# Patient Record
Sex: Female | Born: 1990 | Race: Black or African American | Hispanic: No | Marital: Single | Smoking: Current every day smoker
Health system: Southern US, Community
[De-identification: ages and names within clinical notes are randomized; demographics above are authoritative.]

## PROBLEM LIST (undated history)

## (undated) DIAGNOSIS — F319 Bipolar disorder, unspecified: Secondary | ICD-10-CM

## (undated) DIAGNOSIS — A749 Chlamydial infection, unspecified: Secondary | ICD-10-CM

## (undated) DIAGNOSIS — A549 Gonococcal infection, unspecified: Secondary | ICD-10-CM

## (undated) HISTORY — PX: NO PAST SURGERIES: SHX2092

---

## 2013-10-27 ENCOUNTER — Encounter (HOSPITAL_COMMUNITY): Payer: Self-pay | Admitting: *Deleted

## 2013-10-27 ENCOUNTER — Inpatient Hospital Stay (HOSPITAL_COMMUNITY)
Admission: AD | Admit: 2013-10-27 | Discharge: 2013-10-28 | Disposition: A | Discharge status: Home / Self Care | Payer: Medicaid Other | Source: Ambulatory Visit | Attending: Family Medicine | Admitting: Family Medicine

## 2013-10-27 DIAGNOSIS — O039 Complete or unspecified spontaneous abortion without complication: Secondary | ICD-10-CM | POA: Insufficient documentation

## 2013-10-27 DIAGNOSIS — F17200 Nicotine dependence, unspecified, uncomplicated: Secondary | ICD-10-CM | POA: Insufficient documentation

## 2013-10-27 HISTORY — DX: Gonococcal infection, unspecified: A54.9

## 2013-10-27 HISTORY — DX: Chlamydial infection, unspecified: A74.9

## 2013-10-27 LAB — CBC
HCT: 34.6 % — ABNORMAL LOW (ref 36.0–46.0)
Hemoglobin: 11.7 g/dL — ABNORMAL LOW (ref 12.0–15.0)
MCH: 31.3 pg (ref 26.0–34.0)
MCHC: 33.8 g/dL (ref 30.0–36.0)
MCV: 92.5 fL (ref 78.0–100.0)
PLATELETS: 242 10*3/uL (ref 150–400)
RBC: 3.74 MIL/uL — ABNORMAL LOW (ref 3.87–5.11)
RDW: 13.7 % (ref 11.5–15.5)
WBC: 5.1 10*3/uL (ref 4.0–10.5)

## 2013-10-27 LAB — URINE MICROSCOPIC-ADD ON

## 2013-10-27 LAB — URINALYSIS, ROUTINE W REFLEX MICROSCOPIC
Bilirubin Urine: NEGATIVE
Glucose, UA: NEGATIVE mg/dL
KETONES UR: NEGATIVE mg/dL
LEUKOCYTES UA: NEGATIVE
Nitrite: NEGATIVE
PROTEIN: NEGATIVE mg/dL
Specific Gravity, Urine: >1.03 — ABNORMAL HIGH (ref 1.005–1.030)
UROBILINOGEN UA: 0.2 mg/dL (ref 0.0–1.0)
pH: 6 (ref 5.0–8.0)

## 2013-10-27 LAB — HCG, QUANTITATIVE, PREGNANCY: HCG, BETA CHAIN, QUANT, S: 6 m[IU]/mL — AB (ref <5)

## 2013-10-27 NOTE — MAU Provider Note (Signed)
Chief Complaint: Possible Pregnancy   None    SUBJECTIVE HPI: Lauren Poole is a 23 y.o. G0P0 at Unknown by LMP who presents to maternity admissions reporting positive pregnancy tests at home x3 with last one 2 weeks ago and onset of bleeding 1.5 weeks ago which has now resolved. She reported menstrual-like cramping for the first 2 days of bleeding but denies pain or bleeding today.  She denies vaginal itching/burning, urinary symptoms, h/a, dizziness, n/v, or fever/chills.     Past Medical History  Diagnosis Date  . Chlamydia   . Gonorrhea    Past Surgical History  Procedure Laterality Date  . No past surgeries     History   Social History  . Marital Status: Single    Spouse Name: N/A    Number of Children: N/A  . Years of Education: N/A   Occupational History  . Not on file.   Social History Main Topics  . Smoking status: Light Tobacco Smoker  . Smokeless tobacco: Not on file  . Alcohol Use: Yes     Comment: not since pregnant  . Drug Use: No  . Sexual Activity: Yes    Birth Control/ Protection: None   Other Topics Concern  . Not on file   Social History Narrative  . No narrative on file   No current facility-administered medications on file prior to encounter.   No current outpatient prescriptions on file prior to encounter.   Allergies not on file  ROS: Pertinent items in HPI  OBJECTIVE Blood pressure 119/83, pulse 74, temperature 98.1 F (36.7 C), resp. rate 18, height 5\' 4"  (1.626 m), weight 87.907 kg (193 lb 12.8 oz), last menstrual period 09/06/2013. GENERAL: Well-developed, well-nourished female in no acute distress.  HEENT: Normocephalic HEART: normal rate RESP: normal effort ABDOMEN: Soft, non-tender EXTREMITIES: Nontender, no edema NEURO: Alert and oriented SPECULUM EXAM: declined by pt  LAB RESULTS Results for orders placed during the hospital encounter of 10/27/13 (from the past 24 hour(s))  URINALYSIS, ROUTINE W REFLEX  MICROSCOPIC     Status: Abnormal   Collection Time    10/27/13 10:25 PM      Result Value Ref Range   Color, Urine YELLOW  YELLOW   APPearance CLEAR  CLEAR   Specific Gravity, Urine >1.030 (*) 1.005 - 1.030   pH 6.0  5.0 - 8.0   Glucose, UA NEGATIVE  NEGATIVE mg/dL   Hgb urine dipstick LARGE (*) NEGATIVE   Bilirubin Urine NEGATIVE  NEGATIVE   Ketones, ur NEGATIVE  NEGATIVE mg/dL   Protein, ur NEGATIVE  NEGATIVE mg/dL   Urobilinogen, UA 0.2  0.0 - 1.0 mg/dL   Nitrite NEGATIVE  NEGATIVE   Leukocytes, UA NEGATIVE  NEGATIVE  URINE MICROSCOPIC-ADD ON     Status: Abnormal   Collection Time    10/27/13 10:25 PM      Result Value Ref Range   Squamous Epithelial / LPF FEW (*) RARE   WBC, UA 0-2  <3 WBC/hpf   RBC / HPF 0-2  <3 RBC/hpf   Bacteria, UA RARE  RARE  CBC     Status: Abnormal   Collection Time    10/27/13 10:35 PM      Result Value Ref Range   WBC 5.1  4.0 - 10.5 K/uL   RBC 3.74 (*) 3.87 - 5.11 MIL/uL   Hemoglobin 11.7 (*) 12.0 - 15.0 g/dL   HCT 56.234.6 (*) 13.036.0 - 86.546.0 %   MCV 92.5  78.0 - 100.0  fL   MCH 31.3  26.0 - 34.0 pg   MCHC 33.8  30.0 - 36.0 g/dL   RDW 40.9  81.1 - 91.4 %   Platelets 242  150 - 400 K/uL  ABO/RH     Status: None   Collection Time    10/27/13 10:35 PM      Result Value Ref Range   ABO/RH(D) O POS    HCG, QUANTITATIVE, PREGNANCY     Status: Abnormal   Collection Time    10/27/13 10:35 PM      Result Value Ref Range   hCG, Beta Chain, Quant, S 6 (*) <5 mIU/mL    ASSESSMENT 1. SAB (spontaneous abortion)     PLAN Discharge home F/U scheduled in 1 week in [facility] for repeat quant  hcg Return to MAU as needed    Medication List    Notice   You have not been prescribed any medications.     Follow-up Information   Follow up with [facility] (You have appointment on Friday 11/03/13 at 8:30 am for repeat labs.  Return to MAU as needed.)    Specialty:  Obstetrics and Gynecology   Contact information:   [address] [place] [address] [phone]      Sharen Counter Certified Nurse-Midwife 10/28/2013  12:00 AM

## 2013-10-27 NOTE — Discharge Instructions (Signed)
Miscarriage A miscarriage is the sudden loss of an unborn baby (fetus) before the 20th week of pregnancy. Most miscarriages happen in the first 3 months of pregnancy. Sometimes, it happens before a woman even knows she is pregnant. A miscarriage is also called a "spontaneous miscarriage" or "early pregnancy loss." Having a miscarriage can be an emotional experience. Talk with your caregiver about any questions you may have about miscarrying, the grieving process, and your future pregnancy plans. CAUSES   Problems with the fetal chromosomes that make it impossible for the baby to develop normally. Problems with the baby's genes or chromosomes are most often the result of errors that occur, by chance, as the embryo divides and grows. The problems are not inherited from the parents.  Infection of the cervix or uterus.   Hormone problems.   Problems with the cervix, such as having an incompetent cervix. This is when the tissue in the cervix is not strong enough to hold the pregnancy.   Problems with the uterus, such as an abnormally shaped uterus, uterine fibroids, or congenital abnormalities.   Certain medical conditions.   Smoking, drinking alcohol, or taking illegal drugs.   Trauma.  Often, the cause of a miscarriage is unknown.  SYMPTOMS   Vaginal bleeding or spotting, with or without cramps or pain.  Pain or cramping in the abdomen or lower back.  Passing fluid, tissue, or blood clots from the vagina. DIAGNOSIS  Your caregiver will perform a physical exam. You may also have an ultrasound to confirm the miscarriage. Blood or urine tests may also be ordered. TREATMENT   Sometimes, treatment is not necessary if you naturally pass all the fetal tissue that was in the uterus. If some of the fetus or placenta remains in the body (incomplete miscarriage), tissue left behind may become infected and must be removed. Usually, a dilation and curettage (D and C) procedure is performed.  During a D and C procedure, the cervix is widened (dilated) and any remaining fetal or placental tissue is gently removed from the uterus.  Antibiotic medicines are prescribed if there is an infection. Other medicines may be given to reduce the size of the uterus (contract) if there is a lot of bleeding.  If you have Rh negative blood and your baby was Rh positive, you will need a Rh immunoglobulin shot. This shot will protect any future baby from having Rh blood problems in future pregnancies. HOME CARE INSTRUCTIONS   Your caregiver may order bed rest or may allow you to continue light activity. Resume activity as directed by your caregiver.  Have someone help with home and family responsibilities during this time.   Keep track of the number of sanitary pads you use each day and how soaked (saturated) they are. Write down this information.   Do not use tampons. Do not douche or have sexual intercourse until approved by your caregiver.   Only take over-the-counter or prescription medicines for pain or discomfort as directed by your caregiver.   Do not take aspirin. Aspirin can cause bleeding.   Keep all follow-up appointments with your caregiver.   If you or your partner have problems with grieving, talk to your caregiver or seek counseling to help cope with the pregnancy loss. Allow enough time to grieve before trying to get pregnant again.  SEEK IMMEDIATE MEDICAL CARE IF:   You have severe cramps or pain in your back or abdomen.  You have a fever.  You pass large blood clots (walnut-sized   or larger) ortissue from your vagina. Save any tissue for your caregiver to inspect.   Your bleeding increases.   You have a thick, bad-smelling vaginal discharge.  You become lightheaded, weak, or you faint.   You have chills.  MAKE SURE YOU:  Understand these instructions.  Will watch your condition.  Will get help right away if you are not doing well or get  worse. Document Released: 01/27/2001 Document Revised: 11/28/2012 Document Reviewed: 09/22/2011 Exit[employer] Patient Information 2014 [employer].  

## 2013-10-27 NOTE — MAU Note (Signed)
Found out i was pregnant [redacted] wks ago. Have had bleeding for last wk and half which stopped yesterday. First 2 days of bleeding was painful but no more pain since. Just bleeding which stopped yesterday. So not sure if i'm still pregnant

## 2013-10-28 LAB — ABO/RH: ABO/RH(D): O POS

## 2013-10-28 NOTE — MAU Provider Note (Signed)
Attestation of Attending Supervision of Advanced Practitioner (PA/CNM/NP): Evaluation and management procedures were performed by the Advanced Practitioner under my supervision and collaboration.  I have reviewed the Advanced Practitioner's note and chart, and I agree with the management and plan.  Reva BoresPRATT,Autry Prust S, MD [facility] Attending 10/28/2013 2:16 AM

## 2013-11-03 ENCOUNTER — Telehealth: Payer: Self-pay | Admitting: *Deleted

## 2013-11-03 NOTE — Telephone Encounter (Signed)
Pt missed lab appointment this am, called and left message that she missed appt with lab and we could still get her blood work if she could arrive in the next one hour, if not she could go to MAU for the blood work.  Asked patient to call nurse line and leave a message as to her plan.

## 2013-11-05 ENCOUNTER — Inpatient Hospital Stay (HOSPITAL_COMMUNITY)
Admission: AD | Admit: 2013-11-05 | Discharge: 2013-11-05 | Disposition: A | Discharge status: Home / Self Care | Payer: Medicaid Other | Source: Ambulatory Visit | Attending: Obstetrics and Gynecology | Admitting: Obstetrics and Gynecology

## 2013-11-05 ENCOUNTER — Encounter (HOSPITAL_COMMUNITY): Payer: Self-pay | Admitting: *Deleted

## 2013-11-05 DIAGNOSIS — O039 Complete or unspecified spontaneous abortion without complication: Secondary | ICD-10-CM

## 2013-11-05 DIAGNOSIS — F17200 Nicotine dependence, unspecified, uncomplicated: Secondary | ICD-10-CM | POA: Insufficient documentation

## 2013-11-05 LAB — HCG, QUANTITATIVE, PREGNANCY: hCG, Beta Chain, Quant, S: 1 m[IU]/mL (ref <5)

## 2013-11-05 NOTE — MAU Provider Note (Signed)
Attestation of Attending Supervision of Advanced Practitioner: Evaluation and management procedures were performed by the PA/NP/CNM/OB Fellow under my supervision/collaboration. Chart reviewed and agree with management and plan.  Marian Meneely V 11/05/2013 11:29 PM

## 2013-11-05 NOTE — MAU Note (Signed)
Pt in for follow up BHCG, denies any pain or bleeding.

## 2013-11-05 NOTE — MAU Provider Note (Signed)
  History     CSN: [id number]  Arrival date and time: 11/05/13 2154   None     Chief Complaint  Patient presents with  . Follow-up   HPI  Lauren Poole is a 23 y.o. who is here for FU HCG today. She was seen on 10/27/13, and had HCG of 6. She was given an outpatient lab appointment for 11/03/13 to FU on the HCG. She was unable to make that appointment, and was instructed to return here for FU HCG. She denies any pain at this time, and is not having any further bleeding.   Past Medical History  Diagnosis Date  . Chlamydia   . Gonorrhea     Past Surgical History  Procedure Laterality Date  . No past surgeries      History reviewed. No pertinent family history.  History  Substance Use Topics  . Smoking status: Light Tobacco Smoker  . Smokeless tobacco: Not on file  . Alcohol Use: Yes     Comment: not since pregnant    Allergies: Allergies not on file  No prescriptions prior to admission    ROS Physical Exam   Blood pressure 118/80, pulse 92, temperature 98.2 F (36.8 C), temperature source Oral, resp. rate 16, height 5\' 4"  (1.626 m), weight 87.091 kg (192 lb), last menstrual period 09/06/2013.  Physical Exam  Nursing note and vitals reviewed. Constitutional: She is oriented to person, place, and time. She appears well-developed and well-nourished. No distress.  Cardiovascular: Normal rate.   Respiratory: Effort normal.  GI: Soft. There is no tenderness.  Neurological: She is alert and oriented to person, place, and time.  Skin: Skin is warm and dry.  Psychiatric: She has a normal mood and affect.    MAU Course  Procedures  Results for Lauren Poole, Lauren (MRN [id number]) as of 11/05/2013 22:51  Ref. Range 10/27/2013 22:35 11/05/2013 22:15  hCG, Beta Chain, Quant, S Latest Range: <5 mIU/mL 6 (H) 1    Assessment and Plan   1. SAB (spontaneous abortion)    Return to MAU as needed No further follow-up required at this time    Tawnya CrookHogan, Heather  Donovan 11/05/2013, 10:51 PM

## 2013-11-05 NOTE — Discharge Instructions (Signed)
Miscarriage  A miscarriage is the loss of an unborn baby (fetus) before the 20th week of pregnancy. The cause is often unknown.   HOME CARE  · You may need to stay in bed (bed rest), or you may be able to do light activity. Go about activity as told by your doctor.  · Have help at home.  · Write down how many pads you use each day. Write down how soaked they are.  · Do not use tampons. Do not wash out your vagina (douche) or have sex (intercourse) until your doctor approves.  · Only take medicine as told by your doctor.  · Do not take aspirin.  · Keep all doctor visits as told.  · If you or your partner have problems with grieving, talk to your doctor. You can also try counseling. Give yourself time to grieve before trying to get pregnant again.  GET HELP RIGHT AWAY IF:  · You have bad cramps or pain in your back or belly (abdomen).  · You have a fever.  · You pass large clumps of blood (clots) from your vagina that are walnut-sized or larger. Save the clumps for your doctor to see.  · You pass large amounts of tissue from your vagina. Save the tissue for your doctor to see.  · You have more bleeding.  · You have thick, bad-smelling fluid (discharge) coming from the vagina.  · You get lightheaded, weak, or you pass out (faint).  · You have chills.  MAKE SURE YOU:  · Understand these instructions.  · Will watch your condition.  · Will get help right away if you are not doing well or get worse.  Document Released: 10/26/2011 Document Reviewed: 10/26/2011  Exit[employer]® Patient Information ©2014 [employer].

## 2013-11-05 NOTE — MAU Note (Signed)
Zorita PangH. Hogan CNM in to discuss results with pt.

## 2013-11-10 ENCOUNTER — Other Ambulatory Visit: Payer: Medicaid Other

## 2014-06-18 ENCOUNTER — Encounter (HOSPITAL_COMMUNITY): Payer: Self-pay | Admitting: *Deleted

## 2014-07-07 ENCOUNTER — Encounter (HOSPITAL_COMMUNITY): Payer: Self-pay | Admitting: *Deleted

## 2014-07-07 ENCOUNTER — Inpatient Hospital Stay (HOSPITAL_COMMUNITY)
Admission: AD | Admit: 2014-07-07 | Discharge: 2014-07-07 | Disposition: A | Discharge status: Home / Self Care | Payer: Medicaid Other | Source: Ambulatory Visit | Attending: Obstetrics and Gynecology | Admitting: Obstetrics and Gynecology

## 2014-07-07 DIAGNOSIS — R1010 Upper abdominal pain, unspecified: Secondary | ICD-10-CM

## 2014-07-07 DIAGNOSIS — R1011 Right upper quadrant pain: Secondary | ICD-10-CM | POA: Insufficient documentation

## 2014-07-07 DIAGNOSIS — G8929 Other chronic pain: Secondary | ICD-10-CM

## 2014-07-07 LAB — URINALYSIS, ROUTINE W REFLEX MICROSCOPIC
Bilirubin Urine: NEGATIVE
Glucose, UA: NEGATIVE mg/dL
Ketones, ur: NEGATIVE mg/dL
Leukocytes, UA: NEGATIVE
NITRITE: NEGATIVE
Protein, ur: NEGATIVE mg/dL
SPECIFIC GRAVITY, URINE: 1.025 (ref 1.005–1.030)
Urobilinogen, UA: 0.2 mg/dL (ref 0.0–1.0)
pH: 5.5 (ref 5.0–8.0)

## 2014-07-07 LAB — URINE MICROSCOPIC-ADD ON

## 2014-07-07 LAB — POCT PREGNANCY, URINE: PREG TEST UR: NEGATIVE

## 2014-07-07 NOTE — MAU Note (Signed)
Patient presents with complaint of lower abdominal pain X 11/2 months.

## 2014-07-07 NOTE — Discharge Instructions (Signed)
You can try Tums for your pain.   Avoid high fat foods.  Monitor to see if high fat makes your abdominal pain worse. Make an appointment with a family practice doctor for further evaluation of your pain.   Use condoms with every intercourse for pregnancy protection.

## 2014-07-07 NOTE — MAU Provider Note (Signed)
  History     CSN: [id number]  Arrival date and time: 07/07/14 1755   First Provider Initiated Contact with Patient 07/07/14 1814      Chief Complaint  Patient presents with  . Abdominal Pain   HPI  Lauren Poole 23 y.o. Comes to MAU with right upper quadrant pain for 1.5 months.  Today she had chinese food which included some fried foods.  Noticed after eating that the right upper quadrant pain was worse.  Uses condoms sometimes.  Does not want "birth control" as she does not like the way it makes her feel.  [facility] a [facility].  Denies any nausea or vomiting.  Has not used any medication for her pain.  Denies having any problems with vaginal discharge.  No vaginal bleeding.  No dysuria.  OB History    Gravida Para Term Preterm AB TAB SAB Ectopic Multiple Living   1    1  1          Past Medical History  Diagnosis Date  . Chlamydia   . Gonorrhea     Past Surgical History  Procedure Laterality Date  . No past surgeries      History reviewed. No pertinent family history.  History  Substance Use Topics  . Smoking status: Light Tobacco Smoker  . Smokeless tobacco: Never Used  . Alcohol Use: Yes     Comment: not since pregnant    Allergies: Allergies not on file  No prescriptions prior to admission    Review of Systems  Constitutional: Negative for fever.  Gastrointestinal: Positive for abdominal pain. Negative for nausea, vomiting, diarrhea and constipation.  Genitourinary:       No vaginal discharge. No vaginal bleeding. No dysuria.   Physical Exam   Blood pressure 124/77, pulse 68, temperature 98 F (36.7 C), temperature source Oral, resp. rate 16, height 5' 4.5" (1.638 m), weight 209 lb (94.802 kg), last menstrual period 06/17/2014.  Physical Exam  Nursing note and vitals reviewed. Constitutional: She is oriented to person, place, and time. She appears well-developed and well-nourished. No distress.  HENT:  Head: Normocephalic.   Eyes: EOM are normal.  Neck: Neck supple.  GI: Soft. There is tenderness. There is no rebound and no guarding.  Tender in right upper quadrant  Musculoskeletal: Normal range of motion.  Neurological: She is alert and oriented to person, place, and time.  Skin: Skin is warm and dry.  Psychiatric: She has a normal mood and affect.    MAU Course  Procedures  MDM Explained that RUQ pain which has been occuring for 1.5 months may need more evaluation that we can give in this setting.  Advised she will need a primary care doctor to help her evaluate this pain.  Client was relieved to know that her pregnancy test is negative tonight.  Explained that it is unlikely that a GYN problem is causing this pain.  Advised she may have gallbladder issues with pain that worsens after eating fried foods.  Since she is having no nausea and vomiting, this may not be severe at this time.  Client declines to have blood drawn at this time.  Wants to go home now.  Is in no distress at this time.  Assessment and Plan  Chronic right upper quadrant pain  Plan Make an appointment with primary care or family practice for further evaluation. Avoid fried foods. Use condoms always for pregnancy prevention.  BURLESON,TERRI 07/07/2014, 6:20 PM

## 2014-08-06 ENCOUNTER — Telehealth (HOSPITAL_COMMUNITY): Payer: Self-pay | Admitting: *Deleted

## 2014-08-06 NOTE — Telephone Encounter (Signed)
Telephoned patient at home # and left message to return call to [facility] 

## 2014-08-21 ENCOUNTER — Other Ambulatory Visit (HOSPITAL_COMMUNITY): Payer: Self-pay | Admitting: *Deleted

## 2014-08-21 DIAGNOSIS — N6320 Unspecified lump in the left breast, unspecified quadrant: Secondary | ICD-10-CM

## 2014-09-03 ENCOUNTER — Encounter (HOSPITAL_COMMUNITY): Payer: Self-pay | Admitting: *Deleted

## 2014-09-05 ENCOUNTER — Other Ambulatory Visit: Payer: Medicaid Other

## 2014-09-05 ENCOUNTER — Ambulatory Visit (HOSPITAL_COMMUNITY): Payer: Medicaid Other | Attending: Obstetrics and Gynecology

## 2014-09-10 ENCOUNTER — Telehealth (HOSPITAL_COMMUNITY): Payer: Self-pay | Admitting: *Deleted

## 2014-09-10 NOTE — Telephone Encounter (Signed)
Telephoned patient at home # and left message to return call to [facility] 

## 2016-05-19 ENCOUNTER — Inpatient Hospital Stay (HOSPITAL_COMMUNITY)
Admission: AD | Admit: 2016-05-19 | Discharge: 2016-05-19 | Disposition: A | Discharge status: Home / Self Care | Payer: Self-pay | Source: Ambulatory Visit | Attending: Obstetrics and Gynecology | Admitting: Obstetrics and Gynecology

## 2016-05-19 ENCOUNTER — Inpatient Hospital Stay (HOSPITAL_COMMUNITY): Payer: Self-pay

## 2016-05-19 ENCOUNTER — Encounter (HOSPITAL_COMMUNITY): Payer: Self-pay | Admitting: *Deleted

## 2016-05-19 ENCOUNTER — Encounter: Payer: Self-pay | Admitting: Obstetrics and Gynecology

## 2016-05-19 DIAGNOSIS — F17200 Nicotine dependence, unspecified, uncomplicated: Secondary | ICD-10-CM | POA: Insufficient documentation

## 2016-05-19 DIAGNOSIS — N939 Abnormal uterine and vaginal bleeding, unspecified: Secondary | ICD-10-CM

## 2016-05-19 LAB — WET PREP, GENITAL
Clue Cells Wet Prep HPF POC: NONE SEEN
SPERM: NONE SEEN
Trich, Wet Prep: NONE SEEN
WBC, Wet Prep HPF POC: NONE SEEN
Yeast Wet Prep HPF POC: NONE SEEN

## 2016-05-19 LAB — POCT PREGNANCY, URINE: Preg Test, Ur: NEGATIVE

## 2016-05-19 LAB — CBC
HCT: 26.4 % — ABNORMAL LOW (ref 36.0–46.0)
Hemoglobin: 9.1 g/dL — ABNORMAL LOW (ref 12.0–15.0)
MCH: 30.7 pg (ref 26.0–34.0)
MCHC: 34.5 g/dL (ref 30.0–36.0)
MCV: 89.2 fL (ref 78.0–100.0)
PLATELETS: 237 10*3/uL (ref 150–400)
RBC: 2.96 MIL/uL — ABNORMAL LOW (ref 3.87–5.11)
RDW: 13.6 % (ref 11.5–15.5)
WBC: 5.1 10*3/uL (ref 4.0–10.5)

## 2016-05-19 LAB — RPR: RPR Ser Ql: NONREACTIVE

## 2016-05-19 LAB — GC/CHLAMYDIA PROBE AMP (~~LOC~~) NOT AT ARMC
Chlamydia: NEGATIVE
Neisseria Gonorrhea: NEGATIVE

## 2016-05-19 LAB — URINALYSIS, ROUTINE W REFLEX MICROSCOPIC
GLUCOSE, UA: NEGATIVE mg/dL
Ketones, ur: 15 mg/dL — AB
Leukocytes, UA: NEGATIVE
Nitrite: NEGATIVE
PH: 6 (ref 5.0–8.0)
Protein, ur: 30 mg/dL — AB
Specific Gravity, Urine: 1.025 (ref 1.005–1.030)

## 2016-05-19 LAB — URINE MICROSCOPIC-ADD ON

## 2016-05-19 LAB — HIV ANTIBODY (ROUTINE TESTING W REFLEX): HIV Screen 4th Generation wRfx: NONREACTIVE

## 2016-05-19 MED ORDER — NORGESTIMATE-ETH ESTRADIOL 0.25-35 MG-MCG PO TABS: 1.0000 | ORAL_TABLET | Freq: Every day | ORAL | 11 refills | Status: DC

## 2016-05-19 NOTE — MAU Note (Signed)
Pt reports she has been bleeding for 6-8 weeks, started as spotting for the first month but the last 4 weeks it has been very heavy

## 2016-05-19 NOTE — MAU Provider Note (Signed)
History     CSN: [id number]  Arrival date and time: 05/19/16 0025   First Provider Initiated Contact with Patient 05/19/16 0138      Chief Complaint  Patient presents with  . Vaginal Bleeding   Lauren Poole is a 25 y.o. G1P0010 who presents today with vaginal bleeding. She states that she did not have a period in June or July, and then in August she started bleeding. She has been bleeding every day since. She states that it started out as spotting, but has gotten heavier.    Vaginal Bleeding  The patient's primary symptoms include vaginal bleeding. This is a new problem. Episode onset: The beginning of August.  The problem occurs constantly. The problem has been gradually worsening. The patient is experiencing no pain. She is not pregnant. Associated symptoms include abdominal pain. Pertinent negatives include no chills, constipation, diarrhea, dysuria, fever, frequency, nausea, urgency or vomiting. The vaginal bleeding is heavier than menses. She has been passing clots (about the size of a quarter. ). She has not been passing tissue. Nothing aggravates the symptoms. She has tried nothing for the symptoms. She is not sexually active. She uses nothing for contraception.    Past Medical History:  Diagnosis Date  . Chlamydia   . Gonorrhea     Past Surgical History:  Procedure Laterality Date  . NO PAST SURGERIES      History reviewed. No pertinent family history.  Social History  Substance Use Topics  . Smoking status: Light Tobacco Smoker  . Smokeless tobacco: Never Used  . Alcohol use Yes     Comment: not since pregnant    Allergies: No Known Allergies  No prescriptions prior to admission.    Review of Systems  Constitutional: Negative for chills and fever.  Gastrointestinal: Positive for abdominal pain. Negative for constipation, diarrhea, nausea and vomiting.  Genitourinary: Positive for vaginal bleeding. Negative for dysuria, frequency and urgency.    Physical Exam   Blood pressure 119/83, pulse 101, temperature 98.2 F (36.8 C), temperature source Oral, resp. rate 16, height 5\' 4"  (1.626 m), weight 223 lb (101.2 kg), last menstrual period 03/17/2016, SpO2 98 %.  Physical Exam  Nursing note and vitals reviewed. Constitutional: She is oriented to person, place, and time. She appears well-developed and well-nourished. No distress.  HENT:  Head: Normocephalic.  Cardiovascular: Normal rate.   Respiratory: Effort normal.  GI: Soft. There is no tenderness. There is no rebound.  Genitourinary:  Genitourinary Comments:  External: no lesion Vagina: small amount of blood seen  Cervix: pink, smooth, no CMT Uterus: NSSC Adnexa: NT   Neurological: She is alert and oriented to person, place, and time.  Skin: Skin is warm and dry.  Psychiatric: She has a normal mood and affect.     Results for orders placed or performed during the hospital encounter of 05/19/16 (from the past 24 hour(s))  Urinalysis, Routine w reflex microscopic (not at [facility])     Status: Abnormal   Collection Time: 05/19/16 12:40 AM  Result Value Ref Range   Color, Urine YELLOW YELLOW   APPearance CLEAR CLEAR   Specific Gravity, Urine 1.025 1.005 - 1.030   pH 6.0 5.0 - 8.0   Glucose, UA NEGATIVE NEGATIVE mg/dL   Hgb urine dipstick LARGE (A) NEGATIVE   Bilirubin Urine SMALL (A) NEGATIVE   Ketones, ur 15 (A) NEGATIVE mg/dL   Protein, ur 30 (A) NEGATIVE mg/dL   Nitrite NEGATIVE NEGATIVE   Leukocytes, UA NEGATIVE NEGATIVE  Urine  microscopic-add on     Status: Abnormal   Collection Time: 05/19/16 12:40 AM  Result Value Ref Range   Squamous Epithelial / LPF 0-5 (A) NONE SEEN   WBC, UA 0-5 0 - 5 WBC/hpf   RBC / HPF 6-30 0 - 5 RBC/hpf   Bacteria, UA RARE (A) NONE SEEN   Urine-Other MUCOUS PRESENT   Pregnancy, urine POC     Status: None   Collection Time: 05/19/16  1:00 AM  Result Value Ref Range   Preg Test, Ur NEGATIVE NEGATIVE  CBC     Status: Abnormal    Collection Time: 05/19/16  1:15 AM  Result Value Ref Range   WBC 5.1 4.0 - 10.5 K/uL   RBC 2.96 (L) 3.87 - 5.11 MIL/uL   Hemoglobin 9.1 (L) 12.0 - 15.0 g/dL   HCT 16.1 (L) 09.6 - 04.5 %   MCV 89.2 78.0 - 100.0 fL   MCH 30.7 26.0 - 34.0 pg   MCHC 34.5 30.0 - 36.0 g/dL   RDW 40.9 81.1 - 91.4 %   Platelets 237 150 - 400 K/uL  Wet prep, genital     Status: None   Collection Time: 05/19/16  1:56 AM  Result Value Ref Range   Yeast Wet Prep HPF POC NONE SEEN NONE SEEN   Trich, Wet Prep NONE SEEN NONE SEEN   Clue Cells Wet Prep HPF POC NONE SEEN NONE SEEN   WBC, Wet Prep HPF POC NONE SEEN NONE SEEN   Sperm NONE SEEN    US Transvaginal Non-ob  Result Date: 05/19/2016 CLINICAL DATA:  Abnormal uterine bleeding for 8 weeks.  Anemia. EXAM: TRANSABDOMINAL AND TRANSVAGINAL ULTRASOUND OF PELVIS TECHNIQUE: Both transabdominal and transvaginal ultrasound examinations of the pelvis were performed. Transabdominal technique was performed for global imaging of the pelvis including uterus, ovaries, adnexal regions, and pelvic cul-de-sac. It was necessary to proceed with endovaginal exam following the transabdominal exam to visualize the endometrium and ovaries. COMPARISON:  None FINDINGS: Uterus Measurements: 6.3 x 4.4 x 4.8 cm. Uterus appears retroverted. No fibroids or other mass visualized. Endometrium Thickness: 18 mm.  No focal abnormality visualized. Right ovary Measurements: 3.6 x 2.4 x 2.7 cm. Normal appearance/no adnexal mass. Left ovary Measurements: 4 x 2.1 x 3.1 cm. Normal appearance/no adnexal mass. Other findings No abnormal free fluid. IMPRESSION: Endometrium is mildly thickened at 18 mm. No focal lesions identified. If bleeding remains unresponsive to hormonal or medical therapy, focal lesion work-up with sonohysterogram should be considered. Endometrial biopsy should also be considered in pre-menopausal patients at high risk for endometrial carcinoma. (Ref: Radiological Reasoning: Algorithmic Workup  of Abnormal Vaginal Bleeding with Endovaginal Sonography and Sonohysterography. AJR 2008; 782:N56-21) Electronically Signed   By: Burman Nieves M.D.   On: 05/19/2016 02:43   US Pelvis Complete  Result Date: 05/19/2016 CLINICAL DATA:  Abnormal uterine bleeding for 8 weeks.  Anemia. EXAM: TRANSABDOMINAL AND TRANSVAGINAL ULTRASOUND OF PELVIS TECHNIQUE: Both transabdominal and transvaginal ultrasound examinations of the pelvis were performed. Transabdominal technique was performed for global imaging of the pelvis including uterus, ovaries, adnexal regions, and pelvic cul-de-sac. It was necessary to proceed with endovaginal exam following the transabdominal exam to visualize the endometrium and ovaries. COMPARISON:  None FINDINGS: Uterus Measurements: 6.3 x 4.4 x 4.8 cm. Uterus appears retroverted. No fibroids or other mass visualized. Endometrium Thickness: 18 mm.  No focal abnormality visualized. Right ovary Measurements: 3.6 x 2.4 x 2.7 cm. Normal appearance/no adnexal mass. Left ovary Measurements: 4 x 2.1 x  3.1 cm. Normal appearance/no adnexal mass. Other findings No abnormal free fluid. IMPRESSION: Endometrium is mildly thickened at 18 mm. No focal lesions identified. If bleeding remains unresponsive to hormonal or medical therapy, focal lesion work-up with sonohysterogram should be considered. Endometrial biopsy should also be considered in pre-menopausal patients at high risk for endometrial carcinoma. (Ref: Radiological Reasoning: Algorithmic Workup of Abnormal Vaginal Bleeding with Endovaginal Sonography and Sonohysterography. AJR 2008; 161:W96-04; 191:S68-73) Electronically Signed   By: Burman NievesWilliam  Stevens M.D.   On: 05/19/2016 02:43   MAU Course  Procedures  MDM   Assessment and Plan   1. Abnormal uterine bleeding (AUB)    DC home Comfort measures reviewed  Bleeding precautions RX: sprintec 28 # 1 with 11 refills  Return to MAU as needed FU with OB as planned  Follow-up Information    [facility]   Specialty:  Obstetrics and Gynecology Why:  they will call you with an appointment to be seen in about 3 months  Contact information: [address] [place] [id number] [phone]           Tawnya CrookHogan, Heather Donovan 05/19/2016, 1:44 AM

## 2016-05-19 NOTE — Discharge Instructions (Signed)

## 2016-05-20 LAB — URINE CULTURE

## 2016-07-08 ENCOUNTER — Encounter: Payer: Medicaid Other | Admitting: Obstetrics & Gynecology

## 2016-10-03 ENCOUNTER — Encounter (HOSPITAL_COMMUNITY): Payer: Self-pay

## 2016-10-03 ENCOUNTER — Inpatient Hospital Stay (HOSPITAL_COMMUNITY)
Admission: AD | Admit: 2016-10-03 | Discharge: 2016-10-03 | Disposition: A | Discharge status: Home / Self Care | Payer: Medicaid Other | Source: Ambulatory Visit | Attending: Obstetrics and Gynecology | Admitting: Obstetrics and Gynecology

## 2016-10-03 DIAGNOSIS — B373 Candidiasis of vulva and vagina: Secondary | ICD-10-CM | POA: Insufficient documentation

## 2016-10-03 DIAGNOSIS — B372 Candidiasis of skin and nail: Secondary | ICD-10-CM

## 2016-10-03 DIAGNOSIS — Z3202 Encounter for pregnancy test, result negative: Secondary | ICD-10-CM | POA: Insufficient documentation

## 2016-10-03 LAB — URINALYSIS, ROUTINE W REFLEX MICROSCOPIC
Bilirubin Urine: NEGATIVE
Glucose, UA: NEGATIVE mg/dL
Hgb urine dipstick: NEGATIVE
KETONES UR: NEGATIVE mg/dL
LEUKOCYTES UA: NEGATIVE
NITRITE: NEGATIVE
PROTEIN: NEGATIVE mg/dL
Specific Gravity, Urine: 1.024 (ref 1.005–1.030)
pH: 5 (ref 5.0–8.0)

## 2016-10-03 LAB — WET PREP, GENITAL
CLUE CELLS WET PREP: NONE SEEN
Sperm: NONE SEEN
Trich, Wet Prep: NONE SEEN
Yeast Wet Prep HPF POC: NONE SEEN

## 2016-10-03 LAB — POCT PREGNANCY, URINE: PREG TEST UR: NEGATIVE

## 2016-10-03 MED ORDER — TRIAMCINOLONE ACETONIDE 0.1 % EX CREA: 1.0000 "application " | TOPICAL_CREAM | Freq: Two times a day (BID) | CUTANEOUS | 0 refills | Status: DC

## 2016-10-03 MED ORDER — NYSTATIN 100000 UNIT/GM EX CREA: TOPICAL_CREAM | CUTANEOUS | 0 refills | Status: DC

## 2016-10-03 NOTE — MAU Note (Addendum)
After sexual  Intercourse, her vag becomes super inflamed.  Started the last day of her cycle. First time was 3 wks ago.  Makes sex really uncomfortable.  Is burning.  Doesn't last all day, only sex is causing the discomfort.  No new partner, did switch soap recently

## 2016-10-03 NOTE — MAU Provider Note (Signed)
History     CSN: [id number]  Arrival date and time: 10/03/16 1542   First Provider Initiated Contact with Patient 10/03/16 1713      No chief complaint on file.  HPI  Ms. Lauren Poole is a 26 y.o. G1P0010 at Unknown who presents to MAU today with complaint of vaginal pain x 2-3 weeks. She states pain is worse after intercourse. She denies vaginal bleeding, UTI symptoms, abdominal pain, fever today. She has had some increased in white discharge noted. She is sexually active with 1 partner x 3 months.    OB History    Gravida Para Term Preterm AB Living   1       1     SAB TAB Ectopic Multiple Live Births   1              Past Medical History:  Diagnosis Date  . Chlamydia   . Gonorrhea     Past Surgical History:  Procedure Laterality Date  . NO PAST SURGERIES      No family history on file.  Social History  Substance Use Topics  . Smoking status: Light Tobacco Smoker  . Smokeless tobacco: Never Used  . Alcohol use Yes     Comment: not since pregnant    Allergies: No Known Allergies  No prescriptions prior to admission.    Review of Systems  Constitutional: Negative for fever.  Gastrointestinal: Negative for abdominal pain, constipation, diarrhea, nausea and vomiting.  Genitourinary: Positive for vaginal discharge. Negative for dysuria, frequency, urgency and vaginal bleeding.   Physical Exam   Blood pressure 128/84, pulse 87, temperature 99 F (37.2 C), temperature source Oral, resp. rate 18, weight 224 lb 12.8 oz (102 kg), last menstrual period 09/05/2016, SpO2 100 %.  Physical Exam  Nursing note and vitals reviewed. Constitutional: She is oriented to person, place, and time. She appears well-developed and well-nourished. No distress.  HENT:  Head: Normocephalic and atraumatic.  Cardiovascular: Normal rate.   Respiratory: Effort normal.  GI: Soft. She exhibits no distension and no mass. There is no tenderness. There is no rebound and no  guarding.  Genitourinary: There is rash (mild erythema) on the right labia. There is rash (mild erythema) on the left labia. Uterus is not enlarged and not tender. Cervix exhibits no motion tenderness, no discharge and no friability. Right adnexum displays no mass and no tenderness. Left adnexum displays no mass and no tenderness. No bleeding in the vagina. Vaginal discharge (scant mucusy discharge) found.  Neurological: She is alert and oriented to person, place, and time.  Skin: Skin is warm and dry. No erythema.  Psychiatric: She has a normal mood and affect.    Results for orders placed or performed during the hospital encounter of 10/03/16 (from the past 24 hour(s))  Urinalysis, Routine w reflex microscopic     Status: None   Collection Time: 10/03/16  4:11 PM  Result Value Ref Range   Color, Urine YELLOW YELLOW   APPearance CLEAR CLEAR   Specific Gravity, Urine 1.024 1.005 - 1.030   pH 5.0 5.0 - 8.0   Glucose, UA NEGATIVE NEGATIVE mg/dL   Hgb urine dipstick NEGATIVE NEGATIVE   Bilirubin Urine NEGATIVE NEGATIVE   Ketones, ur NEGATIVE NEGATIVE mg/dL   Protein, ur NEGATIVE NEGATIVE mg/dL   Nitrite NEGATIVE NEGATIVE   Leukocytes, UA NEGATIVE NEGATIVE  Pregnancy, urine POC     Status: None   Collection Time: 10/03/16  4:27 PM  Result Value Ref Range  Preg Test, Ur NEGATIVE NEGATIVE  Wet prep, genital     Status: Abnormal   Collection Time: 10/03/16  5:21 PM  Result Value Ref Range   Yeast Wet Prep HPF POC NONE SEEN NONE SEEN   Trich, Wet Prep NONE SEEN NONE SEEN   Clue Cells Wet Prep HPF POC NONE SEEN NONE SEEN   WBC, Wet Prep HPF POC FEW (A) NONE SEEN   Sperm NONE SEEN     MAU Course  Procedures None  MDM UPT - negative UA, wet prep, GC/Chlmydia, HIV and RPR today   Assessment and Plan  A: Cutaneous candidiasis, bilateral labia  P: Discharge home Rx for Triamcinolone and Nystatin given to patient  Warning signs for worsening condition discussed Patient advised  to follow-up with [facility] as needed for routine care Patient may return to MAU as needed or if her condition were to change or worsen  Marny LowensteinJulie N Wenzel, PA-C  10/03/2016, 6:36 PM

## 2016-10-03 NOTE — Discharge Instructions (Signed)
Skin Yeast Infection Skin yeast infection is a condition in which there is an overgrowth of yeast (candida) that normally lives on the skin. This condition usually occurs in areas of the skin that are constantly warm and moist, such as the armpits or the groin. What are the causes? This condition is caused by a change in the normal balance of the yeast and bacteria that live on the skin. What increases the risk? This condition is more likely to develop in:  People who are obese.  Pregnant women.  Women who take birth control pills.  People who have diabetes.  People who take antibiotic medicines.  People who take steroid medicines.  People who are malnourished.  People who have a weak defense (immune) system.  People who are 65 years of age or older. What are the signs or symptoms? Symptoms of this condition include:  A red, swollen area of the skin.  Bumps on the skin.  Itchiness. How is this diagnosed? This condition is diagnosed with a medical history and physical exam. Your health care provider may check for yeast by taking light scrapings of the skin to be viewed under a microscope. How is this treated? This condition is treated with medicine. Medicines may be prescribed or be available over-the-counter. The medicines may be:  Taken by mouth (orally).  Applied as a cream. Follow these instructions at home:  Take or apply over-the-counter and prescription medicines only as told by your health care provider.  Eat more yogurt. This may help to keep your yeast infection from returning.  Maintain a healthy weight. If you need help losing weight, talk with your health care provider.  Keep your skin clean and dry.  If you have diabetes, keep your blood sugar under control. Contact a health care provider if:  Your symptoms go away and then return.  Your symptoms do not get better with treatment.  Your symptoms get worse.  Your rash spreads.  You have a fever  or chills.  You have new symptoms.  You have new warmth or redness of your skin. This information is not intended to replace advice given to you by your health care provider. Make sure you discuss any questions you have with your health care provider. Document Released: 04/21/2011 Document Revised: 03/29/2016 Document Reviewed: 02/04/2015 Elsevier Interactive Patient Education  2017 [employer]  

## 2016-10-04 LAB — RPR: RPR Ser Ql: NONREACTIVE

## 2016-10-04 LAB — HIV ANTIBODY (ROUTINE TESTING W REFLEX): HIV Screen 4th Generation wRfx: NONREACTIVE

## 2016-10-05 LAB — GC/CHLAMYDIA PROBE AMP (~~LOC~~) NOT AT ARMC
Chlamydia: NEGATIVE
Neisseria Gonorrhea: NEGATIVE

## 2017-07-17 ENCOUNTER — Other Ambulatory Visit: Payer: Self-pay | Admitting: Advanced Practice Midwife

## 2017-08-24 IMAGING — US US PELVIS COMPLETE
1 series · 15 of 25 positions shown · non-contrast
Comparison: None

CLINICAL DATA: Abnormal uterine bleeding for 8 weeks.  Anemia.

EXAM:
TRANSABDOMINAL AND TRANSVAGINAL ULTRASOUND OF PELVIS
TECHNIQUE: Both transabdominal and transvaginal ultrasound examinations of the
pelvis were performed. Transabdominal technique was performed for
global imaging of the pelvis including uterus, ovaries, adnexal
regions, and pelvic cul-de-sac. It was necessary to proceed with
endovaginal exam following the transabdominal exam to visualize the
endometrium and ovaries.

[Series 1: us pelvis complete · 15 of 62 slices shown]
[im 1/62]
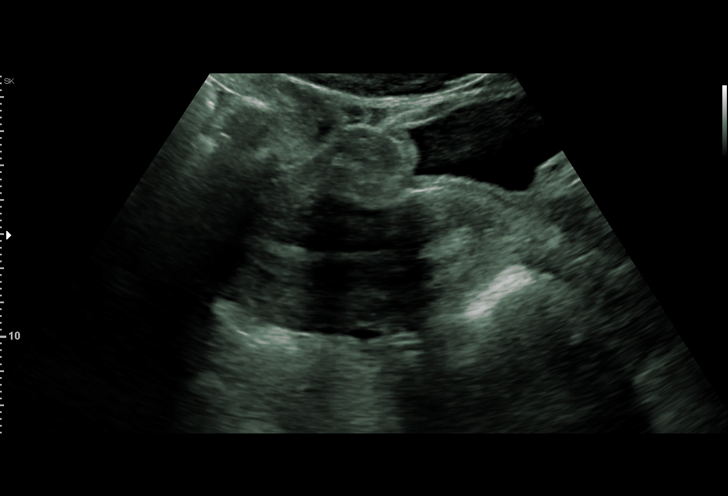
[im 6/62]
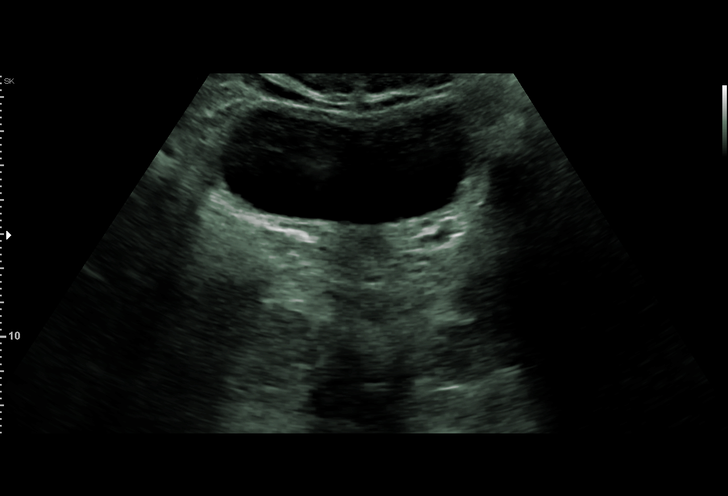
[im 11/62]
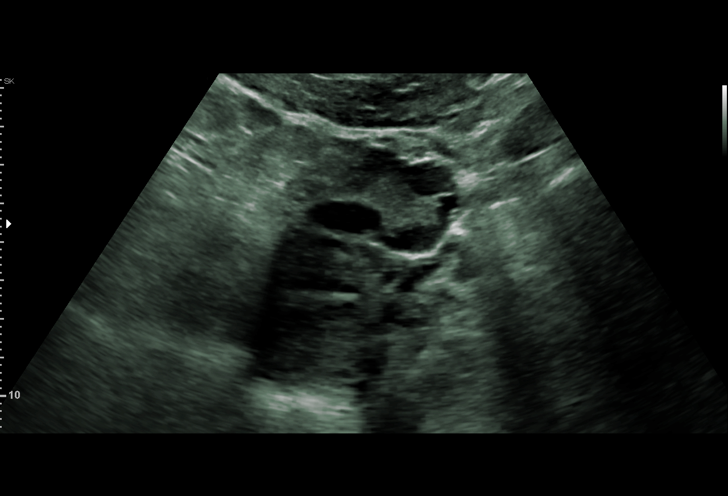
[im 13/62]
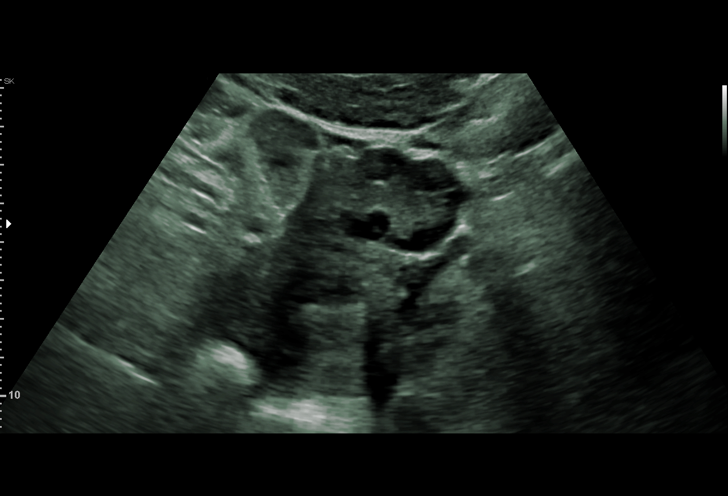
[im 18/62]
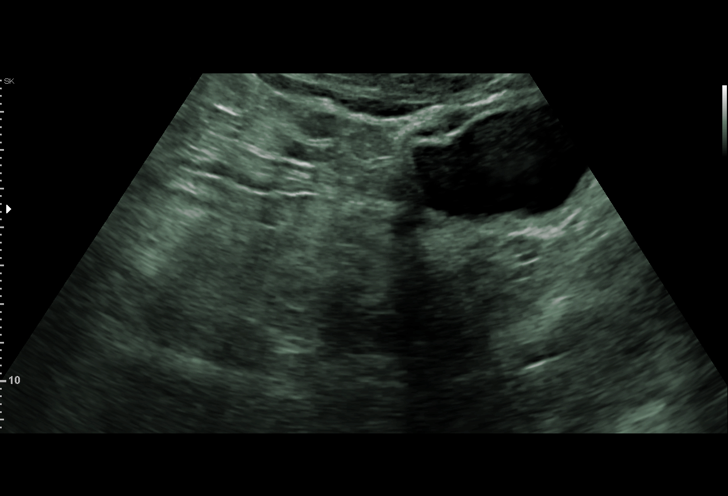
[im 23/62]
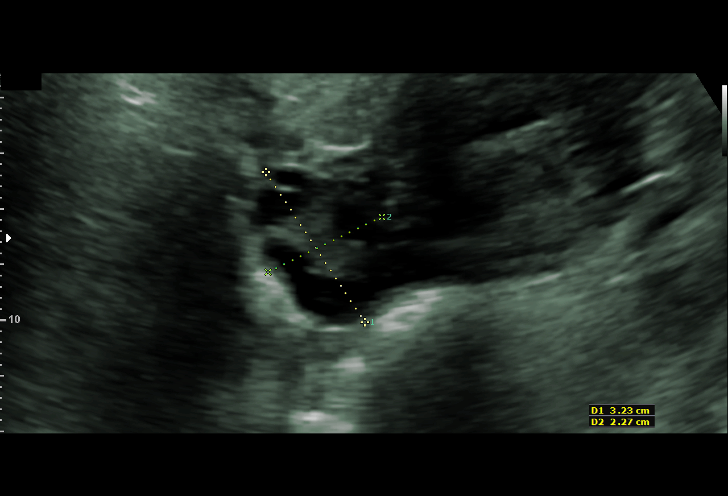
[im 26/62]
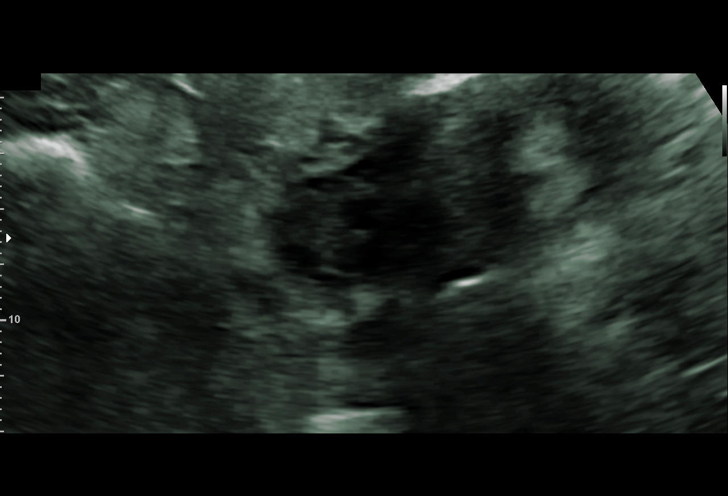
[im 31/62]
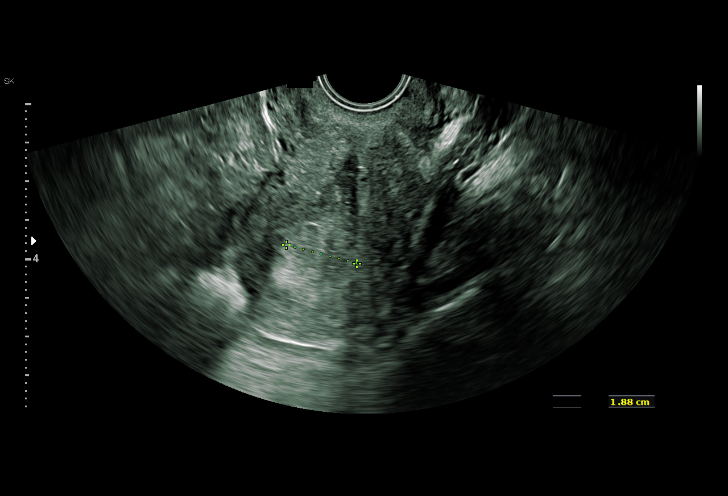
[im 36/62]
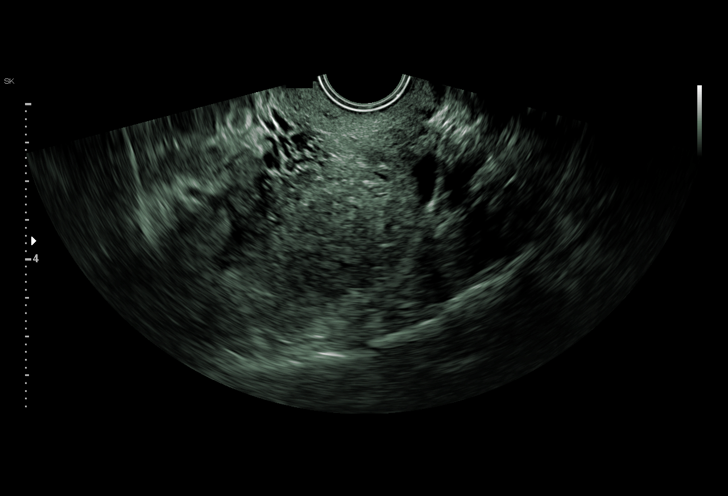
[im 39/62]
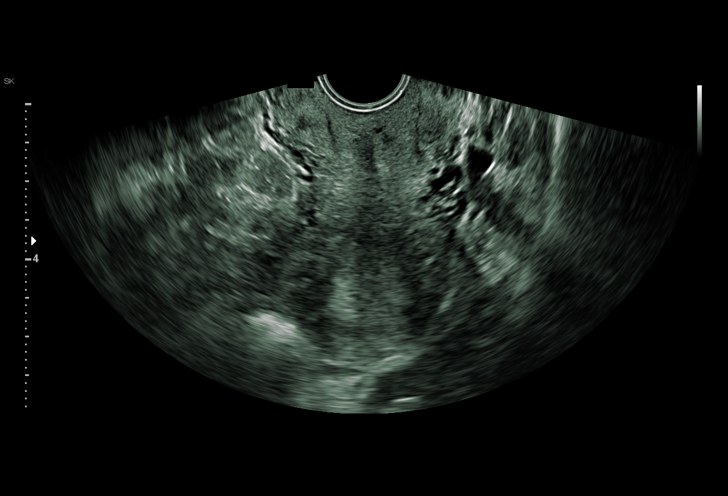
[im 44/62]
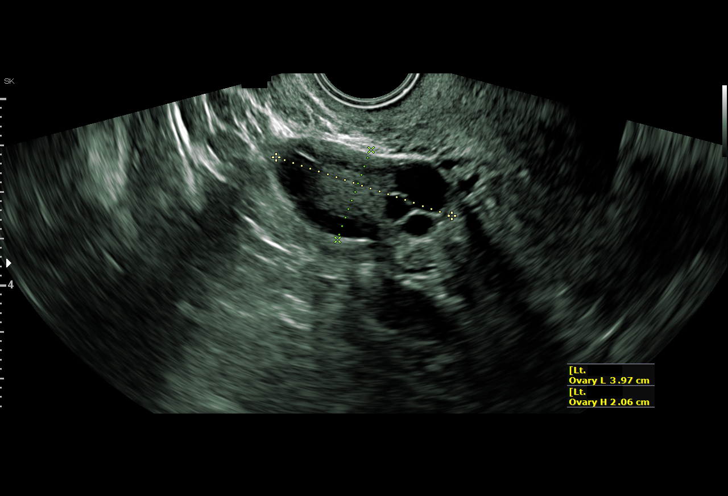
[im 49/62]
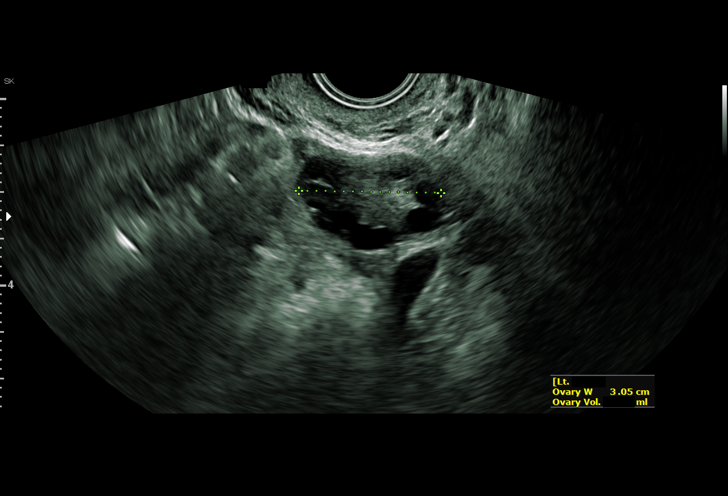
[im 51/62]
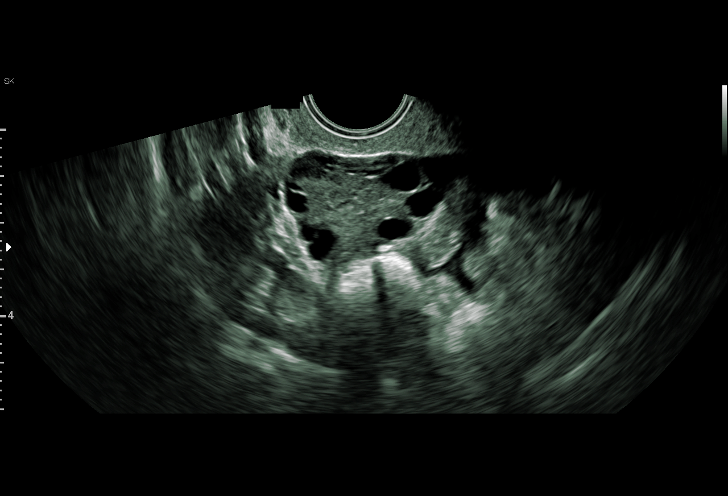
[im 56/62]
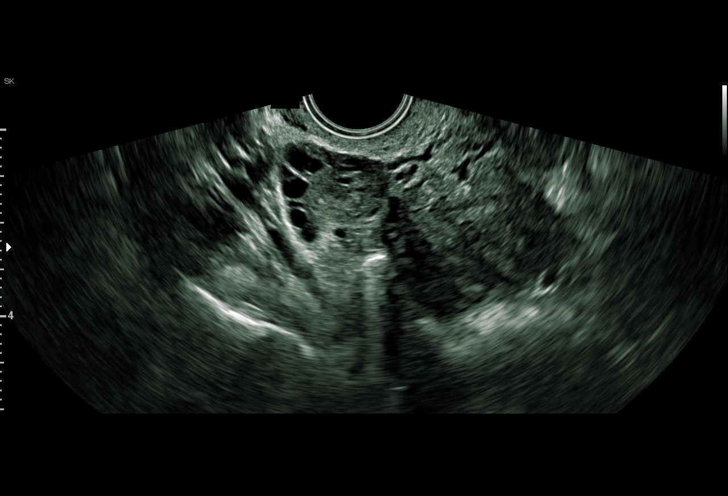
[im 62/62]
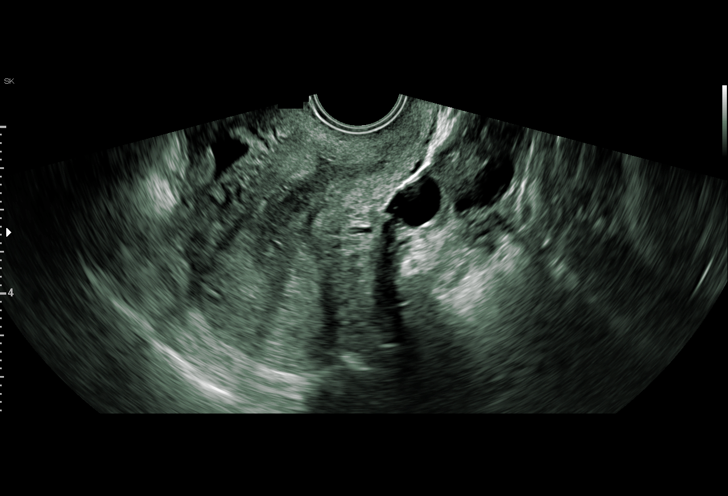

[15 of 25 positions shown; findings below may reference images not displayed]

FINDINGS: Uterus

Measurements: 6.3 x 4.4 x 4.8 cm. Uterus appears retroverted. No
fibroids or other mass visualized.

Endometrium

Thickness: 18 mm.  No focal abnormality visualized.

Right ovary

Measurements: 3.6 x 2.4 x 2.7 cm. Normal appearance/no adnexal mass.

Left ovary

Measurements: 4 x 2.1 x 3.1 cm. Normal appearance/no adnexal mass.

Other findings

No abnormal free fluid.
IMPRESSION: Endometrium is mildly thickened at 18 mm. No focal lesions
identified. If bleeding remains unresponsive to hormonal or medical
therapy, focal lesion work-up with sonohysterogram should be
considered. Endometrial biopsy should also be considered in
pre-menopausal patients at high risk for endometrial carcinoma.
(Ref: Radiological Reasoning: Algorithmic Workup of Abnormal Vaginal
Bleeding with Endovaginal Sonography and Sonohysterography. AJR
3114; 191:S68-73)

## 2018-08-02 ENCOUNTER — Ambulatory Visit (HOSPITAL_COMMUNITY)
Admission: RE | Admit: 2018-08-02 | Discharge: 2018-08-02 | Disposition: A | Discharge status: Home / Self Care | Payer: 59 | Attending: Psychiatry | Admitting: Psychiatry

## 2018-08-02 DIAGNOSIS — F322 Major depressive disorder, single episode, severe without psychotic features: Secondary | ICD-10-CM | POA: Diagnosis not present

## 2018-08-02 NOTE — H&P (Signed)
Behavioral Health Medical Screening Exam  Webb LawsRashena Poole is an 27 y.o. female patient [facility] with complaints of depression related to boyfriend and work; feeling stressed and overwhelmed looking for outpatient psychiatric services.  Patient denies suicidal/self-harm/homicidal ideation, psychosis, and paranoia.  Total Time spent with patient: 30 minutes  Psychiatric Specialty Exam: Physical Exam  Constitutional: She is oriented to person, place, and time. She appears well-developed and well-nourished. No distress.  Neck: Normal range of motion.  Respiratory: Effort normal.  Musculoskeletal: Normal range of motion.  Neurological: She is alert and oriented to person, place, and time.  Skin: Skin is warm and dry.  Psychiatric: Her speech is normal and behavior is normal. Judgment and thought content normal. Cognition and memory are normal. She exhibits a depressed mood (Stable).    Review of Systems  Psychiatric/Behavioral: Positive for depression (Stable). Hallucinations: Denies. Memory loss: Debnies. Substance abuse: Denies. Suicidal ideas: Denies. Nervous/anxious: Stable. Insomnia: Denies.   All other systems reviewed and are negative.   Blood pressure 119/67, pulse (!) 54, temperature 98.5 F (36.9 C), resp. rate 16, SpO2 100 %.There is no height or weight on file to calculate BMI.  General Appearance: Casual  Eye Contact:  Good  Speech:  Clear and Coherent and Normal Rate  Volume:  Normal  Mood:  Depressed  Affect:  Appropriate and Congruent  Thought Process:  Coherent and Goal Directed  Orientation:  Full (Time, Place, and Person)  Thought Content:  WDL and Logical  Suicidal Thoughts:  No  Homicidal Thoughts:  No  Memory:  Immediate;   Good Recent;   Good Remote;   Good  Judgement:  Intact  Insight:  Present  Psychomotor Activity:  Normal  Concentration: Concentration: Good and Attention Span: Good  Recall:  Good  Fund of Knowledge:Good   Language: Good  Akathisia:  No  Handed:  Right  AIMS (if indicated):     Assets:  Communication Skills Desire for Improvement Housing Physical Health Social Support  Sleep:       Musculoskeletal: Strength & Muscle Tone: within normal limits Gait & Station: normal Patient leans: N/A  Blood pressure 119/67, pulse (!) 54, temperature 98.5 F (36.9 C), resp. rate 16, SpO2 100 %.  Recommendations:  IOP.  Discussed speaking with her job related to IOP program Disposition: No evidence of imminent risk to self or others at present.   Recommend psychiatric Inpatient admission when medically cleared. Supportive therapy provided about ongoing stressors. Refer to IOP. Discussed crisis plan, support from social network, calling 911, coming to the Emergency Department, and calling Suicide Hotline.  Based on my evaluation the patient does not appear to have an emergency medical condition.  Lauren Negrette, NP 08/02/2018, 9:43 AM
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
## 2018-08-07 ENCOUNTER — Emergency Department (HOSPITAL_COMMUNITY)
Admission: EM | Admit: 2018-08-07 | Discharge: 2018-08-07 | Disposition: A | Discharge status: Home / Self Care | Payer: 59 | Attending: Emergency Medicine | Admitting: Emergency Medicine

## 2018-08-07 ENCOUNTER — Encounter (HOSPITAL_COMMUNITY): Payer: Self-pay | Admitting: Emergency Medicine

## 2018-08-07 DIAGNOSIS — R42 Dizziness and giddiness: Secondary | ICD-10-CM | POA: Insufficient documentation

## 2018-08-07 DIAGNOSIS — R82998 Other abnormal findings in urine: Secondary | ICD-10-CM | POA: Insufficient documentation

## 2018-08-07 DIAGNOSIS — R1110 Vomiting, unspecified: Secondary | ICD-10-CM | POA: Diagnosis present

## 2018-08-07 DIAGNOSIS — R197 Diarrhea, unspecified: Secondary | ICD-10-CM | POA: Insufficient documentation

## 2018-08-07 DIAGNOSIS — F17200 Nicotine dependence, unspecified, uncomplicated: Secondary | ICD-10-CM | POA: Insufficient documentation

## 2018-08-07 DIAGNOSIS — R112 Nausea with vomiting, unspecified: Secondary | ICD-10-CM

## 2018-08-07 LAB — COMPREHENSIVE METABOLIC PANEL
ALBUMIN: 3.9 g/dL (ref 3.5–5.0)
ALT: 32 U/L (ref 0–44)
AST: 38 U/L (ref 15–41)
Alkaline Phosphatase: 78 U/L (ref 38–126)
Anion gap: 11 (ref 5–15)
BUN: 9 mg/dL (ref 6–20)
CHLORIDE: 109 mmol/L (ref 98–111)
CO2: 23 mmol/L (ref 22–32)
CREATININE: 0.75 mg/dL (ref 0.44–1.00)
Calcium: 8.6 mg/dL — ABNORMAL LOW (ref 8.9–10.3)
GFR calc Af Amer: >60 mL/min (ref >60)
GFR calc non Af Amer: >60 mL/min (ref >60)
Glucose, Bld: 95 mg/dL (ref 70–99)
Potassium: 4 mmol/L (ref 3.5–5.1)
SODIUM: 143 mmol/L (ref 135–145)
Total Bilirubin: 0.7 mg/dL (ref 0.3–1.2)
Total Protein: 6.8 g/dL (ref 6.5–8.1)

## 2018-08-07 LAB — I-STAT TROPONIN, ED: TROPONIN I, POC: 0.01 ng/mL (ref 0.00–0.08)

## 2018-08-07 LAB — URINALYSIS, ROUTINE W REFLEX MICROSCOPIC
BILIRUBIN URINE: NEGATIVE
Glucose, UA: NEGATIVE mg/dL
KETONES UR: 5 mg/dL — AB
LEUKOCYTES UA: NEGATIVE
Nitrite: POSITIVE — AB
PROTEIN: 30 mg/dL — AB
Specific Gravity, Urine: 1.027 (ref 1.005–1.030)
pH: 5 (ref 5.0–8.0)

## 2018-08-07 LAB — CBC
HEMATOCRIT: 38.8 % (ref 36.0–46.0)
Hemoglobin: 12.7 g/dL (ref 12.0–15.0)
MCH: 32.1 pg (ref 26.0–34.0)
MCHC: 32.7 g/dL (ref 30.0–36.0)
MCV: 98 fL (ref 80.0–100.0)
NRBC: 0 % (ref 0.0–0.2)
Platelets: 219 10*3/uL (ref 150–400)
RBC: 3.96 MIL/uL (ref 3.87–5.11)
RDW: 12.9 % (ref 11.5–15.5)
WBC: 5.2 10*3/uL (ref 4.0–10.5)

## 2018-08-07 LAB — I-STAT CHEM 8, ED
BUN: 7 mg/dL (ref 6–20)
CALCIUM ION: 1.18 mmol/L (ref 1.15–1.40)
Chloride: 105 mmol/L (ref 98–111)
Creatinine, Ser: 0.6 mg/dL (ref 0.44–1.00)
GLUCOSE: 93 mg/dL (ref 70–99)
HCT: 39 % (ref 36.0–46.0)
HEMOGLOBIN: 13.3 g/dL (ref 12.0–15.0)
Potassium: 3.9 mmol/L (ref 3.5–5.1)
Sodium: 141 mmol/L (ref 135–145)
TCO2: 23 mmol/L (ref 22–32)

## 2018-08-07 LAB — LIPASE, BLOOD: Lipase: 31 U/L (ref 11–51)

## 2018-08-07 LAB — I-STAT BETA HCG BLOOD, ED (MC, WL, AP ONLY)

## 2018-08-07 MED ORDER — ONDANSETRON 4 MG PO TBDP: 4.0000 mg | ORAL_TABLET | Freq: Three times a day (TID) | ORAL | 0 refills | Status: DC | PRN

## 2018-08-07 NOTE — ED Triage Notes (Signed)
Per [facility] pt from home for n/v/d since last night. Patient went to party yesterday and too much to drink. Pt reports dizziness when stands up. Pt was given Zofran 4mg  and NS 800ml via 18g in left AC in route.  122/81, 78HR, 97% on room air CBG 107.

## 2018-08-07 NOTE — ED Provider Notes (Signed)
[facility]-EMERGENCY DEPT Provider Note   CSN: [id number] Arrival date & time: 08/07/18  1308     History   Chief Complaint Chief Complaint  Patient presents with  . Emesis  . Diarrhea    HPI Lauren LawsRashena Poole is a 27 y.o. otherwise healthy female, who presents to the ED with complaints of nausea, vomiting, and lightheadedness with standing that started last night while she was at a holiday party eating in excess and drinking alcohol, but has fully resolved after 4 mg of Zofran and 800 mL of IV fluids were given in route.  She states that currently she feels much better and is ready to go home.  She reports too numerous to count episodes of nonbloody nonbilious emesis overnight that has completely resolved with the Zofran.  She also reports 4 episodes of nonbloody diarrhea, but that also seems to have resolved.  She states that the lightheadedness was only with standing, and improved entirely once she received IV fluids.  She is not sure whether she ate something that was bad at the holiday party last night.  She denies syncope, ongoing lightheadedness, fevers, chills, CP, SOB, abd pain, constipation, obstipation, melena, hematochezia, hematemesis, hematuria, dysuria, vaginal bleeding/discharge, myalgias, arthralgias, numbness, tingling, focal weakness, or any other complaints at this time. Denies recent travel, sick contacts, or NSAID use.   The history is provided by the patient and medical records. No language interpreter was used.  Emesis   Associated symptoms include diarrhea. Pertinent negatives include no abdominal pain, no arthralgias, no chills, no fever and no myalgias.  Diarrhea   Associated symptoms include vomiting. Pertinent negatives include no abdominal pain, no chills, no arthralgias and no myalgias.    Past Medical History:  Diagnosis Date  . Chlamydia   . Gonorrhea     There are no active problems to display for this patient.   Past  Surgical History:  Procedure Laterality Date  . NO PAST SURGERIES       OB History    Gravida  1   Para      Term      Preterm      AB  1   Living        SAB  1   TAB      Ectopic      Multiple      Live Births               Home Medications    Prior to Admission medications   Medication Sig Start Date End Date Taking? Authorizing Provider  SPRINTEC 28 0.25-35 MG-MCG tablet TAKE ONE TABLET BY MOUTH ONCE DAILY Patient not taking: Reported on 08/07/2018 07/19/17   Armando ReichertHogan, Heather D, CNM    Family History No family history on file.  Social History Social History   Tobacco Use  . Smoking status: Light Tobacco Smoker  . Smokeless tobacco: Never Used  Substance Use Topics  . Alcohol use: Yes  . Drug use: No     Allergies   Patient has no known allergies.   Review of Systems Review of Systems  Constitutional: Negative for chills and fever.  Respiratory: Negative for shortness of breath.   Cardiovascular: Negative for chest pain.  Gastrointestinal: Positive for diarrhea, nausea and vomiting. Negative for abdominal pain and constipation.  Genitourinary: Negative for dysuria, hematuria, vaginal bleeding and vaginal discharge.  Musculoskeletal: Negative for arthralgias and myalgias.  Skin: Negative for color change.  Allergic/Immunologic: Negative for immunocompromised state.  Neurological: Positive for light-headedness (with standing). Negative for weakness and numbness.  Psychiatric/Behavioral: Negative for confusion.   All other systems reviewed and are negative for acute change except as noted in the HPI.    Physical Exam Updated Vital Signs BP 116/78 (BP Location: Right Arm)   Pulse 62   Temp 98.2 F (36.8 C) (Oral)   Resp 16   LMP 07/31/2018   SpO2 97%   Physical Exam Vitals signs and nursing note reviewed.  Constitutional:      General: She is not in acute distress.    Appearance: Normal appearance. She is well-developed. She is not  toxic-appearing.     Comments: Afebrile, nontoxic, NAD  HENT:     Head: Normocephalic and atraumatic.  Eyes:     General:        Right eye: No discharge.        Left eye: No discharge.     Conjunctiva/sclera: Conjunctivae normal.  Neck:     Musculoskeletal: Normal range of motion and neck supple.  Cardiovascular:     Rate and Rhythm: Normal rate and regular rhythm.     Heart sounds: Normal heart sounds, S1 normal and S2 normal. No murmur. No friction rub. No gallop.   Pulmonary:     Effort: Pulmonary effort is normal. No respiratory distress.     Breath sounds: Normal breath sounds. No decreased breath sounds, wheezing, rhonchi or rales.  Abdominal:     General: Bowel sounds are normal. There is no distension.     Palpations: Abdomen is soft. Abdomen is not rigid.     Tenderness: There is no abdominal tenderness. There is no right CVA tenderness, left CVA tenderness, guarding or rebound. Negative signs include Murphy's sign and McBurney's sign.     Comments: Soft, NTND, +BS throughout, no Lauren/g/Lauren, neg murphy's, neg mcburney's, no CVA TTP   Musculoskeletal: Normal range of motion.  Skin:    General: Skin is warm and dry.     Findings: No rash.  Neurological:     Mental Status: She is alert and oriented to person, place, and time.     Sensory: Sensation is intact. No sensory deficit.     Motor: Motor function is intact.  Psychiatric:        Mood and Affect: Mood and affect normal.        Behavior: Behavior normal.      ED Treatments / Results  Labs (all labs ordered are listed, but only abnormal results are displayed) Labs Reviewed  COMPREHENSIVE METABOLIC PANEL - Abnormal; Notable for the following components:      Result Value   Calcium 8.6 (*)    All other components within normal limits  URINALYSIS, ROUTINE W REFLEX MICROSCOPIC - Abnormal; Notable for the following components:   APPearance HAZY (*)    Hgb urine dipstick MODERATE (*)    Ketones, ur 5 (*)    Protein, ur  30 (*)    Nitrite POSITIVE (*)    Bacteria, UA RARE (*)    All other components within normal limits  URINE CULTURE  LIPASE, BLOOD  CBC  I-STAT BETA HCG BLOOD, ED ([facility], [facility], [facility] ONLY)  I-STAT CHEM 8, ED  I-STAT TROPONIN, ED    EKG None  Radiology No results found.  Procedures Procedures (including critical care time)  Medications Ordered in ED Medications - No data to display   Initial Impression / Assessment and Plan / ED Course  I have reviewed the triage  vital signs and the nursing notes.  Pertinent labs & imaging results that were available during my care of the patient were reviewed by me and considered in my medical decision making (see chart for details).     27 y.o. female here with n/v/d and lightheadedness that completely resolved after she received of IVFs and 4mg  of zofran en route. She feels completely better at this time and is tolerating PO well during evaluation. On exam, no abdominal tenderness, pt well appearing, in NAD, moist mucous membranes, with stable vital signs. Work up here shows: trop neg, chem 8 WNL, betaHCG neg, lipase WNL, CMP WNL, CBC WNL, and U/A with 6-10 squamous and rare bacteria, no leuks, 0-5 WBC, not consistent with UTI although +nitrites (could be from pigment skewing this test result), will send for UCx but doubt need for empiric tx given lack of symptoms and lack of convincing U/A findings. These symptoms started after going to a holiday party and drinking EtOH and eating in excess, I suspect this is the source of her symptoms. She likely got relatively dehydrated from the EtOH consumption. It's possible that a viral gastroenteritis is also to blame. She is well appearing in NAD, and tolerating PO well. I feel she is safe for d/c with zofran, advised BRAT diet, adequate hydration and rest, and f/up with PCP in 1wk. Doubt need for further labs/imaging/work up at this time. I explained the diagnosis and have given explicit precautions to  return to the ER including for any other new or worsening symptoms. The patient understands and accepts the medical plan as it's been dictated and I have answered their questions. Discharge instructions concerning home care and prescriptions have been given. The patient is STABLE and is discharged to home in good condition.    Final Clinical Impressions(s) / ED Diagnoses   Final diagnoses:  Nausea vomiting and diarrhea  Orthostatic lightheadedness    ED Discharge Orders         Ordered    ondansetron (ZOFRAN ODT) 4 MG disintegrating tablet  Every 8 hours PRN     08/07/18 [address] [place], [place] 08/07/18 1509    Shaune Pollack, MD 08/08/18 [phone]

## 2018-08-07 NOTE — Discharge Instructions (Signed)
Avoid alcohol, try to take it easy for the next few days. Use zofran as prescribed, as needed for nausea. Alternate between tylenol and motrin as needed for pain. May consider using over the counter tums, maalox, pepto bismol, or other over the counter remedies to help with symptoms. Stay well hydrated with small sips of fluids throughout the day. Follow a BRAT (banana-rice-applesauce-toast) diet as described below for the next 24-48 hours. The 'BRAT' diet is suggested, then progress to diet as tolerated as symptoms abate. Call your regular doctor if bloody stools, persistent diarrhea, vomiting, fever or abdominal pain. Follow up with your regular doctor in 1 week for recheck of symptoms. Return to ER for changing or worsening of symptoms.

## 2018-08-10 LAB — URINE CULTURE: Culture: >=100000 — AB

## 2018-09-28 ENCOUNTER — Encounter (HOSPITAL_COMMUNITY): Payer: Self-pay | Admitting: Psychiatry

## 2018-09-28 ENCOUNTER — Ambulatory Visit (INDEPENDENT_AMBULATORY_CARE_PROVIDER_SITE_OTHER): Payer: 59 | Admitting: Psychiatry

## 2018-09-28 VITALS — BP 126/70 | HR 72 | Ht 64.0 in | Wt 257.0 lb

## 2018-09-28 DIAGNOSIS — F1010 Alcohol abuse, uncomplicated: Secondary | ICD-10-CM

## 2018-09-28 DIAGNOSIS — F331 Major depressive disorder, recurrent, moderate: Secondary | ICD-10-CM

## 2018-09-28 DIAGNOSIS — F39 Unspecified mood [affective] disorder: Secondary | ICD-10-CM | POA: Diagnosis not present
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
## 2019-07-02 ENCOUNTER — Other Ambulatory Visit: Payer: Self-pay

## 2019-07-02 ENCOUNTER — Encounter (HOSPITAL_COMMUNITY): Payer: Self-pay

## 2019-07-02 ENCOUNTER — Emergency Department (HOSPITAL_COMMUNITY)
Admission: EM | Admit: 2019-07-02 | Discharge: 2019-07-02 | Disposition: A | Discharge status: Home / Self Care | Payer: 59 | Attending: Emergency Medicine | Admitting: Emergency Medicine

## 2019-07-02 DIAGNOSIS — Y999 Unspecified external cause status: Secondary | ICD-10-CM | POA: Diagnosis not present

## 2019-07-02 DIAGNOSIS — Y929 Unspecified place or not applicable: Secondary | ICD-10-CM | POA: Insufficient documentation

## 2019-07-02 DIAGNOSIS — S61309A Unspecified open wound of unspecified finger with damage to nail, initial encounter: Secondary | ICD-10-CM

## 2019-07-02 DIAGNOSIS — X58XXXA Exposure to other specified factors, initial encounter: Secondary | ICD-10-CM | POA: Diagnosis not present

## 2019-07-02 DIAGNOSIS — F17200 Nicotine dependence, unspecified, uncomplicated: Secondary | ICD-10-CM | POA: Insufficient documentation

## 2019-07-02 DIAGNOSIS — S61304A Unspecified open wound of right ring finger with damage to nail, initial encounter: Secondary | ICD-10-CM | POA: Diagnosis not present

## 2019-07-02 DIAGNOSIS — S6991XA Unspecified injury of right wrist, hand and finger(s), initial encounter: Secondary | ICD-10-CM | POA: Diagnosis present

## 2019-07-02 DIAGNOSIS — Y9389 Activity, other specified: Secondary | ICD-10-CM | POA: Diagnosis not present

## 2019-07-02 MED ORDER — ACETAMINOPHEN 500 MG PO TABS
1000.0000 mg | ORAL_TABLET | Freq: Once | ORAL | Status: AC
  Administered 2019-07-02: 1000 mg via ORAL
  Filled 2019-07-02: qty 2

## 2019-07-02 NOTE — ED Notes (Signed)
An After Visit Summary was printed and given to the patient. Discharge instructions given and no further questions at this time. Pt leaving with finger splint.

## 2019-07-02 NOTE — Discharge Instructions (Addendum)
Please remove acrylic nail at home and use finger splint as directed to keep finger still. Wound care as discussed.

## 2019-07-02 NOTE — ED Provider Notes (Addendum)
[facility]-EMERGENCY DEPT Provider Note   CSN: [id number] Arrival date & time: 07/02/19  1047     History   Chief Complaint Chief Complaint  Patient presents with  . Finger Injury    HPI Jerlean Peralta is a 28 y.o. female.     The history is provided by the patient. No language interpreter was used.    Patient presents today for right ring finger nail pain and partial avulsion.  Patient states she sat on her right hand today in a way that caused the acrylic nail and natural nail to lift up partially.  Patient states that it is nonpainful unless she touches the area.  Denies any bleeding, purulent discharge, endorses some scant clear discharge from underneath the nail area.  Patient denies any trauma to her digit and states that she has good sensation and can move her finger but that even lightly touching the acrylic nail extension causes pain.   Past Medical History:  Diagnosis Date  . Chlamydia   . Gonorrhea     There are no active problems to display for this patient.   Past Surgical History:  Procedure Laterality Date  . NO PAST SURGERIES       OB History    Gravida  1   Para      Term      Preterm      AB  1   Living        SAB  1   TAB      Ectopic      Multiple      Live Births               Home Medications    Prior to Admission medications   Not on File    Family History Family History  Problem Relation Age of Onset  . Schizophrenia Sister     Social History Social History   Tobacco Use  . Smoking status: Light Tobacco Smoker  . Smokeless tobacco: Never Used  Substance Use Topics  . Alcohol use: Yes  . Drug use: No     Allergies   Patient has no known allergies.   Review of Systems Review of Systems  Constitutional: Negative for fatigue and fever.  HENT: Negative for congestion.   Respiratory: Negative for chest tightness.   Cardiovascular: Negative for chest pain.   Gastrointestinal: Negative for abdominal pain.  Skin: Negative for rash.       Partial nail avulsion     Physical Exam Updated Vital Signs BP 119/86   Pulse 85   Temp 98.9 F (37.2 C) (Oral)   Resp 18   SpO2 99%   Physical Exam Vitals signs and nursing note reviewed.  Constitutional:      General: She is not in acute distress.    Appearance: Normal appearance. She is not ill-appearing.  HENT:     Head: Normocephalic and atraumatic.  Eyes:     General: No scleral icterus.       Right eye: No discharge.        Left eye: No discharge.     Conjunctiva/sclera: Conjunctivae normal.  Pulmonary:     Effort: Pulmonary effort is normal.     Breath sounds: No stridor.  Musculoskeletal:     Comments: Able to flex and extend all fingers against resistance.  Skin:    Capillary Refill: Capillary refill takes less than 2 seconds.     Comments: Right ring finger nail  partially avulsed.  Still attached to the nail plate.  No bleeding, scant serosanguineous fluid present under the nail, no purulent discharge.  Neurological:     Mental Status: She is alert and oriented to person, place, and time. Mental status is at baseline.     Comments: Sensation intact all fingertips      ED Treatments / Results  Labs (all labs ordered are listed, but only abnormal results are displayed) Labs Reviewed - No data to display  EKG None  Radiology No results found.  Procedures Procedures (including critical care time)  Medications Ordered in ED Medications  acetaminophen (TYLENOL) tablet 1,000 mg (1,000 mg Oral Given 07/02/19 1147)     Initial Impression / Assessment and Plan / ED Course  I have reviewed the triage vital signs and the nursing notes.  Pertinent labs & imaging results that were available during my care of the patient were reviewed by me and considered in my medical decision making (see chart for details).        Patient is 28 year old female with no history of  diabetes, PAD, immunosuppressed state presenting after partial nail avulsion that occurred earlier today with minimal trauma.  That is still intact.  However patient has a large, acrylic nails extending out past her natural nail plate.  Patient states that the only way to remove the sacroiliac nails is with pain to the nail and work with acetone.  In the ED today he only has acetone wipes which patient states would not work.  Discussed with patient options to remove entire nail which is a significant risk of that nail will not regrow the same way.  Discussed that likely best treatment option would be to discharge patient with finger splint and have patient remove her acrylic nail at home and wear the finger splint with bandage to keep nail plate closely adhered to the nailbed.  Discussed cosmesis and healing outcome.  Discussed return precautions.  Patient understanding of plan this time has normal vitals no concern for any other emergent pathology evaluation today.  Final Clinical Impressions(s) / ED Diagnoses   Final diagnoses:  Avulsion of fingernail, initial encounter    ED Discharge Orders    None       Tedd Sias, [place] 07/02/19 1816    Tedd Sias, [place] 07/02/19 1831    Daleen Bo, MD 07/03/19 1043

## 2019-07-02 NOTE — ED Notes (Signed)
ED Provider at bedside. 

## 2019-07-02 NOTE — ED Triage Notes (Addendum)
Pt states her sat down on her right hand, ring finger and the acrylic nail and natural nail underneath almost came off. Pt states its "barely hanging on". Pain 0/10 not touching finger, 10/10 when it is touched.

## 2020-01-22 ENCOUNTER — Other Ambulatory Visit: Payer: Self-pay | Admitting: Family Medicine

## 2020-01-22 DIAGNOSIS — N6320 Unspecified lump in the left breast, unspecified quadrant: Secondary | ICD-10-CM

## 2020-01-24 ENCOUNTER — Other Ambulatory Visit: Payer: 59

## 2022-09-19 ENCOUNTER — Other Ambulatory Visit: Payer: Self-pay

## 2022-09-19 ENCOUNTER — Encounter (HOSPITAL_COMMUNITY): Payer: Self-pay | Admitting: Emergency Medicine

## 2022-09-19 ENCOUNTER — Emergency Department (HOSPITAL_COMMUNITY): Payer: Managed Care, Other (non HMO)

## 2022-09-19 ENCOUNTER — Emergency Department (HOSPITAL_COMMUNITY)
Admission: EM | Admit: 2022-09-19 | Discharge: 2022-09-20 | Disposition: A | Discharge status: Home / Self Care | Payer: Managed Care, Other (non HMO) | Attending: Emergency Medicine | Admitting: Emergency Medicine

## 2022-09-19 DIAGNOSIS — W108XXA Fall (on) (from) other stairs and steps, initial encounter: Secondary | ICD-10-CM | POA: Diagnosis not present

## 2022-09-19 DIAGNOSIS — S4991XA Unspecified injury of right shoulder and upper arm, initial encounter: Secondary | ICD-10-CM | POA: Diagnosis present

## 2022-09-19 DIAGNOSIS — R451 Restlessness and agitation: Secondary | ICD-10-CM | POA: Diagnosis not present

## 2022-09-19 DIAGNOSIS — S43004A Unspecified dislocation of right shoulder joint, initial encounter: Secondary | ICD-10-CM | POA: Insufficient documentation

## 2022-09-19 MED ORDER — ETOMIDATE 2 MG/ML IV SOLN
10.0000 mg | Freq: Once | INTRAVENOUS | Status: AC
  Administered 2022-09-19: 10 mg via INTRAVENOUS
  Filled 2022-09-19: qty 10

## 2022-09-19 MED ORDER — MIDAZOLAM HCL 2 MG/2ML IJ SOLN
1.0000 mg | Freq: Once | INTRAMUSCULAR | Status: AC
  Administered 2022-09-19: 1 mg via INTRAVENOUS
  Filled 2022-09-19: qty 2

## 2022-09-19 MED ORDER — ONDANSETRON HCL 4 MG/2ML IJ SOLN
4.0000 mg | Freq: Once | INTRAMUSCULAR | Status: AC
  Administered 2022-09-19: 4 mg via INTRAVENOUS
  Filled 2022-09-19: qty 2

## 2022-09-19 MED ORDER — MORPHINE SULFATE (PF) 4 MG/ML IV SOLN
4.0000 mg | Freq: Once | INTRAVENOUS | Status: DC
  Filled 2022-09-19: qty 1

## 2022-09-19 MED ORDER — MORPHINE SULFATE (PF) 4 MG/ML IV SOLN
4.0000 mg | Freq: Once | INTRAVENOUS | Status: AC
  Administered 2022-09-19: 4 mg via INTRAMUSCULAR

## 2022-09-19 MED ORDER — IBUPROFEN 600 MG PO TABS: 600.0000 mg | ORAL_TABLET | Freq: Four times a day (QID) | ORAL | 0 refills | Status: DC | PRN

## 2022-09-19 MED ORDER — HYDROCODONE-ACETAMINOPHEN 5-325 MG PO TABS: 1.0000 | ORAL_TABLET | ORAL | 0 refills | Status: DC | PRN

## 2022-09-19 NOTE — ED Triage Notes (Addendum)
Slipped coming down the stars and braced her R arm to catch herself. No open wounds noted. Sensation intact, 2+ radial pulse, good color and cap refill. Pain in R shoulder and R forearm

## 2022-09-19 NOTE — ED Provider Notes (Signed)
[facility] EMERGENCY DEPARTMENT AT [facility] Provider Note   CSN: [id number] Arrival date & time: 09/19/22  2130     History {Add pertinent medical, surgical, social history, OB history to HPI:1} Chief Complaint  Patient presents with   Arm Injury    Lauren Poole is a 32 y.o. female.  Pt is a 32 yo female with pmhx significant for depression and anxiety.  Pt fell down the stairs and landed on her right shoulder.  She feels like her bone is not attached.  She denies hitting her head.  She had no loc.  She is a poor historian.       Home Medications Prior to Admission medications   Medication Sig Start Date End Date Taking? Authorizing Provider  HYDROcodone-acetaminophen (NORCO/VICODIN) 5-325 MG tablet Take 1 tablet by mouth every 4 (four) hours as needed. 09/19/22  Yes Isla Pence, MD  ibuprofen (ADVIL) 600 MG tablet Take 1 tablet (600 mg total) by mouth every 6 (six) hours as needed. 09/19/22  Yes Isla Pence, MD      Allergies    Patient has no known allergies.    Review of Systems   Review of Systems  Musculoskeletal:        Right upper arm pain  All other systems reviewed and are negative.   Physical Exam Updated Vital Signs BP (!) 156/129 (BP Location: Right Arm)   Pulse (!) 117   Temp 98 F (36.7 C) (Oral)   Resp 18   Ht 5\' 4"  (1.626 m)   Wt 113.4 kg   LMP 09/18/2022 (Approximate)   SpO2 100%   BMI 42.91 kg/m  Physical Exam Vitals and nursing note reviewed.  Constitutional:      General: She is in acute distress.     Appearance: Normal appearance. She is obese.  HENT:     Head: Normocephalic and atraumatic.     Right Ear: External ear normal.     Left Ear: External ear normal.     Nose: Nose normal.     Mouth/Throat:     Mouth: Mucous membranes are moist.     Pharynx: Oropharynx is clear.  Eyes:     Extraocular Movements: Extraocular movements intact.     Pupils: Pupils are equal, round, and reactive to light.   Cardiovascular:     Rate and Rhythm: Normal rate and regular rhythm.     Pulses: Normal pulses.     Heart sounds: Normal heart sounds.  Pulmonary:     Effort: Pulmonary effort is normal.     Breath sounds: Normal breath sounds.  Abdominal:     General: Abdomen is flat. Bowel sounds are normal.     Palpations: Abdomen is soft.  Musculoskeletal:       Arms:     Cervical back: Normal range of motion and neck supple.  Skin:    General: Skin is warm.     Capillary Refill: Capillary refill takes less than 2 seconds.  Neurological:     General: No focal deficit present.     Mental Status: She is alert and oriented to person, place, and time.  Psychiatric:        Mood and Affect: Mood normal.        Behavior: Behavior is agitated.     ED Results / Procedures / Treatments   Labs (all labs ordered are listed, but only abnormal results are displayed) Labs Reviewed - No data to display  EKG None  Radiology  DG Shoulder Right  Result Date: 09/19/2022 CLINICAL DATA:  Slipped and fell, right shoulder pain EXAM: RIGHT SHOULDER - 2+ VIEW COMPARISON:  None Available. FINDINGS: Frontal and transscapular views of the right shoulder demonstrate anterior glenohumeral dislocation. No evidence of fracture. Soft tissues are unremarkable. The right chest is clear. IMPRESSION: 1. Anterior glenohumeral dislocation of the right shoulder. No evidence of fracture. Electronically Signed   By: Randa Ngo M.D.   On: 09/19/2022 22:35    Procedures .Sedation  Date/Time: 09/19/2022 11:26 PM  Performed by: Isla Pence, MD Authorized by: Isla Pence, MD   Consent:    Consent obtained:  Verbal   Consent given by:  Patient Universal protocol:    Immediately prior to procedure, a time out was called: yes     Patient identity confirmed:  Verbally with patient Indications:    Procedure performed:  Dislocation reduction   Procedure necessitating sedation performed by:  Physician performing  sedation Pre-sedation assessment:    ASA classification: class 2 - patient with mild systemic disease     Mallampati score:  III - soft palate, base of uvula visible   Pre-sedation assessments completed and reviewed: airway patency, cardiovascular function, hydration status, mental status, nausea/vomiting and pain level   Immediate pre-procedure details:    Reassessment: Patient reassessed immediately prior to procedure   Procedure details (see MAR for exact dosages):    Preoxygenation:  Room air   Sedation:  Etomidate and midazolam   Intended level of sedation: deep   Analgesia:  Morphine   Intra-procedure monitoring:  Blood pressure monitoring, cardiac monitor, continuous capnometry and continuous pulse oximetry   Intra-procedure events: none     Total Provider sedation time (minutes):  30 Post-procedure details:    Attendance: Constant attendance by certified staff until patient recovered     Recovery: Patient returned to pre-procedure baseline     Procedure completion:  Tolerated well, no immediate complications Reduction of dislocation  Date/Time: 09/19/2022 11:27 PM  Performed by: Isla Pence, MD Authorized by: Isla Pence, MD  Consent: Verbal consent obtained. Risks and benefits: risks, benefits and alternatives were discussed Consent given by: patient Required items: required blood products, implants, devices, and special equipment available Patient identity confirmed: verbally with patient Time out: Immediately prior to procedure a "time out" was called to verify the correct patient, procedure, equipment, support staff and site/side marked as required. Local anesthesia used: no  Anesthesia: Local anesthesia used: no  Sedation: Patient sedated: yes Sedatives: etomidate and midazolam Analgesia: morphine  Patient tolerance: patient tolerated the procedure well with no immediate complications     {Document cardiac monitor, telemetry assessment procedure when  appropriate:1}  Medications Ordered in ED Medications  ondansetron (ZOFRAN) injection 4 mg (4 mg Intravenous Given 09/19/22 2205)  morphine (PF) 4 MG/ML injection 4 mg (4 mg Intramuscular Given 09/19/22 2204)  etomidate (AMIDATE) injection 10 mg (10 mg Intravenous Given 09/19/22 2320)  midazolam (VERSED) injection 1 mg (1 mg Intravenous Given 09/19/22 2320)    ED Course/ Medical Decision Making/ A&P   {   Click here for ABCD2, HEART and other calculatorsREFRESH Note before signing :1}                          Medical Decision Making Amount and/or Complexity of Data Reviewed Radiology: ordered.  Risk Prescription drug management.   This patient presents to the ED for concern of arm pain, this involves an extensive number of treatment options,  and is a complaint that carries with it a high risk of complications and morbidity.  The differential diagnosis includes multiple trauma   Co morbidities that complicate the patient evaluation  Anxiety and depression   Additional history obtained:  Additional history obtained from epic chart review External records from outside source obtained and reviewed including boyfriend  Imaging Studies ordered:  I ordered imaging studies including r shoulder   I independently visualized and interpreted imaging which showed  Anterior glenohumeral dislocation of the right shoulder. No  evidence of fracture.   I agree with the radiologist interpretation   Cardiac Monitoring:  The patient was maintained on a cardiac monitor.  I personally viewed and interpreted the cardiac monitored which showed an underlying rhythm of: nsr   Medicines ordered and prescription drug management:  I ordered medication including morphine/zofran  for pain and nausea  Reevaluation of the patient after these medicines showed that the patient improved I have reviewed the patients home medicines and have made adjustments as needed   Test Considered:  xr   Critical  Interventions:  reduction   Problem List / ED Course:  Right shoulder d/l:  shoulder reduced.  Pt put in a sling.  Pt to f/u with ortho.   Reevaluation:  After the interventions noted above, I reevaluated the patient and found that they have :improved   Social Determinants of Health:  Lives at home   Dispostion:  After consideration of the diagnostic results and the patients response to treatment, I feel that the patent would benefit from discharge with outpatient f/u.    {Document critical care time when appropriate:1} {Document review of labs and clinical decision tools ie heart score, Chads2Vasc2 etc:1}  {Document your independent review of radiology images, and any outside records:1} {Document your discussion with family members, caretakers, and with consultants:1} {Document social determinants of health affecting pt's care:1} {Document your decision making why or why not admission, treatments were needed:1} Final Clinical Impression(s) / ED Diagnoses Final diagnoses:  Dislocation of right shoulder joint, initial encounter    Rx / DC Orders ED Discharge Orders          Ordered    ibuprofen (ADVIL) 600 MG tablet  Every 6 hours PRN        09/19/22 2325    HYDROcodone-acetaminophen (NORCO/VICODIN) 5-325 MG tablet  Every 4 hours PRN        09/19/22 2325

## 2022-09-20 NOTE — ED Provider Notes (Signed)
Care assumed from Dr. Gilford Raid, patient with right shoulder dislocation status post procedural sedation with reduction pending postreduction x-ray.  Postreduction x-ray shows adequate reduction without fracture.  I have independently viewed the images, and agree with the radiologist's interpretation.  Patient is ready for discharge.   DG Shoulder Right Portable  Result Date: 09/19/2022 CLINICAL DATA:  Postreduction EXAM: RIGHT SHOULDER - 1 VIEW COMPARISON:  None Available. FINDINGS: Improved alignment of the right glenohumeral joint. The humeral head appears located within the glenoid fossa, allowing for positioning. No visible fracture. IMPRESSION: Improved alignment of the right glenohumeral joint status post reduction. Electronically Signed   By: Ulyses Jarred M.D.   On: 47/84/1282 08:13     Delora Fuel, MD 88/71/95 0002

## 2022-10-08 ENCOUNTER — Other Ambulatory Visit: Payer: Self-pay

## 2022-10-08 ENCOUNTER — Encounter (HOSPITAL_COMMUNITY): Payer: Self-pay

## 2022-10-08 ENCOUNTER — Emergency Department (HOSPITAL_COMMUNITY): Payer: Managed Care, Other (non HMO)

## 2022-10-08 ENCOUNTER — Emergency Department (HOSPITAL_COMMUNITY)
Admission: EM | Admit: 2022-10-08 | Discharge: 2022-10-08 | Disposition: A | Discharge status: Home / Self Care | Payer: Managed Care, Other (non HMO) | Attending: Emergency Medicine | Admitting: Emergency Medicine

## 2022-10-08 DIAGNOSIS — S43014A Anterior dislocation of right humerus, initial encounter: Secondary | ICD-10-CM

## 2022-10-08 DIAGNOSIS — Y9343 Activity, gymnastics: Secondary | ICD-10-CM | POA: Insufficient documentation

## 2022-10-08 DIAGNOSIS — S4991XA Unspecified injury of right shoulder and upper arm, initial encounter: Secondary | ICD-10-CM | POA: Diagnosis present

## 2022-10-08 DIAGNOSIS — X501XXA Overexertion from prolonged static or awkward postures, initial encounter: Secondary | ICD-10-CM | POA: Diagnosis not present

## 2022-10-08 MED ORDER — PROPOFOL 10 MG/ML IV BOLUS
0.5000 mg/kg | Freq: Once | INTRAVENOUS | Status: DC
  Filled 2022-10-08: qty 20

## 2022-10-08 MED ORDER — KETAMINE HCL 50 MG/5ML IJ SOSY
0.5000 mg/kg | PREFILLED_SYRINGE | Freq: Once | INTRAMUSCULAR | Status: AC
  Administered 2022-10-08: 57 mg via INTRAVENOUS
  Filled 2022-10-08: qty 10

## 2022-10-08 MED ORDER — ONDANSETRON HCL 4 MG/2ML IJ SOLN
4.0000 mg | Freq: Once | INTRAMUSCULAR | Status: AC
  Administered 2022-10-08: 4 mg via INTRAVENOUS
  Filled 2022-10-08: qty 2

## 2022-10-08 NOTE — ED Triage Notes (Signed)
[facility]. Felt like she dislocated her right shoulder while working out. Can wiggle her fingers on the right side, cap refill less than 2secs, no obvious signs of injury.  20G in left hand. 55m morphine.   BP 154/84 HR 96 RR 20 97% RA

## 2022-10-08 NOTE — ED Notes (Signed)
Pt is A&Ox4. Pt is now talking on cellphone, resting comfortably.

## 2022-10-08 NOTE — ED Provider Notes (Signed)
[place] EMERGENCY DEPARTMENT AT [facility] Provider Note   CSN: [id number] Arrival date & time: 10/08/22  1304     History  Chief Complaint  Patient presents with   Right Shoulder Dislocation    Lauren Poole is a 32 y.o. female with no significant PMH presents with shoulder pain.   Patient presents with concern for right shoulder dislocation after she was at the gym doing LAT pull downs and felt the shoulder pop out of place.  This is a second time this is occurred to her.  Occurred by chart review on 09/19/2022 and was reduced in the ED with etomidate and midazolam.  Patient denies any numbness tingling the extremity just extreme pain of the right shoulder.  She did not fall on the ground or lose consciousness.  She has no allergies to medications. States she is not pregnant. Received 79m morphine by EMS.   EMS vitals:  BP 154/84 HR 96 RR 20 97% RA  HPI     Home Medications Prior to Admission medications   Medication Sig Start Date End Date Taking? Authorizing Provider  HYDROcodone-acetaminophen (NORCO/VICODIN) 5-325 MG tablet Take 1 tablet by mouth every 4 (four) hours as needed. 09/19/22   HIsla Pence MD  ibuprofen (ADVIL) 600 MG tablet Take 1 tablet (600 mg total) by mouth every 6 (six) hours as needed. 09/19/22   HIsla Pence MD      Allergies    Patient has no known allergies.    Review of Systems   Review of Systems Review of systems Negative for fall, lOC, head trauma.  A 10 point review of systems was performed and is negative unless otherwise reported in HPI.  Physical Exam Updated Vital Signs BP (!) 158/121   Pulse 94   Temp 98 F (36.7 C) (Oral)   Resp 10   Ht 5' 4"$  (1.626 m)   Wt 113.4 kg   LMP 09/18/2022 (Approximate)   SpO2 97%   BMI 42.91 kg/m  Physical Exam General: Uncomfortable appearing female, lying in bed.  HEENT: PERRLA, Sclera anicteric, MMM, trachea midline.  Cardiology: RRR, no murmurs/rubs/gallops. BL  radial and DP pulses equal bilaterally.  Resp: Normal respiratory rate and effort. CTAB, no wheezes, rhonchi, crackles.  Abd: Soft, non-tender, non-distended. No rebound tenderness or guarding.  GU: Deferred. MSK: Significant diffuse TTP to R shoulder with visible stepoff. Humeral head palpable in axilla. NVI RUE with R shoulder ROM limited d/t pain. No deformity or TTP to R clavicle. No cyanosis or clubbing. Skin: warm, dry.  Neuro: A&Ox4, CNs II-XII grossly intact. MAEs. Sensation grossly intact.  Psych: Normal mood and affect.   ED Results / Procedures / Treatments   Labs (all labs ordered are listed, but only abnormal results are displayed) Labs Reviewed - No data to display  EKG None  Radiology DG Shoulder Right Portable  Result Date: 10/08/2022 CLINICAL DATA:  Right shoulder injury while working out. EXAM: RIGHT SHOULDER - 1 VIEW COMPARISON:  Right shoulder x-rays dated September 19, 2022. FINDINGS: Recurrent anterior dislocation of the humeral head with respect to the glenoid. No obvious fracture. Soft tissues are unremarkable. IMPRESSION: 1. Recurrent anterior shoulder dislocation. Electronically Signed   By: WTitus DubinM.D.   On: 10/08/2022 14:15    Procedures .Sedation  Date/Time: 10/08/2022 1:49 PM  Performed by: NAudley Hose MD Authorized by: NAudley Hose MD   Consent:    Consent obtained:  Verbal   Consent given by:  Patient  Risks discussed:  Allergic reaction, dysrhythmia, inadequate sedation, nausea, vomiting, respiratory compromise necessitating ventilatory assistance and intubation, prolonged sedation necessitating reversal and prolonged hypoxia resulting in organ damage   Alternatives discussed:  Analgesia without sedation Universal protocol:    Immediately prior to procedure, a time out was called: yes     Patient identity confirmed:  Arm band Indications:    Procedure performed:  Dislocation reduction   Procedure necessitating sedation performed  by:  Physician performing sedation Pre-sedation assessment:    Time since last food or drink:  6.5 hours   ASA classification: class 1 - normal, healthy patient     Mouth opening:  2 finger widths   Thyromental distance:  2 finger widths   Mallampati score:  III - soft palate, base of uvula visible   Neck mobility: normal     Pre-sedation assessments completed and reviewed: pre-procedure airway patency not reviewed, pre-procedure cardiovascular function not reviewed, pre-procedure hydration status not reviewed, pre-procedure mental status not reviewed, pre-procedure nausea and vomiting status not reviewed, pre-procedure pain level not reviewed, pre-procedure respiratory function not reviewed and pre-procedure temperature not reviewed     Pre-sedation assessment completed:  10/08/2022 2:00 PM Immediate pre-procedure details:    Reassessment: Patient reassessed immediately prior to procedure     Reviewed: vital signs and NPO status     Verified: bag valve mask available, emergency equipment available, intubation equipment available, IV patency confirmed, oxygen available, reversal medications available and suction available   Procedure details (see MAR for exact dosages):    Preoxygenation:  Room air   Sedation:  Ketamine   Intended level of sedation: moderate (conscious sedation)   Analgesia:  Morphine   Intra-procedure monitoring:  Cardiac monitor, continuous pulse oximetry, frequent vital sign checks, frequent LOC assessments, continuous capnometry and blood pressure monitoring   Intra-procedure events: none     Total Provider sedation time (minutes):  12 Post-procedure details:    Attendance: Constant attendance by certified staff until patient recovered     Recovery: Patient returned to pre-procedure baseline     Patient is stable for discharge or admission: yes     Procedure completion:  Tolerated well, no immediate complications Reduction of dislocation  Date/Time: 10/08/2022 1:50  PM  Performed by: Audley Hose, MD Authorized by: Audley Hose, MD  Consent: Verbal consent obtained. Risks and benefits: risks, benefits and alternatives were discussed Consent given by: patient Patient understanding: patient states understanding of the procedure being performed Patient consent: the patient's understanding of the procedure matches consent given Procedure consent: procedure consent matches procedure scheduled Site marked: the operative site was marked Imaging studies: imaging studies available Required items: required blood products, implants, devices, and special equipment available Patient identity confirmed: verbally with patient Time out: Immediately prior to procedure a "time out" was called to verify the correct patient, procedure, equipment, support staff and site/side marked as required. Local anesthesia used: no  Anesthesia: Local anesthesia used: no  Sedation: Patient sedated: yes Sedation type: moderate (conscious) sedation Sedatives: morphine Analgesia: ketamine Sedation start date/time: 10/08/2022 2:25 PM Sedation end date/time: 10/08/2022 2:36 PM Vitals: Vital signs were monitored during sedation.  Patient tolerance: patient tolerated the procedure well with no immediate complications       Medications Ordered in ED Medications  propofol (DIPRIVAN) 10 mg/mL bolus/IV push 56.7 mg (56.7 mg Intravenous Not Given 10/08/22 1440)  ondansetron (ZOFRAN) injection 4 mg (4 mg Intravenous Given 10/08/22 1358)  ketamine 50 mg in normal saline 5 mL (  10 mg/mL) syringe (57 mg Intravenous Given 10/08/22 1426)    ED Course/ Medical Decision Making/ A&P                          Medical Decision Making Amount and/or Complexity of Data Reviewed Radiology: ordered.  Risk Prescription drug management.    This patient presents to the ED for concern of R shoulder dislocation, this involves an extensive number of treatment options, and is a complaint that  carries with it a high risk of complications and morbidity.   MDM:    Patient with c/f R shoulder dislocation while doing lat pulldowns in the gym - overhead arm workouts w/ prior R shoulder dislocation high risk for a recurrent dislocation. Patient states she did not know what not to do. She did not f/u with orthopedic surgery.  Patient has no specific tenderness over the acromioclavicular joint with no deformity or clavicular tenderness, low concern for [facility] joint separation or clavicle fracture.  She had no fall or tenderness to the humerus specifically, low concern for humeral fracture.  X-ray demonstrates anterior dislocation with no fracture.  Patient Had plan for sedation with Ketofol however after 0.5 mg/kg of ketamine was pushed patient was sufficiently sedated. Performed shoulder reduction w/ traction counter traction with the assistance of Jon the ortho tech. Patient placed in splint immediately and XR shows good reduction.  Clinical Course as of 10/08/22 1511  Thu Oct 08, 2022  1511 Patient reevaluated post reduction with good alignment. NVI post-procedure and patient taking PO, stating she feels well.  Discussed with patient to utilize a sling at all times until she follows up with orthopedic surgery which she had not done yet.  She is given information to call the clinic and reports understanding.  I relayed to the patient did not do any over this shoulder exercises with her arms and that she can go to the gym as long as she keeps her shoulder in the sling.  Patient is discharged with discharge instructions and return precautions, all questions answered to patient satisfaction. [HN]    Clinical Course User Index [HN] Audley Hose, MD    Imaging Studies ordered: I ordered imaging studies including Shoulder XR I independently visualized and interpreted imaging. I agree with the radiologist interpretation  Additional history obtained from chart review.    Cardiac Monitoring: The  patient was maintained on a cardiac monitor.  I personally viewed and interpreted the cardiac monitored which showed an underlying rhythm of: NSR  Reevaluation: After the interventions noted above, I reevaluated the patient and found that they have :improved  Social Determinants of Health: Patient lives independently   Disposition:  DC  Co morbidities that complicate the patient evaluation  Past Medical History:  Diagnosis Date   Chlamydia    Gonorrhea      Medicines Meds ordered this encounter  Medications   ondansetron (ZOFRAN) injection 4 mg   propofol (DIPRIVAN) 10 mg/mL bolus/IV push 56.7 mg   ketamine 50 mg in normal saline 5 mL (10 mg/mL) syringe    I have reviewed the patients home medicines and have made adjustments as needed  Problem List / ED Course: Problem List Items Addressed This Visit   None Visit Diagnoses     Anterior dislocation of right shoulder, initial encounter    -  Primary                   This note  was created using dictation software, which may contain spelling or grammatical errors.    Audley Hose, MD 10/08/22 [phone]

## 2022-10-08 NOTE — Discharge Instructions (Addendum)
Thank you for coming to [facility] Emergency Department. You were seen for recurrent anterior shoulder dislocation. It was reduced with sedation.  Please wear the sling at all times until you are evaluated by orthopedic surgery. You can call [phone]  to schedule an appointment. You can alternate taking Tylenol and ibuprofen as needed for pain. You can take 658m tylenol (acetaminophen) every 4-6 hours, and 600 mg ibuprofen 3 times a day.  Do not hesitate to return to the ED or call 911 if you experience: -Worsening symptoms -numbness/tingling in the arm -Lightheadedness, passing out -Fevers/chills -Anything else that concerns you

## 2023-10-28 ENCOUNTER — Emergency Department (HOSPITAL_COMMUNITY)
Admission: EM | Admit: 2023-10-28 | Discharge: 2023-10-28 | Disposition: A | Discharge status: Other Acute Inpatient Hospital | Payer: Self-pay | Attending: Student | Admitting: Student

## 2023-10-28 ENCOUNTER — Encounter (HOSPITAL_COMMUNITY): Payer: Self-pay

## 2023-10-28 ENCOUNTER — Other Ambulatory Visit: Payer: Self-pay

## 2023-10-28 ENCOUNTER — Encounter (HOSPITAL_COMMUNITY): Payer: Self-pay | Admitting: Psychiatry

## 2023-10-28 ENCOUNTER — Inpatient Hospital Stay (HOSPITAL_COMMUNITY)
Admission: AD | Admit: 2023-10-28 | Discharge: 2023-11-04 | DRG: 885 | Disposition: A | Discharge status: Home / Self Care | Source: Intra-hospital | Attending: Psychiatry | Admitting: Psychiatry

## 2023-10-28 ENCOUNTER — Emergency Department (HOSPITAL_COMMUNITY): Payer: Self-pay

## 2023-10-28 DIAGNOSIS — F17210 Nicotine dependence, cigarettes, uncomplicated: Secondary | ICD-10-CM | POA: Diagnosis present

## 2023-10-28 DIAGNOSIS — F329 Major depressive disorder, single episode, unspecified: Secondary | ICD-10-CM | POA: Insufficient documentation

## 2023-10-28 DIAGNOSIS — F3163 Bipolar disorder, current episode mixed, severe, without psychotic features: Principal | ICD-10-CM | POA: Diagnosis present

## 2023-10-28 DIAGNOSIS — F603 Borderline personality disorder: Secondary | ICD-10-CM | POA: Diagnosis present

## 2023-10-28 DIAGNOSIS — F17290 Nicotine dependence, other tobacco product, uncomplicated: Secondary | ICD-10-CM | POA: Diagnosis present

## 2023-10-28 DIAGNOSIS — S61512A Laceration without foreign body of left wrist, initial encounter: Secondary | ICD-10-CM | POA: Insufficient documentation

## 2023-10-28 DIAGNOSIS — R45851 Suicidal ideations: Secondary | ICD-10-CM | POA: Diagnosis present

## 2023-10-28 DIAGNOSIS — X838XXD Intentional self-harm by other specified means, subsequent encounter: Secondary | ICD-10-CM | POA: Diagnosis not present

## 2023-10-28 DIAGNOSIS — F17200 Nicotine dependence, unspecified, uncomplicated: Secondary | ICD-10-CM | POA: Insufficient documentation

## 2023-10-28 DIAGNOSIS — F22 Delusional disorders: Secondary | ICD-10-CM | POA: Diagnosis present

## 2023-10-28 DIAGNOSIS — F419 Anxiety disorder, unspecified: Secondary | ICD-10-CM | POA: Insufficient documentation

## 2023-10-28 DIAGNOSIS — S61512D Laceration without foreign body of left wrist, subsequent encounter: Secondary | ICD-10-CM

## 2023-10-28 DIAGNOSIS — Z818 Family history of other mental and behavioral disorders: Secondary | ICD-10-CM

## 2023-10-28 DIAGNOSIS — Z560 Unemployment, unspecified: Secondary | ICD-10-CM

## 2023-10-28 DIAGNOSIS — F29 Unspecified psychosis not due to a substance or known physiological condition: Secondary | ICD-10-CM | POA: Insufficient documentation

## 2023-10-28 DIAGNOSIS — R8290 Unspecified abnormal findings in urine: Secondary | ICD-10-CM | POA: Insufficient documentation

## 2023-10-28 DIAGNOSIS — F333 Major depressive disorder, recurrent, severe with psychotic symptoms: Secondary | ICD-10-CM

## 2023-10-28 DIAGNOSIS — W260XXA Contact with knife, initial encounter: Secondary | ICD-10-CM | POA: Insufficient documentation

## 2023-10-28 LAB — COMPREHENSIVE METABOLIC PANEL
ALT: 24 U/L (ref 0–44)
AST: 22 U/L (ref 15–41)
Albumin: 4.2 g/dL (ref 3.5–5.0)
Alkaline Phosphatase: 72 U/L (ref 38–126)
Anion gap: 9 (ref 5–15)
BUN: 9 mg/dL (ref 6–20)
CO2: 21 mmol/L — ABNORMAL LOW (ref 22–32)
Calcium: 9.1 mg/dL (ref 8.9–10.3)
Chloride: 108 mmol/L (ref 98–111)
Creatinine, Ser: 0.75 mg/dL (ref 0.44–1.00)
GFR, Estimated: >60 mL/min (ref >60)
Glucose, Bld: 111 mg/dL — ABNORMAL HIGH (ref 70–99)
Potassium: 3.7 mmol/L (ref 3.5–5.1)
Sodium: 138 mmol/L (ref 135–145)
Total Bilirubin: 1 mg/dL (ref 0.0–1.2)
Total Protein: 7.5 g/dL (ref 6.5–8.1)

## 2023-10-28 LAB — CBC WITH DIFFERENTIAL/PLATELET
Abs Immature Granulocytes: 0.03 10*3/uL (ref 0.00–0.07)
Basophils Absolute: 0 10*3/uL (ref 0.0–0.1)
Basophils Relative: 1 %
Eosinophils Absolute: 0.1 10*3/uL (ref 0.0–0.5)
Eosinophils Relative: 1 %
HCT: 40.5 % (ref 36.0–46.0)
Hemoglobin: 12.9 g/dL (ref 12.0–15.0)
Immature Granulocytes: 1 %
Lymphocytes Relative: 13 %
Lymphs Abs: 0.9 10*3/uL (ref 0.7–4.0)
MCH: 31.9 pg (ref 26.0–34.0)
MCHC: 31.9 g/dL (ref 30.0–36.0)
MCV: 100 fL (ref 80.0–100.0)
Monocytes Absolute: 0.5 10*3/uL (ref 0.1–1.0)
Monocytes Relative: 7 %
Neutro Abs: 5.2 10*3/uL (ref 1.7–7.7)
Neutrophils Relative %: 77 %
Platelets: 208 10*3/uL (ref 150–400)
RBC: 4.05 MIL/uL (ref 3.87–5.11)
RDW: 13.6 % (ref 11.5–15.5)
WBC: 6.6 10*3/uL (ref 4.0–10.5)
nRBC: 0 % (ref 0.0–0.2)

## 2023-10-28 LAB — URINALYSIS, ROUTINE W REFLEX MICROSCOPIC
Bilirubin Urine: NEGATIVE
Glucose, UA: NEGATIVE mg/dL
Ketones, ur: 80 mg/dL — AB
Leukocytes,Ua: NEGATIVE
Nitrite: POSITIVE — AB
Protein, ur: 100 mg/dL — AB
Specific Gravity, Urine: 1.033 — ABNORMAL HIGH (ref 1.005–1.030)
pH: 5 (ref 5.0–8.0)

## 2023-10-28 LAB — RAPID URINE DRUG SCREEN, HOSP PERFORMED
Amphetamines: NOT DETECTED
Barbiturates: NOT DETECTED
Benzodiazepines: NOT DETECTED
Cocaine: NOT DETECTED
Opiates: NOT DETECTED
Tetrahydrocannabinol: POSITIVE — AB

## 2023-10-28 LAB — ACETAMINOPHEN LEVEL: Acetaminophen (Tylenol), Serum: <10 ug/mL — ABNORMAL LOW (ref 10–30)

## 2023-10-28 LAB — SALICYLATE LEVEL: Salicylate Lvl: <7 mg/dL — ABNORMAL LOW (ref 7.0–30.0)

## 2023-10-28 MED ORDER — LORAZEPAM 2 MG/ML IJ SOLN: 2.0000 mg | Freq: Three times a day (TID) | INTRAMUSCULAR | Status: DC | PRN

## 2023-10-28 MED ORDER — HALOPERIDOL LACTATE 5 MG/ML IJ SOLN: 10.0000 mg | Freq: Three times a day (TID) | INTRAMUSCULAR | Status: DC | PRN

## 2023-10-28 MED ORDER — MAGNESIUM HYDROXIDE 400 MG/5ML PO SUSP: 30.0000 mL | Freq: Every day | ORAL | Status: DC | PRN

## 2023-10-28 MED ORDER — HALOPERIDOL 5 MG PO TABS
5.0000 mg | ORAL_TABLET | Freq: Three times a day (TID) | ORAL | Status: DC | PRN
  Administered 2023-10-29 (×2): 5 mg via ORAL
  Filled 2023-10-28 (×3): qty 1

## 2023-10-28 MED ORDER — ACETAMINOPHEN 325 MG PO TABS
650.0000 mg | ORAL_TABLET | Freq: Four times a day (QID) | ORAL | Status: DC | PRN
  Administered 2023-10-30 – 2023-11-02 (×3): 650 mg via ORAL
  Filled 2023-10-28 (×3): qty 2

## 2023-10-28 MED ORDER — HALOPERIDOL LACTATE 5 MG/ML IJ SOLN: 5.0000 mg | Freq: Three times a day (TID) | INTRAMUSCULAR | Status: DC | PRN

## 2023-10-28 MED ORDER — OLANZAPINE 5 MG PO TBDP
5.0000 mg | ORAL_TABLET | Freq: Two times a day (BID) | ORAL | Status: DC
Start: 2023-10-28 — End: 2023-10-29
  Administered 2023-10-28 – 2023-10-29 (×2): 5 mg via ORAL
  Filled 2023-10-28 (×9): qty 1

## 2023-10-28 MED ORDER — HYDROXYZINE HCL 25 MG PO TABS
25.0000 mg | ORAL_TABLET | Freq: Three times a day (TID) | ORAL | Status: DC | PRN
  Administered 2023-10-29: 25 mg via ORAL
  Filled 2023-10-28 (×2): qty 1
  Filled 2023-10-28: qty 10
  Filled 2023-10-28: qty 1
  Filled 2023-10-28: qty 10

## 2023-10-28 MED ORDER — OLANZAPINE 5 MG PO TBDP
5.0000 mg | ORAL_TABLET | Freq: Two times a day (BID) | ORAL | Status: DC
  Administered 2023-10-28: 5 mg via ORAL
  Filled 2023-10-28: qty 1

## 2023-10-28 MED ORDER — DIPHENHYDRAMINE HCL 25 MG PO CAPS
50.0000 mg | ORAL_CAPSULE | Freq: Three times a day (TID) | ORAL | Status: DC | PRN
  Administered 2023-10-29 (×2): 50 mg via ORAL
  Filled 2023-10-28 (×3): qty 2

## 2023-10-28 MED ORDER — TETANUS-DIPHTH-ACELL PERTUSSIS 5-2.5-18.5 LF-MCG/0.5 IM SUSY
0.5000 mL | PREFILLED_SYRINGE | Freq: Once | INTRAMUSCULAR | Status: DC
  Filled 2023-10-28: qty 0.5

## 2023-10-28 MED ORDER — ALUM & MAG HYDROXIDE-SIMETH 200-200-20 MG/5ML PO SUSP: 30.0000 mL | ORAL | Status: DC | PRN

## 2023-10-28 MED ORDER — HYDROXYZINE HCL 25 MG PO TABS
25.0000 mg | ORAL_TABLET | Freq: Three times a day (TID) | ORAL | Status: DC | PRN
  Administered 2023-10-28: 25 mg via ORAL
  Filled 2023-10-28: qty 1

## 2023-10-28 MED ORDER — LIDOCAINE-EPINEPHRINE (PF) 2 %-1:200000 IJ SOLN
10.0000 mL | Freq: Once | INTRAMUSCULAR | Status: DC
  Filled 2023-10-28: qty 20

## 2023-10-28 MED ORDER — DIPHENHYDRAMINE HCL 50 MG/ML IJ SOLN: 50.0000 mg | Freq: Three times a day (TID) | INTRAMUSCULAR | Status: DC | PRN

## 2023-10-28 MED ORDER — FOSFOMYCIN TROMETHAMINE 3 G PO PACK
3.0000 g | PACK | Freq: Once | ORAL | Status: AC
  Administered 2023-10-28: 3 g via ORAL
  Filled 2023-10-28: qty 3

## 2023-10-28 MED ORDER — TRAZODONE HCL 50 MG PO TABS
50.0000 mg | ORAL_TABLET | Freq: Every evening | ORAL | Status: DC | PRN
  Administered 2023-10-28 – 2023-10-29 (×2): 50 mg via ORAL
  Filled 2023-10-28 (×2): qty 1

## 2023-10-28 MED ORDER — NICOTINE 14 MG/24HR TD PT24
14.0000 mg | MEDICATED_PATCH | Freq: Every day | TRANSDERMAL | Status: DC
  Filled 2023-10-28 (×6): qty 1
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
## 2023-10-28 NOTE — ED Notes (Signed)
 Pt just got up randomly screaming "no,no,no" seems in distress crying loudly, [employer] on standby

## 2023-10-28 NOTE — [facility] Group Notes (Signed)
 [facility] Group Notes:  (Nursing/MHT/Case Management/Adjunct)  Date:  10/28/2023  Time:  10:21 PM  Type of Therapy:  Psychoeducational Skills  Participation Level:  Did Not Attend  Participation Quality:  Resistant  Affect:  Resistant  Cognitive:  Lacking  Insight:  None  Engagement in Group:  None  Modes of Intervention:  Education  Summary of Progress/Problems: The patient did not attend group this evening.   Hazle Coca S 10/28/2023, 10:21 PM

## 2023-10-28 NOTE — ED Notes (Signed)
 [facility] called pts mother for collateral information. [facility] left a HIPAA complaint message to return the call.   Jacquelynn Cree, [facility]  10/28/23

## 2023-10-28 NOTE — ED Notes (Signed)
 Patient has been sitting in her room on her bed coloring ever since she took her bandages off.

## 2023-10-28 NOTE — Progress Notes (Signed)
 [facility] LCSW Progress Note   10/28/2023    7:45 PM  Kareena Clark-Jackson   [phone]   Type of Contact and Topic:  Psychiatric Bed Placement   Pt accepted to [facility] 402-2   Patient meets inpatient criteria per Alona Bene, [facility]  The attending provider will be Dr. Sherron Flemings   Call report to [phone]    Vivi Ferns, RN @ [facility] ED notified.     Pt scheduled  to arrive at [facility] TODAY AFTER 2000.    Damita Dunnings, MSW, LCSW-A  7:46 PM 10/28/2023

## 2023-10-28 NOTE — Progress Notes (Signed)
 The patient has been coming out of her bedroom on a regular basis and shuts the door after looking out into the hallway. Most recently, she came out into the hallway and just leaned up against the wall. She stated that she wanted to have her vital signs checked and that their was something going on with her brain. She did not go into further detail. When the nurse approached her to talk , she returned to her room and shut the door and also refused all medication.

## 2023-10-28 NOTE — ED Notes (Signed)
 Patient entered bathroom and when she came out she had removed the dressings and  coban from around her arm and I asked her why she removed them and she said because she was going to be rewrapping them.I told her she should not have taken her dressing off because she is going to start bleeding again. I told the nurse and she told her she would re wrap them.

## 2023-10-28 NOTE — Progress Notes (Signed)
 this patient needs to go to sappu...Lauren KitchenMarland Kitchenthe tech watching the monitor can keep a close eye on them for their stitches   Im not taking her with a wound unless her sitter comes with her, not with the telesitter.   10/28/2023  Advised charge RN that patient was not a good candidate foe SAPPU with new wound even though it had been stitched because she may open the wound and tele monitor is not the best to view patient while watching four other patients. Per Consulting civil engineer the house [facility] told her to move patient to SAPPU. Patient was not in SAPPU 30 minutes and her wrap on her arm was already off. Charge and [facility] notified.

## 2023-10-28 NOTE — ED Provider Notes (Signed)
 [place] EMERGENCY DEPARTMENT AT [facility] Provider Note  CSN: [id number] Arrival date & time: 10/28/23 1018  Chief Complaint(s) Suicidal  HPI Lauren Poole is a 33 y.o. female who presents emergency room for evaluation of an active suicide attempt.  Patient took a kitchen knife today and cut her left wrist.  Bleeding controlled on arrival but wound is gaping with obvious tendon involvement.  She voices to this provider that she is "going to die today" and is still actively suicidal.  Speech mildly pressured but able to answer most questions appropriately.  Denies coingestion.  Denies homicidal ideation or auditory/visual hallucinations.   Past Medical History Past Medical History:  Diagnosis Date   Chlamydia    Gonorrhea    There are no active problems to display for this patient.  Home Medication(s) Prior to Admission medications   Medication Sig Start Date End Date Taking? Authorizing Provider  HYDROcodone-acetaminophen (NORCO/VICODIN) 5-325 MG tablet Take 1 tablet by mouth every 4 (four) hours as needed. 09/19/22   Jacalyn Lefevre, MD  ibuprofen (ADVIL) 600 MG tablet Take 1 tablet (600 mg total) by mouth every 6 (six) hours as needed. 09/19/22   Jacalyn Lefevre, MD                                                                                                                                    Past Surgical History Past Surgical History:  Procedure Laterality Date   NO PAST SURGERIES     Family History Family History  Problem Relation Age of Onset   Schizophrenia Sister     Social History Social History   Tobacco Use   Smoking status: Light Smoker   Smokeless tobacco: Never  Vaping Use   Vaping status: Never Used  Substance Use Topics   Alcohol use: Yes   Drug use: No   Allergies Patient has no known allergies.  Review of Systems Review of Systems  Skin:  Positive for wound.  Psychiatric/Behavioral:  Positive for suicidal ideas.      Physical Exam Vital Signs  I have reviewed the triage vital signs BP 124/85 (BP Location: Right Arm)   Pulse 78   Temp 98.9 F (37.2 C) (Oral)   Resp 18   Ht 5\' 4"  (1.626 m)   Wt 113.4 kg   SpO2 99%   BMI 42.91 kg/m   Physical Exam Vitals and nursing note reviewed.  Constitutional:      General: She is not in acute distress.    Appearance: She is well-developed.  HENT:     Head: Normocephalic and atraumatic.  Eyes:     Conjunctiva/sclera: Conjunctivae normal.  Cardiovascular:     Rate and Rhythm: Normal rate and regular rhythm.     Heart sounds: No murmur heard. Pulmonary:     Effort: Pulmonary effort is normal. No respiratory distress.     Breath sounds: Normal breath sounds.  Abdominal:  Palpations: Abdomen is soft.     Tenderness: There is no abdominal tenderness.  Musculoskeletal:        General: No swelling.     Cervical back: Neck supple.  Skin:    General: Skin is warm and dry.     Capillary Refill: Capillary refill takes less than 2 seconds.     Findings: Lesion present.  Neurological:     Mental Status: She is alert.  Psychiatric:        Mood and Affect: Mood normal.      ED Results and Treatments Labs (all labs ordered are listed, but only abnormal results are displayed) Labs Reviewed  COMPREHENSIVE METABOLIC PANEL - Abnormal; Notable for the following components:      Result Value   CO2 21 (*)    Glucose, Bld 111 (*)    All other components within normal limits  URINALYSIS, ROUTINE W REFLEX MICROSCOPIC - Abnormal; Notable for the following components:   Color, Urine AMBER (*)    APPearance CLOUDY (*)    Specific Gravity, Urine 1.033 (*)    Hgb urine dipstick LARGE (*)    Ketones, ur 80 (*)    Protein, ur 100 (*)    Nitrite POSITIVE (*)    Bacteria, UA MANY (*)    All other components within normal limits  RAPID URINE DRUG SCREEN, HOSP PERFORMED - Abnormal; Notable for the following components:   Tetrahydrocannabinol POSITIVE (*)     All other components within normal limits  SALICYLATE LEVEL - Abnormal; Notable for the following components:   Salicylate Lvl <7.0 (*)    All other components within normal limits  ACETAMINOPHEN LEVEL - Abnormal; Notable for the following components:   Acetaminophen (Tylenol), Serum <10 (*)    All other components within normal limits  CBC WITH DIFFERENTIAL/PLATELET                                                                                                                          Radiology No results found.  Pertinent labs & imaging results that were available during my care of the patient were reviewed by me and considered in my medical decision making (see MDM for details).  Medications Ordered in ED Medications  lidocaine-EPINEPHrine (XYLOCAINE W/EPI) 2 %-1:200000 (PF) injection 10 mL (has no administration in time range)  Tdap (BOOSTRIX) injection 0.5 mL (has no administration in time range)  fosfomycin (MONUROL) packet 3 g (has no administration in time range)  Procedures .Laceration Repair  Date/Time: 10/28/2023 1:01 PM  Performed by: Glendora Score, MD Authorized by: Glendora Score, MD   Anesthesia:    Anesthesia method:  Local infiltration   Local anesthetic:  Lidocaine 2% WITH epi Laceration details:    Location:  Hand   Hand location:  L wrist   Length (cm):  5 Treatment:    Amount of cleaning:  Standard   Irrigation solution:  Sterile saline   Irrigation volume:  1000   Irrigation method:  Pressure wash Skin repair:    Repair method:  Sutures   Suture size:  3-0   Suture material:  Prolene   Suture technique:  Simple interrupted and horizontal mattress   Number of sutures:  4 Approximation:    Approximation:  Close Repair type:    Repair type:  Simple Post-procedure details:    Dressing:  Non-adherent dressing    Procedure completion:  Tolerated well, no immediate complications   (including critical care time)  Medical Decision Making / ED Course   This patient presents to the ED for concern of suicidal ideation, wrist laceration, this involves an extensive number of treatment options, and is a complaint that carries with it a high risk of complications and morbidity.  The differential diagnosis includes laceration, tendon injury, suicidal ideation, psychosis, metabolic encephalopathy, substance use  MDM: Patient seen emergency room for evaluation of suicidal ideation and a self-inflicted wrist laceration.  Physical exam with a 5 cm deep laceration to the left wrist but neurologic exam is unremarkable and patient has full range of motion of the hand.  Laboratory evaluation largely unremarkable outside of a urinalysis with evidence of possible UTI which was treated with fosfomycin and UDS positive for THC.  X-ray imaging without fracture.  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 Kerry Fort for reevaluation, wound check and suture removal.  Laceration repaired with 3-0 Prolene.  Tdap updated.  At this time patient is medically clear for psychiatric evaluation.  Disposition pending TTS evaluation.  IVC placed for active suicide attempt.   Additional history obtained:  -External records from outside source obtained and reviewed including: Chart review including previous notes, labs, imaging, consultation notes   Lab Tests: -I ordered, reviewed, and interpreted labs.   The pertinent results include:   Labs Reviewed  COMPREHENSIVE METABOLIC PANEL - Abnormal; Notable for the following components:      Result Value   CO2 21 (*)    Glucose, Bld 111 (*)    All other components within  normal limits  URINALYSIS, ROUTINE W REFLEX MICROSCOPIC - Abnormal; Notable for the following components:   Color, Urine AMBER (*)    APPearance CLOUDY (*)    Specific Gravity, Urine 1.033 (*)    Hgb urine dipstick LARGE (*)    Ketones, ur 80 (*)    Protein, ur 100 (*)    Nitrite POSITIVE (*)    Bacteria, UA MANY (*)    All other components within normal limits  RAPID URINE DRUG SCREEN, HOSP PERFORMED - Abnormal; Notable for the following components:   Tetrahydrocannabinol POSITIVE (*)    All other components within normal limits  SALICYLATE LEVEL - Abnormal; Notable for the following components:   Salicylate Lvl <7.0 (*)    All other components  within normal limits  ACETAMINOPHEN LEVEL - Abnormal; Notable for the following components:   Acetaminophen (Tylenol), Serum <10 (*)    All other components within normal limits  CBC WITH DIFFERENTIAL/PLATELET      Imaging Studies ordered: I ordered imaging studies including wrist x-ray I independently visualized and interpreted imaging. I agree with the radiologist interpretation   Medicines ordered and prescription drug management: Meds ordered this encounter  Medications   lidocaine-EPINEPHrine (XYLOCAINE W/EPI) 2 %-1:200000 (PF) injection 10 mL   Tdap (BOOSTRIX) injection 0.5 mL   fosfomycin (MONUROL) packet 3 g    -I have reviewed the patients home medicines and have made adjustments as needed  Critical interventions none  Consultations Obtained: I requested consultation with the TTS providers,  and discussed lab and imaging findings as well as pertinent plan - they recommend: Recommendations currently pending, anticipate admission  Social Determinants of Health:  Factors impacting patients care include: none   Reevaluation: After the interventions noted above, I reevaluated the patient and found that they have :improved  Co morbidities that complicate the patient evaluation  Past Medical History:  Diagnosis Date    Chlamydia    Gonorrhea       Dispostion: I considered admission for this patient, and disposition pending TTS evaluation.  Anticipate admission.     Final Clinical Impression(s) / ED Diagnoses Final diagnoses:  None     @PCDICTATION @    Glendora Score, MD 10/28/23 1306
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
## 2023-10-28 NOTE — Progress Notes (Signed)
 10/28/2023  1906  Spoke with patient and asked if she was willing to go to [facility]. Patient states she will go to [facility]. Patient is aware she will have a roommate.

## 2023-10-29 ENCOUNTER — Encounter (HOSPITAL_COMMUNITY): Payer: Self-pay

## 2023-10-29 DIAGNOSIS — F3163 Bipolar disorder, current episode mixed, severe, without psychotic features: Principal | ICD-10-CM

## 2023-10-29 MED ORDER — OLANZAPINE 5 MG PO TBDP
5.0000 mg | ORAL_TABLET | Freq: Every day | ORAL | Status: DC
  Administered 2023-10-30: 5 mg via ORAL
  Filled 2023-10-29 (×3): qty 1

## 2023-10-29 MED ORDER — SULFAMETHOXAZOLE-TRIMETHOPRIM 800-160 MG PO TABS
1.0000 | ORAL_TABLET | Freq: Two times a day (BID) | ORAL | Status: AC
  Administered 2023-10-29 – 2023-11-03 (×10): 1 via ORAL
  Filled 2023-10-29 (×10): qty 1

## 2023-10-29 MED ORDER — OLANZAPINE 10 MG PO TBDP
10.0000 mg | ORAL_TABLET | Freq: Every day | ORAL | Status: DC
  Administered 2023-10-29 – 2023-10-30 (×2): 10 mg via ORAL
  Filled 2023-10-29 (×4): qty 1
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
## 2023-10-29 NOTE — Plan of Care (Signed)
  Problem: Education: Goal: Knowledge of [facility] Education information/materials will improve Outcome: Not Progressing Goal: Emotional status will improve Outcome: Not Progressing Goal: Mental status will improve Outcome: Not Progressing   

## 2023-10-29 NOTE — Group Note (Signed)
 Recreation Therapy Group Note   Group Topic:Team Building  Group Date: 10/29/2023 Start Time: 0930 End Time: 1000 Facilitators: Alonda Weaber-McCall, LRT,CTRS Location: [address] Dayroom   Group Topic: Communication, Team Building, Problem Solving  Goal Area(s) Addresses:  Patient will effectively work with peer towards shared goal.  Patient will identify skills used to make activity successful.  Patient will identify how skills used during activity can be applied to reach post d/c goals.   Intervention: STEM Activity- Glass blower/designer  Activity: Tallest [employer]. In teams of 5-6, patients were given 11 craft pipe cleaners. Using the materials provided, patients were instructed to compete again the opposing team(s) to build the tallest free-standing structure from floor level. The activity was timed; difficulty increased by Clinical research associate as Production designer, theatre/television/film continued.  Systematically resources were removed with additional directions for example, placing one arm behind their back, working in silence, and shape stipulations. LRT facilitated post-activity discussion reviewing team processes and necessary communication skills involved in completion. Patients were encouraged to reflect how the skills utilized, or not utilized, in this activity can be incorporated to positively impact support systems post discharge.  Education: Pharmacist, community, Scientist, physiological, Discharge Planning   Education Outcome: Acknowledges education/In group clarification offered/Needs additional education.   Affect/Mood: N/A   Participation Level: Did not attend    Clinical Observations/Individualized Feedback:      Plan: Continue to engage patient in RT group sessions 2-3x/week.   Ronnita Paz-McCall, LRT,CTRS 10/29/2023 1:10 PM

## 2023-10-29 NOTE — Progress Notes (Signed)
 Medications   Patient endorses anxiety refused Hydroxizine, was trying to spit Olanzapine required redirections. Will require mouth checks with medications. Compliant with Trazodone. " I need this one I have not been sleeping well for some days" Patient continues to pace after taking medications required lots of redirection. Q 15 minutes safety checks ongoing.

## 2023-10-29 NOTE — Group Note (Unsigned)
 Date:  10/29/2023 Time:  9:43 PM  Group Topic/Focus:  AA     Participation Level:  {[facility] PARTICIPATION WUJWJ:19147}  Participation Quality:  {[facility] PARTICIPATION QUALITY:22265}  Affect:  {[facility] AFFECT:22266}  Cognitive:  {[facility] COGNITIVE:22267}  Insight: {BHH Insight2:20797}  Engagement in Group:  {B[facility] ENGAGEMENT IN WGNFA:21308}  Modes of Intervention:  {BHH MODES OF INTERVENTION:22269}  Additional Comments:  ***  Scot Dock 10/29/2023, 9:43 PM

## 2023-10-29 NOTE — [facility] Group Notes (Signed)
 [facility] Group Notes:  (Nursing/MHT/Case Management/Adjunct)  Date:  10/29/2023  Time:  2000  Type of Therapy:   wrap up group  Participation Level:  Active  Participation Quality:  Appropriate and Attentive  Affect:  Appropriate  Cognitive:  Alert and Appropriate  Insight:  Appropriate and Good  Engagement in Group:  Engaged  Modes of Intervention:  Discussion  Summary of Progress/Problems:  Fay Records 10/29/2023, 9:12 PM

## 2023-10-29 NOTE — Progress Notes (Signed)
 The pt approached writer at the nurse's station and asked for a pen and paper. Writer told the pt that I could not give them a pen but I could give them a pencil and paper. Pt attempted to come behind the nurse's station and stated that they would use a marker that was behind the nurse's station. Writer verbally redirected patient not to come behind the nurse's station. Pt expressed that they wanted to write their dying wishes to their mother, so they could not use a pencil for that. Pt then asked writer if they would like to write this letter for them. Writer declined to write pt's dying wishes and asked pt if they have gotten any sleep tonight, in which pt stated that they did. Writer reminded pt of the time and encouraged them to get some more rest. Pt returned to their room.

## 2023-10-29 NOTE — Tx Team (Signed)
 Initial Treatment Plan 10/29/2023 1:02 AM Webb Laws [id number]    PATIENT STRESSORS: Medication change or noncompliance   Substance abuse     PATIENT STRENGTHS: Capable of independent living  General fund of knowledge    PATIENT IDENTIFIED PROBLEMS: "I smoke marijuana everyday"  "I crushed my car"  "I cut my wrist"                 DISCHARGE CRITERIA:  Improved stabilization in mood, thinking, and/or behavior Medical problems require only outpatient monitoring Motivation to continue treatment in a less acute level of care Need for constant or close observation no longer present Reduction of life-threatening or endangering symptoms to within safe limits Verbal commitment to aftercare and medication compliance  PRELIMINARY DISCHARGE PLAN: Outpatient therapy Return to previous living arrangement Return to previous work or school arrangements  PATIENT/FAMILY INVOLVEMENT: This treatment plan has been presented to and reviewed with the patient, Lauren Poole, and/or family member.  The patient and family have been given the opportunity to ask questions and make suggestions.  Margarita Rana, RN 10/29/2023, 1:02 AM

## 2023-10-29 NOTE — Progress Notes (Signed)
 Pt approached nursing station paranoid, mild agitation - confused, irritable, easily annoyed or angered, intolerant "by the energy; its the energy, I can hear my mom voice, do you hear the screaming".   10/29/23 2239  [place] Violence Checklist (Document every shift between 4 & 7)  Confusion 1  Irritable 1  Boisterous 0  Physical threats 0  Verbal threats 0  Attacking objects 0  Total Score 2  Violence Risk Category Moderate Risk  Violence Prevention Guidelines *See Row Information* Moderate Violence Risk interventions implemented  Violence Risk Additional Interventions Diversional activity;Decreased stimulation;Supportive listening;Medication  BHAI Shift Assessment (Document every shift between 4 & 7)  What level of intervention did this patient need during the current shift? Level 2 - Moderate Interventions  In the next shift do you anticipate this patient will need: Level 2 - Moderate Interventions

## 2023-10-29 NOTE — Progress Notes (Signed)
 The patient came out of her bedroom covered up with her bed sheet and bedspread and told this Thereasa Parkin that she wanted "it turned off". She verbalized that she did not want to be "gassed". Patient was verbally redirected to go back to sleep.

## 2023-10-29 NOTE — [facility] Suicide Risk Assessment (Signed)
 Suicide Risk Assessment  Admission Assessment    [facility] Admission Suicide Risk Assessment   Nursing information obtained from:  Patient Demographic factors:  Living alone, Adolescent or young adult Current Mental Status:  Suicidal ideation indicated by others Loss Factors:  NA Historical Factors:  Impulsivity Risk Reduction Factors:  Employed  Total Time spent with patient: 30 minutes Principal Problem: Bipolar 1 disorder, mixed, severe (HCC) Diagnosis:  Principal Problem:   Bipolar 1 disorder, mixed, severe (HCC)  Subjective Data: Lauren Poole reports experiencing significant stress related to her mental health. She describes auditory hallucinations, ongoing relationship difficulties, and worsening depressive symptoms.  Lauren Poole states these challenges recently led to a suicide attempt, during which she inflicted a laceration on her left wrist.   Continued Clinical Symptoms:  Alcohol Use Disorder Identification Test Final Score (AUDIT): 2 The "Alcohol Use Disorders Identification Test", Guidelines for Use in Primary Care, Second Edition.  World Science writer [facility]). Score between 0-7:  no or low risk or alcohol related problems. Score between 8-15:  moderate risk of alcohol related problems. Score between 16-19:  high risk of alcohol related problems. Score 20 or above:  warrants further diagnostic evaluation for alcohol dependence and treatment.   CLINICAL FACTORS:   Depression:   Delusional Hopelessness Impulsivity Insomnia Severe Previous Psychiatric Diagnoses and Treatments   Musculoskeletal: Strength & Muscle Tone: within normal limits Gait & Station: normal Patient leans: N/A  Psychiatric Specialty Exam:  Presentation  General Appearance: Disheveled  Eye Contact:Fleeting  Speech:Blocked; Normal Rate  Speech Volume:Normal  Handedness:-- (Did not assess)   Mood and Affect  Mood:Dysphoric  Affect:Depressed; Flat   Thought Process  Thought  Processes:Coherent  Descriptions of Associations:Intact  Orientation:Full (Time, Place and Person)  Thought Content:Paranoid Ideation  History of Schizophrenia/Schizoaffective disorder:No  Duration of Psychotic Symptoms:N/A  Hallucinations:Hallucinations: Auditory Description of Auditory Hallucinations: Reports hearing voices and tiny musical sounds.  Ideas of Reference:Paranoia; Percusatory  Suicidal Thoughts:Suicidal Thoughts: Yes, Passive SI Passive Intent and/or Plan: Without Intent; Without Plan (Patient is able to contract for safety within the hospital.)  Homicidal Thoughts:Homicidal Thoughts: No   Sensorium  Memory:Immediate Fair  Judgment:Poor  Insight:Poor   Executive Functions  Concentration:Fair  Attention Span:Fair  Recall:Fair  Fund of Knowledge:Fair  Language:Fair   Psychomotor Activity  Psychomotor Activity:Psychomotor Activity: Restlessness   Assets  Assets:Communication Skills; Housing; Physical Health; Resilience; Social Support   Sleep  Sleep:Sleep: Poor    Physical Exam: Physical Exam Vitals and nursing note reviewed.  Constitutional:      General: She is not in acute distress.    Appearance: She is obese. She is not ill-appearing.  Cardiovascular:     Rate and Rhythm: Normal rate.     Pulses: Normal pulses.  Pulmonary:     Effort: No respiratory distress.  Neurological:     Mental Status: She is alert and oriented to person, place, and time.    See H&P Review of Systems  Constitutional: Negative.   Respiratory:  Negative for shortness of breath.   Cardiovascular:  Negative for chest pain and palpitations.  Gastrointestinal:  Negative for constipation, diarrhea, nausea and vomiting.  Neurological:  Negative for dizziness, tremors and headaches.  Psychiatric/Behavioral:  Positive for depression, hallucinations, substance abuse and suicidal ideas. The patient is nervous/anxious and has insomnia.    See H&P Blood  pressure 130/83, pulse 84, temperature 97.6 F (36.4 C), temperature source Oral, resp. rate 16, height 5\' 4"  (1.626 m), weight 113 kg, SpO2 99%. Body mass index is 42.76  kg/m.   COGNITIVE FEATURES THAT CONTRIBUTE TO RISK:  Closed-mindedness    SUICIDE RISK:   Severe:  Frequent, intense, and enduring suicidal ideation, specific plan, no subjective intent, but some objective markers of intent (i.e., choice of lethal method), the method is accessible, some limited preparatory behavior, evidence of impaired self-control, severe dysphoria/symptomatology, multiple risk factors present, and few if any protective factors, particularly a lack of social support.  PLAN OF CARE: See H&P   I certify that inpatient services furnished can reasonably be expected to improve the patient's condition.   Norma Fredrickson, NP 10/29/2023, 5:07 PM

## 2023-10-29 NOTE — Progress Notes (Signed)
 The patient came out of her bedroom a few minutes and began to look around. She asked this Thereasa Parkin if she could use his Ipad or use the telephone. The patient was reminded that it was late at night and she returned to her bedroom.

## 2023-10-29 NOTE — BH IP Treatment Plan (Signed)
 Interdisciplinary Treatment and Diagnostic Plan Update  10/29/2023 Time of Session: 10:45 AM Lauren Poole MRN: [id number]  Principal Diagnosis: Bipolar 1 disorder, mixed, severe (HCC)  Secondary Diagnoses: Principal Problem:   Bipolar 1 disorder, mixed, severe (HCC)   Current Medications:  Current Facility-Administered Medications  Medication Dose Route Frequency Provider Last Rate Last Admin   acetaminophen (TYLENOL) tablet 650 mg  650 mg Oral Q6H PRN Motley-Mangrum, Jadeka A, PMHNP       alum & mag hydroxide-simeth (MAALOX/MYLANTA) 200-200-20 MG/5ML suspension 30 mL  30 mL Oral Q4H PRN Motley-Mangrum, Jadeka A, PMHNP       haloperidol (HALDOL) tablet 5 mg  5 mg Oral TID PRN Motley-Mangrum, Jadeka A, PMHNP   5 mg at 10/29/23 0914   And   diphenhydrAMINE (BENADRYL) capsule 50 mg  50 mg Oral TID PRN Motley-Mangrum, Jadeka A, PMHNP   50 mg at 10/29/23 0914   haloperidol lactate (HALDOL) injection 5 mg  5 mg Intramuscular TID PRN Motley-Mangrum, Jadeka A, PMHNP       And   diphenhydrAMINE (BENADRYL) injection 50 mg  50 mg Intramuscular TID PRN Motley-Mangrum, Jadeka A, PMHNP       And   LORazepam (ATIVAN) injection 2 mg  2 mg Intramuscular TID PRN Motley-Mangrum, Jadeka A, PMHNP       haloperidol lactate (HALDOL) injection 10 mg  10 mg Intramuscular TID PRN Motley-Mangrum, Jadeka A, PMHNP       And   diphenhydrAMINE (BENADRYL) injection 50 mg  50 mg Intramuscular TID PRN Motley-Mangrum, Jadeka A, PMHNP       And   LORazepam (ATIVAN) injection 2 mg  2 mg Intramuscular TID PRN Motley-Mangrum, Geralynn Ochs A, PMHNP       hydrOXYzine (ATARAX) tablet 25 mg  25 mg Oral TID PRN Motley-Mangrum, Jadeka A, PMHNP   25 mg at 10/29/23 0653   magnesium hydroxide (MILK OF MAGNESIA) suspension 30 mL  30 mL Oral Daily PRN Motley-Mangrum, Jadeka A, PMHNP       nicotine (NICODERM CQ - dosed in mg/24 hours) patch 14 mg  14 mg Transdermal Daily Sindy Guadeloupe, NP       OLANZapine zydis (ZYPREXA)  disintegrating tablet 10 mg  10 mg Oral QHS Bennett, Christal H, NP       [START ON 10/30/2023] OLANZapine zydis (ZYPREXA) disintegrating tablet 5 mg  5 mg Oral Daily Bennett, Christal H, NP       sulfamethoxazole-trimethoprim (BACTRIM DS) 800-160 MG per tablet 1 tablet  1 tablet Oral Q12H Bennett, Christal H, NP       Tdap (BOOSTRIX) injection 0.5 mL  0.5 mL Intramuscular Once Motley-Mangrum, Jadeka A, PMHNP       traZODone (DESYREL) tablet 50 mg  50 mg Oral QHS PRN Motley-Mangrum, Jadeka A, PMHNP   50 mg at 10/28/23 2230   PTA Medications: No medications prior to admission.    Patient Stressors: Medication change or noncompliance   Substance abuse    Patient Strengths: Capable of independent living  General fund of knowledge   Treatment Modalities: Medication Management, Group therapy, Case management,  1 to 1 session with clinician, Psychoeducation, Recreational therapy.   Physician Treatment Plan for Primary Diagnosis: Bipolar 1 disorder, mixed, severe (HCC) Long Term Goal(s): Improvement in symptoms so as ready for discharge   Short Term Goals: Ability to identify changes in lifestyle to reduce recurrence of condition will improve Ability to verbalize feelings will improve Ability to disclose and discuss suicidal ideas Ability to demonstrate self-control  will improve Ability to identify and develop effective coping behaviors will improve Ability to maintain clinical measurements within normal limits will improve Ability to identify triggers associated with substance abuse/mental health issues will improve  Medication Management: Evaluate patient's response, side effects, and tolerance of medication regimen.  Therapeutic Interventions: 1 to 1 sessions, Unit Group sessions and Medication administration.  Evaluation of Outcomes: Not Progressing  Physician Treatment Plan for Secondary Diagnosis: Principal Problem:   Bipolar 1 disorder, mixed, severe (HCC)  Long Term Goal(s):  Improvement in symptoms so as ready for discharge   Short Term Goals: Ability to identify changes in lifestyle to reduce recurrence of condition will improve Ability to verbalize feelings will improve Ability to disclose and discuss suicidal ideas Ability to demonstrate self-control will improve Ability to identify and develop effective coping behaviors will improve Ability to maintain clinical measurements within normal limits will improve Ability to identify triggers associated with substance abuse/mental health issues will improve     Medication Management: Evaluate patient's response, side effects, and tolerance of medication regimen.  Therapeutic Interventions: 1 to 1 sessions, Unit Group sessions and Medication administration.  Evaluation of Outcomes: Not Progressing   RN Treatment Plan for Primary Diagnosis: Bipolar 1 disorder, mixed, severe (HCC) Long Term Goal(s): Knowledge of disease and therapeutic regimen to maintain health will improve  Short Term Goals: Ability to remain free from injury will improve, Ability to verbalize frustration and anger appropriately will improve, Ability to demonstrate self-control, Ability to participate in decision making will improve, Ability to verbalize feelings will improve, Ability to disclose and discuss suicidal ideas, Ability to identify and develop effective coping behaviors will improve, and Compliance with prescribed medications will improve  Medication Management: RN will administer medications as ordered by provider, will assess and evaluate patient's response and provide education to patient for prescribed medication. RN will report any adverse and/or side effects to prescribing provider.  Therapeutic Interventions: 1 on 1 counseling sessions, Psychoeducation, Medication administration, Evaluate responses to treatment, Monitor vital signs and CBGs as ordered, Perform/monitor CIWA, COWS, AIMS and Fall Risk screenings as ordered, Perform  wound care treatments as ordered.  Evaluation of Outcomes: Not Progressing   LCSW Treatment Plan for Primary Diagnosis: Bipolar 1 disorder, mixed, severe (HCC) Long Term Goal(s): Safe transition to appropriate next level of care at discharge, Engage patient in therapeutic group addressing interpersonal concerns.  Short Term Goals: Engage patient in aftercare planning with referrals and resources, Increase social support, Increase ability to appropriately verbalize feelings, Increase emotional regulation, Facilitate acceptance of mental health diagnosis and concerns, Facilitate patient progression through stages of change regarding substance use diagnoses and concerns, Identify triggers associated with mental health/substance abuse issues, and Increase skills for wellness and recovery  Therapeutic Interventions: Assess for all discharge needs, 1 to 1 time with Social worker, Explore available resources and support systems, Assess for adequacy in community support network, Educate family and significant other(s) on suicide prevention, Complete Psychosocial Assessment, Interpersonal group therapy.  Evaluation of Outcomes: Not Progressing   Progress in Treatment: Attending groups: No. Participating in groups: No. Taking medication as prescribed: Yes Toleration medication: Yes. Family/Significant other contact made: consents are pending Patient understands diagnosis: No. Discussing patient identified problems/goals with staff: No Medical problems stabilized or resolved: Yes Denies suicidal/homicidal ideation: Yes Issues/concerns per patient self-inventory: No.  New problem(s) identified: No  New Short Term/Long Term Goal(s):     medication stabilization, elimination of SI thoughts, development of comprehensive mental wellness plan.   Patient Goals:  "  I don't want any goals."  Patient declined participation in the treatment team.   Discharge Plan or Barriers:  Patient recently admitted.  CSW will continue to follow and assess for appropriate referrals and possible discharge planning.   Reason for Continuation of Hospitalization: Depression Medication stabilization Suicidal ideation  Estimated Length of Stay:  5 - 7 days  Last 3 [place] Suicide Severity Risk Score: Flowsheet Row [facility] (Current[facility] Most recent reading at 10/28/2023  9:00 PM ED from 10/28/2023 in [facility] Department at [facility] Most recent reading at 10/28/2023 10:31 AM ED from 10/08/2022 in [facility] at [facility] Most recent reading at 10/08/2022  2:41 PM  C-SSRS RISK CATEGORY High Risk High Risk No Risk       Last PHQ 2/9 Scores:     No data to display          Scribe for Treatment Team: Nyra Jabs 10/29/2023 3:43 PM

## 2023-10-29 NOTE — Progress Notes (Signed)
 PT keeps walking up to staff asking "What was the question?" She thinks somebody asked her a question about her sins. Thinks everyone on the unit is laughing at her. She then went to her room, packed up all her belongings, and asked "where is the exit because I'm tired of yall experimenting on me".

## 2023-10-29 NOTE — Progress Notes (Signed)
 Patient noted to be agitated, accusing staff of lying to her. Pt noted to be wearing her roommate's clothes, pt difficult to redirect. Haldol 5 mg po, Benadryl 50 mg po and scheduled Zyprexa administered. Pt observed to be calmer at this time . Pt moved to 500 hall for continuation of care. Safety maintained.  10/29/23 0910  Psych Admission Type (Psych Patients Only)  Admission Status Involuntary  Psychosocial Assessment  Patient Complaints Anxiety;Confusion  Eye Contact Brief  Facial Expression Anxious;Worried;Wide-eyed;Angry  Affect Anxious;Irritable;Preoccupied;Threatening  Speech Pressured;Tangential;Argumentative  Interaction Forwards little;Guarded;Demanding  Surveyor, quantity;Restless  Appearance/Hygiene Body odor  Behavior Characteristics Anxious;Impulsive;Intrusive;Irritable  Mood Anxious;Angry;Preoccupied;Threatening;Irritable  Thought Process  Coherency Disorganized;Flight of ideas;Tangential  Content Blaming others;Paranoia  Delusions Paranoid;Persecutory  Perception Hallucinations (Staff observed pt responding to internal stimuli)  Hallucination Auditory  Judgment Poor  Confusion Moderate  Danger to Self  Current suicidal ideation? Denies  Agreement Not to Harm Self Yes  Description of Agreement Verbal  Danger to Others  Danger to Others None reported or observed

## 2023-10-29 NOTE — Progress Notes (Signed)
   10/29/23 1515  Spiritual Encounters  Type of Visit Initial  Care provided to: Patient  Referral source Patient request  Reason for visit Routine spiritual support   Per patient request, I provided a New Testament and let her know of chaplain availability for listening, prayer, or other needs.  Ms. Laramee was resting and did not wish to engage at this time but seemed to appreciate receiving the text.  Sula Fetterly L. Sophronia Simas, M.Div [phone]
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
## 2023-10-29 NOTE — Group Note (Signed)
 Date:  10/29/2023 Time:  10:22 AM  Group Topic/Focus:  Goals Group:   The focus of this group is to help patients establish daily goals to achieve during treatment and discuss how the patient can incorporate goal setting into their daily lives to aide in recovery. Orientation:   The focus of this group is to educate the patient on the purpose and policies of crisis stabilization and provide a format to answer questions about their admission.  The group details unit policies and expectations of patients while admitted.    Participation Level:  Did Not Attend  Participation Quality:   n/a  Affect:   n/a  Cognitive:   n/a  Insight: None  Engagement in Group:   n/a  Modes of Intervention:   n/a  Additional Comments:   Did not attend  Stark Bray 10/29/2023, 10:22 AM

## 2023-10-29 NOTE — Progress Notes (Signed)
 Nurse went to talk with Patient in her room Patient stated she was about to sleep stated "you came to help me write my wishes right" When Patient asked if she was feeling safe  or having any Suicidal thought she denied and contracted for safety. Patient statement keeps changing. Her thought process is disorganized. "I wanted to take a shower I have not showed in days oh no please you can leave I am sleeping" Within minutes Patient was out of her room pacing. Patient refused any prn medications.   Support and encouragement ongoing. Q 15 minutes safety checks provided. Will continue to monitor.

## 2023-10-29 NOTE — Plan of Care (Signed)
   Problem: Safety: Goal: Periods of time without injury will increase Outcome: Progressing

## 2023-10-29 NOTE — Plan of Care (Signed)
 Pt denies SI/HI/AVH and pain, but this nurse observed pt responding to external stimuli. Pt is paranoid, education provided, encouragement needed to comply with med administration. Pt was offered support and encouragement. Pt was encourage to attend groups. Q 15 minute checks performed/ongoing. Pt attended group and interacts interacted well with peers and staff. Pt has no complaints at this time. Pt resistant to treatment, but with redirection and reorientation compliant and safety maintained on unit.  Problem: Education: Goal: Knowledge of [facility] Education information/materials will improve 10/29/2023 2319 by Cira Servant, RN Outcome: Not Progressing 10/29/2023 2319 by Cira Servant, RN Outcome: Progressing Goal: Emotional status will improve 10/29/2023 2319 by Cira Servant, RN Outcome: Not Progressing 10/29/2023 2319 by Cira Servant, RN Outcome: Progressing Goal: Mental status will improve 10/29/2023 2319 by Cira Servant, RN Outcome: Not Progressing 10/29/2023 2319 by Cira Servant, RN Outcome: Progressing Goal: Verbalization of understanding the information provided will improve 10/29/2023 2319 by Cira Servant, RN Outcome: Not Progressing 10/29/2023 2319 by Cira Servant, RN Outcome: Progressing   Problem: Activity: Goal: Interest or engagement in activities will improve 10/29/2023 2319 by Cira Servant, RN Outcome: Not Progressing 10/29/2023 2319 by Cira Servant, RN Outcome: Progressing Goal: Sleeping patterns will improve 10/29/2023 2319 by Cira Servant, RN Outcome: Not Progressing 10/29/2023 2319 by Cira Servant, RN Outcome: Progressing   Problem: Health Behavior/Discharge Planning: Goal: Identification of resources available to assist in meeting health care needs will improve 10/29/2023 2319 by Cira Servant, RN Outcome: Not  Progressing 10/29/2023 2319 by Gwynneth Albright T, RN Outcome: Progressing Goal: Compliance with treatment plan for underlying cause of condition will improve 10/29/2023 2319 by Cira Servant, RN Outcome: Not Progressing 10/29/2023 2319 by Gwynneth Albright T, RN Outcome: Progressing   Problem: Safety: Goal: Periods of time without injury will increase 10/29/2023 2319 by Cira Servant, RN Outcome: Not Progressing 10/29/2023 2319 by Gwynneth Albright T, RN Outcome: Progressing   Problem: Safety: Goal: Periods of time without injury will increase 10/29/2023 2319 by Cira Servant, RN Outcome: Not Progressing 10/29/2023 2319 by Cira Servant, RN Outcome: Progressing

## 2023-10-30 DIAGNOSIS — F3163 Bipolar disorder, current episode mixed, severe, without psychotic features: Secondary | ICD-10-CM | POA: Diagnosis not present

## 2023-10-30 DIAGNOSIS — R8290 Unspecified abnormal findings in urine: Secondary | ICD-10-CM | POA: Insufficient documentation

## 2023-10-30 MED ORDER — OLANZAPINE 10 MG PO TBDP
10.0000 mg | ORAL_TABLET | Freq: Every day | ORAL | Status: DC
  Administered 2023-10-31: 10 mg via ORAL
  Filled 2023-10-30 (×3): qty 1

## 2023-10-30 MED ORDER — TRAZODONE HCL 50 MG PO TABS
50.0000 mg | ORAL_TABLET | Freq: Every day | ORAL | Status: DC
  Administered 2023-10-30 – 2023-11-03 (×5): 50 mg via ORAL
  Filled 2023-10-30 (×9): qty 1

## 2023-10-30 NOTE — Plan of Care (Signed)
  Problem: Activity: Goal: Interest or engagement in activities will improve Outcome: Progressing   Problem: Physical Regulation: Goal: Ability to maintain clinical measurements within normal limits will improve Outcome: Progressing   Problem: Safety: Goal: Periods of time without injury will increase Outcome: Progressing

## 2023-10-30 NOTE — Progress Notes (Signed)
   10/30/23 2045  Psych Admission Type (Psych Patients Only)  Admission Status Involuntary  Psychosocial Assessment  Patient Complaints Anxiety  Eye Contact Fair  Facial Expression Anxious  Affect Anxious;Appropriate to circumstance  Speech Logical/coherent  Interaction Cautious;Guarded  Motor Activity Restless  Appearance/Hygiene Unremarkable  Behavior Characteristics Cooperative  Mood Anxious;Pleasant  Aggressive Behavior  Effect Self-harm (laceration to lower left arm)  Thought Process  Coherency Circumstantial  Content Preoccupation  Delusions Paranoid  Perception Hallucinations  Hallucination Auditory  Judgment Poor  Confusion Mild  Danger to Self  Current suicidal ideation? Denies

## 2023-10-30 NOTE — Progress Notes (Signed)
   10/30/23 0600  15 Minute Checks  Location Bedroom  Visual Appearance Calm  Behavior Composed  Sleep (Behavioral Health Patients Only)  Calculate sleep? (Click Yes once per 24 hr at 0600 safety check) Yes  Documented sleep last 24 hours 7

## 2023-10-30 NOTE — Progress Notes (Signed)
 Required agitation protocol by mouth d/t bizarre behaviors, AH, and agitated d/t "the energy, I feel like everyone is after me, and I'm not safe."  10/29/23 2100  Psych Admission Type (Psych Patients Only)  Admission Status Involuntary  Psychosocial Assessment  Patient Complaints Anxiety;Confusion  Eye Contact Darting  Facial Expression Animated;Anxious;Worried  Affect Anxious;Fearful;Inconsistent with thought content;Preoccupied  Speech Incoherent;Pressured;Soft;Slow;Slurred;Tangential  Interaction Guarded;Minimal;Sarcastic  Motor Activity Fidgety;Rigidity  Appearance/Hygiene Unremarkable  Behavior Characteristics Anxious;Fidgety;Guarded  Mood Anxious;Preoccupied  Aggressive Behavior  Effect Self-harm (Prior to admission)  Thought Process  Coherency Circumstantial;Disorganized;Loose associations  Content Blaming others;Preoccupation;Paranoia  Delusions Paranoid;Persecutory  Perception Hallucinations  Hallucination Auditory  Judgment Poor  Confusion Moderate  Danger to Self  Current suicidal ideation? Denies  Agreement Not to Harm Self Yes  Description of Agreement Verbal  Danger to Others  Danger to Others None reported or observed

## 2023-10-30 NOTE — BH Assessment (Signed)
 Pt refused lab work, "All night you all tried to harvest my organs, and now you want my blood; I am not doing anything until the sun comes up!"   This nurse redirected the pt to the room and provided education on the need for lab work for doctors to review this am.

## 2023-10-30 NOTE — Group Note (Signed)
 Date:  10/30/2023 Time:  8:40 PM  Group Topic/Focus:  Wrap-Up Group:   The focus of this group is to help patients review their daily goal of treatment and discuss progress on daily workbooks.    Participation Level:  Did Not Attend   Scot Dock 10/30/2023, 8:40 PM

## 2023-10-30 NOTE — Progress Notes (Addendum)
 [facility] MD Progress Note  10/30/2023 3:07 PM Lauren Poole  MRN:  [id number]  Principal Problem: Bipolar 1 disorder, mixed, severe (HCC) Diagnosis: Principal Problem:   Bipolar 1 disorder, mixed, severe (HCC) Active Problems:   Abnormal urinalysis   Reason for Admission:  Lauren Poole, a 33 year-old African American who initially presented to the [facility] ED on 10/28/2023 following an active suicide attempt. She reportedly used a kitchen knife to cut her left wrist, resulting in a gaping wound with tendon involvement, though bleeding was controlled upon arrival. At the time, she expressed a desire to die and was actively suicidal, with mildly pressured speech noted. She has a prior psychiatric history significant of MDD, mood disorder and alcohol abuse. She was admitted to [facility] on 10/28/2023 for further stabilization and treatment. (admitted on 10/28/2023, total  LOS: 2 days )  Chart Review from last 24 hours:  The patient's chart was reviewed and nursing notes were reviewed. The patient's case was discussed in multidisciplinary team meeting.   - Overnight events to report per chart review / staff report:  moderate agitation requiring PRN - Patient received all scheduled medications - Patient received the following PRN medications: 9 AM Agitation protocol and 10 PM agitation protocol  Information Obtained Today During Patient Interview: The patient was seen and evaluated in her room. She had put a towel to cover the gap underneath the door and was talking to herself while near the window. On assessment today the patient was irritable and brief in responses. She multiple times stated "it doesn't matter" to my inquiries. She asked "are we done" multiple times. She asked for me to "hurry up with whatever needs to happen". After explaining the reason I was there, she still was curt with answers. I discussed that I would come back tomorrow to see if she would like  to talk more. She denies SI/HI/AVH but has been seen on multiple instances responding to internal stimuli. She had refused blood draw as she expresed "you all tried to harvest my organs and now you want my blood".   She has attended some groups.  Patient endorses fair sleep; endorses fair appetite.  Patient does not endorse any side-effects they attribute to medications.  Past Psychiatric History: Per chart review patient was seen by Dr. Lolly Mustache on 09/28/2018 for depression and mood swings.    Past Medical History:  Past Medical History:  Diagnosis Date   Chlamydia    Gonorrhea     Family Psychiatric History: sister schizophrenia   Current Medications: Current Facility-Administered Medications  Medication Dose Route Frequency Provider Last Rate Last Admin   acetaminophen (TYLENOL) tablet 650 mg  650 mg Oral Q6H PRN Motley-Mangrum, Jadeka A, PMHNP       alum & mag hydroxide-simeth (MAALOX/MYLANTA) 200-200-20 MG/5ML suspension 30 mL  30 mL Oral Q4H PRN Motley-Mangrum, Jadeka A, PMHNP       haloperidol (HALDOL) tablet 5 mg  5 mg Oral TID PRN Motley-Mangrum, Jadeka A, PMHNP   5 mg at 10/29/23 2239   And   diphenhydrAMINE (BENADRYL) capsule 50 mg  50 mg Oral TID PRN Motley-Mangrum, Jadeka A, PMHNP   50 mg at 10/29/23 2239   haloperidol lactate (HALDOL) injection 5 mg  5 mg Intramuscular TID PRN Motley-Mangrum, Jadeka A, PMHNP       And   diphenhydrAMINE (BENADRYL) injection 50 mg  50 mg Intramuscular TID PRN Motley-Mangrum, Geralynn Ochs A, PMHNP       And   LORazepam (  ATIVAN) injection 2 mg  2 mg Intramuscular TID PRN Motley-Mangrum, Jadeka A, PMHNP       haloperidol lactate (HALDOL) injection 10 mg  10 mg Intramuscular TID PRN Motley-Mangrum, Jadeka A, PMHNP       And   diphenhydrAMINE (BENADRYL) injection 50 mg  50 mg Intramuscular TID PRN Motley-Mangrum, Jadeka A, PMHNP       And   LORazepam (ATIVAN) injection 2 mg  2 mg Intramuscular TID PRN Motley-Mangrum, Geralynn Ochs A, PMHNP        hydrOXYzine (ATARAX) tablet 25 mg  25 mg Oral TID PRN Motley-Mangrum, Jadeka A, PMHNP   25 mg at 10/29/23 0653   magnesium hydroxide (MILK OF MAGNESIA) suspension 30 mL  30 mL Oral Daily PRN Motley-Mangrum, Jadeka A, PMHNP       nicotine (NICODERM CQ - dosed in mg/24 hours) patch 14 mg  14 mg Transdermal Daily Sindy Guadeloupe, NP       OLANZapine zydis (ZYPREXA) disintegrating tablet 10 mg  10 mg Oral QHS Bennett, Christal H, NP   10 mg at 10/29/23 2100   OLANZapine zydis (ZYPREXA) disintegrating tablet 5 mg  5 mg Oral Daily Bennett, Christal H, NP   5 mg at 10/30/23 0842   sulfamethoxazole-trimethoprim (BACTRIM DS) 800-160 MG per tablet 1 tablet  1 tablet Oral Q12H Bennett, Christal H, NP   1 tablet at 10/30/23 0842   Tdap (BOOSTRIX) injection 0.5 mL  0.5 mL Intramuscular Once Motley-Mangrum, Jadeka A, PMHNP       traZODone (DESYREL) tablet 50 mg  50 mg Oral QHS PRN Motley-Mangrum, Jadeka A, PMHNP   50 mg at 10/29/23 2100    Lab Results: No results found for this or any previous visit (from the past 48 hours).  Blood Alcohol level:  No results found for: "ETH"  Metabolic Labs: No results found for: "HGBA1C", "MPG" No results found for: "PROLACTIN" No results found for: "CHOL", "TRIG", "HDL", "CHOLHDL", "VLDL", "LDLCALC"  Physical Findings: AIMS: No  CIWA:    COWS:     Psychiatric Specialty Exam: General Appearance: Disheveled   Eye Contact: Fleeting   Speech: Blocked; Normal Rate   Volume: Normal   Mood: Dysphoric   Affect: Depressed; Flat   Thought Content: Paranoid Ideation   Suicidal Thoughts: Suicidal Thoughts: Yes, Passive SI Passive Intent and/or Plan: Without Intent; Without Plan (Patient is able to contract for safety within the hospital.)   Homicidal Thoughts: Homicidal Thoughts: No   Thought Process: Coherent   Orientation: Full (Time, Place and Person)     Memory: Immediate Fair   Judgment: Poor   Insight: Poor   Concentration: Fair   Recall:  [employer] of Knowledge: Fair   Language: Fair   Psychomotor Activity: Psychomotor Activity: Restlessness   Assets: Manufacturing systems engineer; Housing; Physical Health; Resilience; Social Support   Sleep: Sleep: Poor    Review of Systems ROS  Vital Signs: Blood pressure 110/88, pulse 74, temperature 98.1 F (36.7 C), temperature source Oral, resp. rate 18, height 5\' 4"  (1.626 m), weight 113 kg, SpO2 100%. Body mass index is 42.76 kg/m. Physical Exam  Assets  Assets: Manufacturing systems engineer; Housing; Physical Health; Resilience; Social Support   Treatment Plan Summary: Daily contact with patient to assess and evaluate symptoms and progress in treatment and Medication management  Diagnoses / Active Problems: Bipolar 1 disorder, mixed, severe (HCC) Principal Problem:   Bipolar 1 disorder, mixed, severe (HCC) Active Problems:   Abnormal urinalysis   ASSESSMENT: Patient remains  very paranoid and irritable.  She has required 2 agitation protocol yesterday. We will continue to monitor.  Increased olanzapine to 10 mg twice daily.  PLAN: Safety and Monitoring:  -- Involuntary admission to inpatient psychiatric unit for safety, stabilization and treatment  -- Daily contact with patient to assess and evaluate symptoms and progress in treatment  -- Patient's case to be discussed in multi-disciplinary team meeting  -- Observation Level : q15 minute checks  -- Vital signs:  q12 hours  -- Precautions: suicide, elopement, and assault  2. Interventions (medications, psychoeducation, etc):  Bipolar 1 disorder, mixed, severe  --Increase olanzapine to 10 mg twice daily --Switch trazodone to nightly to aid sleep  Nicotine Dependence Cannabis Use -discontinued NRT as she has refused  Self Inflicted Laceration -consult to wound care for management of laceration -Bactrim ever 12 hours  PRN medications for symptomatic management:              -- continue acetaminophen 650 mg every 6  hours as needed for mild to moderate pain, fever, and headaches              -- continue hydroxyzine 25 mg three times a day as needed for anxiety              -- continue bismuth subsalicylate 524 mg oral chewable tablet every 3 hours as needed for indigestion              -- continue senna 8.6 mg oral at bedtime as needed and polyethylene glycol 17 g oral daily as needed for mild to moderate constipation              -- continue ondansetron 8 mg every 8 hours as needed for nausea or vomiting              -- continue aluminum-magnesium hydroxide + simethicone 30 mL every 4 hours as needed for heartburn  -- As needed agitation protocol in-place  The risks/benefits/side-effects/alternatives to the above medication were discussed in detail with the patient and time was given for questions. The patient consents to medication trial. FDA black box warnings, if present, were discussed.  The patient is agreeable with the medication plan, as above. We will monitor the patient's response to pharmacologic treatment, and adjust medications as necessary.  3. Routine and other pertinent labs:             -- Metabolic profile:  BMI: Body mass index is 42.76 kg/m.  Prolactin: No results found for: "PROLACTIN"  Lipid Panel: No results found for: "CHOL", "TRIG", "HDL", "CHOLHDL", "VLDL", "LDLCALC"  HbgA1c: No results found for: "HGBA1C"  TSH: No results found for: "TSH"  EKG monitoring: QTc: 385  4. Group Therapy:  -- Encouraged patient to participate in unit milieu and in scheduled group therapies   -- Short Term Goals: Ability to identify changes in lifestyle to reduce recurrence of condition, verbalize feelings, identify and develop effective coping behaviors, maintain clinical measurements within normal limits, and identify triggers associated with substance abuse/mental health issues will improve. Improvement in ability to demonstrate self-control and comply with prescribed medications.  --  Long Term Goals: Improvement in symptoms so as ready for discharge -- Patient is encouraged to participate in group therapy while admitted to the psychiatric unit. -- We will address other chronic and acute stressors, which contributed to the patient's Bipolar 1 disorder, mixed, severe (HCC) in order to reduce the risk of self-harm at discharge.  5. Discharge Planning:   --  Social work and case management to assist with discharge planning and identification of hospital follow-up needs prior to discharge  -- Estimated LOS: 7-14 days  -- Discharge Concerns: Need to establish a safety plan; Medication compliance and effectiveness  -- Discharge Goals: Return home with outpatient referrals for mental health follow-up including medication management/psychotherapy  Total Time Spent in Direct Patient Care:  I personally spent 35 minutes on the unit in direct patient care. The direct patient care time included face-to-face time with the patient, reviewing the patient's chart, communicating with other professionals, and coordinating care. Greater than 50% of this time was spent in counseling or coordinating care with the patient regarding goals of hospitalization, psycho-education, and discharge planning needs.  Signed: Park Pope, MD 10/30/2023, 3:07 PM

## 2023-10-31 DIAGNOSIS — F3163 Bipolar disorder, current episode mixed, severe, without psychotic features: Secondary | ICD-10-CM | POA: Diagnosis not present

## 2023-10-31 LAB — VITAMIN D 25 HYDROXY (VIT D DEFICIENCY, FRACTURES): Vit D, 25-Hydroxy: 17.1 ng/mL — ABNORMAL LOW (ref 30–100)

## 2023-10-31 LAB — LIPID PANEL
Cholesterol: 136 mg/dL (ref 0–200)
HDL: 37 mg/dL — ABNORMAL LOW (ref >40)
LDL Cholesterol: 84 mg/dL (ref 0–99)
Total CHOL/HDL Ratio: 3.7 ratio
Triglycerides: 75 mg/dL (ref <150)
VLDL: 15 mg/dL (ref 0–40)

## 2023-10-31 LAB — HEMOGLOBIN A1C
Hgb A1c MFr Bld: 5.1 % (ref 4.8–5.6)
Mean Plasma Glucose: 99.67 mg/dL

## 2023-10-31 MED ORDER — OLANZAPINE 10 MG PO TBDP
10.0000 mg | ORAL_TABLET | Freq: Every day | ORAL | Status: AC
  Administered 2023-10-31: 10 mg via ORAL
  Filled 2023-10-31: qty 1

## 2023-10-31 MED ORDER — OLANZAPINE 15 MG PO TBDP
15.0000 mg | ORAL_TABLET | Freq: Every day | ORAL | Status: DC
  Filled 2023-10-31 (×3): qty 1

## 2023-10-31 MED ORDER — SODIUM CHLORIDE 0.9 % IN NEBU
INHALATION_SOLUTION | RESPIRATORY_TRACT | Status: AC
  Filled 2023-10-31: qty 3

## 2023-10-31 MED ORDER — CHOLECALCIFEROL 10 MCG (400 UNIT) PO TABS
400.0000 [IU] | ORAL_TABLET | Freq: Every day | ORAL | Status: DC
  Administered 2023-11-01: 400 [IU] via ORAL
  Filled 2023-10-31 (×4): qty 1

## 2023-10-31 MED ORDER — OLANZAPINE 5 MG PO TBDP
5.0000 mg | ORAL_TABLET | Freq: Every day | ORAL | Status: DC
  Administered 2023-11-01: 5 mg via ORAL
  Filled 2023-10-31 (×3): qty 1

## 2023-10-31 MED ORDER — NICOTINE POLACRILEX 2 MG MT GUM
2.0000 mg | CHEWING_GUM | OROMUCOSAL | Status: DC | PRN
  Administered 2023-10-31: 2 mg via ORAL
  Filled 2023-10-31: qty 1

## 2023-10-31 NOTE — Progress Notes (Signed)
   10/31/23 2020  Psych Admission Type (Psych Patients Only)  Admission Status Involuntary  Psychosocial Assessment  Patient Complaints None  Eye Contact Fair  Facial Expression Anxious  Affect Anxious;Appropriate to circumstance  Speech Logical/coherent  Interaction Cautious;Guarded  Motor Activity Restless  Appearance/Hygiene Unremarkable  Behavior Characteristics Cooperative;Appropriate to situation;Calm  Mood Pleasant  Aggressive Behavior  Effect Self-harm (laceration to lower left arm)  Thought Process  Coherency Circumstantial  Content Preoccupation  Delusions Paranoid  Perception Hallucinations  Hallucination Auditory  Judgment Poor  Confusion Mild  Danger to Self  Current suicidal ideation? Denies

## 2023-10-31 NOTE — Consult Note (Signed)
 WOC Nurse Consult Note: Reason for Consult: self inflicted laceration Repaired in the ED, 4 sutures Wound type: trauma Pressure Injury POA: NA Measurement: see ED notes Wound [id number] with sutures Drainage (amount, consistency, odor) see nursing notes Periwound: intact  Dressing procedure/placement/frequency: Cover with non adherent dressing or dry dressing, [place] #s provided for 3x3 and 6x6 foam dressing.   Change every 3 days.   Re consult if needed, will not follow at this time. Thanks  Dick Hark [employer], RN,CWOCN, CNS, CWON-AP [phone])

## 2023-10-31 NOTE — Group Note (Signed)
 Date:  10/31/2023 Time:  8:18 PM  Group Topic/Focus:  Wrap-Up Group:   The focus of this group is to help patients review their daily goal of treatment and discuss progress on daily workbooks.    Participation Level:  Active  Participation Quality:  Appropriate  Affect:  Appropriate  Cognitive:  Appropriate  Insight: Appropriate  Engagement in Group:  Developing/Improving  Modes of Intervention:  Discussion  Additional Comments:  Pt stated her goal for today was to focus on her treatment plan and talked with her social worker . Pt stated she accomplished her goals today. Pt stated she talked with and with her social worker about her care today. Pt stated her major concern deals with payment for treatment. Pt stated she just recently lost her employment. Writer informed her to talk with her Child psychotherapist.  Pt stated her mother dropping off some clothes today improved her over all day. Pt rated her overall day a 8 out of 10. Pt stated she was able to contact her mother and a family friend today which improved her overall day. Pt stated she felt better about herself tonight. Pt stated she was able to attend all meals today. Pt stated she took all medications provided today. Pt stated her appetite was pretty good today. Pt rated her sleep last night was good. Pt stated the goal tonight was to get some rest. Pt stated she had no physical pain tonight. Pt deny visual hallucinations and auditory issues tonight. Pt denies thoughts of harming herself or others. Pt stated she would alert staff if anything changed.  Felipa Furnace 10/31/2023, 8:18 PM

## 2023-10-31 NOTE — Plan of Care (Signed)
   Problem: Education: Goal: Emotional status will improve Outcome: Progressing Goal: Mental status will improve Outcome: Progressing Goal: Verbalization of understanding the information provided will improve Outcome: Progressing

## 2023-10-31 NOTE — [facility] Group Notes (Signed)
 [facility] Group Notes:  (Nursing/MHT/Case Management/Adjunct)  Date:  10/31/2023  Time:  10:30  Type of Therapy:  Nurse Education  Participation Level:  Minimal  Participation Quality:  Inattentive  Affect:  Anxious  Cognitive:  Alert  Insight:  Lacking  Engagement in Group:  Lacking and Poor  Modes of Intervention:  Discussion, Orientation, and Support  Summary of Progress/Problems: Pt presents preoccupied, fidgety, restless unable to sustain focus during groups with frequent shifting in chair.   Sherryl Manges 10/31/2023, 10:30

## 2023-10-31 NOTE — Progress Notes (Signed)
 [facility] MD Progress Note  10/31/2023 1:16 PM Lauren Poole  MRN:  [id number]  Principal Problem: Bipolar 1 disorder, mixed, severe (HCC) Diagnosis: Principal Problem:   Bipolar 1 disorder, mixed, severe (HCC) Active Problems:   Abnormal urinalysis   Reason for Admission:  Lauren Poole, a 33 year-old African American who initially presented to the [facility] ED on 10/28/2023 following an active suicide attempt. She reportedly used a kitchen knife to cut her left wrist, resulting in a gaping wound with tendon involvement, though bleeding was controlled upon arrival. At the time, she expressed a desire to die and was actively suicidal, with mildly pressured speech noted. She has a prior psychiatric history significant of MDD, mood disorder and alcohol abuse. She was admitted to [facility] on 10/28/2023 for further stabilization and treatment. (admitted on 10/28/2023, total  LOS: 3 days )  Chart Review from last 24 hours:  The patient's chart was reviewed and nursing notes were reviewed. The patient's case was discussed in multidisciplinary team meeting.   - Overnight events to report per chart review / staff report: no notable overnight events to report - Patient received all scheduled medications - Patient did not receive any PRN medications  Information Obtained Today During Patient Interview: The patient was seen and evaluated in her room.  She appeared less paranoid and was sleeping in bed.  She was initially more conversive but became more irritable as conversation continued.  She denies SI/HI/AVH.  She remains vague in regards to her paranoia. She denies acute somatic complaints from medications besides daytime sedation. She was reluctantly amenable to changing olanzapine to 5 mg in AM and 15 mg in PM but really wanted to go down on her medications. She did allow for blood draw which I praised her for doing. Reports appetite is appropriate.  She has attended some  groups.   Past Psychiatric History: Per chart review patient was seen by Dr. Lolly Mustache on 09/28/2018 for depression and mood swings.    Past Medical History:  Past Medical History:  Diagnosis Date   Chlamydia    Gonorrhea     Family Psychiatric History: sister schizophrenia   Current Medications: Current Facility-Administered Medications  Medication Dose Route Frequency Provider Last Rate Last Admin   acetaminophen (TYLENOL) tablet 650 mg  650 mg Oral Q6H PRN Motley-Mangrum, Jadeka A, PMHNP   650 mg at 10/30/23 2006   alum & mag hydroxide-simeth (MAALOX/MYLANTA) 200-200-20 MG/5ML suspension 30 mL  30 mL Oral Q4H PRN Motley-Mangrum, Jadeka A, PMHNP       haloperidol (HALDOL) tablet 5 mg  5 mg Oral TID PRN Motley-Mangrum, Jadeka A, PMHNP   5 mg at 10/29/23 2239   And   diphenhydrAMINE (BENADRYL) capsule 50 mg  50 mg Oral TID PRN Motley-Mangrum, Jadeka A, PMHNP   50 mg at 10/29/23 2239   haloperidol lactate (HALDOL) injection 5 mg  5 mg Intramuscular TID PRN Motley-Mangrum, Jadeka A, PMHNP       And   diphenhydrAMINE (BENADRYL) injection 50 mg  50 mg Intramuscular TID PRN Motley-Mangrum, Jadeka A, PMHNP       And   LORazepam (ATIVAN) injection 2 mg  2 mg Intramuscular TID PRN Motley-Mangrum, Jadeka A, PMHNP       haloperidol lactate (HALDOL) injection 10 mg  10 mg Intramuscular TID PRN Motley-Mangrum, Jadeka A, PMHNP       And   diphenhydrAMINE (BENADRYL) injection 50 mg  50 mg Intramuscular TID PRN Motley-Mangrum, Ezra Sites, PMHNP  And   LORazepam (ATIVAN) injection 2 mg  2 mg Intramuscular TID PRN Motley-Mangrum, Geralynn Ochs A, PMHNP       hydrOXYzine (ATARAX) tablet 25 mg  25 mg Oral TID PRN Motley-Mangrum, Jadeka A, PMHNP   25 mg at 10/29/23 0653   magnesium hydroxide (MILK OF MAGNESIA) suspension 30 mL  30 mL Oral Daily PRN Motley-Mangrum, Jadeka A, PMHNP       nicotine polacrilex (NICORETTE) gum 2 mg  2 mg Oral PRN Massengill, Harrold Donath, MD       OLANZapine zydis (ZYPREXA)  disintegrating tablet 10 mg  10 mg Oral QHS Bennett, Christal H, NP   10 mg at 10/30/23 2006   OLANZapine zydis (ZYPREXA) disintegrating tablet 10 mg  10 mg Oral Daily Park Pope, MD   10 mg at 10/31/23 0816   sulfamethoxazole-trimethoprim (BACTRIM DS) 800-160 MG per tablet 1 tablet  1 tablet Oral Q12H Bennett, Christal H, NP   1 tablet at 10/31/23 0816   Tdap (BOOSTRIX) injection 0.5 mL  0.5 mL Intramuscular Once Motley-Mangrum, Jadeka A, PMHNP       traZODone (DESYREL) tablet 50 mg  50 mg Oral QHS Park Pope, MD   50 mg at 10/30/23 2038    Lab Results:  Results for orders placed or performed during the hospital encounter of 10/28/23 (from the past 48 hours)  VITAMIN D 25 Hydroxy (Vit-D Deficiency, Fractures)     Status: Abnormal   Collection Time: 10/31/23  6:26 AM  Result Value Ref Range   Vit D, 25-Hydroxy 17.10 (L) 30 - 100 ng/mL    Comment: (NOTE) Vitamin D deficiency has been defined by the Institute of Medicine  and an Endocrine Society practice guideline as a level of serum 25-OH  vitamin D less than 20 ng/mL (1,2). The Endocrine Society went on to  further define vitamin D insufficiency as a level between 21 and 29  ng/mL (2).  1. IOM (Institute of Medicine). 2010. Dietary reference intakes for  calcium and D. [place]: [employer]. 2. Holick MF, [place], Bischoff-Ferrari HA, et al. Evaluation,  treatment, and prevention of vitamin D deficiency: an Endocrine  Society clinical practice guideline, JCEM. 2011 Jul; 96(7): 1911-30.  Performed at [facility], [address], [place], [place] [address]   Hemoglobin A1c     Status: None   Collection Time: 10/31/23  6:26 AM  Result Value Ref Range   Hgb A1c MFr Bld 5.1 4.8 - 5.6 %    Comment: (NOTE) Pre diabetes:          5.7%-6.4%  Diabetes:              >6.4%  Glycemic control for   <7.0% adults with diabetes    Mean Plasma Glucose 99.67 mg/dL    Comment: Performed at [facility], [address], [place], [place] [address]  Lipid panel     Status: Abnormal   Collection Time: 10/31/23  6:26 AM  Result Value Ref Range   Cholesterol 136 0 - 200 mg/dL   Triglycerides 75 <010 mg/dL   HDL 37 (L) >27 mg/dL   Total CHOL/HDL Ratio 3.7 RATIO   VLDL 15 0 - 40 mg/dL   LDL Cholesterol 84 0 - 99 mg/dL    Comment:        Total Cholesterol/HDL:CHD Risk Coronary Heart Disease Risk Table  Men   Women  1/2 Average Risk   3.4   3.3  Average Risk       5.0   4.4  2 X Average Risk   9.6   7.1  3 X Average Risk  23.4   11.0        Use the calculated Patient Ratio above and the CHD Risk Table to determine the patient's CHD Risk.        ATP III CLASSIFICATION (LDL):  <100     mg/dL   Optimal  440-102  mg/dL   Near or Above                    Optimal  130-159  mg/dL   Borderline  725-366  mg/dL   High  >440     mg/dL   Very High Performed at [facility], [address], [place], [place] [address]     Blood Alcohol level:  No results found for: "[facility]"  Metabolic Labs: Lab Results  Component Value Date   HGBA1C 5.1 10/31/2023   MPG 99.67 10/31/2023   No results found for: "PROLACTIN" Lab Results  Component Value Date   CHOL 136 10/31/2023   TRIG 75 10/31/2023   HDL 37 (L) 10/31/2023   CHOLHDL 3.7 10/31/2023   VLDL 15 10/31/2023   LDLCALC 84 10/31/2023    Physical Findings: AIMS: No  CIWA:    COWS:     Psychiatric Specialty Exam: General Appearance: Disheveled   Eye Contact: Minimal   Speech: Garbled   Volume: Decreased   Mood: Dysphoric   Affect: Flat   Thought Content: Paranoid Ideation   Suicidal Thoughts: Suicidal Thoughts: No    Homicidal Thoughts: Homicidal Thoughts: No    Thought Process: Coherent   Orientation: Full (Time, Place and Person)     Memory: Immediate Fair   Judgment: Impaired   Insight: Poor   Concentration: Fair   Recall: [employer] of Knowledge: Fair   Language:  Fair   Psychomotor Activity: Psychomotor Activity: Restlessness    Assets: Manufacturing systems engineer; Housing; Resilience; Social Support   Sleep: Sleep: Fair     Review of Systems ROS  Vital Signs: Blood pressure 112/80, pulse 75, temperature 98.1 F (36.7 C), temperature source Oral, resp. rate 18, height 5\' 4"  (1.626 m), weight 113 kg, SpO2 98%. Body mass index is 42.76 kg/m. Physical Exam  Assets  Assets: Manufacturing systems engineer; Housing; Resilience; Social Support   Treatment Plan Summary: Daily contact with patient to assess and evaluate symptoms and progress in treatment and Medication management  Diagnoses / Active Problems: Bipolar 1 disorder, mixed, severe (HCC) Principal Problem:   Bipolar 1 disorder, mixed, severe (HCC) Active Problems:   Abnormal urinalysis   ASSESSMENT: Patient remains paranoid and internally preoccupied but somewhat better than before.  Due to daytime sedation, we will decrease daytime olanzapine and increase nighttime olanzapine to maintain daily dose of 20 mg but hopefully reduce daytime sedation.  Will continue to monitor for progress. PLAN: Safety and Monitoring:  -- Involuntary admission to inpatient psychiatric unit for safety, stabilization and treatment  -- Daily contact with patient to assess and evaluate symptoms and progress in treatment  -- Patient's case to be discussed in multi-disciplinary team meeting  -- Observation Level : q15 minute checks  -- Vital signs:  q12 hours  -- Precautions: suicide, elopement, and assault  2. Interventions (medications, psychoeducation, etc):  Bipolar 1 disorder, mixed, severe  --  Change olanzapine to 5 mg in the morning and 50 mg at night -- Continue trazodone 50 mg nightly for insomnia  Nicotine Dependence Cannabis Use -discontinued NRT as she has refused  Self Inflicted Laceration -consult to wound care for management of laceration -Bactrim ever 12 hours  Vitamin D Deficiency -vitamin  d supplementation 400 units daily  PRN medications for symptomatic management:              -- continue acetaminophen 650 mg every 6 hours as needed for mild to moderate pain, fever, and headaches              -- continue hydroxyzine 25 mg three times a day as needed for anxiety              -- continue bismuth subsalicylate 524 mg oral chewable tablet every 3 hours as needed for indigestion              -- continue senna 8.6 mg oral at bedtime as needed and polyethylene glycol 17 g oral daily as needed for mild to moderate constipation              -- continue ondansetron 8 mg every 8 hours as needed for nausea or vomiting              -- continue aluminum-magnesium hydroxide + simethicone 30 mL every 4 hours as needed for heartburn  -- As needed agitation protocol in-place  The risks/benefits/side-effects/alternatives to the above medication were discussed in detail with the patient and time was given for questions. The patient consents to medication trial. FDA black box warnings, if present, were discussed.  The patient is agreeable with the medication plan, as above. We will monitor the patient's response to pharmacologic treatment, and adjust medications as necessary.  3. Routine and other pertinent labs: BMI: Body mass index is 42.76 kg/m. Lipid Panel: Lab Results  Component Value Date   CHOL 136 10/31/2023   TRIG 75 10/31/2023   HDL 37 (L) 10/31/2023   CHOLHDL 3.7 10/31/2023   VLDL 15 10/31/2023   LDLCALC 84 10/31/2023    HbgA1c: Hgb A1c MFr Bld (%)  Date Value  10/31/2023 5.1    TSH: No results found for: "TSH"  EKG monitoring: QTc: 385  4. Group Therapy:  -- Encouraged patient to participate in unit milieu and in scheduled group therapies   -- Short Term Goals: Ability to identify changes in lifestyle to reduce recurrence of condition, verbalize feelings, identify and develop effective coping behaviors, maintain clinical measurements within normal limits, and  identify triggers associated with substance abuse/mental health issues will improve. Improvement in ability to demonstrate self-control and comply with prescribed medications.  -- Long Term Goals: Improvement in symptoms so as ready for discharge -- Patient is encouraged to participate in group therapy while admitted to the psychiatric unit. -- We will address other chronic and acute stressors, which contributed to the patient's Bipolar 1 disorder, mixed, severe (HCC) in order to reduce the risk of self-harm at discharge.  5. Discharge Planning:   -- Social work and case management to assist with discharge planning and identification of hospital follow-up needs prior to discharge  -- Estimated LOS: 7-14 days  -- Discharge Concerns: Need to establish a safety plan; Medication compliance and effectiveness  -- Discharge Goals: Return home with outpatient referrals for mental health follow-up including medication management/psychotherapy  Total Time Spent in Direct Patient Care:  I personally spent 35 minutes on the unit  in direct patient care. The direct patient care time included face-to-face time with the patient, reviewing the patient's chart, communicating with other professionals, and coordinating care. Greater than 50% of this time was spent in counseling or coordinating care with the patient regarding goals of hospitalization, psycho-education, and discharge planning needs.  Signed: Park Pope, MD 10/31/2023, 1:16 PM

## 2023-10-31 NOTE — Group Note (Signed)
 Date:  10/31/2023 Time:  6:42 PM  Group Topic/Focus:  Early Warning Signs:   The focus of this group is to help patients identify signs or symptoms they exhibit before slipping into an unhealthy state or crisis. Healthy Communication:   The focus of this group is to discuss communication, barriers to communication, as well as healthy ways to communicate with others. Managing Feelings:   The focus of this group is to identify what feelings patients have difficulty handling and develop a plan to handle them in a healthier way upon discharge. Primary and Secondary Emotions:   The focus of this group is to discuss the difference between primary and secondary emotions.    Participation Level:  Active  Participation Quality:  Attentive, Sharing, and Supportive  Affect:  Appropriate  Cognitive:  Alert, Appropriate, and Oriented  Insight: Improving  Engagement in Group:  Engaged, Improving, and Supportive  Modes of Intervention:  Discussion, Education, and Support  Additional Comments:  Pt engaged, participated well in discussion and was appropriate.   Sherryl Manges 10/31/2023, 6:42 PM

## 2023-10-31 NOTE — [facility] Group Notes (Signed)
 [facility] Group Notes:  (Nursing) Support Date:  10/31/2023  Time:  5:09 PM  Type of Therapy:  Nurse Education  Participation Level:  Minimal  Participation Quality:  Attentive, Sharing, and Supportive  Affect:  Anxious  Cognitive:  Alert and Oriented  Insight:  Improving  Engagement in Group:  Engaged and Supportive  Modes of Intervention:  Discussion, Education, and Support  Summary of Progress/Problems:  Pt was verbally engaged, contributed to discussion minimally but appropriate.    Sherryl Manges 10/31/2023, 5:09 PM

## 2023-10-31 NOTE — Plan of Care (Signed)
   Problem: Education: Goal: Emotional status will improve Outcome: Progressing Goal: Mental status will improve Outcome: Progressing   Problem: Activity: Goal: Interest or engagement in activities will improve Outcome: Progressing Goal: Sleeping patterns will improve Outcome: Progressing

## 2023-10-31 NOTE — Progress Notes (Signed)
   10/31/23 0914  Psych Admission Type (Psych Patients Only)  Admission Status Involuntary  Psychosocial Assessment  Patient Complaints Anxiety  Eye Contact Fair  Facial Expression Flat  Affect Anxious  Speech Logical/coherent  Interaction Assertive  Motor Activity Restless  Appearance/Hygiene Unremarkable  Behavior Characteristics Cooperative;Anxious  Mood Anxious  Thought Process  Coherency Circumstantial  Content Preoccupation  Delusions Paranoid  Perception Hallucinations  Hallucination Auditory  Judgment Poor  Confusion Mild  Danger to Self  Current suicidal ideation? Denies  Agreement Not to Harm Self Yes  Description of Agreement Verbal  Danger to Others  Danger to Others None reported or observed

## 2023-11-01 DIAGNOSIS — F3163 Bipolar disorder, current episode mixed, severe, without psychotic features: Secondary | ICD-10-CM | POA: Diagnosis not present

## 2023-11-01 MED ORDER — VITAMIN D (ERGOCALCIFEROL) 1.25 MG (50000 UNIT) PO CAPS
50000.0000 [IU] | ORAL_CAPSULE | ORAL | Status: DC
  Administered 2023-11-01: 50000 [IU] via ORAL
  Filled 2023-11-01 (×2): qty 1

## 2023-11-01 MED ORDER — WHITE PETROLATUM EX OINT
TOPICAL_OINTMENT | CUTANEOUS | Status: AC
  Filled 2023-11-01: qty 5

## 2023-11-01 MED ORDER — OLANZAPINE 10 MG PO TBDP
20.0000 mg | ORAL_TABLET | Freq: Every day | ORAL | Status: DC
Start: 2023-11-02 — End: 2023-11-02
  Administered 2023-11-02: 20 mg via ORAL
  Filled 2023-11-01 (×3): qty 2

## 2023-11-01 MED ORDER — OLANZAPINE 15 MG PO TBDP
15.0000 mg | ORAL_TABLET | Freq: Every day | ORAL | Status: AC
  Administered 2023-11-01: 15 mg via ORAL
  Filled 2023-11-01: qty 1

## 2023-11-01 NOTE — Progress Notes (Signed)
   11/01/23 0811  Psych Admission Type (Psych Patients Only)  Admission Status Involuntary  Psychosocial Assessment  Patient Complaints None  Eye Contact Fair  Facial Expression Anxious  Affect Anxious  Speech Logical/coherent  Interaction Assertive  Motor Activity Restless  Appearance/Hygiene Unremarkable  Behavior Characteristics Cooperative  Mood Anxious  Thought Process  Coherency Circumstantial  Content Preoccupation  Delusions None reported or observed  Perception WDL  Hallucination None reported or observed  Judgment Poor  Confusion None  Danger to Self  Current suicidal ideation? Denies  Agreement Not to Harm Self Yes  Description of Agreement verbal  Danger to Others  Danger to Others None reported or observed
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
## 2023-11-01 NOTE — Plan of Care (Signed)
   Problem: Education: Goal: Emotional status will improve Outcome: Progressing Goal: Mental status will improve Outcome: Progressing   Problem: Activity: Goal: Interest or engagement in activities will improve Outcome: Progressing Goal: Sleeping patterns will improve Outcome: Progressing

## 2023-11-01 NOTE — Progress Notes (Signed)
 Collateral contact - [name] (mom) [phone]   3:45 PM - CSW called but couldn't leave a voicemail because the voicemail box was full.   6:22 PM - CSW called again, but couldn't leave a voicemail because the voicemail box was full.    Sameeha Rockefeller, LCSWA 11/01/2023

## 2023-11-01 NOTE — Group Note (Unsigned)
 Date:  11/01/2023 Time:  9:17 PM  Group Topic/Focus:  Wrap-Up Group:   The focus of this group is to help patients review their daily goal of treatment and discuss progress on daily workbooks.     Participation Level:  {[facility] PARTICIPATION VHQIO:96295}  Participation Quality:  {[facility] PARTICIPATION QUALITY:22265}  Affect:  {[facility] AFFECT:22266}  Cognitive:  {[facility] COGNITIVE:22267}  Insight: {BHH Insight2:20797}  Engagement in Group:  {[facility] ENGAGEMENT IN MWUXL:24401}  Modes of Intervention:  {[facility] MODES OF INTERVENTION:22269}  Additional Comments:  ***  Scot Dock 11/01/2023, 9:17 PM
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
## 2023-11-01 NOTE — Group Note (Signed)
 LCSW Group Therapy Note   Group Date: 11/01/2023 Start Time: 0100 End Time: 0200   Participation:  patient was present and actively participated in the conversation.  Type of Therapy:  Group Therapy  Topic:  Money Matters: Creating Stability, Confidence and Peace of Mind  Objective:  To help participants understand the impact of financial stability on well-being through the lens of Maslow's Hierarchy of Needs and develop practical strategies for budgeting, saving, and debt repayment.  Goals: Increase awareness of spending habits and financial priorities, recognizing how money supports basic needs, security, and relationships. Develop simple budgeting and saving strategies to enhance stability and peace of mind.  Reduce financial stress by creating a realistic debt repayment plan, supporting long-term confidence and well-being.  Summary:  Participants explored how financial stability connects to basic needs, relationships, and self-esteem using Maslow's Hierarchy. They discussed budgeting, saving, and debt repayment strategies, identifying small, manageable changes. Through interactive discussion and self-reflection, they gained insight into their financial habits and created personal action steps for improvement.  Therapeutic Modalities Used: Elements of Cognitive Behavioral Therapy (CBT) - Addressing financial stress and thought patterns. Psychoeducation - Engineer, agricultural. Elements of Motivational Interviewing (MI) - Encouraging realistic, achievable changes. Group Support - Reducing shame and stress through shared experiences.   Alla Feeling, LCSWA 11/01/2023  6:02 PM

## 2023-11-01 NOTE — Group Note (Signed)
 Recreation Therapy Group Note   Group Topic:Coping Skills  Group Date: 11/01/2023 Start Time: 1030 End Time: 1050 Facilitators: Lauria Depoy-McCall, LRT,CTRS Location: [address] Dayroom   Group Topic: Coping Skills   Goal Area(s) Addresses: Patient will define what a coping skill is. Patient will to create a list of healthy coping skills beginning with each letter of the alphabet. Patient will successfully identify positive coping skills they can use post d/c.  Patient will acknowledge benefit(s) of using learned coping skills post d/c.  Intervention: Group work   Activity: Coping A to Z. Patient asked to identify what a coping skill is and when they use them. Patients with Clinical research associate discussed healthy versus unhealthy coping skills. Next patients were given a blank worksheet titled "Coping Skills A-Z". Partners were instructed to come up with at least one positive coping skill per letter of the alphabet. Patients were given 15 minutes to brainstorm before ideas were presented to the large group. Patients and LRT debriefed on the importance of coping skill selection based on situation and back-up plans when a skill tried is not effective. At the end of group, patients were given an handout of alphabetized strategies to keep for future reference.   Education: Pharmacologist, Scientist, physiological, Discharge Planning.    Education Outcome: Acknowledges education/Verbalizes understanding/In group clarification offered/Additional education needed   Affect/Mood: N/A   Participation Level: Did not attend    Clinical Observations/Individualized Feedback:      Plan: Continue to engage patient in RT group sessions 2-3x/week.   Reesa Gotschall-McCall, LRT,CTRS 11/01/2023 1:30 PM

## 2023-11-02 DIAGNOSIS — F3163 Bipolar disorder, current episode mixed, severe, without psychotic features: Secondary | ICD-10-CM | POA: Diagnosis not present

## 2023-11-02 MED ORDER — OLANZAPINE 10 MG PO TBDP
10.0000 mg | ORAL_TABLET | Freq: Two times a day (BID) | ORAL | Status: DC
  Administered 2023-11-02 – 2023-11-04 (×4): 10 mg via ORAL
  Filled 2023-11-02 (×8): qty 1

## 2023-11-02 NOTE — Progress Notes (Signed)
   11/02/23 1930  Psych Admission Type (Psych Patients Only)  Admission Status Involuntary  Psychosocial Assessment  Patient Complaints None  Eye Contact Fair  Facial Expression Anxious  Affect Anxious  Speech Logical/coherent  Interaction Assertive  Motor Activity Restless  Appearance/Hygiene Unremarkable  Behavior Characteristics Cooperative;Anxious  Mood Pleasant  Thought Process  Coherency Circumstantial  Content WDL  Delusions None reported or observed  Perception WDL  Hallucination None reported or observed  Judgment Impaired  Confusion None  Danger to Self  Current suicidal ideation? Denies  Agreement Not to Harm Self Yes  Description of Agreement verbal  Danger to Others  Danger to Others None reported or observed   Progress note   D: Pt seen in the hallway. Pt denies SI, HI, AVH. Pt rates pain  3/10 in her left wrist where she has sutures from self-inflicted injury. Pt rates anxiety  0/10 and depression  0/10. Pt appears anxious but more social and able to carry a conversation. No other concerns noted at this time.  A: Pt provided support and encouragement. Pt given scheduled medication as prescribed. PRNs as appropriate. Q15 min checks for safety.   R: Pt safe on the unit. Will continue to monitor.

## 2023-11-02 NOTE — Progress Notes (Cosign Needed Addendum)
 [facility] MD Progress Note  11/02/2023 2:47 PM Lauren Poole  MRN:  [id number]  Principal Problem: Bipolar 1 disorder, mixed, severe (HCC) Diagnosis: Principal Problem:   Bipolar 1 disorder, mixed, severe (HCC) Active Problems:   Abnormal urinalysis   Reason for Admission:  Lauren Poole, a 33 year-old African American who initially presented to the [facility] ED on 10/28/2023 following an active suicide attempt. She reportedly used a kitchen knife to cut her left wrist, resulting in a gaping wound with tendon involvement, though bleeding was controlled upon arrival. At the time, she expressed a desire to die and was actively suicidal, with mildly pressured speech noted. She has a prior psychiatric history significant of MDD, mood disorder and alcohol abuse. She was admitted to [facility] on 10/28/2023 for further stabilization and treatment. (admitted on 10/28/2023, total  LOS: 5 days )  Chart Review from last 24 hours:  Chart reviewed, case discussed with treatment team.  Vital signs within normal limits.  Patient is medication compliant.  No PRNs administered over the past 24 hours.  Patient slept for 10 hours as per nursing reports and documentation.  No behavioral concerns noted in the past 24 hours.  Information Obtained Today During Patient Interview: On assessment today, the pt reports that their mood is euthymic, improved since admission, and stable. Denies feeling down, depressed, or sad.  Reports that anxiety symptoms are at manageable level.  Sleep is stable. Appetite is stable.  Concentration is without complaint.  Energy level is adequate. Denies having any suicidal thoughts. Denies having any suicidal intent and plan.  Denies having any HI.  Denies having psychotic symptoms.   Denies having side effects to current psychiatric medications.  Discussed discharge planning for tomorrow, 03/18, to which patient reports that she is ready, states that the  Zyprexa has been very helpful for her, reports that "it helps me mellow out." She reports that she resides in an apartment by herself, provided consent for writer to call her mother to ascertain if she is back to her baseline of functioning. Writer attempted to call mother three times at number listed on chart: Lauren Poole (Mother) [phone] [facility]). There was a message stating "your call has been forwarded to an automated voicemail system. The person you are trying to reach is not available, at the tone, please record your message." Message not left. We will try to reach mother again tomorrow, and will ask CSW to make attempts again at reaching mother. We will attempt to reach mother prior to discharge.  Past Psychiatric History: Per chart review patient was seen by Dr. Lolly Mustache on 09/28/2018 for depression and mood swings.    Past Medical History:  Past Medical History:  Diagnosis Date   Chlamydia    Gonorrhea    Family Psychiatric History: sister- schizophrenia  Current Medications: Current Facility-Administered Medications  Medication Dose Route Frequency Provider Last Rate Last Admin   acetaminophen (TYLENOL) tablet 650 mg  650 mg Oral Q6H PRN Motley-Mangrum, Jadeka A, PMHNP   650 mg at 11/02/23 0817   alum & mag hydroxide-simeth (MAALOX/MYLANTA) 200-200-20 MG/5ML suspension 30 mL  30 mL Oral Q4H PRN Motley-Mangrum, Jadeka A, PMHNP       haloperidol (HALDOL) tablet 5 mg  5 mg Oral TID PRN Motley-Mangrum, Jadeka A, PMHNP   5 mg at 10/29/23 2239   And   diphenhydrAMINE (BENADRYL) capsule 50 mg  50 mg Oral TID PRN Motley-Mangrum, Jadeka A, PMHNP   50 mg at 10/29/23 2239  haloperidol lactate (HALDOL) injection 5 mg  5 mg Intramuscular TID PRN Motley-Mangrum, Jadeka A, PMHNP       And   diphenhydrAMINE (BENADRYL) injection 50 mg  50 mg Intramuscular TID PRN Motley-Mangrum, Jadeka A, PMHNP       And   LORazepam (ATIVAN) injection 2 mg  2 mg Intramuscular TID PRN Motley-Mangrum, Jadeka A,  PMHNP       haloperidol lactate (HALDOL) injection 10 mg  10 mg Intramuscular TID PRN Motley-Mangrum, Jadeka A, PMHNP       And   diphenhydrAMINE (BENADRYL) injection 50 mg  50 mg Intramuscular TID PRN Motley-Mangrum, Jadeka A, PMHNP       And   LORazepam (ATIVAN) injection 2 mg  2 mg Intramuscular TID PRN Motley-Mangrum, Geralynn Ochs A, PMHNP       hydrOXYzine (ATARAX) tablet 25 mg  25 mg Oral TID PRN Motley-Mangrum, Jadeka A, PMHNP   25 mg at 10/29/23 0653   magnesium hydroxide (MILK OF MAGNESIA) suspension 30 mL  30 mL Oral Daily PRN Motley-Mangrum, Jadeka A, PMHNP       nicotine polacrilex (NICORETTE) gum 2 mg  2 mg Oral PRN Massengill, Harrold Donath, MD   2 mg at 10/31/23 1455   OLANZapine zydis (ZYPREXA) disintegrating tablet 10 mg  10 mg Oral BID Starleen Blue, NP       sulfamethoxazole-trimethoprim (BACTRIM DS) 800-160 MG per tablet 1 tablet  1 tablet Oral Q12H Bennett, Christal H, NP   1 tablet at 11/02/23 0818   Tdap (BOOSTRIX) injection 0.5 mL  0.5 mL Intramuscular Once Motley-Mangrum, Jadeka A, PMHNP       traZODone (DESYREL) tablet 50 mg  50 mg Oral QHS Park Pope, MD   50 mg at 11/01/23 2101   Vitamin D (Ergocalciferol) (DRISDOL) 1.25 MG (50000 UNIT) capsule 50,000 Units  50,000 Units Oral Q7 days Starleen Blue, NP   50,000 Units at 11/01/23 1627    Lab Results:  No results found for this or any previous visit (from the past 48 hours).   Blood Alcohol level:  No results found for: "ETH"  Metabolic Labs: Lab Results  Component Value Date   HGBA1C 5.1 10/31/2023   MPG 99.67 10/31/2023   No results found for: "PROLACTIN" Lab Results  Component Value Date   CHOL 136 10/31/2023   TRIG 75 10/31/2023   HDL 37 (L) 10/31/2023   CHOLHDL 3.7 10/31/2023   VLDL 15 10/31/2023   LDLCALC 84 10/31/2023    Physical Findings: AIMS: No  CIWA:    COWS:     Psychiatric Specialty Exam: General Appearance: Appropriate for Environment   Eye Contact: Fair   Speech: Clear and Coherent    Volume: Normal   Mood: Depressed   Affect: Congruent   Thought Content: Logical   Suicidal Thoughts: Suicidal Thoughts: No    Homicidal Thoughts: Homicidal Thoughts: No    Thought Process: Coherent   Orientation: Full (Time, Place and Person)     Memory: Immediate Fair   Judgment: Fair   Insight: Fair   Concentration: Fair   Recall: Fair   Fund of Knowledge: Fair   Language: Fair   Psychomotor Activity: Psychomotor Activity: Normal    Assets: Resilience   Sleep: Sleep: Good     Review of Systems Review of Systems  Psychiatric/Behavioral:  Positive for depression and substance abuse. Negative for hallucinations, memory loss and suicidal ideas. The patient is nervous/anxious and has insomnia.   All other systems reviewed and are negative.  Vital Signs: Blood pressure (!) 127/97, pulse 62, temperature 98.3 F (36.8 C), temperature source Oral, resp. rate 20, height 5\' 4"  (1.626 m), weight 113 kg, SpO2 100%. Body mass index is 42.76 kg/m. Physical Exam Constitutional:      Appearance: Normal appearance.  Eyes:     Pupils: Pupils are equal, round, and reactive to light.  Musculoskeletal:     Cervical back: Normal range of motion.  Neurological:     General: No focal deficit present.     Mental Status: She is alert and oriented to person, place, and time.     Assets  Assets: Resilience   Treatment Plan Summary: Daily contact with patient to assess and evaluate symptoms and progress in treatment and Medication management  Diagnoses / Active Problems: Bipolar 1 disorder, mixed, severe (HCC) Principal Problem:   Bipolar 1 disorder, mixed, severe (HCC) Active Problems:   Abnormal urinalysis  PLAN: Safety and Monitoring:  -- Involuntary admission to inpatient psychiatric unit for safety, stabilization and treatment  -- Daily contact with patient to assess and evaluate symptoms and progress in treatment  -- Patient's case to be discussed in  multi-disciplinary team meeting  -- Observation Level : q15 minute checks  -- Vital signs:  q12 hours  -- Precautions: suicide, elopement, and assault  2. Interventions (medications, psychoeducation, etc):  Bipolar 1 disorder, mixed, severe  -- Change olanzapine to 10 mg BID per pt's request starting today -- Continue trazodone 50 mg nightly for insomnia  Nicotine Dependence Cannabis Use -Previously discontinued NRT as she has refused  Self Inflicted Laceration -consult to wound care for management of laceration -Bactrim ever 12 hours  Vitamin D Deficiency -Continue Vitamin D 50.000 units weekly for low D levels.  PRN medications for symptomatic management:              -- continue acetaminophen 650 mg every 6 hours as needed for mild to moderate pain, fever, and headaches              -- continue hydroxyzine 25 mg three times a day as needed for anxiety              -- continue bismuth subsalicylate 524 mg oral chewable tablet every 3 hours as needed for indigestion              -- continue senna 8.6 mg oral at bedtime as needed and polyethylene glycol 17 g oral daily as needed for mild to moderate constipation              -- continue ondansetron 8 mg every 8 hours as needed for nausea or vomiting              -- continue aluminum-magnesium hydroxide + simethicone 30 mL every 4 hours as needed for heartburn  -- As needed agitation protocol in-place  The risks/benefits/side-effects/alternatives to the above medication were discussed in detail with the patient and time was given for questions. The patient consents to medication trial. FDA black box warnings, if present, were discussed.  The patient is agreeable with the medication plan, as above. We will monitor the patient's response to pharmacologic treatment, and adjust medications as necessary.  3. Routine and other pertinent labs: BMI: Body mass index is 42.76 kg/m. Lipid Panel: Lab Results  Component Value Date   CHOL  136 10/31/2023   TRIG 75 10/31/2023   HDL 37 (L) 10/31/2023   CHOLHDL 3.7 10/31/2023   VLDL 15 10/31/2023   LDLCALC  84 10/31/2023    HbgA1c: Hgb A1c MFr Bld (%)  Date Value  10/31/2023 5.1   TSH: No results found for: "TSH"  EKG monitoring: QTc: 385  4. Group Therapy:  -- Encouraged patient to participate in unit milieu and in scheduled group therapies   -- Short Term Goals: Ability to identify changes in lifestyle to reduce recurrence of condition, verbalize feelings, identify and develop effective coping behaviors, maintain clinical measurements within normal limits, and identify triggers associated with substance abuse/mental health issues will improve. Improvement in ability to demonstrate self-control and comply with prescribed medications.  -- Long Term Goals: Improvement in symptoms so as ready for discharge -- Patient is encouraged to participate in group therapy while admitted to the psychiatric unit. -- We will address other chronic and acute stressors, which contributed to the patient's Bipolar 1 disorder, mixed, severe (HCC) in order to reduce the risk of self-harm at discharge.  5. Discharge Planning:   -- Social work and case management to assist with discharge planning and identification of hospital follow-up needs prior to discharge  -- Estimated LOS: 7-14 days  -- Discharge Concerns: Need to establish a safety plan; Medication compliance and effectiveness  -- Discharge Goals: Return home with outpatient referrals for mental health follow-up including medication management/psychotherapy  Total Time Spent in Direct Patient Care:  I personally spent 45 minutes on the unit in direct patient care. The direct patient care time included face-to-face time with the patient, reviewing the patient's chart, communicating with other professionals, and coordinating care. Greater than 50% of this time was spent in counseling or coordinating care with the patient regarding goals of  hospitalization, psycho-education, and discharge planning needs.  Signed: Starleen Blue, NP 11/02/2023, 2:47 PMPatient ID: Lauren Poole, female   DOB: [birth year], 33 y.o.   MRN: [id number] Patient ID: Lauren Poole, female   DOB: [birth year], 33 y.o.   MRN: [id number]

## 2023-11-02 NOTE — [facility] Group Notes (Signed)
 Adult Psychoeducational Group Note  Date:  11/02/2023 Time:  8:53 PM  Group Topic/Focus:  Wrap-Up Group:   The focus of this group is to help patients review their daily goal of treatment and discuss progress on daily workbooks.  Participation Level:  Active  Participation Quality:  Appropriate  Affect:  Appropriate  Cognitive:  Appropriate  Insight: Appropriate  Engagement in Group:  Engaged  Modes of Intervention:  Discussion  Additional Comments:   Pt states that she had a good visit today and has been socializing more today. Pt spoke with her doctor and social worker and feels like they are assisting her well. Pt denied everything  Lauren Poole 11/02/2023, 8:53 PM

## 2023-11-02 NOTE — Progress Notes (Signed)
   11/02/23 0000  Psych Admission Type (Psych Patients Only)  Admission Status Involuntary  Psychosocial Assessment  Patient Complaints None  Eye Contact Fair  Facial Expression Anxious  Affect Appropriate to circumstance  Speech Logical/coherent  Interaction Assertive  Motor Activity Restless  Appearance/Hygiene Unremarkable  Behavior Characteristics Cooperative;Appropriate to situation  Mood Pleasant  Thought Process  Coherency Circumstantial  Content WDL  Delusions None reported or observed  Perception WDL  Hallucination None reported or observed  Judgment Poor  Confusion None  Danger to Self  Current suicidal ideation? Denies  Agreement Not to Harm Self Yes  Description of Agreement verbal  Danger to Others  Danger to Others None reported or observed

## 2023-11-02 NOTE — Group Note (Signed)
 Recreation Therapy Group Note   Group Topic:Animal Assisted Therapy   Group Date: 11/02/2023 Start Time: 0945 End Time: 1030 Facilitators: Marcheta Horsey-McCall, LRT,CTRS Location: [address] Hall Dayroom   Animal-Assisted Activity (AAA) Program Checklist/Progress Notes Patient Eligibility Criteria Checklist & Daily Group note for Rec Tx Intervention  AAA/T Program Assumption of Risk Form signed by Patient/ or Parent Legal Guardian Yes  Patient is free of allergies or severe asthma Yes  Patient reports no fear of animals Yes  Patient reports no history of cruelty to animals Yes  Patient understands his/her participation is voluntary Yes  Patient washes hands before animal contact Yes  Patient washes hands after animal contact Yes  Education: Hand Washing, Appropriate Animal Interaction   Education Outcome: Acknowledges education.    Affect/Mood: Appropriate   Participation Level: Engaged   Participation Quality: Independent   Behavior: Appropriate   Speech/Thought Process: Focused   Insight: Good   Judgement: Good   Modes of Intervention: Teaching laboratory technician   Patient Response to Interventions:  Engaged   Education Outcome:  In group clarification offered    Clinical Observations/Individualized Feedback: Patient attended session and interacted appropriately with therapy dog and peers. Patient asked appropriate questions about therapy dog and his training. Patient shared stories about their pets at home with group.      Plan: Continue to engage patient in RT group sessions 2-3x/week.   Lauren Poole, LRT,CTRS 11/02/2023 2:18 PM

## 2023-11-02 NOTE — Progress Notes (Signed)
   11/02/23 1100  Psych Admission Type (Psych Patients Only)  Admission Status Involuntary  Psychosocial Assessment  Patient Complaints None  Eye Contact Fair  Facial Expression Anxious  Affect Appropriate to circumstance  Speech Logical/coherent  Interaction Assertive  Motor Activity Restless  Appearance/Hygiene Unremarkable  Behavior Characteristics Cooperative  Mood Pleasant  Thought Process  Coherency Circumstantial  Content WDL  Delusions None reported or observed  Perception WDL  Hallucination None reported or observed  Judgment Poor  Confusion None  Danger to Self  Current suicidal ideation? Denies  Danger to Others  Danger to Others None reported or observed   Dar Note: Patient presents with a calm affect and depressed mood.  Denies suicidal thoughts, auditory and visual hallucinations.  Medications given as prescribed.  Routine safety checks maintained.  Patient attended and participated.  Requested and received Tylenol 650 mg for complain of headache with good effect.  Patient is safe on and off the unit.

## 2023-11-02 NOTE — Plan of Care (Signed)
   Problem: Education: Goal: Emotional status will improve Outcome: Progressing Goal: Mental status will improve Outcome: Progressing

## 2023-11-02 NOTE — [facility] Suicide Risk Assessment (Addendum)
 [facility] INPATIENT:  Family/Significant Other Suicide Prevention Education  Suicide Prevention Education:  Education Completed;  [name] (mom) [phone],  (name of family member/significant other) has been identified by the patient as the family member/significant other with whom the patient will be residing, and identified as the person(s) who will aid the patient in the event of a mental health crisis (suicidal ideations/suicide attempt).  With written consent from the patient, the family member/significant other has been provided the following suicide prevention education, prior to the and/or following the discharge of the patient.  Mom said that patient doesn't have any guns.  She said that the property manager informed her she can't enter patient's apartment without patient's presence (patient told CSW that she thought her mom was on the list).  Mom stated that she is available to pick up patient at discharge, and accompany her to the apartment to secure medications and sharp objects (such as knives, scissors, etc.) after they leave the hospital.  When asked if she has any concerns about patient returning home, she said that she wants to be included in discussions regarding medications and their administration schedule.  Mom also said that her job will allow her to take short-term disability. She offered that she could stay with patient in her apartment for a couple of days, or patient could stay with her or her boyfriend, depending on what patient prefers.  Mom emphasized that she wants to support patient.  When asked when she last spoke with  patient, mom said they had spoken on Saturday or Sunday, and patient "didn't sound like herself."  Yesterday, she spoke briefly with patient (for less than a minute).  Patient was quiet, sounded tired, asked who it was, and hung up when mom identified herself.  "She didn't answer when I tried to call her today."  Mom said that patient's boyfriend will visit her  today.  Mom said that patient lives in [place], boyfriend also lives in [place] (they don't live together), and patient's mom lives in [place].  The suicide prevention education provided includes the following: Suicide risk factors Suicide prevention and interventions National Suicide Hotline telephone number [facility] assessment telephone number [facility] Emergency Assistance 911 [facility] and/or Residential Mobile Crisis Unit telephone number  Request made of family/significant other to: Remove weapons (e.g., guns, rifles, knives), all items previously/currently identified as safety concern.   Remove drugs/medications (over-the-counter, prescriptions, illicit drugs), all items previously/currently identified as a safety concern.  The family member/significant other verbalizes understanding of the suicide prevention education information provided.  The family member/significant other agrees to remove the items of safety concern listed above.  Lauren Poole O Lauren Poole, LCSWA 11/02/2023, 7:04 PM

## 2023-11-02 NOTE — [facility] Group Notes (Signed)
 Adult Psychoeducational Group Note  Date:  11/02/2023 Time:  4:49 PM  Group Topic/Focus:  Goals Group:   The focus of this group is to help patients establish daily goals to achieve during treatment and discuss how the patient can incorporate goal setting into their daily lives to aide in recovery. Orientation:   The focus of this group is to educate the patient on the purpose and policies of crisis stabilization and provide a format to answer questions about their admission.  The group details unit policies and expectations of patients while admitted.  Participation Level:  Did Not Attend  Participation Quality:    Affect:    Cognitive:    Insight:   Engagement in Group:    Modes of Intervention:    Additional Comments:    Sheran Lawless 11/02/2023, 4:49 PM

## 2023-11-02 NOTE — Plan of Care (Signed)

## 2023-11-03 ENCOUNTER — Encounter (HOSPITAL_COMMUNITY): Payer: Self-pay

## 2023-11-03 DIAGNOSIS — F3163 Bipolar disorder, current episode mixed, severe, without psychotic features: Secondary | ICD-10-CM | POA: Diagnosis not present

## 2023-11-03 NOTE — Plan of Care (Signed)
  Problem: Education: Goal: Emotional status will improve Outcome: Progressing Goal: Mental status will improve Outcome: Progressing Goal: Verbalization of understanding the information provided will improve Outcome: Progressing   Problem: Activity: Goal: Interest or engagement in activities will improve Outcome: Progressing Goal: Sleeping patterns will improve Outcome: Progressing   Problem: Coping: Goal: Ability to demonstrate self-control will improve Outcome: Progressing   Problem: Health Behavior/Discharge Planning: Goal: Compliance with treatment plan for underlying cause of condition will improve Outcome: Progressing

## 2023-11-03 NOTE — Group Note (Signed)
 Recreation Therapy Group Note   Group Topic:Stress Management  Group Date: 11/03/2023 Start Time: 1009 End Time: 1029 Facilitators: Charlton Boule-McCall, LRT,CTRS Location: [address] Dayroom   Group Topic: Stress Management  Goal Area(s) Addresses:  Patient will identify positive stress management techniques. Patient will identify benefits of using stress management post d/c.  Intervention: Calm App  Activity:  Meditation. LRT and patients went through a series of stretches to loosen and relax the muscles. LRT then played a meditation that focused on being resilient in the face of adversity. Patients were encouraged to sit back, relax and focus on their breathing to give full attention to the meditation as it played.   Education:  Stress Management, Discharge Planning.   Education Outcome: Acknowledges Education   Affect/Mood: Flat   Participation Level: Minimal   Participation Quality: Independent   Behavior: On-looking   Speech/Thought Process: Unfocused   Insight: None   Judgement: None   Modes of Intervention: App   Patient Response to Interventions:  Disengaged   Education Outcome:  In group clarification offered    Clinical Observations/Individualized Feedback: Pt came into group halfway through the meditation. Pt was disengaged and unable to focus partially due to the countless distractions during group.     Plan: Continue to engage patient in RT group sessions 2-3x/week.   Shelda Truby-McCall, LRT,CTRS 11/03/2023 12:15 PM

## 2023-11-03 NOTE — Progress Notes (Signed)
 [facility] MD Progress Note  11/03/2023 11:25 AM Lauren Poole  MRN:  [id number]  Principal Problem: Bipolar 1 disorder, mixed, severe (HCC) Diagnosis: Principal Problem:   Bipolar 1 disorder, mixed, severe (HCC) Active Problems:   Abnormal urinalysis   Reason for Admission:  Lauren Poole, a 33 year-old African American who initially presented to the [facility] ED on 10/28/2023 following an active suicide attempt. She reportedly used a kitchen knife to cut her left wrist, resulting in a gaping wound with tendon involvement, though bleeding was controlled upon arrival. At the time, she expressed a desire to die and was actively suicidal, with mildly pressured speech noted. She has a prior psychiatric history significant of MDD, mood disorder and alcohol abuse. She was admitted to [facility] on 10/28/2023 for further stabilization and treatment. (admitted on 10/28/2023, total  LOS: 6 days )  Chart Review from last 24 hours:  Chart reviewed, case discussed with treatment team.  Vital signs within normal limits.  Patient is medication compliant.  Last as needed medication for agitation was given on 3/14.  Information Obtained Today During Patient Interview: On assessment today, patient presents linear and organized, and pleasant reporting better sleep since admission denying any passive or active SI intention or plan, she denies AVH and reports auditory hallucinations prior to admission probably related to "I had a psychotic break I was not sleeping" she denies side effect to current medication regimen and agrees to comply after discharge.  She is attending groups.  She reports her friend visited yesterday.  She does report extended family history of mental illness including schizophrenia in maternal uncle and sister as well as her mother had multiple admissions to psychiatric hospitals but she is unsure of her diagnosis.  She denies side effect to medications and does not  appear sleepy, does not display any sign consistent with EPS or TD.  Discussed the plan to discharge tomorrow if continues to improve, mother in agreement with the plan for discharge and aftercare plan.  Past Psychiatric History: Per chart review patient was seen by Dr. Lolly Mustache on 09/28/2018 for depression and mood swings.    Past Medical History:  Past Medical History:  Diagnosis Date   Chlamydia    Gonorrhea    Family Psychiatric History: sister- schizophrenia  Current Medications: Current Facility-Administered Medications  Medication Dose Route Frequency Provider Last Rate Last Admin   acetaminophen (TYLENOL) tablet 650 mg  650 mg Oral Q6H PRN Motley-Mangrum, Jadeka A, PMHNP   650 mg at 11/02/23 2036   alum & mag hydroxide-simeth (MAALOX/MYLANTA) 200-200-20 MG/5ML suspension 30 mL  30 mL Oral Q4H PRN Motley-Mangrum, Jadeka A, PMHNP       haloperidol (HALDOL) tablet 5 mg  5 mg Oral TID PRN Motley-Mangrum, Jadeka A, PMHNP   5 mg at 10/29/23 2239   And   diphenhydrAMINE (BENADRYL) capsule 50 mg  50 mg Oral TID PRN Motley-Mangrum, Jadeka A, PMHNP   50 mg at 10/29/23 2239   haloperidol lactate (HALDOL) injection 5 mg  5 mg Intramuscular TID PRN Motley-Mangrum, Jadeka A, PMHNP       And   diphenhydrAMINE (BENADRYL) injection 50 mg  50 mg Intramuscular TID PRN Motley-Mangrum, Jadeka A, PMHNP       And   LORazepam (ATIVAN) injection 2 mg  2 mg Intramuscular TID PRN Motley-Mangrum, Jadeka A, PMHNP       haloperidol lactate (HALDOL) injection 10 mg  10 mg Intramuscular TID PRN Motley-Mangrum, Ezra Sites, PMHNP  And   diphenhydrAMINE (BENADRYL) injection 50 mg  50 mg Intramuscular TID PRN Motley-Mangrum, Jadeka A, PMHNP       And   LORazepam (ATIVAN) injection 2 mg  2 mg Intramuscular TID PRN Motley-Mangrum, Geralynn Ochs A, PMHNP       hydrOXYzine (ATARAX) tablet 25 mg  25 mg Oral TID PRN Motley-Mangrum, Jadeka A, PMHNP   25 mg at 10/29/23 0653   magnesium hydroxide (MILK OF MAGNESIA) suspension  30 mL  30 mL Oral Daily PRN Motley-Mangrum, Jadeka A, PMHNP       nicotine polacrilex (NICORETTE) gum 2 mg  2 mg Oral PRN Massengill, Harrold Donath, MD   2 mg at 10/31/23 1455   OLANZapine zydis (ZYPREXA) disintegrating tablet 10 mg  10 mg Oral BID Starleen Blue, NP   10 mg at 11/03/23 0826   Tdap (BOOSTRIX) injection 0.5 mL  0.5 mL Intramuscular Once Motley-Mangrum, Jadeka A, PMHNP       traZODone (DESYREL) tablet 50 mg  50 mg Oral QHS Park Pope, MD   50 mg at 11/02/23 2037   Vitamin D (Ergocalciferol) (DRISDOL) 1.25 MG (50000 UNIT) capsule 50,000 Units  50,000 Units Oral Q7 days Starleen Blue, NP   50,000 Units at 11/01/23 1627    Lab Results:  No results found for this or any previous visit (from the past 48 hours).   Blood Alcohol level:  No results found for: "ETH"  Metabolic Labs: Lab Results  Component Value Date   HGBA1C 5.1 10/31/2023   MPG 99.67 10/31/2023   No results found for: "PROLACTIN" Lab Results  Component Value Date   CHOL 136 10/31/2023   TRIG 75 10/31/2023   HDL 37 (L) 10/31/2023   CHOLHDL 3.7 10/31/2023   VLDL 15 10/31/2023   LDLCALC 84 10/31/2023    Physical Findings: AIMS: No  CIWA:    COWS:     Psychiatric Specialty Exam: General Appearance: Appropriate for Environment   Eye Contact: Fair   Speech: Clear and Coherent   Volume: Normal   Mood: Depressed   Affect: Congruent   Thought Content: Logical   Suicidal Thoughts: Suicidal Thoughts: No    Homicidal Thoughts: Homicidal Thoughts: No    Thought Process: Coherent   Orientation: Full (Time, Place and Person)     Memory: Immediate Fair   Judgment: Fair   Insight: Fair   Concentration: Fair   Recall: Fair   Fund of Knowledge: Fair   Language: Fair   Psychomotor Activity: Psychomotor Activity: Normal    Assets: Resilience   Sleep: Sleep: Good     Review of Systems Review of Systems  Psychiatric/Behavioral:  Negative for depression, hallucinations, memory loss,  substance abuse and suicidal ideas. The patient is not nervous/anxious and does not have insomnia.   All other systems reviewed and are negative.   Vital Signs: Blood pressure 126/82, pulse 64, temperature 98.4 F (36.9 C), temperature source Oral, resp. rate 20, height 5\' 4"  (1.626 m), weight 113 kg, SpO2 98%. Body mass index is 42.76 kg/m. Physical Exam Vitals and nursing note reviewed.  Constitutional:      Appearance: Normal appearance.  Eyes:     Pupils: Pupils are equal, round, and reactive to light.  Musculoskeletal:     Cervical back: Normal range of motion.  Neurological:     General: No focal deficit present.     Mental Status: She is alert and oriented to person, place, and time.     Assets  Assets: Resilience  Treatment Plan Summary: Daily contact with patient to assess and evaluate symptoms and progress in treatment and Medication management  Diagnoses / Active Problems: Bipolar 1 disorder, mixed, severe (HCC) Principal Problem:   Bipolar 1 disorder, mixed, severe (HCC) Active Problems:   Abnormal urinalysis  PLAN: Safety and Monitoring:  -- Involuntary admission to inpatient psychiatric unit for safety, stabilization and treatment  -- Daily contact with patient to assess and evaluate symptoms and progress in treatment  -- Patient's case to be discussed in multi-disciplinary team meeting  -- Observation Level : q15 minute checks  -- Vital signs:  q12 hours  -- Precautions: suicide, elopement, and assault  2. Interventions (medications, psychoeducation, etc):  Bipolar 1 disorder, mixed, severe  -- Change olanzapine to 10 mg BID per pt's request starting today -- Continue trazodone 50 mg nightly for insomnia  Nicotine Dependence Cannabis Use -Previously discontinued NRT as she has refused  Self Inflicted Laceration -consult to wound care for management of laceration -Bactrim ever 12 hours  Vitamin D Deficiency -Continue Vitamin D 50.000 units  weekly for low D levels.  PRN medications for symptomatic management:              -- continue acetaminophen 650 mg every 6 hours as needed for mild to moderate pain, fever, and headaches              -- continue hydroxyzine 25 mg three times a day as needed for anxiety              -- continue bismuth subsalicylate 524 mg oral chewable tablet every 3 hours as needed for indigestion              -- continue senna 8.6 mg oral at bedtime as needed and polyethylene glycol 17 g oral daily as needed for mild to moderate constipation              -- continue ondansetron 8 mg every 8 hours as needed for nausea or vomiting              -- continue aluminum-magnesium hydroxide + simethicone 30 mL every 4 hours as needed for heartburn  -- As needed agitation protocol in-place  The risks/benefits/side-effects/alternatives to the above medication were discussed in detail with the patient and time was given for questions. The patient consents to medication trial. FDA black box warnings, if present, were discussed.  The patient is agreeable with the medication plan, as above. We will monitor the patient's response to pharmacologic treatment, and adjust medications as necessary.  3. Routine and other pertinent labs: BMI: Body mass index is 42.76 kg/m. Lipid Panel: Lab Results  Component Value Date   CHOL 136 10/31/2023   TRIG 75 10/31/2023   HDL 37 (L) 10/31/2023   CHOLHDL 3.7 10/31/2023   VLDL 15 10/31/2023   LDLCALC 84 10/31/2023    HbgA1c: Hgb A1c MFr Bld (%)  Date Value  10/31/2023 5.1   TSH: No results found for: "TSH"  EKG monitoring: QTc: 385  4. Group Therapy:  -- Encouraged patient to participate in unit milieu and in scheduled group therapies   -- Short Term Goals: Ability to identify changes in lifestyle to reduce recurrence of condition, verbalize feelings, identify and develop effective coping behaviors, maintain clinical measurements within normal limits, and identify triggers  associated with substance abuse/mental health issues will improve. Improvement in ability to demonstrate self-control and comply with prescribed medications.  -- Long Term Goals: Improvement in symptoms so  as ready for discharge -- Patient is encouraged to participate in group therapy while admitted to the psychiatric unit. -- We will address other chronic and acute stressors, which contributed to the patient's Bipolar 1 disorder, mixed, severe (HCC) in order to reduce the risk of self-harm at discharge.  5. Discharge Planning:   -- Social work and case management to assist with discharge planning and identification of hospital follow-up needs prior to discharge  -- Estimated LOS: 7-14 days  -- Discharge Concerns: Need to establish a safety plan; Medication compliance and effectiveness  -- Discharge Goals: Return home with outpatient referrals for mental health follow-up including medication management/psychotherapy  Total Time Spent in Direct Patient Care:  I personally spent 35 minutes on the unit in direct patient care. The direct patient care time included face-to-face time with the patient, reviewing the patient's chart, communicating with other professionals, and coordinating care. Greater than 50% of this time was spent in counseling or coordinating care with the patient regarding goals of hospitalization, psycho-education, and discharge planning needs.  SignedSarita Bottom, MD 11/03/2023, 11:25 AMPatient ID: Webb Laws, female   DOB: 06-Dec-1990, 33 y.o.   MRN: [id number] Patient ID: Breckin Zafar, female   DOB: 1991-02-04, 33 y.o.   MRN: [id number]

## 2023-11-03 NOTE — Progress Notes (Signed)
   11/03/23 1000  Psych Admission Type (Psych Patients Only)  Admission Status Involuntary  Psychosocial Assessment  Patient Complaints None  Eye Contact Fair  Facial Expression Other (Comment)  Affect Appropriate to circumstance  Speech Logical/coherent  Interaction Assertive  Motor Activity Slow  Appearance/Hygiene Unremarkable  Behavior Characteristics Appropriate to situation  Mood Pleasant  Thought Process  Coherency Circumstantial  Content WDL  Delusions None reported or observed  Perception WDL  Hallucination None reported or observed  Judgment Poor  Confusion None  Danger to Self  Current suicidal ideation? Denies  Danger to Others  Danger to Others None reported or observed

## 2023-11-03 NOTE — B[facility] Group Notes (Signed)
 Adult Psychoeducational Group Note  Date:  11/03/2023 Time:  6:47 PM  Group Topic/Focus:  Goals Group:   The focus of this group is to help patients establish daily goals to achieve during treatment and discuss how the patient can incorporate goal setting into their daily lives to aide in recovery. Orientation:   The focus of this group is to educate the patient on the purpose and policies of crisis stabilization and provide a format to answer questions about their admission.  The group details unit policies and expectations of patients while admitted.  Participation Level:  Active  Participation Quality:  Appropriate  Affect:  Appropriate  Cognitive:  Appropriate  Insight: Appropriate  Engagement in Group:  Engaged  Modes of Intervention:  Discussion  Additional Comments:  Pt attended the goals group and remained appropriate and engaged throughout the duration of the group.   Sheran Lawless 11/03/2023, 6:47 PM

## 2023-11-03 NOTE — Plan of Care (Signed)
   Problem: Education: Goal: Emotional status will improve Outcome: Progressing Goal: Mental status will improve Outcome: Progressing

## 2023-11-03 NOTE — Progress Notes (Signed)
   11/03/23 0600  15 Minute Checks  Location Bedroom  Visual Appearance Calm  Behavior Sleeping  Sleep (Behavioral Health Patients Only)  Calculate sleep? (Click Yes once per 24 hr at 0600 safety check) Yes  Documented sleep last 24 hours 8

## 2023-11-03 NOTE — Progress Notes (Signed)
   11/03/23 1955  Psych Admission Type (Psych Patients Only)  Admission Status Involuntary  Psychosocial Assessment  Patient Complaints None  Eye Contact Fair  Facial Expression Other (Comment) (appropriate)  Affect Appropriate to circumstance  Speech Logical/coherent  Interaction Assertive  Motor Activity Slow  Appearance/Hygiene Unremarkable  Behavior Characteristics Cooperative;Appropriate to situation  Mood Pleasant  Thought Process  Coherency WDL  Content WDL  Delusions None reported or observed  Perception WDL  Hallucination None reported or observed  Judgment Limited  Confusion None  Danger to Self  Current suicidal ideation? Denies  Agreement Not to Harm Self Yes  Description of Agreement verbal  Danger to Others  Danger to Others None reported or observed   Progress note   D: Pt seen in her room. Pt denies SI, HI, AVH. Pt rates pain  0/10. Pt rates anxiety  0/10 and depression  0/10. Pt very pleasant and said she is ready to be discharged. No other concerns noted at this time.  A: Pt provided support and encouragement. Pt given scheduled medication as prescribed. PRNs as appropriate. Q15 min checks for safety.   R: Pt safe on the unit. Will continue to monitor.

## 2023-11-03 NOTE — BH IP Treatment Plan (Signed)
 Interdisciplinary Treatment and Diagnostic Plan Update  11/03/2023 Time of Session: 1220 - update Lauren Poole MRN: [id number]  Principal Diagnosis: Bipolar 1 disorder, mixed, severe (HCC)  Secondary Diagnoses: Principal Problem:   Bipolar 1 disorder, mixed, severe (HCC) Active Problems:   Abnormal urinalysis   Current Medications:  Current Facility-Administered Medications  Medication Dose Route Frequency Provider Last Rate Last Admin   acetaminophen (TYLENOL) tablet 650 mg  650 mg Oral Q6H PRN Motley-Mangrum, Jadeka A, PMHNP   650 mg at 11/02/23 2036   alum & mag hydroxide-simeth (MAALOX/MYLANTA) 200-200-20 MG/5ML suspension 30 mL  30 mL Oral Q4H PRN Motley-Mangrum, Jadeka A, PMHNP       haloperidol (HALDOL) tablet 5 mg  5 mg Oral TID PRN Motley-Mangrum, Jadeka A, PMHNP   5 mg at 10/29/23 2239   And   diphenhydrAMINE (BENADRYL) capsule 50 mg  50 mg Oral TID PRN Motley-Mangrum, Jadeka A, PMHNP   50 mg at 10/29/23 2239   haloperidol lactate (HALDOL) injection 5 mg  5 mg Intramuscular TID PRN Motley-Mangrum, Jadeka A, PMHNP       And   diphenhydrAMINE (BENADRYL) injection 50 mg  50 mg Intramuscular TID PRN Motley-Mangrum, Jadeka A, PMHNP       And   LORazepam (ATIVAN) injection 2 mg  2 mg Intramuscular TID PRN Motley-Mangrum, Jadeka A, PMHNP       haloperidol lactate (HALDOL) injection 10 mg  10 mg Intramuscular TID PRN Motley-Mangrum, Jadeka A, PMHNP       And   diphenhydrAMINE (BENADRYL) injection 50 mg  50 mg Intramuscular TID PRN Motley-Mangrum, Jadeka A, PMHNP       And   LORazepam (ATIVAN) injection 2 mg  2 mg Intramuscular TID PRN Motley-Mangrum, Jadeka A, PMHNP       hydrOXYzine (ATARAX) tablet 25 mg  25 mg Oral TID PRN Motley-Mangrum, Jadeka A, PMHNP   25 mg at 10/29/23 0653   magnesium hydroxide (MILK OF MAGNESIA) suspension 30 mL  30 mL Oral Daily PRN Motley-Mangrum, Jadeka A, PMHNP       nicotine polacrilex (NICORETTE) gum 2 mg  2 mg Oral PRN Massengill, Harrold Donath,  MD   2 mg at 10/31/23 1455   OLANZapine zydis (ZYPREXA) disintegrating tablet 10 mg  10 mg Oral BID Starleen Blue, NP   10 mg at 11/03/23 0826   Tdap (BOOSTRIX) injection 0.5 mL  0.5 mL Intramuscular Once Motley-Mangrum, Jadeka A, PMHNP       traZODone (DESYREL) tablet 50 mg  50 mg Oral QHS Park Pope, MD   50 mg at 11/02/23 2037   Vitamin D (Ergocalciferol) (DRISDOL) 1.25 MG (50000 UNIT) capsule 50,000 Units  50,000 Units Oral Q7 days Starleen Blue, NP   50,000 Units at 11/01/23 1627   PTA Medications: No medications prior to admission.    Patient Stressors: Medication change or noncompliance   Substance abuse    Patient Strengths: Capable of independent living  General fund of knowledge   Treatment Modalities: Medication Management, Group therapy, Case management,  1 to 1 session with clinician, Psychoeducation, Recreational therapy.   Physician Treatment Plan for Primary Diagnosis: Bipolar 1 disorder, mixed, severe (HCC) Long Term Goal(s): Improvement in symptoms so as ready for discharge   Short Term Goals: Ability to identify changes in lifestyle to reduce recurrence of condition will improve Ability to verbalize feelings will improve Ability to disclose and discuss suicidal ideas Ability to demonstrate self-control will improve Ability to identify and develop effective coping behaviors will improve  Ability to maintain clinical measurements within normal limits will improve Ability to identify triggers associated with substance abuse/mental health issues will improve  Medication Management: Evaluate patient's response, side effects, and tolerance of medication regimen.  Therapeutic Interventions: 1 to 1 sessions, Unit Group sessions and Medication administration.  Evaluation of Outcomes: Progressing  Physician Treatment Plan for Secondary Diagnosis: Principal Problem:   Bipolar 1 disorder, mixed, severe (HCC) Active Problems:   Abnormal urinalysis  Long Term Goal(s):  Improvement in symptoms so as ready for discharge   Short Term Goals: Ability to identify changes in lifestyle to reduce recurrence of condition will improve Ability to verbalize feelings will improve Ability to disclose and discuss suicidal ideas Ability to demonstrate self-control will improve Ability to identify and develop effective coping behaviors will improve Ability to maintain clinical measurements within normal limits will improve Ability to identify triggers associated with substance abuse/mental health issues will improve     Medication Management: Evaluate patient's response, side effects, and tolerance of medication regimen.  Therapeutic Interventions: 1 to 1 sessions, Unit Group sessions and Medication administration.  Evaluation of Outcomes: Progressing   RN Treatment Plan for Primary Diagnosis: Bipolar 1 disorder, mixed, severe (HCC) Long Term Goal(s): Knowledge of disease and therapeutic regimen to maintain health will improve  Short Term Goals: Ability to remain free from injury will improve, Ability to demonstrate self-control, Ability to verbalize feelings will improve, Ability to disclose and discuss suicidal ideas, Ability to identify and develop effective coping behaviors will improve, and Compliance with prescribed medications will improve  Medication Management: RN will administer medications as ordered by provider, will assess and evaluate patient's response and provide education to patient for prescribed medication. RN will report any adverse and/or side effects to prescribing provider.  Therapeutic Interventions: 1 on 1 counseling sessions, Psychoeducation, Medication administration, Evaluate responses to treatment, Monitor vital signs and CBGs as ordered, Perform/monitor CIWA, COWS, AIMS and Fall Risk screenings as ordered, Perform wound care treatments as ordered.  Evaluation of Outcomes: Progressing   LCSW Treatment Plan for Primary Diagnosis: Bipolar 1  disorder, mixed, severe (HCC) Long Term Goal(s): Safe transition to appropriate next level of care at discharge, Engage patient in therapeutic group addressing interpersonal concerns.  Short Term Goals: Engage patient in aftercare planning with referrals and resources, Increase ability to appropriately verbalize feelings, Facilitate acceptance of mental health diagnosis and concerns, Facilitate patient progression through stages of change regarding substance use diagnoses and concerns, and Identify triggers associated with mental health/substance abuse issues  Therapeutic Interventions: Assess for all discharge needs, 1 to 1 time with Social worker, Explore available resources and support systems, Assess for adequacy in community support network, Educate family and significant other(s) on suicide prevention, Complete Psychosocial Assessment, Interpersonal group therapy.  Evaluation of Outcomes: Progressing   Progress in Treatment: Attending groups: Some Participating in groups: Some. Taking medication as prescribed: Yes Toleration medication: Yes. Family/Significant other contact made: Yes, [name] (mom) [phone] Patient understands diagnosis: No. Discussing patient identified problems/goals with staff: No Medical problems stabilized or resolved: Yes Denies suicidal/homicidal ideation: Yes Issues/concerns per patient self-inventory: No.   New problem(s) identified: No   New Short Term/Long Term Goal(s):      medication stabilization, elimination of SI thoughts, development of comprehensive mental wellness plan.    Patient Goals:  "I don't want any goals."  Patient declined participation in the treatment team.    Discharge Plan or Barriers:  Patient recently admitted. CSW will continue to follow and assess for  appropriate referrals and possible discharge planning.    Reason for Continuation of Hospitalization: Depression Medication stabilization Suicidal ideation    Estimated Length of Stay:  1-2 days  Last 3 [place] Suicide Severity Risk Score: Flowsheet Row [facility] (Current[facility] Most recent reading at 10/28/2023  9:00 PM ED from 10/28/2023 in [facility] Emergency Department at [facility] Most recent reading at 10/28/2023 10:31 AM ED from 10/08/2022 in [facility] Emergency Department at [facility] Most recent reading at 10/08/2022  2:41 PM  C-SSRS RISK CATEGORY High Risk High Risk No Risk       Last PHQ 2/9 Scores:     No data to display          Scribe for Treatment Team: Kathi Der, LCSWA 11/03/2023 3:30 PM

## 2023-11-04 DIAGNOSIS — F3163 Bipolar disorder, current episode mixed, severe, without psychotic features: Secondary | ICD-10-CM | POA: Diagnosis not present

## 2023-11-04 MED ORDER — OLANZAPINE 10 MG PO TBDP: 10.0000 mg | ORAL_TABLET | Freq: Two times a day (BID) | ORAL | 0 refills | Status: DC

## 2023-11-04 MED ORDER — NICOTINE POLACRILEX 2 MG MT GUM: 2.0000 mg | CHEWING_GUM | OROMUCOSAL | Status: DC | PRN

## 2023-11-04 MED ORDER — HYDROXYZINE HCL 25 MG PO TABS: 25.0000 mg | ORAL_TABLET | Freq: Three times a day (TID) | ORAL | 0 refills | Status: DC | PRN

## 2023-11-04 MED ORDER — TRAZODONE HCL 50 MG PO TABS: 50.0000 mg | ORAL_TABLET | Freq: Every day | ORAL | 0 refills | Status: DC

## 2023-11-04 MED ORDER — SODIUM CHLORIDE 0.9 % IN NEBU
INHALATION_SOLUTION | RESPIRATORY_TRACT | Status: AC
Start: 2023-11-04 — End: 2023-11-04
  Administered 2023-11-04: 1 mL
  Filled 2023-11-04: qty 3

## 2023-11-04 MED ORDER — VITAMIN D (ERGOCALCIFEROL) 1.25 MG (50000 UNIT) PO CAPS: 50000.0000 [IU] | ORAL_CAPSULE | ORAL | 0 refills | Status: DC

## 2023-11-04 NOTE — [facility] Suicide Risk Assessment (Signed)
 Suicide Risk Assessment  Discharge Assessment    [facility] Discharge Suicide Risk Assessment   Principal Problem: Bipolar 1 disorder, mixed, severe (HCC)  Discharge Diagnoses: Principal Problem:   Bipolar 1 disorder, mixed, severe (HCC) Active Problems:   Abnormal urinalysis  Total Time spent with patient:  Greater than 30 minutes   Musculoskeletal: Strength & Muscle Tone: within normal limits Gait & Station: normal Patient leans: N/A  Psychiatric Specialty Exam  Presentation  General Appearance:  Casual; Appropriate for Environment; Fairly Groomed  Eye Contact: Good  Speech: Clear and Coherent; Normal Rate  Speech Volume: Normal  Handedness: Right   Mood and Affect  Mood: Euthymic  Duration of Depression Symptoms: No data recorded Affect: Appropriate; Congruent   Thought Process  Thought Processes: Coherent; Goal Directed; Linear  Minutes.  Descriptions of Associations:Intact  Orientation:Full (Time, Place and Person)  Thought Content:Logical  History of Schizophrenia/Schizoaffective disorder:No  Duration of Psychotic Symptoms:N/A  Hallucinations:Hallucinations: None Description of Auditory Hallucinations: NA  Ideas of Reference:None  Suicidal Thoughts:Suicidal Thoughts: No  Homicidal Thoughts:Homicidal Thoughts: No   Sensorium  Memory: Immediate Good; Recent Good; Remote Good  Judgment: Fair  Insight: Fair  Art therapist  Concentration: Good  Attention Span: Good  Recall: Good  Fund of Knowledge: Fair  Language: Good  Psychomotor Activity  Psychomotor Activity:Psychomotor Activity: Normal  Assets  Assets: Communication Skills; Desire for Improvement; Housing; Resilience; Social Support  Sleep  Sleep:Sleep: Good Number of Hours of Sleep: 8  Physical Exam: See H&P.  Blood pressure 115/79, pulse 72, temperature 98 F (36.7 C), temperature source Oral, resp. rate 20, height 5\' 4"  (1.626 m), weight 113 kg,  SpO2 99%. Body mass index is 42.76 kg/m.  Mental Status Per Nursing Assessment::   On Admission:  Suicidal ideation indicated by others  Demographic Factors:  Adolescent or young adult and Low socioeconomic status  Loss Factors: NA  Historical Factors: Impulsivity  Risk Reduction Factors:   Sense of responsibility to family, Living with another person, especially a relative, Positive social support, Positive therapeutic relationship, and Positive coping skills or problem solving skills  Continued Clinical Symptoms:  Bipolar Disorder:   Mixed State Alcohol/Substance Abuse/Dependencies More than one psychiatric diagnosis  Cognitive Features That Contribute To Risk:  Polarized thinking    Suicide Risk:  Minimal: No identifiable suicidal ideation.  Patients presenting with no risk factors but with morbid ruminations; may be classified as minimal risk based on the severity of the depressive symptoms   Follow-up Information     [facility]. Go on 11/09/2023.   Specialty: Behavioral Health Why: Please go to this provider on 11/09/2023 at 7 AM for medication management services.  If the time presented is not feasible, you can walk-in Monday through Friday, arrive by 7:00 AM. Contact information: [address] [place] [address] [phone]        [facility]. Go on 11/10/2023.   Specialty: Professional Counselor Why: Please go to this provider on 11/05/2023 at 9 AM for therapy services.  If the time presented is not feasible, you can walk-in Monday through Friday, from 9 AM to 1 PM. Contact information: [address] [place] [address] [phone]                Plan Of Care/Follow-up recommendations:  See the discharge recommendations above.  Armandina Stammer, NP, pmhnp, fnp-bc. 11/04/2023, 10:05 AM

## 2023-11-04 NOTE — Progress Notes (Signed)
   11/04/23 0604  15 Minute Checks  Location Hallway  Visual Appearance Calm  Behavior Sleeping  Sleep (Behavioral Health Patients Only)  Calculate sleep? (Click Yes once per 24 hr at 0600 safety check) Yes  Documented sleep last 24 hours 9.5

## 2023-11-04 NOTE — Progress Notes (Addendum)
  [facility] Adult Case Management Discharge Plan :  Will you be returning to the same living situation after discharge:  Yes,  patient will return to her apartment. At discharge, do you have transportation home?: Yes,  patient's mother will pick her up at 2 PM.   Do you have the ability to pay for your medications: No, patient doesn't have insurance, and will get free samples.  Release of information consent forms completed and in the chart;  Patient's signature needed at discharge.  Patient to Follow up at:  Follow-up Information     [facility]. Go on 11/09/2023.   Specialty: Behavioral Health Why: Please go to this provider on 11/09/2023 at 7 AM for medication management services.  If the time presented is not feasible, you can walk-in Monday through Friday, arrive by 7:00 AM. Contact information: [address] [place] [address] [phone]        [facility] Go on 11/10/2023.   Specialty: Professional Counselor Why: Please go to this provider on 11/05/2023 at 9 AM for therapy services.  If the time presented is not feasible, you can walk-in Monday through Friday, from 9 AM to 1 PM. Contact information: [address] [place] [address] [phone]                 Next level of care provider has access to [facility] Link:no  Safety Planning and Suicide Prevention discussed: Yes,  [name] (mom) [phone].   Today mom confirmed that she will pick her up, they will go to patient's apartment and will secure medications and sharp objects (such as knives, scissors).  Has patient been referred to the Quitline?: Patient refused referral for treatment  Patient has been referred for addiction treatment: Patient refused referral for treatment.  Patient said that she used marijuana 2 weeks ago, and will stop using on her own.  She said that she doesn't need therapist's help to stop using.   Froilan Mclean O  Tamikia Chowning, LCSWA 11/04/2023, 9:46 AM

## 2023-11-04 NOTE — Group Note (Signed)
 Recreation Therapy Group Note   Group Topic:Leisure Education  Group Date: 11/04/2023 Start Time: 1010 End Time: 1040 Facilitators: Ahtziri Jeffries-McCall, LRT,CTRS Location: [address] Dayroom   Group Topic: Leisure Education  Goal Area(s) Addresses:  Patient will identify positive leisure activities for use post discharge. Patient will identify at least one positive benefit of participation in leisure activities.  Patient will work effectively work with peer by sharing ideas and contributing to Social worker.  Intervention: Team Building, Group Presentation   Activity: Patients were placed in a circle and given a beach ball. Patients were to hit the ball to each like playing volleyball. The ball was to stay moving at all times. The ball could roll or bounce on the floor as long as it didn't stop moving. In the process, LRT timed the patients to see how long they could keep the ball in motion. If at any point the ball stopped, the timer would start over.  Education:  Leisure Scientist, physiological, Special educational needs teacher, Teamwork, Discharge Planning  Education Outcome: Acknowledges education/In group clarification offered/Needs additional education.    Affect/Mood: Appropriate   Participation Level: Engaged   Participation Quality: Independent   Behavior: Appropriate   Speech/Thought Process: Focused   Insight: Good   Judgement: Good   Modes of Intervention: Cooperative Play   Patient Response to Interventions:  Engaged   Education Outcome:  In group clarification offered    Clinical Observations/Individualized Feedback: Pt was bright and engaged during group. Pt identified leisure as things you do in your free time. Pt stated she likes to make things, be with her dogs, do her make-up, etc. Pt was social with peers. Pt was appropriate and encouraging to peers. Pt asked about volunteering at some point after she discharges. Pt expressed wanting to volunteer because "sometimes people need  guidance". Pt stated she was here because she "crashed out". Pt went on to say she didn't know about her diagnosis or the medication. Pt admitted to feeling better since being started on the medication. Pt would encourage peers to stick to good habits and watch out for the negative things that can surface in life. Pt was bright throughout group session.    Plan: Continue to engage patient in RT group sessions 2-3x/week.   Eagan Shifflett-McCall, LRT,CTRS 11/04/2023 11:29 AM

## 2023-11-04 NOTE — [facility] Suicide Risk Assessment (Addendum)
 [facility] INPATIENT:  Family/Significant Other Suicide Prevention Education  Suicide Prevention Education:  Education Completed; Lauren Poole (boyfriend) [phone],  (name of family member/significant other) has been identified by the patient as the family member/significant other with whom the patient will be residing, and identified as the person(s) who will aid the patient in the event of a mental health crisis (suicidal ideations/suicide attempt).  With written consent from the patient, the family member/significant other has been provided the following suicide prevention education, prior to the and/or following the discharge of the patient.  Boyfriend said that patient doesn't have any guns.  He said that if he picks her up from the hospital, he will go with her to her apartment to secure medications and sharp objects (such as knives and scissors).  When asked if he had any concerns with patient returning home, he said that patient is doing well.  He said that he thought she would be in the hospital longer, but he added that he is not a doctor and trusts the doctor's opinion.  Boyfriend said that he visited the patient yesterday. "She sounded okay; she seemed like her normal self." He added, "I don't know if she grasped the severity of that day.  She was joking about the situation."   Boyfriend explained that patient was admitted to the hospital because she was paranoid, thinking that people were following her and trying to hurt her, though she couldn't identify who they were.  She couldn't drive because she thought people were trying to run her off the road.  Boyfriend believed it was caused by a lack of sleep, as patient hadn't slept for 24 hours; however, he added that "it has been going on for months."  The suicide prevention education provided includes the following: Suicide risk factors Suicide prevention and interventions National Suicide Hotline telephone number [facility] assessment telephone number [facility] Emergency Assistance [address] [facility] and/or Residential Mobile Crisis Unit telephone number  Request made of family/significant other to: Remove weapons (e.g., guns, rifles, knives), all items previously/currently identified as safety concern.   Remove drugs/medications (over-the-counter, prescriptions, illicit drugs), all items previously/currently identified as a safety concern.  The family member/significant other verbalizes understanding of the suicide prevention education information provided.  The family member/significant other agrees to remove the items of safety concern listed above.  Alla Feeling, LCSWA 11/03/2023

## 2023-11-04 NOTE — Discharge Summary (Signed)
 Physician Discharge Summary Note  Patient:  Lauren Poole is an 33 y.o., female  MRN:  [id number]  DOB:  11-Sep-1990  Patient phone:  [phone] (home)   Patient address:   [address] [place] [address],   Total Time spent with patient:  Greater than 30 minutes  Date of Admission:  10/28/2023  Date of Discharge: 11-04-23  Reason for Admission: Active suicide attempt. Patient reportedly used a kitchen knife to cut her left wrist, resulting in a gaping wound with tendon involvement..  Principal Problem: Bipolar 1 disorder, mixed, severe (HCC)  Discharge Diagnoses: Principal Problem:   Bipolar 1 disorder, mixed, severe (HCC) Active Problems:   Abnormal urinalysis  Past Psychiatric History: Bipolar disorder, mixed episodes.  Past Medical History:  Past Medical History:  Diagnosis Date   Chlamydia    Gonorrhea     Past Surgical History:  Procedure Laterality Date   NO PAST SURGERIES     Family History:  Family History  Problem Relation Age of Onset   Schizophrenia Sister    Family Psychiatric  History: See H&P.  Social History:  Social History   Substance and Sexual Activity  Alcohol Use Yes   Alcohol/week: 2.0 standard drinks of alcohol   Types: 2 Glasses of wine per week     Social History   Substance and Sexual Activity  Drug Use Yes   Types: Marijuana    Social History   Socioeconomic History   Marital status: Single    Spouse name: Not on file   Number of children: Not on file   Years of education: Not on file   Highest education level: Not on file  Occupational History   Not on file  Tobacco Use   Smoking status: Light Smoker    Current packs/day: 0.50    Average packs/day: 0.5 packs/day for 6.1 years (3.0 ttl pk-yrs)    Types: Cigarettes    Start date: 10/2017   Smokeless tobacco: Never  Vaping Use   Vaping status: Never Used  Substance and Sexual Activity   Alcohol use: Yes    Alcohol/week: 2.0  standard drinks of alcohol    Types: 2 Glasses of wine per week   Drug use: Yes    Types: Marijuana   Sexual activity: Yes    Birth control/protection: None  Other Topics Concern   Not on file  Social History Narrative   Not on file   Social Drivers of Health   Financial Resource Strain: Not on file  Food Insecurity: No Food Insecurity (10/29/2023)   Hunger Vital Sign    Worried About Running Out of Food in the Last Year: Never true    Ran Out of Food in the Last Year: Never true  Transportation Needs: No Transportation Needs (10/29/2023)   PRAPARE - Administrator, Civil Service (Medical): No    Lack of Transportation (Non-Medical): No  Physical Activity: Not on file  Stress: Not on file  Social Connections: Not on file   Hospital Course: (Per admission evaluation notes): 33 year-old African American who initially presented to the [facility] ED on 10/28/2023 following an active suicide attempt. She reportedly used a kitchen knife to cut her left wrist, resulting in a gaping wound with tendon involvement, though bleeding was controlled upon arrival. At the time, she expressed a desire to die and was actively suicidal, with mildly pressured speech noted. She has a prior psychiatric history significant of MDD, mood disorder and alcohol  abuse. She was admitted to [facility] on 10/28/2023 for further stabilization and treatment.   Prior to this discharge, Lauren Poole was seen & evaluated for mood stability. The current laboratory findings were reviewed (stable), nurses notes & vital signs were reviewed as well. There are no current mental health or medical issues that should prevent this discharge at this time. Patient is being discharged to continue mental health care as noted below.   After the above admission evaluation, Lauren Poole's presenting symptoms were noted. She was recommended for mood stabilization treatments. The medication regimen targeting those presenting  symptoms were discussed & initiated with her consent. She was medicated, stabilized & discharged on the medications as listed on her discharge medication lists below. Besides the mood stabilization treatments, patient was was also enrolled & participated in the group counseling sessions being offered & held on this unit. She learned coping skills. She presented no other significant pre-existing medical issues (wound to her left wrist) that required treatment/monitoring. She tolerated her treatment regimen without any adverse effects or reactions reported. Lauren Poole's symptoms responded well to her treatment regimen warranting this discharge. Patient is also mentally/medically stable & agreeable to this discharge.  During the course of this this hospitalization, there were no instances of behavioral issues that required restraints or immediate intervention. Patient remained safe on the unit. There were no instances of self-harming behaviors. There were no threats to other patients/staff. Patient remained cooperative to her daily routines & in taking her treatment regimen as recommended by her treatment team. She participated in the group sessions and interacted with staff and other patients appropriately. She learned coping skills.  Over the course of this hospitalization, patient's symptoms responded well to her treatment regimen & her mood has improved. On this day of her hospital discharge, patient denies any thoughts of self-harm, suicidal/homicidal ideations, auditory/visual hallucinations, delusional thoughts or paranoia. She is not observed to be responding to internal any internal stimuli. She has been compliant with her recommended treatment regimen. She is currently showing readiness for discharge & is future-oriented thinking. Patient is encouraged to keep all psychiatric appointments and continue with his medications as prescribed. She was able to engage in safety planning including plan to return to  [facility] or contact emergency services if she feels unable to maintain her own safety or the safety of others. Patient had no further questions, comments, or concerns. She left [facility] with all personal belongings in no apparent distress. Transportation per her family (mother).     Physical Findings: AIMS:  , ,  ,  ,    CIWA:    COWS:     Musculoskeletal: Strength & Muscle Tone: within normal limits Gait & Station: normal Patient leans: N/A  Psychiatric Specialty Exam:  Presentation  General Appearance:  Casual; Appropriate for Environment; Fairly Groomed  Eye Contact: Good  Speech: Clear and Coherent; Normal Rate  Speech Volume: Normal  Handedness: Right   Mood and Affect  Mood: Euthymic  Affect: Appropriate; Congruent   Thought Process  Thought Processes: Coherent; Goal Directed; Linear  Descriptions of Associations:Intact  Orientation:Full (Time, Place and Person)  Thought Content:Logical  History of Schizophrenia/Schizoaffective disorder:No  Duration of Psychotic Symptoms:N/A  Hallucinations:Hallucinations: None Description of Auditory Hallucinations: NA  Ideas of Reference:None  Suicidal Thoughts:Suicidal Thoughts: No  Homicidal Thoughts:Homicidal Thoughts: No  Sensorium  Memory: Immediate Good; Recent Good; Remote Good  Judgment: Fair  Insight: Fair  Executive Functions  Concentration: Good  Attention Span: Good  Recall: Dudley Major  of Knowledge: Fair  Language: Good  Psychomotor Activity  Psychomotor Activity: Psychomotor Activity: Normal  Assets  Assets: Communication Skills; Desire for Improvement; Housing; Resilience; Social Support  Sleep  Sleep: Sleep: Good Number of Hours of Sleep: 8  Physical Exam: Physical Exam Vitals and nursing note reviewed.  HENT:     Head: Normocephalic.     Nose: Nose normal.     Mouth/Throat:     Pharynx: Oropharynx is clear.  Cardiovascular:     Rate and Rhythm: Normal rate.      Pulses: Normal pulses.  Pulmonary:     Effort: Pulmonary effort is normal.  Genitourinary:    Comments: Deferred Musculoskeletal:        General: Normal range of motion.     Cervical back: Normal range of motion.  Skin:    General: Skin is warm and dry.  Neurological:     General: No focal deficit present.     Mental Status: She is alert and oriented to person, place, and time. Mental status is at baseline.    Review of Systems  Constitutional:  Negative for chills, diaphoresis and fever.  HENT:  Negative for congestion and sore throat.   Respiratory:  Negative for cough, shortness of breath and wheezing.   Cardiovascular:  Negative for chest pain and palpitations.  Gastrointestinal:  Negative for abdominal pain, blood in stool, constipation, diarrhea, heartburn, melena, nausea and vomiting.  Genitourinary:  Negative for dysuria.  Musculoskeletal:  Negative for joint pain and myalgias.  Neurological:  Negative for dizziness, tingling, tremors, sensory change, speech change, focal weakness, seizures, loss of consciousness, weakness and headaches.  Psychiatric/Behavioral:  Positive for substance abuse (Hx thc use disorder.). Negative for hallucinations, memory loss and suicidal ideas. The patient has insomnia. The patient is not nervous/anxious (Hx of (stable on medication).).    Blood pressure 115/79, pulse 72, temperature 98 F (36.7 C), temperature source Oral, resp. rate 20, height 5\' 4"  (1.626 m), weight 113 kg, SpO2 99%. Body mass index is 42.76 kg/m.   Social History   Tobacco Use  Smoking Status Light Smoker   Current packs/day: 0.50   Average packs/day: 0.5 packs/day for 6.1 years (3.0 ttl pk-yrs)   Types: Cigarettes   Start date: 10/2017  Smokeless Tobacco Never   Tobacco Cessation:  A prescription for an FDA-approved tobacco cessation medication provided at discharge  Blood Alcohol level:  No results found for: "ETH"  Metabolic Disorder Labs:  Lab Results   Component Value Date   HGBA1C 5.1 10/31/2023   MPG 99.67 10/31/2023   No results found for: "PROLACTIN" Lab Results  Component Value Date   CHOL 136 10/31/2023   TRIG 75 10/31/2023   HDL 37 (L) 10/31/2023   CHOLHDL 3.7 10/31/2023   VLDL 15 10/31/2023   LDLCALC 84 10/31/2023    See Psychiatric Specialty Exam and Suicide Risk Assessment completed by Attending Physician prior to discharge.  Discharge destination:  Home  Is patient on multiple antipsychotic therapies at discharge:  No   Has Patient had three or more failed trials of antipsychotic monotherapy by history:  No  Recommended Plan for Multiple Antipsychotic Therapies: NA  Discharge Instructions     No wound care   Complete by: As directed       Allergies as of 11/04/2023   No Known Allergies      Medication List     TAKE these medications      Indication  hydrOXYzine 25 MG tablet Commonly known  as: ATARAX Take 1 tablet (25 mg total) by mouth 3 (three) times daily as needed for anxiety.  Indication: Feeling Anxious   nicotine polacrilex 2 MG gum Commonly known as: NICORETTE Take 1 each (2 mg total) by mouth as needed. (May buy from over the counter): For smoking cessation.  Indication: Nicotine Addiction   OLANZapine zydis 10 MG disintegrating tablet Commonly known as: ZYPREXA Take 1 tablet (10 mg total) by mouth 2 (two) times daily. For mood control  Indication: Mood control   traZODone 50 MG tablet Commonly known as: DESYREL Take 1 tablet (50 mg total) by mouth at bedtime. For sleep.  Indication: Trouble Sleeping   Vitamin D (Ergocalciferol) 1.25 MG (50000 UNIT) Caps capsule Commonly known as: DRISDOL Take 1 capsule (50,000 Units total) by mouth every 7 (seven) days. For bone health Start taking on: November 08, 2023  Indication: Vitamin D Deficiency        Follow-up Information     [facility]. Go on 11/09/2023.   Specialty: Behavioral Health Why: Please go  to this provider on 11/09/2023 at 7 AM for medication management services.  If the time presented is not feasible, you can walk-in Monday through Friday, arrive by 7:00 AM. Contact information: [address] [place] [address] [phone]        [facility]. Go on 11/10/2023.   Specialty: Professional Counselor Why: Please go to this provider on 11/05/2023 at 9 AM for therapy services.  If the time presented is not feasible, you can walk-in Monday through Friday, from 9 AM to 1 PM. Contact information: [address] [place] [address] [phone]                Follow-up recommendations:Activity:  As tolerated Diet: As recommended by your primary care doctor. Keep all scheduled follow-up appointments as recommended.    Comments:  Comments: Patient is recommended to follow-up care on an outpatient basis as noted above. Prescriptions sent to pt's pharmacy of choice at discharge.   Patient agreeable to plan.   Given opportunity to ask questions.   Appears to feel comfortable with discharge denies any current suicidal or homicidal thought. Patient is also instructed prior to discharge to: Take all medications as prescribed by his/her mental healthcare provider. Report any adverse effects and or reactions from the medicines to his/her outpatient provider promptly. Patient has been instructed & cautioned: To not engage in alcohol and or illegal drug use while on prescription medicines. In the event of worsening symptoms, patient is instructed to call the crisis hotline, 911 and or go to the nearest ED for appropriate evaluation and treatment of symptoms. To follow-up with his/her primary care provider for your other medical issues, concerns and or health care needs.  Signed: Armandina Stammer, NP, pmhnp, fnp-bc. 11/04/2023, 10:14 AM

## 2023-11-04 NOTE — Progress Notes (Signed)
 Pt discharged to lobby. Pt was stable and appreciative at that time. All papers, samples and prescriptions were given and valuables returned. Verbal understanding expressed. Denies SI/HI and A/VH. Pt given opportunity to express concerns and ask questions.

## 2023-11-04 NOTE — Plan of Care (Signed)
  Problem: Education: Goal: Emotional status will improve Outcome: Progressing Goal: Mental status will improve Outcome: Progressing Goal: Verbalization of understanding the information provided will improve Outcome: Progressing   Problem: Activity: Goal: Interest or engagement in activities will improve Outcome: Progressing Goal: Sleeping patterns will improve Outcome: Progressing   Problem: Health Behavior/Discharge Planning: Goal: Compliance with treatment plan for underlying cause of condition will improve Outcome: Progressing   Problem: Safety: Goal: Periods of time without injury will increase Outcome: Progressing

## 2023-11-21 ENCOUNTER — Emergency Department (HOSPITAL_COMMUNITY)
Admission: EM | Admit: 2023-11-21 | Discharge: 2023-11-22 | Disposition: A | Discharge status: Other Acute Inpatient Hospital | Payer: Self-pay | Attending: Emergency Medicine | Admitting: Emergency Medicine

## 2023-11-21 ENCOUNTER — Other Ambulatory Visit: Payer: Self-pay

## 2023-11-21 ENCOUNTER — Encounter (HOSPITAL_COMMUNITY): Payer: Self-pay

## 2023-11-21 DIAGNOSIS — F419 Anxiety disorder, unspecified: Secondary | ICD-10-CM | POA: Diagnosis not present

## 2023-11-21 DIAGNOSIS — F29 Unspecified psychosis not due to a substance or known physiological condition: Secondary | ICD-10-CM | POA: Diagnosis present

## 2023-11-21 DIAGNOSIS — R45851 Suicidal ideations: Secondary | ICD-10-CM | POA: Insufficient documentation

## 2023-11-21 DIAGNOSIS — F333 Major depressive disorder, recurrent, severe with psychotic symptoms: Secondary | ICD-10-CM | POA: Diagnosis present

## 2023-11-21 DIAGNOSIS — F411 Generalized anxiety disorder: Secondary | ICD-10-CM | POA: Insufficient documentation

## 2023-11-21 DIAGNOSIS — F323 Major depressive disorder, single episode, severe with psychotic features: Secondary | ICD-10-CM | POA: Diagnosis present

## 2023-11-21 DIAGNOSIS — F3163 Bipolar disorder, current episode mixed, severe, without psychotic features: Secondary | ICD-10-CM | POA: Diagnosis present

## 2023-11-21 LAB — HCG, SERUM, QUALITATIVE: Preg, Serum: NEGATIVE

## 2023-11-21 LAB — URINALYSIS, ROUTINE W REFLEX MICROSCOPIC
Bilirubin Urine: NEGATIVE
Glucose, UA: NEGATIVE mg/dL
Ketones, ur: NEGATIVE mg/dL
Leukocytes,Ua: NEGATIVE
Nitrite: NEGATIVE
Protein, ur: NEGATIVE mg/dL
Specific Gravity, Urine: 1.02 (ref 1.005–1.030)
pH: 5 (ref 5.0–8.0)

## 2023-11-21 LAB — COMPREHENSIVE METABOLIC PANEL WITH GFR
ALT: 31 U/L (ref 0–44)
AST: 27 U/L (ref 15–41)
Albumin: 4.2 g/dL (ref 3.5–5.0)
Alkaline Phosphatase: 87 U/L (ref 38–126)
Anion gap: 8 (ref 5–15)
BUN: 8 mg/dL (ref 6–20)
CO2: 21 mmol/L — ABNORMAL LOW (ref 22–32)
Calcium: 9 mg/dL (ref 8.9–10.3)
Chloride: 107 mmol/L (ref 98–111)
Creatinine, Ser: 0.62 mg/dL (ref 0.44–1.00)
GFR, Estimated: >60 mL/min
Glucose, Bld: 97 mg/dL (ref 70–99)
Potassium: 3.9 mmol/L (ref 3.5–5.1)
Sodium: 136 mmol/L (ref 135–145)
Total Bilirubin: 0.7 mg/dL (ref 0.0–1.2)
Total Protein: 8.1 g/dL (ref 6.5–8.1)

## 2023-11-21 LAB — ETHANOL: Alcohol, Ethyl (B): <10 mg/dL (ref <10)

## 2023-11-21 LAB — CBC WITH DIFFERENTIAL/PLATELET
Abs Immature Granulocytes: 0.03 K/uL (ref 0.00–0.07)
Basophils Absolute: 0 K/uL (ref 0.0–0.1)
Basophils Relative: 1 %
Eosinophils Absolute: 0.4 K/uL (ref 0.0–0.5)
Eosinophils Relative: 6 %
HCT: 41.2 % (ref 36.0–46.0)
Hemoglobin: 13.3 g/dL (ref 12.0–15.0)
Immature Granulocytes: 1 %
Lymphocytes Relative: 22 %
Lymphs Abs: 1.4 K/uL (ref 0.7–4.0)
MCH: 32 pg (ref 26.0–34.0)
MCHC: 32.3 g/dL (ref 30.0–36.0)
MCV: 99.3 fL (ref 80.0–100.0)
Monocytes Absolute: 0.4 K/uL (ref 0.1–1.0)
Monocytes Relative: 7 %
Neutro Abs: 4.2 K/uL (ref 1.7–7.7)
Neutrophils Relative %: 63 %
Platelets: 237 K/uL (ref 150–400)
RBC: 4.15 MIL/uL (ref 3.87–5.11)
RDW: 13.2 % (ref 11.5–15.5)
WBC: 6.5 K/uL (ref 4.0–10.5)
nRBC: 0 % (ref 0.0–0.2)

## 2023-11-21 LAB — RAPID URINE DRUG SCREEN, HOSP PERFORMED
Amphetamines: NOT DETECTED
Barbiturates: NOT DETECTED
Benzodiazepines: NOT DETECTED
Cocaine: NOT DETECTED
Opiates: NOT DETECTED
Tetrahydrocannabinol: NOT DETECTED

## 2023-11-21 LAB — SARS CORONAVIRUS 2 BY RT PCR: SARS Coronavirus 2 by RT PCR: NEGATIVE

## 2023-11-21 LAB — SALICYLATE LEVEL: Salicylate Lvl: <7 mg/dL — ABNORMAL LOW (ref 7.0–30.0)

## 2023-11-21 LAB — ACETAMINOPHEN LEVEL: Acetaminophen (Tylenol), Serum: <10 ug/mL — ABNORMAL LOW (ref 10–30)

## 2023-11-21 MED ORDER — HYDROXYZINE HCL 25 MG PO TABS: 25.0000 mg | ORAL_TABLET | Freq: Three times a day (TID) | ORAL | Status: DC | PRN

## 2023-11-21 MED ORDER — OLANZAPINE 5 MG PO TBDP
5.0000 mg | ORAL_TABLET | Freq: Two times a day (BID) | ORAL | Status: DC
Start: 2023-11-21 — End: 2023-11-22
  Administered 2023-11-21 – 2023-11-22 (×3): 5 mg via ORAL
  Filled 2023-11-21 (×3): qty 1

## 2023-11-21 MED ORDER — HALOPERIDOL LACTATE 5 MG/ML IJ SOLN
5.0000 mg | Freq: Four times a day (QID) | INTRAMUSCULAR | Status: DC | PRN
Start: 2023-11-21 — End: 2023-11-22

## 2023-11-21 MED ORDER — LORAZEPAM 2 MG/ML IJ SOLN: 2.0000 mg | Freq: Four times a day (QID) | INTRAMUSCULAR | Status: DC | PRN

## 2023-11-21 NOTE — ED Notes (Signed)
 Pt has been seen and wand by security.   Pt has belongings in cabinet 9-12.

## 2023-11-21 NOTE — ED Notes (Signed)
 Pt pacing hallway, talking to people who are not there, pt making threats  to take people's children and saying they will never see their children again. Pt becoming agitated. Pt stated " I'm already dead."

## 2023-11-21 NOTE — ED Notes (Signed)
 Pt states we could just make her life easier and "let me go home to do what I want or put me to sleep."

## 2023-11-21 NOTE — ED Notes (Signed)
 Pt belongings in locker 42 because 43 lock is broken

## 2023-11-21 NOTE — Consult Note (Signed)
 [facility] Psychiatric Consult Initial  Patient Name: .Lauren Poole  MRN: [id number]  DOB: Nov 23, 1990  Consult Order details:  Orders (From admission, onward)     Start     Ordered   11/21/23 1145  CONSULT TO CALL ACT TEAM       Ordering Provider: Pete Pelt, PA  Provider:  (Not yet assigned)  Question:  Reason for Consult?  Answer:  SI   11/21/23 1144             Mode of Visit: In person    Psychiatry Consult Evaluation  Service Date: November 21, 2023 LOS:  LOS: 0 days  Chief Complaint "SI with plans to drink bleach"  Primary Psychiatric Diagnoses  MDD w/ psychosis 2.   Anxiety  3.   Suicidal thoughts  Assessment  Lauren Poole is a 33 y.o. female admitted: Presented to the ED on 11/21/2023 10:08 AM for suicidal ideations with a plan to drink bleach. She carries the psychiatric diagnoses of Major depressive disorder and anxiety and has a past medical history of  none.   Her current presentation of suicidal thoughts, depression, guardedness, hopelessness is most consistent with depression. She meets criteria for inpatient psychiatric admission due to her untreated psychiatric diagnosis.  Current outpatient psychotropic medications include Zyprexa, trazodone. She was not compliant with medications prior to admission as evidenced by patient report. On initial examination, patient is guarded, and minimizing what caused her to be admitted to hospital. Please see plan below for detailed recommendations.    Diagnoses:  Active Hospital problems: Principal Problem:   MDD (major depressive disorder), recurrent, severe, with psychosis (HCC) Active Problems:   Generalized anxiety disorder    Plan   ## Psychiatric Medication Recommendations:  Start Zyprexa 5 mg PO BID for psychosis Start Hydroxyzine 25 mg PO for anxiety    ## Medical Decision Making Capacity: Not specifically addressed in this encounter   ## Further Work-up:  -- No further work up needed at  this time  EKG, While pt on Qtc prolonging medications, please monitor & replete K+ to 4 and Mg2+ to 2, or UDS -- will get an updated EKG on 10/28/23 Qtc was 385 -- Pertinent labwork reviewed earlier this admission includes: UDS, CBC, CMP, EKG     ## Disposition:-- We recommend inpatient psychiatric hospitalization. Patient has been involuntarily committed on 11/21/23.    ## Behavioral / Environmental: -To minimize splitting of staff, assign one staff person to communicate all information from the team when feasible. or Utilize compassion and acknowledge the patient's experiences while setting clear and realistic expectations for care. [facility] is reviewing.                 ## Safety and Observation Level:  - Based on my clinical evaluation, I estimate the patient to be at low risk of self harm in the current setting. - At this time, we recommend 1:1. This decision is based on my review of the chart including patient's history and current presentation, interview of the patient, mental status examination, and consideration of suicide risk including evaluating suicidal ideation, plan, intent, suicidal or self-harm behaviors, risk factors, and protective factors. This judgment is based on our ability to directly address suicide risk, implement suicide prevention strategies, and develop a safety plan while the patient is in the clinical setting. Please contact our team if there is a concern that risk level has changed.   CSSR Risk Category:C-SSRS RISK CATEGORY: High Risk   Suicide Risk  Assessment: Patient has following modifiable risk factors for suicide: active suicidal ideation, untreated depression, recklessness, and medication noncompliance, which we are addressing by recommending inpatient psychiatric admission. Patient has following non-modifiable or demographic risk factors for suicide: unknown. Patient has the following protective factors against suicide: Supportive family   Thank you for this  consult request. Recommendations have been communicated to the primary team.  We will recommend inpatient psychiatric admission at this time.     Lauren Poole, PMHNP       History of Present Illness  Relevant Aspects of Hospital ED Course:  Admitted on 11/21/2023 for suicidal ideations with a plan to drink bleach  Patient Report:  Lauren Poole, 33 y.o., female patient seen face to face by this provider, consulted with Dr. Woodroe Mode; and chart reviewed on 11/21/23.  On evaluation Lauren Poole during evaluation patient appeared to be very guarded, minimizing her situation as to why she is her in the the emergency department, stating "yeah I wanted to drink bleach, going to drink bleach now, no, but, still feeling tired of life, yes."  When this provider asked her what was some of the stressors that were making her want to drink bleach, she stated " does it really matter, environmental stressors."  This provider asked her what she meant by that, patient says "it does not matter."Patient stated she lives alone, this provider asked her if she had a job, she stated " no I was fired from my job."  This provider asked her if that was one of her stressors, patient said sarcastically " yeah, you need money to live."  Patient then states, just let me go back home, I will be safe.  Patient states she has no psychiatric diagnoses, but states that she was admitted to an inpatient psychiatric facility about a month ago, but states she does not take the medication, as she feels she does not need it.  She denies using any illicit substances, who UDS is negative BAL less than 10.  She currently denies SI/HI/AVH, but passively states "I am tired of being in this world, life is not draining the energy out of me."  During evaluation Lauren Poole is sitting on the edge of her bed and appears to be in no acute distress. She is alert, oriented x 3, calm, cooperative and attentive. Her mood is  guarded with congruent/ flat bizarre affect. She has normal speech, and behavior.  Objectively there is evidence of psychosis/mania or delusional thinking.  She denies self-harm/homicidal ideation, psychosis, and paranoia.  Patient endorses suicidal ideations, passively.     Psych ROS:  Depression: Endorses Anxiety: Denies Mania (lifetime and current): Denies Psychosis: (lifetime and current): Positive   Collateral information:  Patient did not give permission for this provider to call anyone on her emergency contact list, she states " if you are not going to let me leave, you do not need to call them, they are about tired of me anyway."     Review of Systems  Psychiatric/Behavioral:  Positive for depression and suicidal ideas.      Psychiatric and Social History  Psychiatric History:  Information collected from patient chart   Prev Dx/Sx: MDD Current Psych Provider: None Home Meds (current): None Previous Med Trials: Unknown Therapy: None   Prior Psych Hospitalization: Unknown Prior Self Harm: Yes Prior Violence: Unknown   Family Psych History: Unknown Family Hx suicide: Unknown   Social History:  Developmental Hx: Deferred Educational Hx: Unknown Occupational Hx: Unemployed Legal Hx: Denies  Living Situation: Homeless Spiritual Hx: Unknown Access to weapons/lethal means: Denies   Substance History Patient denies any substance abuse or alcohol abuse history per chart review patient has had an issue with alcohol in the past and has had a DUI   Rehab hx: Denies  Exam Findings  Physical Exam:  Vital Signs:  Temp:  [98.3 F (36.8 C)] 98.3 F (36.8 C) (04/06 1135) Pulse Rate:  [74] 74 (04/06 1135) Resp:  [14] 14 (04/06 1135) BP: (128)/(115) 128/115 (04/06 1135) SpO2:  [95 %] 95 % (04/06 1135) Weight:  [113.4 kg] 113.4 kg (04/06 1016) Blood pressure (!) 128/115, pulse 74, temperature 98.3 F (36.8 C), temperature source Oral, resp. rate 14, height 5\' 4"  (1.626  m), weight 113.4 kg, SpO2 95%. Body mass index is 42.91 kg/m.  Physical Exam Neurological:     Mental Status: She is alert.  Psychiatric:        Attention and Perception: Attention normal.        Mood and Affect: Mood is depressed. Affect is flat.        Speech: Speech normal.        Behavior: Behavior is cooperative.        Thought Content: Thought content includes suicidal ideation. Thought content includes suicidal plan.        Cognition and Memory: Memory normal.        Judgment: Judgment is impulsive.     Mental Status Exam: General Appearance: Disheveled  Orientation:  Full (Time, Place, and Person)  Memory:  Immediate;   Fair Remote;   Fair  Concentration:  Concentration: Fair and Attention Span: Fair  Recall:  Fair  Attention  Poor  Eye Contact:  Minimal  Speech:  Clear and Coherent  Language:  Fair  Volume:  Decreased  Mood: Bizarre, guarded  Affect:  Flat and Restricted  Thought Process:   Irrelevant  Thought Content:  Illogical   Suicidal Thoughts:  Yes.    Homicidal Thoughts:  No  Judgement:  Poor  Insight:  Lacking  Psychomotor Activity:  Normal  Akathisia:  No  Fund of Knowledge:  Poor       Assets:  Manufacturing systems engineer Social Support  Cognition:  WNL and Impaired,  Mild  ADL's:  Intact  AIMS (if indicated):        Other History   These have been pulled in through the EMR, reviewed, and updated if appropriate.  Family History:  The patient's family history includes Schizophrenia in her sister.  Medical History: Past Medical History:  Diagnosis Date   Chlamydia    Gonorrhea     Surgical History: Past Surgical History:  Procedure Laterality Date   NO PAST SURGERIES       Medications:   Current Facility-Administered Medications:    haloperidol lactate (HALDOL) injection 5 mg, 5 mg, Intramuscular, Q6H PRN, Motley-Mangrum, Maeven Mcdougall A, PMHNP   hydrOXYzine (ATARAX) tablet 25 mg, 25 mg, Oral, TID PRN, Motley-Mangrum, Zaeem Kandel A, PMHNP    LORazepam (ATIVAN) injection 2 mg, 2 mg, Intramuscular, Q6H PRN, Motley-Mangrum, Siobahn Worsley A, PMHNP   OLANZapine zydis (ZYPREXA) disintegrating tablet 5 mg, 5 mg, Oral, BID, Motley-Mangrum, Keyna Blizard A, PMHNP, 5 mg at 11/21/23 1227  Current Outpatient Medications:    hydrOXYzine (ATARAX) 25 MG tablet, Take 1 tablet (25 mg total) by mouth 3 (three) times daily as needed for anxiety., Disp: 75 tablet, Rfl: 0   nicotine polacrilex (NICORETTE) 2 MG gum, Take 1 each (2 mg total) by mouth as needed. (  May buy from over the counter): For smoking cessation., Disp: , Rfl:    OLANZapine zydis (ZYPREXA) 10 MG disintegrating tablet, Take 1 tablet (10 mg total) by mouth 2 (two) times daily. For mood control, Disp: 60 tablet, Rfl: 0   traZODone (DESYREL) 50 MG tablet, Take 1 tablet (50 mg total) by mouth at bedtime. For sleep., Disp: 30 tablet, Rfl: 0   Vitamin D, Ergocalciferol, (DRISDOL) 1.25 MG (50000 UNIT) CAPS capsule, Take 1 capsule (50,000 Units total) by mouth every 7 (seven) days. For bone health, Disp: 5 capsule, Rfl: 0  Allergies: No Known Allergies  Lashonta Pilling MOTLEY-MANGRUM, PMHNP

## 2023-11-21 NOTE — ED Notes (Signed)
Patient in room watching TV.

## 2023-11-21 NOTE — ED Notes (Signed)
 Pt states,"I am already dead." "I am walking around as a ghost already."

## 2023-11-21 NOTE — ED Notes (Signed)
 Patient to room 43  per charge. Patient is uncooperative and  will not be redirected.  NP talking at present.

## 2023-11-21 NOTE — ED Provider Notes (Signed)
 [place] EMERGENCY DEPARTMENT AT [facility] Provider Note   CSN: [id number] Arrival date & time: 11/21/23  1005     History  Chief Complaint  Patient presents with   Psychiatric Evaluation    Lauren Poole is a 33 y.o. female, history of MDD, bipolar disorder, psychosis, who presents to the ED secondary to suicidal ideation.  She states she called police, to come take custody of her dog, as the dog almost bit her the other day and stated she plans on drinking bleach.  [employer] was able to convince her to come to the ER.  Patient states that she is going to drink bleach, "I am tired of feeling drained.  They are draining my energy." Reports hx of SI. Denies HI/AVH.   Home Medications Prior to Admission medications   Medication Sig Start Date End Date Taking? Authorizing Provider  hydrOXYzine (ATARAX) 25 MG tablet Take 1 tablet (25 mg total) by mouth 3 (three) times daily as needed for anxiety. 11/04/23   Armandina Stammer I, NP  nicotine polacrilex (NICORETTE) 2 MG gum Take 1 each (2 mg total) by mouth as needed. (May buy from over the counter): For smoking cessation. 11/04/23   Armandina Stammer I, NP  OLANZapine zydis (ZYPREXA) 10 MG disintegrating tablet Take 1 tablet (10 mg total) by mouth 2 (two) times daily. For mood control 11/04/23   Armandina Stammer I, NP  traZODone (DESYREL) 50 MG tablet Take 1 tablet (50 mg total) by mouth at bedtime. For sleep. 11/04/23   Armandina Stammer I, NP  Vitamin D, Ergocalciferol, (DRISDOL) 1.25 MG (50000 UNIT) CAPS capsule Take 1 capsule (50,000 Units total) by mouth every 7 (seven) days. For bone health 11/08/23   Armandina Stammer I, NP      Allergies    Patient has no known allergies.    Review of Systems   Review of Systems  Psychiatric/Behavioral:  Positive for suicidal ideas. Negative for hallucinations.     Physical Exam Updated Vital Signs BP (!) 128/115 (BP Location: Left Arm)   Pulse 74   Temp 98.3 F (36.8 C) (Oral)   Resp 14   Ht 5\' 4"   (1.626 m)   Wt 113.4 kg   SpO2 95%   BMI 42.91 kg/m  Physical Exam Vitals and nursing note reviewed.  Constitutional:      General: She is not in acute distress.    Appearance: She is well-developed.  HENT:     Head: Normocephalic and atraumatic.  Eyes:     Conjunctiva/sclera: Conjunctivae normal.  Cardiovascular:     Rate and Rhythm: Normal rate and regular rhythm.     Heart sounds: No murmur heard. Pulmonary:     Effort: Pulmonary effort is normal. No respiratory distress.     Breath sounds: Normal breath sounds.  Abdominal:     Palpations: Abdomen is soft.     Tenderness: There is no abdominal tenderness.  Musculoskeletal:        General: No swelling.     Cervical back: Neck supple.  Skin:    General: Skin is warm and dry.     Capillary Refill: Capillary refill takes less than 2 seconds.  Neurological:     Mental Status: She is alert.  Psychiatric:     Comments: Flat affect, endorses SI. Denies HI.      ED Results / Procedures / Treatments   Labs (all labs ordered are listed, but only abnormal results are displayed) Labs Reviewed  COMPREHENSIVE METABOLIC  PANEL WITH GFR - Abnormal; Notable for the following components:      Result Value   CO2 21 (*)    All other components within normal limits  ACETAMINOPHEN LEVEL - Abnormal; Notable for the following components:   Acetaminophen (Tylenol), Serum <10 (*)    All other components within normal limits  SALICYLATE LEVEL - Abnormal; Notable for the following components:   Salicylate Lvl <7.0 (*)    All other components within normal limits  URINALYSIS, ROUTINE W REFLEX MICROSCOPIC - Abnormal; Notable for the following components:   APPearance CLOUDY (*)    Hgb urine dipstick Janis Sol (*)    Bacteria, UA RARE (*)    All other components within normal limits  ETHANOL  RAPID URINE DRUG SCREEN, HOSP PERFORMED  CBC WITH DIFFERENTIAL/PLATELET  HCG, SERUM, QUALITATIVE    EKG None  Radiology No results  found.  Procedures Procedures    Medications Ordered in ED Medications - No data to display  ED Course/ Medical Decision Making/ A&P                                 Medical Decision Making Is a 6 patient is a 33 year old female, history of MDD, bipolar disorder, and psychosis, here for suicidal ideation with plans to drink bleach.  She has been suicidal prior, and endorses that she has a plan to kill herself, by drinking bleach.  She appears to be almost disconnected with reality.  Will obtain blood work, for further evaluation.  She adamantly states that she has not drank any bleach, but plans to, because she is tired of living.  We will have psychiatry evaluate her, and obtain blood work for further evaluation.  Follow-up unfortunately she attempted to leave, that she was IVC, given her suicidal ideation with plan, and concerning behavior  Amount and/or Complexity of Data Reviewed Labs: ordered.    Details: Unremarkable Discussion of management or test interpretation with external provider(s): Patient is medically clear, she should be evaluated by psychiatry for further evaluation.  She has been off her meds for 3 days, and states she wants to kill herself, with plan.  Medically cleared.  Psychiatry to further evaluate    Final Clinical Impression(s) / ED Diagnoses Final diagnoses:  Suicidal ideation    Rx / DC Orders ED Discharge Orders     None         Sharonne Ricketts, Harley Alto, PA 11/21/23 1147    Pricilla Loveless, MD 11/21/23 1444

## 2023-11-21 NOTE — ED Notes (Signed)
 Pt sitting and watching TV in room.

## 2023-11-21 NOTE — ED Triage Notes (Signed)
 Pt reports SI and was voluntarily bib [employer]. Pt says she has a plan to drink bleach. Was worried [employer] would take her bleach.

## 2023-11-21 NOTE — ED Notes (Signed)
 Patient given her dinner tray and apple juice.

## 2023-11-22 ENCOUNTER — Other Ambulatory Visit: Payer: Self-pay

## 2023-11-22 ENCOUNTER — Encounter (HOSPITAL_COMMUNITY): Payer: Self-pay | Admitting: Nurse Practitioner

## 2023-11-22 ENCOUNTER — Inpatient Hospital Stay (HOSPITAL_COMMUNITY)
Admission: AD | Admit: 2023-11-22 | Discharge: 2023-11-30 | DRG: 885 | Disposition: A | Discharge status: Home / Self Care | Source: Other Acute Inpatient Hospital | Attending: Psychiatry | Admitting: Psychiatry

## 2023-11-22 DIAGNOSIS — F4310 Post-traumatic stress disorder, unspecified: Secondary | ICD-10-CM | POA: Diagnosis present

## 2023-11-22 DIAGNOSIS — F323 Major depressive disorder, single episode, severe with psychotic features: Principal | ICD-10-CM | POA: Diagnosis present

## 2023-11-22 DIAGNOSIS — Z6841 Body Mass Index (BMI) 40.0 and over, adult: Secondary | ICD-10-CM

## 2023-11-22 DIAGNOSIS — R45851 Suicidal ideations: Secondary | ICD-10-CM | POA: Diagnosis present

## 2023-11-22 DIAGNOSIS — Z79899 Other long term (current) drug therapy: Secondary | ICD-10-CM

## 2023-11-22 DIAGNOSIS — Z560 Unemployment, unspecified: Secondary | ICD-10-CM

## 2023-11-22 DIAGNOSIS — Z62810 Personal history of physical and sexual abuse in childhood: Secondary | ICD-10-CM

## 2023-11-22 DIAGNOSIS — E663 Overweight: Secondary | ICD-10-CM | POA: Diagnosis present

## 2023-11-22 DIAGNOSIS — Z818 Family history of other mental and behavioral disorders: Secondary | ICD-10-CM | POA: Diagnosis not present

## 2023-11-22 DIAGNOSIS — F17210 Nicotine dependence, cigarettes, uncomplicated: Secondary | ICD-10-CM | POA: Diagnosis present

## 2023-11-22 MED ORDER — HALOPERIDOL LACTATE 5 MG/ML IJ SOLN: 5.0000 mg | Freq: Three times a day (TID) | INTRAMUSCULAR | Status: DC | PRN

## 2023-11-22 MED ORDER — LORAZEPAM 2 MG/ML IJ SOLN: 2.0000 mg | Freq: Three times a day (TID) | INTRAMUSCULAR | Status: DC | PRN

## 2023-11-22 MED ORDER — DIPHENHYDRAMINE HCL 25 MG PO CAPS: 50.0000 mg | ORAL_CAPSULE | Freq: Three times a day (TID) | ORAL | Status: DC | PRN

## 2023-11-22 MED ORDER — NICOTINE 14 MG/24HR TD PT24
14.0000 mg | MEDICATED_PATCH | Freq: Every day | TRANSDERMAL | Status: DC
  Administered 2023-11-23 – 2023-11-25 (×3): 14 mg via TRANSDERMAL
  Filled 2023-11-22 (×12): qty 1

## 2023-11-22 MED ORDER — DIPHENHYDRAMINE HCL 50 MG/ML IJ SOLN: 50.0000 mg | Freq: Three times a day (TID) | INTRAMUSCULAR | Status: DC | PRN

## 2023-11-22 MED ORDER — HYDROXYZINE HCL 25 MG PO TABS
25.0000 mg | ORAL_TABLET | Freq: Three times a day (TID) | ORAL | Status: DC | PRN
Start: 2023-11-22 — End: 2023-11-30
  Administered 2023-11-26 – 2023-11-29 (×5): 25 mg via ORAL
  Filled 2023-11-22 (×4): qty 1
  Filled 2023-11-22: qty 15
  Filled 2023-11-22 (×5): qty 1

## 2023-11-22 MED ORDER — ACETAMINOPHEN 325 MG PO TABS
650.0000 mg | ORAL_TABLET | Freq: Four times a day (QID) | ORAL | Status: DC | PRN
  Administered 2023-11-22 – 2023-11-26 (×2): 650 mg via ORAL
  Filled 2023-11-22 (×2): qty 2

## 2023-11-22 MED ORDER — HALOPERIDOL LACTATE 5 MG/ML IJ SOLN: 10.0000 mg | Freq: Three times a day (TID) | INTRAMUSCULAR | Status: DC | PRN

## 2023-11-22 MED ORDER — HALOPERIDOL 5 MG PO TABS: 5.0000 mg | ORAL_TABLET | Freq: Three times a day (TID) | ORAL | Status: DC | PRN

## 2023-11-22 MED ORDER — OLANZAPINE 5 MG PO TBDP
5.0000 mg | ORAL_TABLET | Freq: Two times a day (BID) | ORAL | Status: DC
Start: 2023-11-22 — End: 2023-11-23
  Administered 2023-11-22 – 2023-11-23 (×2): 5 mg via ORAL
  Filled 2023-11-22 (×8): qty 1

## 2023-11-22 MED ORDER — WHITE PETROLATUM EX OINT
TOPICAL_OINTMENT | CUTANEOUS | Status: AC
  Filled 2023-11-22: qty 5

## 2023-11-22 MED ORDER — ALUM & MAG HYDROXIDE-SIMETH 200-200-20 MG/5ML PO SUSP: 30.0000 mL | ORAL | Status: DC | PRN

## 2023-11-22 MED ORDER — TRAZODONE HCL 50 MG PO TABS
50.0000 mg | ORAL_TABLET | Freq: Every evening | ORAL | Status: DC | PRN
  Administered 2023-11-22 – 2023-11-29 (×6): 50 mg via ORAL
  Filled 2023-11-22 (×6): qty 1
  Filled 2023-11-22: qty 10

## 2023-11-22 NOTE — Group Note (Signed)
 Date:  11/22/2023 Time:  8:57 PM  Group Topic/Focus:  Wrap-Up Group:   The focus of this group is to help patients review their daily goal of treatment and discuss progress on daily workbooks.    Participation Level:  Did Not Attend   Scot Dock 11/22/2023, 8:57 PM

## 2023-11-22 NOTE — Progress Notes (Signed)
   11/22/23 2145  Psych Admission Type (Psych Patients Only)  Admission Status Involuntary  Psychosocial Assessment  Patient Complaints Anxiety;Suspiciousness  Eye Contact Glaring  Facial Expression Anxious  Affect Preoccupied  Speech Logical/coherent  Interaction Assertive  Motor Activity Slow  Appearance/Hygiene In scrubs  Behavior Characteristics Guarded  Mood Anxious;Labile  Aggressive Behavior  Effect No apparent injury  Thought Process  Coherency WDL  Content WDL  Delusions WDL  Perception WDL  Hallucination None reported or observed  Judgment Poor  Confusion None  Danger to Self  Current suicidal ideation? Denies  Danger to Others  Danger to Others None reported or observed

## 2023-11-22 NOTE — Group Note (Signed)
 Date:  11/22/2023 Time:  8:33 PM  Group Topic/Focus:  Wrap-Up Group:   The focus of this group is to help patients review their daily goal of treatment and discuss progress on daily workbooks.    Participation Level:  Active  Participation Quality:  Appropriate  Affect:  Appropriate  Cognitive:  Appropriate  Insight: Appropriate  Engagement in Group:  Engaged  Modes of Intervention:  Discussion  Additional Comments:   Today is pts first day on the unit. Pt states that she is frustrated with being here and is ready to speak with her doctor in the morning. Pt denied everything.   Lauren Poole 11/22/2023, 8:33 PM

## 2023-11-22 NOTE — Plan of Care (Signed)
  Problem: Activity: Goal: Interest or engagement in activities will improve Outcome: Progressing Goal: Sleeping patterns will improve Outcome: Progressing   Problem: Safety: Goal: Periods of time without injury will increase Outcome: Progressing

## 2023-11-22 NOTE — ED Notes (Signed)
 Called report to [facility] to [place], called for transport

## 2023-11-22 NOTE — Tx Team (Signed)
 Initial Treatment Plan 11/22/2023 3:45 PM Lauren Poole ZOX:[id number]    PATIENT STRESSORS: Financial difficulties   Medication change or noncompliance     PATIENT STRENGTHS: General fund of knowledge  Supportive family/friends    PATIENT IDENTIFIED PROBLEMS: MDD                      DISCHARGE CRITERIA:  Improved stabilization in mood, thinking, and/or behavior Need for constant or close observation no longer present Verbal commitment to aftercare and medication compliance  PRELIMINARY DISCHARGE PLAN: Outpatient therapy Return to previous living arrangement  PATIENT/FAMILY INVOLVEMENT: This treatment plan has been presented to and reviewed with the patient, Lauren Poole.  The patient and family have been given the opportunity to ask questions and make suggestions.  Laurance Flatten, RN 11/22/2023, 3:45 PM

## 2023-11-22 NOTE — Progress Notes (Signed)
   11/22/23 1500  Psych Admission Type (Psych Patients Only)  Admission Status Involuntary  Psychosocial Assessment  Patient Complaints Depression;Self-harm thoughts  Eye Contact Glaring  Facial Expression Anxious  Affect Irritable  Speech Logical/coherent  Interaction Assertive  Motor Activity Slow  Appearance/Hygiene In scrubs  Behavior Characteristics Agitated  Mood Anxious;Irritable  Thought Process  Coherency WDL  Content WDL  Delusions None reported or observed  Perception WDL  Hallucination None reported or observed  Judgment Poor  Confusion None  Danger to Self  Current suicidal ideation? Denies  Agreement Not to Harm Self Yes  Description of Agreement verbal  Danger to Others  Danger to Others None reported or observed

## 2023-11-22 NOTE — Progress Notes (Signed)
 Pt has been accepted to [facility] on 11/22/2023 Bed assignment: 503-1  Pt meets inpatient criteria per: Alona Bene, PMHNP   Attending Physician will be: Dr. Sarita Bottom, MD   Report can be called to: -Adult unit: [phone]  Pt can arrive after discharges   Care Team Notified: [facility] Rona Ravens RN, Dahlia Byes NP April Wilson paramedic     [place] Arvis Miguez LCSW-A   11/22/2023 10:24 AM

## 2023-11-22 NOTE — Progress Notes (Signed)
 Upon admission, pt is irritable and states "I have no clue anymore" when asked if she knows why she is here. Pt denies alcohol or drug use and states her last suicide attempt was prior to the last time she was admitted here. Pt denies SI currently and reports that she expects to be evicted from her apartment soon.

## 2023-11-22 NOTE — ED Provider Notes (Addendum)
 Emergency Medicine Observation Re-evaluation Note  Lauren Poole is a 33 y.o. female, seen on rounds today.  Pt initially presented to the ED for complaints of Psychiatric Evaluation Currently, the patient is without issues.  Physical Exam  BP 124/87 (BP Location: Right Arm)   Pulse 80   Temp 98.2 F (36.8 C) (Oral)   Resp 18   Ht 5\' 4"  (1.626 m)   Wt 113.4 kg   SpO2 100%   BMI 42.91 kg/m  Physical Exam General: awake   ED Course / MDM  EKG:EKG Interpretation Date/Time:  Sunday November 21 2023 15:35:10 EDT Ventricular Rate:  80 PR Interval:  134 QRS Duration:  78 QT Interval:  358 QTC Calculation: 412 R Axis:   35  Text Interpretation: Normal sinus rhythm with sinus arrhythmia Low voltage QRS Nonspecific T wave abnormality Confirmed by Palumbo, April ([id number]) on 11/22/2023 7:44:04 AM  I have reviewed the labs performed to date as well as medications administered while in observation.  Recent changes in the last 24 hours include nothing.  Plan  Current plan is for psych recommending inpt treatment, meds started.  Patient admitted to [facility] with Dr. Zigmund Gottron, Carol Loftin, DO 11/22/23 0801    Lockie Mola, Adrinne Sze, DO 11/22/23 1501

## 2023-11-22 NOTE — ED Notes (Signed)
 Pt came back to the window asking when she could leave and I let her know she is under involuntary committment. She said no she wasn't that she was voluntary and I told her that I have the paperwork for invol.  I advised her that they took out papers because she was trying/threatening to harm herself.  She shook her head and walked off and came back and said this is why she doesn't like to come here.

## 2023-11-22 NOTE — ED Notes (Signed)
Pt laying in bed watching tv  

## 2023-11-22 NOTE — ED Notes (Signed)
Pt up watching tv.

## 2023-11-22 NOTE — ED Notes (Signed)
 Pt came to window asking what the date is.  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  I told her I have no information on her leaving as of now.

## 2023-11-22 NOTE — ED Notes (Signed)
 Pt laying in bed sleep

## 2023-11-23 DIAGNOSIS — F323 Major depressive disorder, single episode, severe with psychotic features: Secondary | ICD-10-CM | POA: Diagnosis not present

## 2023-11-23 MED ORDER — ESCITALOPRAM OXALATE 5 MG PO TABS
5.0000 mg | ORAL_TABLET | Freq: Every day | ORAL | Status: DC
  Administered 2023-11-23 – 2023-11-24 (×2): 5 mg via ORAL
  Filled 2023-11-23 (×3): qty 1

## 2023-11-23 NOTE — Progress Notes (Signed)
   11/23/23 0900  Psych Admission Type (Psych Patients Only)  Admission Status Involuntary  Psychosocial Assessment  Patient Complaints Depression  Eye Contact Fair  Facial Expression Anxious  Affect Preoccupied  Speech Logical/coherent  Interaction Assertive  Motor Activity Slow  Appearance/Hygiene In scrubs  Behavior Characteristics Guarded  Mood Anxious  Thought Process  Coherency WDL  Content WDL  Delusions WDL  Perception WDL  Hallucination None reported or observed  Judgment Poor  Confusion None  Danger to Self  Current suicidal ideation? Denies  Danger to Others  Danger to Others None reported or observed   Dar Note: Patient presents with a flat affect and depressed mood.  Denies suicidal thoughts, auditory and visual hallucinations.  Medications given as prescribed.  Attended group and participated.  Routine safety checks maintained.  Patient is safe on the unit.

## 2023-11-23 NOTE — Plan of Care (Signed)
   Problem: Education: Goal: Knowledge of [facility] Education information/materials will improve Outcome: Progressing Goal: Emotional status will improve Outcome: Progressing Goal: Mental status will improve Outcome: Progressing

## 2023-11-23 NOTE — Group Note (Signed)
 Date:  11/23/2023 Time:  8:34 PM  Group Topic/Focus:  Wrap-Up Group:   The focus of this group is to help patients review their daily goal of treatment and discuss progress on daily workbooks.    Participation Level:  Did Not Attend  Scot Dock 11/23/2023, 8:34 PM

## 2023-11-23 NOTE — Plan of Care (Signed)
   Problem: Education: Goal: Emotional status will improve Outcome: Not Progressing Goal: Mental status will improve Outcome: Not Progressing
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
## 2023-11-23 NOTE — Progress Notes (Addendum)
 Conversation with patient:  CSW gave patient a list of shelter resources.   Collateral contact - [name] (mom) [phone]  CSW attempted to call mom.  The message said, "The mailbox is full and can't accept new messages at this time."   Collateral contact - [facility] - [phone]  They don't accept patients that don't have Medicaid (patient doesn't have insurance).    Jermia Rigsby, LCSWA 11/23/2023

## 2023-11-23 NOTE — BHH Group Notes (Signed)
 Adult Psychoeducational Group Note  Date:  11/23/2023 Time:  3:50 PM  Group Topic/Focus:  Goals Group:   The focus of this group is to help patients establish daily goals to achieve during treatment and discuss how the patient can incorporate goal setting into their daily lives to aide in recovery.  Participation Level:  Active  Participation Quality:  Appropriate  Affect:  Appropriate  Cognitive:  Appropriate  Insight: Appropriate  Engagement in Group:  Engaged  Modes of Intervention:  Orientation  Additional Comments:  Pt goal for today is to focus on herself and speak with social worker to get started on discharge plan  Dellia Nims 11/23/2023, 3:50 PM

## 2023-11-23 NOTE — BHH Suicide Risk Assessment (Signed)
 [facility] Admission Suicide Risk Assessment   Nursing information obtained from:  Patient Demographic factors:  Living alone, Low socioeconomic status Current Mental Status:  NA Loss Factors:  Financial problems / change in socioeconomic status Historical Factors:  Prior suicide attempts, Impulsivity Risk Reduction Factors:  Religious beliefs about death  Total Time spent with patient: 45 minutes Principal Problem: MDD (major depressive disorder), single episode, severe with psychosis (HCC) Diagnosis:  Principal Problem:   MDD (major depressive disorder), single episode, severe with psychosis (HCC)  Subjective Data:  33 year old African-American female with history of childhood trauma.  This is her second admission within a month on account of suicidal behavior and suicidal ideation.  Continued Clinical Symptoms:  Alcohol Use Disorder Identification Test Final Score (AUDIT): 0 The "Alcohol Use Disorders Identification Test", Guidelines for Use in Primary Care, Second Edition.  World Science writer [facility]). Score between 0-7:  no or low risk or alcohol related problems. Score between 8-15:  moderate risk of alcohol related problems. Score between 16-19:  high risk of alcohol related problems. Score 20 or above:  warrants further diagnostic evaluation for alcohol dependence and treatment.   CLINICAL FACTORS:   Previous Psychiatric Diagnoses and Treatments   Musculoskeletal: Strength & Muscle Tone: within normal limits Gait & Station: normal Patient leans: N/A  Psychiatric Specialty Exam:  Presentation  General Appearance:  Overweight, casually dressed, good rapport.  Appropriate behavior.  Eye Contact: Good  Speech: Spontaneous.  Soft spoken.  Mood and Affect  Mood: Worried but not pervasively depressed.  Affect: Appropriate; Congruent   Thought Process  Thought Processes: Coherent; Goal Directed; Linear  Descriptions of Associations:Intact  Orientation:Full  (Time, Place and Person)  Thought Content: Negative ruminations about her psychosocial situation.  Feelings of hopelessness and worthlessness.  Patient feels trapped and sees suicide as a way out.  No violent thoughts towards others.  No delusional preoccupation.  Hallucinations: No hallucination in any modality.  Sensorium  Memory: Immediate Good; Recent Good; Remote Good  Judgment: Fair  Insight: Good.   Executive Functions  Concentration: Good  Attention Span: Good  Recall: Good  Fund of Knowledge: Good.  Language: Good   Psychomotor Activity  Normal psychomotor activity.   Physical Exam: Physical Exam ROS Blood pressure 122/81, pulse (!) 56, temperature 97.8 F (36.6 C), temperature source Oral, resp. rate 15, height 5\' 4"  (1.626 m), weight 111.6 kg, SpO2 100%. Body mass index is 42.23 kg/m.   COGNITIVE FEATURES THAT CONTRIBUTE TO RISK:  Closed-mindedness    SUICIDE RISK:   Moderate:  Frequent suicidal ideation with limited intensity, and duration, some specificity in terms of plans, no associated intent, good self-control, limited dysphoria/symptomatology, some risk factors present, and identifiable protective factors, including available and accessible social support.  PLAN OF CARE:  Patient will be placed on every 15 minute checks for safety.  We will initiate an SSRI.  I certify that inpatient services furnished can reasonably be expected to improve the patient's condition.   Georgiann Cocker, MD 11/23/2023, 2:58 PM

## 2023-11-24 ENCOUNTER — Encounter (HOSPITAL_COMMUNITY): Payer: Self-pay

## 2023-11-24 DIAGNOSIS — F323 Major depressive disorder, single episode, severe with psychotic features: Secondary | ICD-10-CM | POA: Diagnosis not present

## 2023-11-24 MED ORDER — WHITE PETROLATUM EX OINT
TOPICAL_OINTMENT | CUTANEOUS | Status: AC
  Administered 2023-11-24: 1
  Filled 2023-11-24: qty 5

## 2023-11-24 NOTE — Plan of Care (Signed)

## 2023-11-24 NOTE — BHH Suicide Risk Assessment (Signed)
 B[facility] INPATIENT:  Family/Significant Other Suicide Prevention Education  Suicide Prevention Education:  Education Completed; Lauren Poole "Lauren Poole" (boyfriend) Lauren Poole [phone],  (name of family member/significant other) has been identified by the patient as the family member/significant other with whom the patient will be residing, and identified as the person(s) who will aid the patient in the event of a mental health crisis (suicidal ideations/suicide attempt).  With written consent from the patient, the family member/significant other has been provided the following suicide prevention education, prior to the and/or following the discharge of the patient.  When asked what is his relationship to patient, he said he is her boyfriend and they have been dating for 7 years.  He said that he will pick up patient at discharge.  He wasn't sure if she will return to her apartment of his (both homes are in [place]).  He said that there are no guns in either home.  He said that he will secure medications and sharp objects (such as knives or scissors).  He said that he doesn't have any safety concerns about patient returning home, however he wanted her to get properly diagnosed.    Boyfriend said that after last discharge, patient stayed with him for a week.  He said that patient came to the hospital because she didn't take medications, and experienced symptoms:  she felt that random people were following her and wanted to kill her, so she would confront them.  In frustration, she wanted to kill herself.  He said that patient is experiencing the following stressors:  no job and no income.  The suicide prevention education provided includes the following: Suicide risk factors Suicide prevention and interventions National Suicide Hotline telephone number [facility] assessment telephone number [facility] Emergency Assistance 911 [facility] and/or Residential Mobile Crisis Unit telephone  number  Request made of family/significant other to: Remove weapons (e.g., guns, rifles, knives), all items previously/currently identified as safety concern.   Remove drugs/medications (over-the-counter, prescriptions, illicit drugs), all items previously/currently identified as a safety concern.  The family member/significant other verbalizes understanding of the suicide prevention education information provided.  The family member/significant other agrees to remove the items of safety concern listed above.  Lauren Poole O Lauren Poole, LCSWA 11/24/2023, 6:21 PM

## 2023-11-24 NOTE — Progress Notes (Signed)
 Recreation Therapy Notes  INPATIENT RECREATION THERAPY ASSESSMENT  Patient Details Name: Lauren Poole MRN: [id number] DOB: [birth year] Today's Date: 11/24/2023       Information Obtained From: Patient  Able to Participate in Assessment/Interview: Yes  Patient Presentation: Alert  Reason for Admission (Per Patient): Other (Comments) (Pt stated she told the officer she was going to drink bleach.)  Patient Stressors: Other (Comment) ("life")  Coping Skills:   Self-Injury, Journal, Sports, Exercise, Meditate, Deep Breathing, Music, Talk, Art, Prayer, Avoidance, Read, Dance, Hot Bath/Shower  Leisure Interests (2+):  Individual - Reading, Ashby Dawes - Other (Comment), Community - Other (Comment) (Walk dog, go outside, skating)  Frequency of Recreation/Participation: Other (Comment) (Daily)  Awareness of Community Resources:  Yes  Community Resources:  Park, Engineering geologist  Current Use: Yes  If no, Barriers?:    Expressed Interest in [employer] Information: No  [employer] of Residence:  Engineer, technical sales  Patient Main Form of Transportation: Other (Comment) ("friend")  Patient Strengths:  respectable woman; helps others  Patient Identified Areas of Improvement:  "stop downing myself and beating myself up over things I have no control over"  Patient Goal for Hospitalization:  "get some rest and get back to life"  Current SI (including self-harm):  No  Current HI:  No  Current AVH: No  Staff Intervention Plan: Group Attendance, Collaborate with Interdisciplinary Treatment Team  Consent to Intern Participation: N/A   Lylee Corrow-McCall, LRT,CTRS Kamron Vanwyhe A Duvall Comes-McCall 11/24/2023, 2:01 PM

## 2023-11-24 NOTE — Plan of Care (Signed)
  Problem: Health Behavior/Discharge Planning: Goal: Compliance with treatment plan for underlying cause of condition will improve Outcome: Progressing   Problem: Physical Regulation: Goal: Ability to maintain clinical measurements within normal limits will improve Outcome: Progressing

## 2023-11-24 NOTE — Group Note (Signed)
 Recreation Therapy Group Note   Group Topic:Self-Esteem  Group Date: 11/24/2023 Start Time: 1020 End Time: 1050 Facilitators: Jadis Mika-McCall, LRT,CTRS Location: [address] Dayroom   Group Topic: Self-Esteem  Goal Area(s) Addresses:  Patient will successfully identify positive attributes about themselves.  Patient will identify healthy ways to increase self-esteem. Patient will acknowledge benefit(s) of improved self-esteem.   Intervention: Worksheet, colored pencils  Activity: Pictures of Me. LRT and patients discussed the meaning of self esteem and its importance in helping create the person you are. LRT then gave patients a worksheet with four blank picture frames. Patients were to identify four different parts of themselves (ex.teacher). Once those areas were identified, patients were to draw a picture that coincides with the identity identified by patient.   Education:  Self-Esteem, Discharge Planning  Education Outcome: Acknowledges education/In group clarification offered/Needs additional education   Affect/Mood: N/A   Participation Level: Did not attend    Clinical Observations/Individualized Feedback:      Plan: Continue to engage patient in RT group sessions 2-3x/week.   Riot Waterworth-McCall, LRT,CTRS 11/24/2023 1:37 PM

## 2023-11-24 NOTE — Progress Notes (Signed)
 Collateral contact - [name] (mom) [phone]   10:43 AM - CSW attempted to call mom.  The message said, "The mailbox is full and can't accept new messages at this time."   Collateral contact - [name] (friend/ex-boyfriend) [phone]  10:45 AM - CSW left a voicemail.     Kynslee Baham, LCSWA 11/24/2023

## 2023-11-24 NOTE — BH IP Treatment Plan (Signed)
 Interdisciplinary Treatment and Diagnostic Plan Update  11/24/2023 Time of Session: 1430 Lauren Poole MRN: [id number]  Principal Diagnosis: MDD (major depressive disorder), single episode, severe with psychosis (HCC)  Secondary Diagnoses: Principal Problem:   MDD (major depressive disorder), single episode, severe with psychosis (HCC)   Current Medications:  Current Facility-Administered Medications  Medication Dose Route Frequency Provider Last Rate Last Admin   acetaminophen (TYLENOL) tablet 650 mg  650 mg Oral Q6H PRN Dahlia Byes C, NP   650 mg at 11/22/23 2248   alum & mag hydroxide-simeth (MAALOX/MYLANTA) 200-200-20 MG/5ML suspension 30 mL  30 mL Oral Q4H PRN Onuoha, Josephine C, NP       haloperidol (HALDOL) tablet 5 mg  5 mg Oral TID PRN Dahlia Byes C, NP       And   diphenhydrAMINE (BENADRYL) capsule 50 mg  50 mg Oral TID PRN Dahlia Byes C, NP       haloperidol lactate (HALDOL) injection 5 mg  5 mg Intramuscular TID PRN Dahlia Byes C, NP       And   diphenhydrAMINE (BENADRYL) injection 50 mg  50 mg Intramuscular TID PRN Dahlia Byes C, NP       And   LORazepam (ATIVAN) injection 2 mg  2 mg Intramuscular TID PRN Dahlia Byes C, NP       haloperidol lactate (HALDOL) injection 10 mg  10 mg Intramuscular TID PRN Dahlia Byes C, NP       And   diphenhydrAMINE (BENADRYL) injection 50 mg  50 mg Intramuscular TID PRN Dahlia Byes C, NP       And   LORazepam (ATIVAN) injection 2 mg  2 mg Intramuscular TID PRN Dahlia Byes C, NP       escitalopram (LEXAPRO) tablet 5 mg  5 mg Oral QHS Izediuno, Delight Ovens, MD   5 mg at 11/23/23 2028   hydrOXYzine (ATARAX) tablet 25 mg  25 mg Oral TID PRN Earney Navy, NP       nicotine (NICODERM CQ - dosed in mg/24 hours) patch 14 mg  14 mg Transdermal Daily Attiah, Nadir, MD   14 mg at 11/24/23 0824   traZODone (DESYREL) tablet 50 mg  50 mg Oral QHS PRN Dahlia Byes C, NP   50 mg at  11/22/23 2039   PTA Medications: Medications Prior to Admission  Medication Sig Dispense Refill Last Dose/Taking   OLANZapine zydis (ZYPREXA) 10 MG disintegrating tablet Take 1 tablet (10 mg total) by mouth 2 (two) times daily. For mood control 60 tablet 0    traZODone (DESYREL) 50 MG tablet Take 1 tablet (50 mg total) by mouth at bedtime. For sleep. (Patient taking differently: Take 50 mg by mouth at bedtime as needed. For sleep.) 30 tablet 0     Patient Stressors: Financial difficulties   Medication change or noncompliance    Patient Strengths: General fund of knowledge  Supportive family/friends   Treatment Modalities: Medication Management, Group therapy, Case management,  1 to 1 session with clinician, Psychoeducation, Recreational therapy.   Physician Treatment Plan for Primary Diagnosis: MDD (major depressive disorder), single episode, severe with psychosis (HCC) Long Term Goal(s): Improvement in symptoms so as ready for discharge   Short Term Goals: Ability to identify changes in lifestyle to reduce recurrence of condition will improve  Medication Management: Evaluate patient's response, side effects, and tolerance of medication regimen.  Therapeutic Interventions: 1 to 1 sessions, Unit Group sessions and Medication administration.  Evaluation of Outcomes: Not Progressing  Physician Treatment Plan for Secondary Diagnosis: Principal Problem:   MDD (major depressive disorder), single episode, severe with psychosis (HCC)  Long Term Goal(s): Improvement in symptoms so as ready for discharge   Short Term Goals: Ability to identify changes in lifestyle to reduce recurrence of condition will improve     Medication Management: Evaluate patient's response, side effects, and tolerance of medication regimen.  Therapeutic Interventions: 1 to 1 sessions, Unit Group sessions and Medication administration.  Evaluation of Outcomes: Not Progressing   RN Treatment Plan for Primary  Diagnosis: MDD (major depressive disorder), single episode, severe with psychosis (HCC) Long Term Goal(s): Knowledge of disease and therapeutic regimen to maintain health will improve  Short Term Goals: Ability to remain free from injury will improve, Ability to verbalize frustration and anger appropriately will improve, Ability to demonstrate self-control, Ability to participate in decision making will improve, Ability to verbalize feelings will improve, Ability to disclose and discuss suicidal ideas, Ability to identify and develop effective coping behaviors will improve, and Compliance with prescribed medications will improve  Medication Management: RN will administer medications as ordered by provider, will assess and evaluate patient's response and provide education to patient for prescribed medication. RN will report any adverse and/or side effects to prescribing provider.  Therapeutic Interventions: 1 on 1 counseling sessions, Psychoeducation, Medication administration, Evaluate responses to treatment, Monitor vital signs and CBGs as ordered, Perform/monitor CIWA, COWS, AIMS and Fall Risk screenings as ordered, Perform wound care treatments as ordered.  Evaluation of Outcomes: Not Progressing   LCSW Treatment Plan for Primary Diagnosis: MDD (major depressive disorder), single episode, severe with psychosis (HCC) Long Term Goal(s): Safe transition to appropriate next level of care at discharge, Engage patient in therapeutic group addressing interpersonal concerns.  Short Term Goals: Engage patient in aftercare planning with referrals and resources, Increase social support, Increase ability to appropriately verbalize feelings, Increase emotional regulation, Facilitate acceptance of mental health diagnosis and concerns, Facilitate patient progression through stages of change regarding substance use diagnoses and concerns, Identify triggers associated with mental health/substance abuse issues, and  Increase skills for wellness and recovery  Therapeutic Interventions: Assess for all discharge needs, 1 to 1 time with Social worker, Explore available resources and support systems, Assess for adequacy in community support network, Educate family and significant other(s) on suicide prevention, Complete Psychosocial Assessment, Interpersonal group therapy.  Evaluation of Outcomes: Not Progressing   Progress in Treatment: Attending groups: No. Participating in groups: No. Taking medication as prescribed: Yes. Toleration medication: Yes. Family/Significant other contact made: No, will contact:  Mom [name] [phone] Patient understands diagnosis: Yes. Discussing patient identified problems/goals with staff: Yes. Medical problems stabilized or resolved: Yes. Denies suicidal/homicidal ideation: Yes. Issues/concerns per patient self-inventory: No.  New problem(s) identified: No, Describe:  none  New Short Term/Long Term Goal(s): medication stabilization, elimination of SI thoughts, development of comprehensive mental wellness plan.    Patient Goals:  "Resting my mind"  Discharge Plan or Barriers: Patient recently admitted. CSW will continue to follow and assess for appropriate referrals and possible discharge planning.    Reason for Continuation of Hospitalization: Anxiety Depression Medication stabilization  Estimated Length of Stay: 5-7 days  Last 3 [place] Suicide Severity Risk Score: Flowsheet Row [facility] Ad[facility] (Discharged) [facility]  C-SSRS RISK CATEGORY High Risk High Risk High Risk  Last PHQ 2/9 Scores:     No data to display          Scribe for Treatment Team: Kathi Der, LCSWA 11/24/2023 2:30 PM

## 2023-11-24 NOTE — Group Note (Signed)
 Date:  11/24/2023 Time:  8:30 PM  Group Topic/Focus:  Wrap-Up Group:   The focus of this group is to help patients review their daily goal of treatment and discuss progress on daily workbooks.    Participation Level:  Active  Participation Quality:  Appropriate  Affect:  Appropriate  Cognitive:  Appropriate  Insight: Appropriate  Engagement in Group:  Engaged  Modes of Intervention:  Education and Exploration  Additional Comments:  Patient attended and participated in group tonight.  She stated that her goal was to meet more social. She meet her goal today.  Lauren Poole [facility] 11/24/2023, 8:30 PM

## 2023-11-24 NOTE — BHH Group Notes (Signed)
 Adult Psychoeducational Group Note  Date:  11/24/2023 Time:  9:38 AM  Group Topic/Focus:  Goals Group:   The focus of this group is to help patients establish daily goals to achieve during treatment and discuss how the patient can incorporate goal setting into their daily lives to aide in recovery. Orientation:   The focus of this group is to educate the patient on the purpose and policies of crisis stabilization and provide a format to answer questions about their admission.  The group details unit policies and expectations of patients while admitted.  Participation Level:  Active  Participation Quality:  Attentive  Affect:  Appropriate  Cognitive:  Appropriate  Insight: Appropriate  Engagement in Group:  Engaged  Modes of Intervention:  Discussion  Additional Comments:  Pt attended the goals group and remained appropriate and engaged throughout the duration of the group.   Sheran Lawless 11/24/2023, 9:38 AM

## 2023-11-24 NOTE — BH Assessment (Signed)
 Patient alert and cooperative. Attended group and snack with other patients last evening. Pt was started on lexapro 5mg  po this last evening and she came back . later and stated she feels like herself again. She denied SI/HI, and AVH. 15 min checks maintained. No voiced complaints.

## 2023-11-24 NOTE — Progress Notes (Signed)
   11/24/23 2200  Psych Admission Type (Psych Patients Only)  Admission Status Involuntary  Psychosocial Assessment  Patient Complaints None  Eye Contact Fair  Facial Expression Animated  Affect Appropriate to circumstance  Speech Logical/coherent  Interaction Assertive  Motor Activity Slow  Appearance/Hygiene Unremarkable  Behavior Characteristics Cooperative;Appropriate to situation  Mood Pleasant  Thought Process  Coherency Circumstantial  Content Blaming self  Delusions None reported or observed  Perception WDL  Hallucination None reported or observed  Judgment WDL  Confusion None  Danger to Self  Current suicidal ideation? Denies  Agreement Not to Harm Self Yes  Description of Agreement verbal  Danger to Others  Danger to Others None reported or observed

## 2023-11-25 DIAGNOSIS — F323 Major depressive disorder, single episode, severe with psychotic features: Secondary | ICD-10-CM | POA: Diagnosis not present

## 2023-11-25 MED ORDER — ESCITALOPRAM OXALATE 5 MG PO TABS
5.0000 mg | ORAL_TABLET | Freq: Every day | ORAL | Status: DC
  Administered 2023-11-25 – 2023-11-29 (×5): 5 mg via ORAL
  Filled 2023-11-25 (×4): qty 1
  Filled 2023-11-25: qty 10
  Filled 2023-11-25 (×2): qty 1

## 2023-11-25 MED ORDER — ESCITALOPRAM OXALATE 10 MG PO TABS
10.0000 mg | ORAL_TABLET | Freq: Every day | ORAL | Status: DC
Start: 2023-11-25 — End: 2023-11-25
  Filled 2023-11-25 (×2): qty 1

## 2023-11-25 NOTE — Progress Notes (Signed)
 Patient presented with moments of abnormal behavior throughout the day. Patient removed her pants and washed them in the sink and then walked up to the nurse's station with no pants, requesting they be dried. Later patient presented with anger and suspicion about people talking about her behind her back. Patient was observed to be talking to herself at one point and then to be napping on the small bench in her room instead of the bed. At lunch another patient refused to move ahead in line next to her because he stated "she's got something going on." As the day progressed and patient was not moved down to 400 hall patient exhibited more appropriate behavior and interaction with others. Med compliant, no adverse effects noted. Safety maintained.

## 2023-11-25 NOTE — Plan of Care (Signed)
  Problem: Education: Goal: Emotional status will improve Outcome: Progressing Goal: Verbalization of understanding the information provided will improve Outcome: Progressing   Problem: Activity: Goal: Interest or engagement in activities will improve Outcome: Progressing   Problem: Coping: Goal: Ability to verbalize frustrations and anger appropriately will improve Outcome: Progressing   Problem: Health Behavior/Discharge Planning: Goal: Identification of resources available to assist in meeting health care needs will improve Outcome: Progressing   Problem: Physical Regulation: Goal: Ability to maintain clinical measurements within normal limits will improve Outcome: Progressing

## 2023-11-25 NOTE — Progress Notes (Signed)
 [facility] MD Progress Note  11/25/2023 10:28 AM Lauren Poole  MRN:  [id number] Subjective:   33 year old African-American female, single, recently unemployed, lives alone.  Background history of trauma, PTSD and MDD single episode.  Patient was recently discharged from our unit.  Last hospitalization was in context of serious self-injurious suicidal attempts.  Current presentation was via the police.  She had called the police to take her dog away.  She expressed thoughts of drinking bleach. Routine labs are essentially normal.  No alcohol or any psychoactive substance on board.  Chart reviewed today.  Patient discussed at multidisciplinary team meeting.  Nursing staff reports that patient slept for 8.5 hours last night.  She is requesting for higher dose of escitalopram.  She is tolerating it well.  No challenging behavior.  She has been mostly staying to self in her room.  Seen today.  Patient states that she feels better.  No side effects from her medication.  No self-injurious thoughts since she has been in the hospital.  Feels isolated on the side of the unit.  Will prefer to go to groups on the unit.  We have agreed to transfer her to 400 units so she can activate more.  No manic features.  No features of activation.  No psychotic features.  Encouraged to keep ventilating her feelings to staff.   Principal Problem: MDD (major depressive disorder), single episode, severe with psychosis (HCC) Diagnosis: Principal Problem:   MDD (major depressive disorder), single episode, severe with psychosis (HCC)  Total Time spent with patient: 30 minutes  Past Psychiatric History:  See H&P  Past Medical History:  Past Medical History:  Diagnosis Date   Chlamydia    Gonorrhea     Past Surgical History:  Procedure Laterality Date   NO PAST SURGERIES     Family History:  Family History  Problem Relation Age of Onset   Schizophrenia Sister    Family Psychiatric  History:  See  H&P  Social History:  Social History   Substance and Sexual Activity  Alcohol Use Yes   Alcohol/week: 2.0 standard drinks of alcohol   Types: 2 Glasses of wine per week     Social History   Substance and Sexual Activity  Drug Use Yes   Types: Marijuana    Social History   Socioeconomic History   Marital status: Single    Spouse name: Not on file   Number of children: Not on file   Years of education: Not on file   Highest education level: Not on file  Occupational History   Not on file  Tobacco Use   Smoking status: Light Smoker    Current packs/day: 0.50    Average packs/day: 0.5 packs/day for 6.1 years (3.1 ttl pk-yrs)    Types: Cigarettes    Start date: 10/2017   Smokeless tobacco: Never  Vaping Use   Vaping status: Never Used  Substance and Sexual Activity   Alcohol use: Yes    Alcohol/week: 2.0 standard drinks of alcohol    Types: 2 Glasses of wine per week   Drug use: Yes    Types: Marijuana   Sexual activity: Yes    Birth control/protection: None  Other Topics Concern   Not on file  Social History Narrative   Not on file   Social Drivers of Health   Financial Resource Strain: Not on file  Food Insecurity: No Food Insecurity (11/22/2023)   Hunger Vital Sign    Worried About Running Out of  Food in the Last Year: Never true    Ran Out of Food in the Last Year: Never true  Transportation Needs: No Transportation Needs (11/22/2023)   PRAPARE - Administrator, Civil Service (Medical): No    Lack of Transportation (Non-Medical): No  Physical Activity: Not on file  Stress: Not on file  Social Connections: Not on file   Additional Social History:   Current Medications: Current Facility-Administered Medications  Medication Dose Route Frequency Provider Last Rate Last Admin   acetaminophen (TYLENOL) tablet 650 mg  650 mg Oral Q6H PRN Dahlia Byes C, NP   650 mg at 11/22/23 2248   alum & mag hydroxide-simeth (MAALOX/MYLANTA) 200-200-20  MG/5ML suspension 30 mL  30 mL Oral Q4H PRN Onuoha, Josephine C, NP       haloperidol (HALDOL) tablet 5 mg  5 mg Oral TID PRN Dahlia Byes C, NP       And   diphenhydrAMINE (BENADRYL) capsule 50 mg  50 mg Oral TID PRN Dahlia Byes C, NP       haloperidol lactate (HALDOL) injection 5 mg  5 mg Intramuscular TID PRN Dahlia Byes C, NP       And   diphenhydrAMINE (BENADRYL) injection 50 mg  50 mg Intramuscular TID PRN Dahlia Byes C, NP       And   LORazepam (ATIVAN) injection 2 mg  2 mg Intramuscular TID PRN Dahlia Byes C, NP       haloperidol lactate (HALDOL) injection 10 mg  10 mg Intramuscular TID PRN Dahlia Byes C, NP       And   diphenhydrAMINE (BENADRYL) injection 50 mg  50 mg Intramuscular TID PRN Dahlia Byes C, NP       And   LORazepam (ATIVAN) injection 2 mg  2 mg Intramuscular TID PRN Dahlia Byes C, NP       escitalopram (LEXAPRO) tablet 10 mg  10 mg Oral QHS Viola Kinnick, Delight Ovens, MD       hydrOXYzine (ATARAX) tablet 25 mg  25 mg Oral TID PRN Dahlia Byes C, NP       nicotine (NICODERM CQ - dosed in mg/24 hours) patch 14 mg  14 mg Transdermal Daily Attiah, Nadir, MD   14 mg at 11/25/23 0802   traZODone (DESYREL) tablet 50 mg  50 mg Oral QHS PRN Dahlia Byes C, NP   50 mg at 11/22/23 2039    Lab Results: No results found for this or any previous visit (from the past 48 hours).  Blood Alcohol level:  Lab Results  Component Value Date   ETH <10 11/21/2023    Metabolic Disorder Labs: Lab Results  Component Value Date   HGBA1C 5.1 10/31/2023   MPG 99.67 10/31/2023   No results found for: "PROLACTIN" Lab Results  Component Value Date   CHOL 136 10/31/2023   TRIG 75 10/31/2023   HDL 37 (L) 10/31/2023   CHOLHDL 3.7 10/31/2023   VLDL 15 10/31/2023   LDLCALC 84 10/31/2023    Physical Findings: AIMS:  , ,  ,  ,    CIWA:    COWS:     Musculoskeletal: Strength & Muscle Tone: within normal limits Gait & Station:  normal Patient leans: N/A  Psychiatric Specialty Exam:  Presentation  General Appearance:  Casually dressed, overweight, not in any distress, appropriate behavior, engaged politely.  No EPS.  Eye Contact: Good.  Speech: Spontaneous.  Normalizing rate, tone and volume.  Mood and Affect  Mood: Subjectively and objectively better.  Affect: Mood congruent.  Thought Process  Thought Processes: Linear and goal directed.  Descriptions of Associations:Intact  Orientation:Full (Time, Place and Person)  Thought Content: No current suicidal thoughts.  No homicidal thoughts.  No thoughts of violence.  No negative ruminative flooding.  No guilty ruminations.  No delusional theme.  No obsessions.  Hallucinations: No hallucination in any modality.  Sensorium  Memory: Good.  Judgment: Good.  Insight: Good  Executive Functions  Concentration: Good.  Attention Span: Good.  Recall: Good.  Fund of Knowledge: Good.  Language: Good   Psychomotor Activity  Normal psychomotor activity    Physical Exam: Physical Exam ROS Blood pressure (!) 126/90, pulse 64, temperature 98.3 F (36.8 C), temperature source Oral, resp. rate 15, height 5\' 4"  (1.626 m), weight 111.6 kg, SpO2 99%. Body mass index is 42.23 kg/m.   Treatment Plan Summary: Patient has tolerated recent introduction of escitalopram.  She is in better spirits.  She feels good in this contained environment.  She is requesting more activation.  No manic features.  No psychotic features.  We will keep escitalopram at current dose for one more night and then optimized tomorrow.  We will hopefully transfer her to [address] so she can activate more.  We will gather collateral from family and evaluate her father.  1.  We will increase Escitalopram to 10 mg at bedtime from tomorrow. 2.  We will transfer her to [address] [employer]. 3.  We will encourage group activation. 4.  We will continue to monitor mood,  behavior and interaction with others. 5.  Social worker will explore locally available support structure for her. 6.  Social worker will obtain collateral from family/friends.  Georgiann Cocker, MD 11/25/2023, 10:28 AM

## 2023-11-25 NOTE — [facility] Group Notes (Signed)
 [facility] Group Notes:  (Nursing/MHT/Case Management/Adjunct)  Date:  11/25/2023  Time:  8:31 PM  Type of Therapy:  Psychoeducational Skills  Participation Level:  Minimal  Participation Quality:  Appropriate  Affect:  Appropriate  Cognitive:  Lacking  Insight:  Limited  Engagement in Group:  Developing/Improving  Modes of Intervention:  Education  Summary of Progress/Problems:  The patient rated her day as a 4 out of 10. She states that she felt "overwhelmed" today but did not go into detail.   Lauren Poole 11/25/2023, 8:31 PM

## 2023-11-25 NOTE — Group Note (Signed)
 Occupational Therapy Group Note  Group Topic:Coping Skills  Group Date: 11/25/2023 Start Time: 1420 End Time: 1550 Facilitators: Ted Mcalpine, OT   Group Description: Group encouraged increased engagement and participation through discussion and activity focused on "Coping Ahead." Patients were split up into teams and selected a card from a stack of positive coping strategies. Patients were instructed to act out/charade the coping skill for other peers to guess and receive points for their team. Discussion followed with a focus on identifying additional positive coping strategies and patients shared how they were going to cope ahead over the weekend while continuing hospitalization stay.  Therapeutic Goal(s): Identify positive vs negative coping strategies. Identify coping skills to be used during hospitalization vs coping skills outside of hospital/at home Increase participation in therapeutic group environment and promote engagement in treatment   Participation Level: Minimal   Participation Quality: Independent   Behavior: Appropriate   Speech/Thought Process: Loose association    Affect/Mood: Appropriate   Insight: Fair   Judgement: Fair      Modes of Intervention: Education  Patient Response to Interventions:  Attentive   Plan: Continue to engage patient in OT groups 2 - 3x/week.  11/25/2023  Ted Mcalpine, OT  Kerrin Champagne, OT

## 2023-11-25 NOTE — Progress Notes (Signed)
   11/25/23 0900  Psych Admission Type (Psych Patients Only)  Admission Status Involuntary  Psychosocial Assessment  Patient Complaints None  Eye Contact Fair  Facial Expression Animated  Affect Appropriate to circumstance  Speech Logical/coherent  Interaction Assertive  Motor Activity Slow  Appearance/Hygiene Unremarkable  Behavior Characteristics Cooperative;Calm  Mood Pleasant  Thought Process  Coherency Circumstantial  Content WDL  Delusions None reported or observed  Perception WDL  Hallucination None reported or observed  Judgment WDL  Confusion None  Danger to Self  Current suicidal ideation? Denies  Agreement Not to Harm Self Yes  Description of Agreement verbal  Danger to Others  Danger to Others None reported or observed

## 2023-11-25 NOTE — Group Note (Signed)
 Recreation Therapy Group Note   Group Topic:Stress Management  Group Date: 11/25/2023 Start Time: 1000 End Time: 1030 Facilitators: Treavor Blomquist-McCall, LRT,CTRS Location: 500 Hall Dayroom   Group Topic: Stress Management  Goal Area(s) Addresses:  Patient will identify positive stress management techniques. Patient will identify benefits of using stress management post d/c.  Intervention: Insight Timer App  Activity: Meditation. LRT and patients discussed meditation and its benefits. LRT then played a meditation that focused on getting in touch with your 5 senses. The meditation led patients to focus in on each sense one at a time and focus on what sensations or thoughts each sense brought to the forefront.    Education:  Stress Management, Discharge Planning.   Education Outcome: Acknowledges Education   Affect/Mood: N/A   Participation Level: Did not attend    Clinical Observations/Individualized Feedback:     Plan: Continue to engage patient in RT group sessions 2-3x/week.   Lauren Poole, LRT,CTRS  11/25/2023 10:51 AM

## 2023-11-25 NOTE — Progress Notes (Signed)
 [facility] MD Progress Note  11/25/2023 2:19 PM Lauren Poole  MRN:  [id number] Subjective:   33 year old African-American female, single, recently unemployed, lives alone.  Background history of trauma, PTSD and MDD single episode.  Patient was recently discharged from our unit.  Last hospitalization was in context of serious self-injurious suicidal attempts.  Current presentation was via the police.  She had called the police to take her dog away.  She expressed thoughts of drinking bleach. Routine labs are essentially normal.  No alcohol or any psychoactive substance on board.  Chart reviewed today.  Patient discussed at multidisciplinary team meeting.  Nursing staff reports that patient slept for 8.25 hours last night.  She has been pleasant and interactive on the unit.  She has not voiced any hopelessness or worthlessness lately.  No PRNs required.  Social worker got in contact with patient's female friend.  He will be supportive.  States that patient could stay with him for a while.  At interview today, patient tells me that she is doing better and feels good on 5 mg of escitalopram.  She would rather keep it at that dose rather than increase it further.  She tells me that she has been in communication with her sister who lives in [place].  A job opportunity has opened up in [place] which she plans to apply for.  She will stay with her sister until she gets on her feet today.  Patient states that she is pleased she has taking care of her dog by handing her dog to the police.  States that she hopes to get home so that she can move her things to her mother's place.  She has obtained the end of the month to move out of her current place.  Patient is not endorsing any irritability.  No thoughts of self-harm.  No rageful thoughts towards others.  There are no manic symptoms.  There are no psychotic symptoms.  No adverse effects from her medications. Encouraged to ventilate her feelings to  staff.  Principal Problem: MDD (major depressive disorder), single episode, severe with psychosis (HCC) Diagnosis: Principal Problem:   MDD (major depressive disorder), single episode, severe with psychosis (HCC)  Total Time spent with patient: 30 minutes  Past Psychiatric History:  See H&P  Past Medical History:  Past Medical History:  Diagnosis Date   Chlamydia    Gonorrhea     Past Surgical History:  Procedure Laterality Date   NO PAST SURGERIES     Family History:  Family History  Problem Relation Age of Onset   Schizophrenia Sister    Family Psychiatric  History:  See H&P  Social History:  Social History   Substance and Sexual Activity  Alcohol Use Yes   Alcohol/week: 2.0 standard drinks of alcohol   Types: 2 Glasses of wine per week     Social History   Substance and Sexual Activity  Drug Use Yes   Types: Marijuana    Social History   Socioeconomic History   Marital status: Single    Spouse name: Not on file   Number of children: Not on file   Years of education: Not on file   Highest education level: Not on file  Occupational History   Not on file  Tobacco Use   Smoking status: Light Smoker    Current packs/day: 0.50    Average packs/day: 0.5 packs/day for 6.1 years (3.1 ttl pk-yrs)    Types: Cigarettes    Start date:  10/2017   Smokeless tobacco: Never  Vaping Use   Vaping status: Never Used  Substance and Sexual Activity   Alcohol use: Yes    Alcohol/week: 2.0 standard drinks of alcohol    Types: 2 Glasses of wine per week   Drug use: Yes    Types: Marijuana   Sexual activity: Yes    Birth control/protection: None  Other Topics Concern   Not on file  Social History Narrative   Not on file   Social Drivers of Health   Financial Resource Strain: Not on file  Food Insecurity: No Food Insecurity (11/22/2023)   Hunger Vital Sign    Worried About Running Out of Food in the Last Year: Never true    Ran Out of Food in the Last Year: Never  true  Transportation Needs: No Transportation Needs (11/22/2023)   PRAPARE - Administrator, Civil Service (Medical): No    Lack of Transportation (Non-Medical): No  Physical Activity: Not on file  Stress: Not on file  Social Connections: Not on file   Additional Social History:   Current Medications: Current Facility-Administered Medications  Medication Dose Route Frequency Provider Last Rate Last Admin   acetaminophen (TYLENOL) tablet 650 mg  650 mg Oral Q6H PRN Dahlia Byes C, NP   650 mg at 11/22/23 2248   alum & mag hydroxide-simeth (MAALOX/MYLANTA) 200-200-20 MG/5ML suspension 30 mL  30 mL Oral Q4H PRN Onuoha, Josephine C, NP       haloperidol (HALDOL) tablet 5 mg  5 mg Oral TID PRN Dahlia Byes C, NP       And   diphenhydrAMINE (BENADRYL) capsule 50 mg  50 mg Oral TID PRN Dahlia Byes C, NP       haloperidol lactate (HALDOL) injection 5 mg  5 mg Intramuscular TID PRN Dahlia Byes C, NP       And   diphenhydrAMINE (BENADRYL) injection 50 mg  50 mg Intramuscular TID PRN Dahlia Byes C, NP       And   LORazepam (ATIVAN) injection 2 mg  2 mg Intramuscular TID PRN Dahlia Byes C, NP       haloperidol lactate (HALDOL) injection 10 mg  10 mg Intramuscular TID PRN Dahlia Byes C, NP       And   diphenhydrAMINE (BENADRYL) injection 50 mg  50 mg Intramuscular TID PRN Dahlia Byes C, NP       And   LORazepam (ATIVAN) injection 2 mg  2 mg Intramuscular TID PRN Dahlia Byes C, NP       escitalopram (LEXAPRO) tablet 5 mg  5 mg Oral QHS Martrell Eguia, Delight Ovens, MD       hydrOXYzine (ATARAX) tablet 25 mg  25 mg Oral TID PRN Dahlia Byes C, NP       nicotine (NICODERM CQ - dosed in mg/24 hours) patch 14 mg  14 mg Transdermal Daily Attiah, Nadir, MD   14 mg at 11/25/23 0802   traZODone (DESYREL) tablet 50 mg  50 mg Oral QHS PRN Dahlia Byes C, NP   50 mg at 11/22/23 2039    Lab Results: No results found for this or any previous visit  (from the past 48 hours).  Blood Alcohol level:  Lab Results  Component Value Date   ETH <10 11/21/2023    Metabolic Disorder Labs: Lab Results  Component Value Date   HGBA1C 5.1 10/31/2023   MPG 99.67 10/31/2023   No results found for: "PROLACTIN" Lab Results  Component Value Date   CHOL 136 10/31/2023   TRIG 75 10/31/2023   HDL 37 (L) 10/31/2023   CHOLHDL 3.7 10/31/2023   VLDL 15 10/31/2023   LDLCALC 84 10/31/2023    Physical Findings: AIMS:  , ,  ,  ,    CIWA:    COWS:     Musculoskeletal: Strength & Muscle Tone: within normal limits Gait & Station: normal Patient leans: N/A  Psychiatric Specialty Exam:  Presentation  General Appearance:  Better grooming, out in the day room today.  Engage warmly.  No EPS.  Eye Contact: Good.  Speech: Spontaneous.  Normal rate, tone and volume.  Mood and Affect  Mood: Euthymic.  Affect: Mobilizing full range of affect. Thought Process  Thought Processes: Linear and goal directed.  Descriptions of Associations:Intact  Orientation:Full (Time, Place and Person)  Thought Content: No current suicidal thoughts.  No homicidal thoughts.  No thoughts of violence.  No negative ruminative flooding.  No guilty ruminations.  No delusional theme.  No obsessions.  Hallucinations: No hallucination in any modality.  Sensorium  Memory: Good.  Judgment: Good.  Insight: Good  Executive Functions  Concentration: Good.  Attention Span: Good.  Recall: Good.  Fund of Knowledge: Good.  Language: Good   Psychomotor Activity  Normal psychomotor activity    Physical Exam: Physical Exam ROS Blood pressure (!) 126/90, pulse 64, temperature 98.3 F (36.8 C), temperature source Oral, resp. rate 15, height 5\' 4"  (1.626 m), weight 111.6 kg, SpO2 99%. Body mass index is 42.23 kg/m.   Treatment Plan Summary: Patient is tolerating her medication well.  She is responding well to treatment.  She is future  oriented.  She is making plans on how to jumpstart her life again.  No dangerousness.  She has good support from family.  We will keep her medications the same.  We will evaluate her further.  Hopeful discharge tomorrow if she maintains progress.  1.  Escitalopram 5 mg at bedtime. 2.  Continue to encourage unit groups and therapeutic activities. 3.  Continue to monitor mood behavior and interaction with others. 4.  Social worker will coordinate discharge and aftercare planning.    Georgiann Cocker, MD 11/25/2023, 2:19 PM

## 2023-11-25 NOTE — Plan of Care (Signed)
  Problem: Health Behavior/Discharge Planning: Goal: Identification of resources available to assist in meeting health care needs will improve Outcome: Progressing Goal: Compliance with treatment plan for underlying cause of condition will improve Outcome: Progressing   Problem: Physical Regulation: Goal: Ability to maintain clinical measurements within normal limits will improve Outcome: Progressing Patient is compliant with treatment plan. Denies SI/HI/A/VH and verbally contracts for safety. Compliant with medications no adverse effects noted. Q 15 minutes safety checks ongoing. Patient remains safe.

## 2023-11-26 DIAGNOSIS — F323 Major depressive disorder, single episode, severe with psychotic features: Secondary | ICD-10-CM | POA: Diagnosis not present

## 2023-11-26 MED ORDER — NICOTINE POLACRILEX 2 MG MT GUM
2.0000 mg | CHEWING_GUM | OROMUCOSAL | Status: DC | PRN
  Administered 2023-11-26: 2 mg via ORAL

## 2023-11-26 NOTE — Progress Notes (Signed)
   11/26/23 0900  Psych Admission Type (Psych Patients Only)  Admission Status Involuntary  Psychosocial Assessment  Patient Complaints None  Eye Contact Fair  Facial Expression Animated  Affect Appropriate to circumstance  Speech Logical/coherent  Interaction Assertive  Motor Activity Slow  Appearance/Hygiene Unremarkable  Behavior Characteristics Cooperative;Calm  Mood Pleasant  Thought Process  Coherency Circumstantial  Content WDL  Delusions None reported or observed  Perception WDL  Hallucination None reported or observed  Judgment WDL  Confusion None  Danger to Self  Current suicidal ideation? Denies  Agreement Not to Harm Self Yes  Description of Agreement verbal  Danger to Others  Danger to Others None reported or observed

## 2023-11-26 NOTE — Group Note (Signed)
 Recreation Therapy Group Note   Group Topic:Team Building  Group Date: 11/26/2023 Start Time: 1005 End Time: 1030 Facilitators: Margues Filippini-McCall, LRT,CTRS Location: [address] Dayroom   Group Topic: Communication, Team Building, Problem Solving  Goal Area(s) Addresses:  Patient will effectively work with peer towards shared goal.  Patient will identify skills used to make activity successful.  Patient will identify how skills used during activity can be applied to reach post d/c goals.   Intervention: STEM Activity- Glass blower/designer  Activity: Tallest [employer]. In teams of 5-6, patients were given 11 craft pipe cleaners. Using the materials provided, patients were instructed to compete again the opposing team(s) to build the tallest free-standing structure from floor level. The activity was timed; difficulty increased by Clinical research associate as Production designer, theatre/television/film continued.  Systematically resources were removed with additional directions for example, placing one arm behind their back, working in silence, and shape stipulations. LRT facilitated post-activity discussion reviewing team processes and necessary communication skills involved in completion. Patients were encouraged to reflect how the skills utilized, or not utilized, in this activity can be incorporated to positively impact support systems post discharge.  Education: Pharmacist, community, Scientist, physiological, Discharge Planning   Education Outcome: Acknowledges education/In group clarification offered/Needs additional education.    Affect/Mood: Appropriate   Participation Level: Engaged   Participation Quality: Independent   Behavior: Appropriate   Speech/Thought Process: Focused   Insight: Good   Judgement: Good   Modes of Intervention: STEM Activity   Patient Response to Interventions:  Engaged   Education Outcome:  In group clarification offered    Clinical Observations/Individualized Feedback: Pt was engaged and  interactive with peers. Pt was focused on being successful. Pt was also open to the suggestions of peers to make tower work. During debriefing, pt expressed she having people around her but not trusting them. Pt went on to explain she lacked trust in others because they haven't been through what she has. Pt stated the only person she trusts in herself. Pt was encouraged to get involved with support groups to hopefully find people she can trust to help maneuver through situations.     Plan: Continue to engage patient in RT group sessions 2-3x/week.   Kimani Hovis-McCall, LRT,CTRS 11/26/2023 1:03 PM

## 2023-11-26 NOTE — Group Note (Signed)
 Date:  11/26/2023 Time:  8:23 PM  Group Topic/Focus:  Wrap-Up Group:   The focus of this group is to help patients review their daily goal of treatment and discuss progress on daily workbooks.    Participation Level:  Active  Participation Quality:  Appropriate  Affect:  Appropriate  Cognitive:  Appropriate  Insight: Appropriate  Engagement in Group:  Engaged  Modes of Intervention:  Education and Exploration  Additional Comments:  Patient attended and participated in group tonight. She reports that today she learnt that she is a lot to handle, she is hard to please.  Lita Mains [facility] 11/26/2023, 8:23 PM

## 2023-11-26 NOTE — Plan of Care (Signed)
  Problem: Education: Goal: Emotional status will improve Outcome: Progressing Goal: Verbalization of understanding the information provided will improve Outcome: Progressing   Problem: Activity: Goal: Interest or engagement in activities will improve Outcome: Progressing Goal: Sleeping patterns will improve Outcome: Progressing   Problem: Coping: Goal: Ability to demonstrate self-control will improve Outcome: Progressing   Problem: Physical Regulation: Goal: Ability to maintain clinical measurements within normal limits will improve Outcome: Progressing

## 2023-11-26 NOTE — [facility] Group Notes (Signed)
 irituality Group  Topic: What matters most/What does not matter?  Theoretical Basis: Group facilitated based on principles of group psychtherapy of Lorain Childes, adapted for spiritual care. Further informed by Relational Cultural Theory and Rogerian approaches.  Goals: Foster Chartered certified accountant and mutual empathy among peers, engage the "here and now", explore and name values, contrast with what is less important and how we discern this/set boundaries, etc.  Observations: Elsey was an active and engaged participant in the group discussion.  Alycen Mack L. Sophronia Simas, M.Div [phone]

## 2023-11-26 NOTE — Progress Notes (Signed)
 [facility] MD Progress Note  11/26/2023 3:33 PM Lauren Poole  MRN:  [id number] Subjective:   33 year old African-American female, single, recently unemployed, lives alone.  Background history of trauma, PTSD and MDD single episode.  Patient was recently discharged from our unit.  Last hospitalization was in context of serious self-injurious suicidal attempts.  Current presentation was via the police.  She had called the police to take her dog away.  She expressed thoughts of drinking bleach. Routine labs are essentially normal.  No alcohol or any psychoactive substance on board.  Chart reviewed today.  Patient discussed at multidisciplinary team meeting.  Nursing staff reports that patient slept for 8.25 hours.  She is grooming herself.  No challenging behavior on the unit.  She eats her meals.  She is drinking enough fluids.  She is not endorsing any futility thoughts.  Seen today.  Feels ready to go.  I requested for collateral from her sister or her mother which she declined.  States that her mother is worried after she called the police recently.  Patient feels like her mother would not want her to get discharged soon.  I probed her further about her plans to go to [place], patient tells me that she does not really have a job of her day at which she hopes to find one soon.  States that her goal is to go to [place] in the next month.  Patient is not exhibiting any manic features.  There are no psychotic features.  No overwhelming anxiety.  She does not appear pervasively down.  Concerns about multiple attempts in a short span of time was discussed with her.  I have encouraged her to allow [place] get collateral from family members.  Principal Problem: MDD (major depressive disorder), single episode, severe with psychosis (HCC) Diagnosis: Principal Problem:   MDD (major depressive disorder), single episode, severe with psychosis (HCC)  Total Time spent with patient: 30 minutes  Past Psychiatric  History:  See H&P  Past Medical History:  Past Medical History:  Diagnosis Date   Chlamydia    Gonorrhea     Past Surgical History:  Procedure Laterality Date   NO PAST SURGERIES     Family History:  Family History  Problem Relation Age of Onset   Schizophrenia Sister    Family Psychiatric  History:  See H&P  Social History:  Social History   Substance and Sexual Activity  Alcohol Use Yes   Alcohol/week: 2.0 standard drinks of alcohol   Types: 2 Glasses of wine per week     Social History   Substance and Sexual Activity  Drug Use Yes   Types: Marijuana    Social History   Socioeconomic History   Marital status: Single    Spouse name: Not on file   Number of children: Not on file   Years of education: Not on file   Highest education level: Not on file  Occupational History   Not on file  Tobacco Use   Smoking status: Light Smoker    Current packs/day: 0.50    Average packs/day: 0.5 packs/day for 6.1 years (3.1 ttl pk-yrs)    Types: Cigarettes    Start date: 10/2017   Smokeless tobacco: Never  Vaping Use   Vaping status: Never Used  Substance and Sexual Activity   Alcohol use: Yes    Alcohol/week: 2.0 standard drinks of alcohol    Types: 2 Glasses of wine per week   Drug use: Yes  Types: Marijuana   Sexual activity: Yes    Birth control/protection: None  Other Topics Concern   Not on file  Social History Narrative   Not on file   Social Drivers of Health   Financial Resource Strain: Not on file  Food Insecurity: No Food Insecurity (11/22/2023)   Hunger Vital Sign    Worried About Running Out of Food in the Last Year: Never true    Ran Out of Food in the Last Year: Never true  Transportation Needs: No Transportation Needs (11/22/2023)   PRAPARE - Administrator, Civil Service (Medical): No    Lack of Transportation (Non-Medical): No  Physical Activity: Not on file  Stress: Not on file  Social Connections: Not on file   Additional  Social History:   Current Medications: Current Facility-Administered Medications  Medication Dose Route Frequency Provider Last Rate Last Admin   acetaminophen (TYLENOL) tablet 650 mg  650 mg Oral Q6H PRN Dahlia Byes C, NP   650 mg at 11/26/23 1359   alum & mag hydroxide-simeth (MAALOX/MYLANTA) 200-200-20 MG/5ML suspension 30 mL  30 mL Oral Q4H PRN Onuoha, Josephine C, NP       haloperidol (HALDOL) tablet 5 mg  5 mg Oral TID PRN Dahlia Byes C, NP       And   diphenhydrAMINE (BENADRYL) capsule 50 mg  50 mg Oral TID PRN Dahlia Byes C, NP       haloperidol lactate (HALDOL) injection 5 mg  5 mg Intramuscular TID PRN Dahlia Byes C, NP       And   diphenhydrAMINE (BENADRYL) injection 50 mg  50 mg Intramuscular TID PRN Dahlia Byes C, NP       And   LORazepam (ATIVAN) injection 2 mg  2 mg Intramuscular TID PRN Dahlia Byes C, NP       haloperidol lactate (HALDOL) injection 10 mg  10 mg Intramuscular TID PRN Dahlia Byes C, NP       And   diphenhydrAMINE (BENADRYL) injection 50 mg  50 mg Intramuscular TID PRN Dahlia Byes C, NP       And   LORazepam (ATIVAN) injection 2 mg  2 mg Intramuscular TID PRN Dahlia Byes C, NP       escitalopram (LEXAPRO) tablet 5 mg  5 mg Oral QHS Zion Lint A, MD   5 mg at 11/25/23 2047   hydrOXYzine (ATARAX) tablet 25 mg  25 mg Oral TID PRN Dahlia Byes C, NP   25 mg at 11/26/23 1400   nicotine (NICODERM CQ - dosed in mg/24 hours) patch 14 mg  14 mg Transdermal Daily Attiah, Nadir, MD   14 mg at 11/25/23 0802   nicotine polacrilex (NICORETTE) gum 2 mg  2 mg Oral PRN Starleen Blue, NP   2 mg at 11/26/23 1114   traZODone (DESYREL) tablet 50 mg  50 mg Oral QHS PRN Dahlia Byes C, NP   50 mg at 11/25/23 2047    Lab Results: No results found for this or any previous visit (from the past 48 hours).  Blood Alcohol level:  Lab Results  Component Value Date   ETH <10 11/21/2023    Metabolic Disorder  Labs: Lab Results  Component Value Date   HGBA1C 5.1 10/31/2023   MPG 99.67 10/31/2023   No results found for: "PROLACTIN" Lab Results  Component Value Date   CHOL 136 10/31/2023   TRIG 75 10/31/2023   HDL 37 (L) 10/31/2023   CHOLHDL  3.7 10/31/2023   VLDL 15 10/31/2023   LDLCALC 84 10/31/2023    Physical Findings: AIMS:  , ,  ,  ,    CIWA:    COWS:     Musculoskeletal: Strength & Muscle Tone: within normal limits Gait & Station: normal Patient leans: N/A  Psychiatric Specialty Exam:  Presentation  General Appearance:  Casually dressed, not in any distress, appropriate behavior.  No EPS.  Eye Contact: Good.  Speech: Spontaneous.  Normal rate, tone and volume.  Mood and Affect  Mood: Euthymic.  Affect: Mobilizing full range of affect.  Thought Process  Thought Processes: Linear and goal directed.  Descriptions of Associations:Intact  Orientation:Full (Time, Place and Person)  Thought Content: No current suicidal thoughts.  No homicidal thoughts.  No thoughts of violence.  No negative ruminative flooding.  No guilty ruminations.  No delusional theme.  No obsessions.  Hallucinations: No hallucination in any modality.  Sensorium  Memory: Good.  Judgment: Good.  Insight: Good  Executive Functions  Concentration: Good.  Attention Span: Good.  Recall: Good.  Fund of Knowledge: Good.  Language: Good   Psychomotor Activity  Normal psychomotor activity    Physical Exam: Physical Exam ROS Blood pressure 110/79, pulse 71, temperature 98.2 F (36.8 C), temperature source Oral, resp. rate 18, height 5\' 4"  (1.626 m), weight 111.6 kg, SpO2 97%. Body mass index is 42.23 kg/m.   Treatment Plan Summary: Patient is doing better with her current regimen.  She however seems to be in the flight to health.  There are no concrete plans on seeking out employment right away.  No changes in her support structure in the community.  Family seems  to have concerns as she does not want [place] to speak to them.  We will maintain her current regimen and hopefully get collateral from family.  1.  Escitalopram 5 mg at bedtime. 2.  Continue to encourage unit groups and therapeutic activities. 3.  Continue to monitor mood behavior and interaction with others. 4.  Social worker will get collateral from her mother or her sister. 5.  Social worker will coordinate discharge and aftercare planning.    Georgiann Cocker, MD 11/26/2023, 3:33 PM

## 2023-11-27 DIAGNOSIS — F323 Major depressive disorder, single episode, severe with psychotic features: Secondary | ICD-10-CM | POA: Diagnosis not present

## 2023-11-27 NOTE — Progress Notes (Signed)
   11/26/23 2200  Psych Admission Type (Psych Patients Only)  Admission Status Involuntary  Psychosocial Assessment  Patient Complaints Anxiety  Eye Contact Fair  Facial Expression Animated  Affect Appropriate to circumstance  Speech Logical/coherent  Interaction Assertive  Motor Activity Slow  Appearance/Hygiene Unremarkable  Behavior Characteristics Cooperative  Mood Anxious;Pleasant  Thought Process  Coherency Circumstantial  Content WDL  Delusions None reported or observed  Perception WDL  Hallucination None reported or observed  Judgment WDL  Confusion None  Danger to Self  Current suicidal ideation? Denies (Denies)  Agreement Not to Harm Self Yes  Description of Agreement verbal  Danger to Others  Danger to Others None reported or observed

## 2023-11-27 NOTE — Plan of Care (Signed)
  Problem: Activity: Goal: Interest or engagement in activities will improve Outcome: Progressing   Problem: Physical Regulation: Goal: Ability to maintain clinical measurements within normal limits will improve Outcome: Progressing   Problem: Safety: Goal: Periods of time without injury will increase Outcome: Progressing

## 2023-11-27 NOTE — Plan of Care (Signed)
  Problem: Activity: Goal: Sleeping patterns will improve Outcome: Progressing   Problem: Coping: Goal: Ability to verbalize frustrations and anger appropriately will improve Outcome: Progressing Goal: Ability to demonstrate self-control will improve Outcome: Progressing   

## 2023-11-27 NOTE — Group Note (Signed)
 Date:  11/27/2023 Time:  8:41 PM  Group Topic/Focus:  Wrap-Up Group:   The focus of this group is to help patients review their daily goal of treatment and discuss progress on daily workbooks.    Participation Level:  Did Not Attend   Lanayah Gartley Dacosta 11/27/2023, 8:41 PM

## 2023-11-27 NOTE — Progress Notes (Signed)
 [facility] MD Progress Note  11/27/2023 11:30 AM Lauren Poole  MRN:  [id number] Subjective:   33 year old African-American female, single, recently unemployed, lives alone.  Background history of trauma, PTSD and MDD single episode.  Patient was recently discharged from our unit.  Last hospitalization was in context of serious self-injurious suicidal attempts.  Current presentation was via the police.  She had called the police to take her dog away.  She expressed thoughts of drinking bleach. Routine labs are essentially normal.  No alcohol or any psychoactive substance on board.  Chart reviewed today.  Patient discussed at multidisciplinary team meeting.  Nursing staff reports that patient slept well.  She appeared to regress a bit yesterday.  She was picking on her old scar.  She has been taking her medications as recommended.  No PRNs required.  No observed response to internal stimuli.  No collateral yet from family.  Seen today.  Took a while to engage today.  Expressed disappointment about still being here.  States that she feels like people are judging her.  States that her goal is to go home soon and use the time between now and end of the month to organize herself.  States that she does not want to talk about her feelings today.  Dismissed any suicidal thoughts.  Dismissed any homicidal thoughts.  Dismissed any thoughts of violence.  I explored optimization of her SSRI, she wants to stay on her current dose.  There are no psychotic symptoms.  There are no manic symptoms. Encouraged to authorize collateral from her family.  Principal Problem: MDD (major depressive disorder), single episode, severe with psychosis (HCC) Diagnosis: Principal Problem:   MDD (major depressive disorder), single episode, severe with psychosis (HCC)  Total Time spent with patient: 30 minutes  Past Psychiatric History:  See H&P  Past Medical History:  Past Medical History:  Diagnosis Date   Chlamydia     Gonorrhea     Past Surgical History:  Procedure Laterality Date   NO PAST SURGERIES     Family History:  Family History  Problem Relation Age of Onset   Schizophrenia Sister    Family Psychiatric  History:  See H&P  Social History:  Social History   Substance and Sexual Activity  Alcohol Use Yes   Alcohol/week: 2.0 standard drinks of alcohol   Types: 2 Glasses of wine per week     Social History   Substance and Sexual Activity  Drug Use Yes   Types: Marijuana    Social History   Socioeconomic History   Marital status: Single    Spouse name: Not on file   Number of children: Not on file   Years of education: Not on file   Highest education level: Not on file  Occupational History   Not on file  Tobacco Use   Smoking status: Light Smoker    Current packs/day: 0.50    Average packs/day: 0.5 packs/day for 6.1 years (3.1 ttl pk-yrs)    Types: Cigarettes    Start date: 10/2017   Smokeless tobacco: Never  Vaping Use   Vaping status: Never Used  Substance and Sexual Activity   Alcohol use: Yes    Alcohol/week: 2.0 standard drinks of alcohol    Types: 2 Glasses of wine per week   Drug use: Yes    Types: Marijuana   Sexual activity: Yes    Birth control/protection: None  Other Topics Concern   Not on file  Social History Narrative  Not on file   Social Drivers of Health   Financial Resource Strain: Not on file  Food Insecurity: No Food Insecurity (11/22/2023)   Hunger Vital Sign    Worried About Running Out of Food in the Last Year: Never true    Ran Out of Food in the Last Year: Never true  Transportation Needs: No Transportation Needs (11/22/2023)   PRAPARE - Administrator, Civil Service (Medical): No    Lack of Transportation (Non-Medical): No  Physical Activity: Not on file  Stress: Not on file  Social Connections: Not on file   Additional Social History:   Current Medications: Current Facility-Administered Medications  Medication  Dose Route Frequency Provider Last Rate Last Admin   acetaminophen (TYLENOL) tablet 650 mg  650 mg Oral Q6H PRN Onuoha, Josephine C, NP   650 mg at 11/26/23 1359   alum & mag hydroxide-simeth (MAALOX/MYLANTA) 200-200-20 MG/5ML suspension 30 mL  30 mL Oral Q4H PRN Onuoha, Josephine C, NP       haloperidol (HALDOL) tablet 5 mg  5 mg Oral TID PRN Onuoha, Josephine C, NP       And   diphenhydrAMINE (BENADRYL) capsule 50 mg  50 mg Oral TID PRN Onuoha, Josephine C, NP       haloperidol lactate (HALDOL) injection 5 mg  5 mg Intramuscular TID PRN Onuoha, Josephine C, NP       And   diphenhydrAMINE (BENADRYL) injection 50 mg  50 mg Intramuscular TID PRN Onuoha, Josephine C, NP       And   LORazepam (ATIVAN) injection 2 mg  2 mg Intramuscular TID PRN Onuoha, Josephine C, NP       haloperidol lactate (HALDOL) injection 10 mg  10 mg Intramuscular TID PRN Onuoha, Josephine C, NP       And   diphenhydrAMINE (BENADRYL) injection 50 mg  50 mg Intramuscular TID PRN Onuoha, Josephine C, NP       And   LORazepam (ATIVAN) injection 2 mg  2 mg Intramuscular TID PRN Onuoha, Josephine C, NP       escitalopram (LEXAPRO) tablet 5 mg  5 mg Oral QHS Meaghan Whistler A, MD   5 mg at 11/26/23 2054   hydrOXYzine (ATARAX) tablet 25 mg  25 mg Oral TID PRN Onuoha, Josephine C, NP   25 mg at 11/26/23 2054   nicotine (NICODERM CQ - dosed in mg/24 hours) patch 14 mg  14 mg Transdermal Daily Attiah, Nadir, MD   14 mg at 11/25/23 0802   nicotine polacrilex (NICORETTE) gum 2 mg  2 mg Oral PRN Nkwenti, Doris, NP   2 mg at 11/26/23 1114   traZODone (DESYREL) tablet 50 mg  50 mg Oral QHS PRN Onuoha, Josephine C, NP   50 mg at 11/26/23 2054    Lab Results: No results found for this or any previous visit (from the past 48 hours).  Blood Alcohol level:  Lab Results  Component Value Date   ETH <10 11/21/2023    Metabolic Disorder Labs: Lab Results  Component Value Date   HGBA1C 5.1 10/31/2023   MPG 99.67 10/31/2023   No  results found for: "PROLACTIN" Lab Results  Component Value Date   CHOL 136 10/31/2023   TRIG 75 10/31/2023   HDL 37 (L) 10/31/2023   CHOLHDL 3.7 10/31/2023   VLDL 15 10/31/2023   LDLCALC 84 10/31/2023    Physical Findings: AIMS:  , ,  ,  ,  CIWA:    COWS:     Musculoskeletal: Strength & Muscle Tone: within normal limits Gait & Station: normal Patient leans: N/A  Psychiatric Specialty Exam:  Presentation  General Appearance:  Casually dressed, not in any distress, appropriate behavior.  No EPS.  Eye Contact: Good.  Speech: Spontaneous.  Normal rate, tone and volume.  Mood and Affect  Mood: Slightly worried,  Affect: Restricted and appropriate.  Thought Process  Thought Processes: Linear and goal directed.  Descriptions of Associations:Intact  Orientation:Full (Time, Place and Person)  Thought Content: Negative ruminations about her future.  No guilty ruminations.  No current suicidal thoughts.  No homicidal thoughts.  No thoughts of violence.  No delusional theme.  No obsessions.  Hallucinations: No hallucination in any modality.  Sensorium  Memory: Good.  Judgment: Good.  Insight: Good  Executive Functions  Concentration: Good.  Attention Span: Good.  Recall: Good.  Fund of Knowledge: Good.  Language: Good   Psychomotor Activity  Normal psychomotor activity    Physical Exam: Physical Exam ROS Blood pressure (!) 116/94, pulse 77, temperature 98.5 F (36.9 C), temperature source Oral, resp. rate 18, height 5\' 4"  (1.626 m), weight 111.6 kg, SpO2 99%. Body mass index is 42.23 kg/m.   Treatment Plan Summary: Patient has slightly regressed because she is not being discharged this weekend.  She is not keen on optimization of her SSRI.  We will keep her medication the same and get feedback/collateral from family.  1.  Escitalopram 5 mg at bedtime. 2.  Continue to encourage unit groups and therapeutic activities. 3.   Continue to monitor mood behavior and interaction with others. 4.  Social worker will get collateral from her mother or her sister. 5.  Social worker will coordinate discharge and aftercare planning.    Amelie Jury, MD 11/27/2023, 11:30 AM

## 2023-11-28 NOTE — Plan of Care (Signed)
  Problem: Education: Goal: Verbalization of understanding the information provided will improve Outcome: Progressing   Problem: Activity: Goal: Interest or engagement in activities will improve Outcome: Progressing Goal: Sleeping patterns will improve Outcome: Progressing   

## 2023-11-28 NOTE — Progress Notes (Signed)
 Patient A/O x 4, ambulatory. She received her HS medications as prescribed yet was hesitant on taking her Lexapro 5 mg PO. She states she would like to start taking it during the day as it interrupts her sleep at night.

## 2023-11-28 NOTE — Progress Notes (Signed)
   11/28/23 0000  Psych Admission Type (Psych Patients Only)  Admission Status Involuntary  Psychosocial Assessment  Patient Complaints Depression  Eye Contact Fair  Facial Expression Flat  Affect Depressed  Speech Logical/coherent  Interaction Assertive  Motor Activity Other (Comment) (WNL)  Appearance/Hygiene Unremarkable  Behavior Characteristics Cooperative  Mood Depressed;Anxious;Despair  Thought Process  Coherency Circumstantial  Content WDL  Delusions None reported or observed  Perception WDL  Hallucination None reported or observed  Judgment WDL  Confusion None  Danger to Self  Current suicidal ideation? Denies  Agreement Not to Harm Self Yes  Description of Agreement Verbal Contract  Danger to Others  Danger to Others None reported or observed

## 2023-11-28 NOTE — Group Note (Signed)
 Date:  11/28/2023 Time:  8:31 PM  Group Topic/Focus:  Wrap-Up Group:   The focus of this group is to help patients review their daily goal of treatment and discuss progress on daily workbooks.    Participation Level:  Did Not Attend  Lauren Poole 11/28/2023, 8:31 PM

## 2023-11-28 NOTE — Progress Notes (Signed)
   11/28/23 2000  Psych Admission Type (Psych Patients Only)  Admission Status Involuntary  Psychosocial Assessment  Patient Complaints Depression  Eye Contact Fair  Facial Expression Flat  Affect Appropriate to circumstance  Speech Logical/coherent  Interaction Assertive  Motor Activity Other (Comment) (WNL)  Appearance/Hygiene Unremarkable  Behavior Characteristics Cooperative  Mood Depressed;Sullen  Thought Process  Coherency Circumstantial  Content WDL  Delusions None reported or observed  Perception WDL  Hallucination None reported or observed  Judgment WDL  Confusion None  Danger to Self  Current suicidal ideation? Denies  Agreement Not to Harm Self Yes  Description of Agreement Verbal Contract  Danger to Others  Danger to Others None reported or observed

## 2023-11-28 NOTE — Plan of Care (Signed)
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
## 2023-11-29 ENCOUNTER — Encounter (HOSPITAL_COMMUNITY): Payer: Self-pay

## 2023-11-29 DIAGNOSIS — F323 Major depressive disorder, single episode, severe with psychotic features: Secondary | ICD-10-CM | POA: Diagnosis not present

## 2023-11-29 MED ORDER — VITAMIN D3 25 MCG PO TABS
2000.0000 [IU] | ORAL_TABLET | Freq: Two times a day (BID) | ORAL | Status: DC
  Administered 2023-11-30: 2000 [IU] via ORAL
  Filled 2023-11-29 (×5): qty 2

## 2023-11-29 MED ORDER — VITAMIN D (ERGOCALCIFEROL) 1.25 MG (50000 UNIT) PO CAPS
50000.0000 [IU] | ORAL_CAPSULE | ORAL | Status: DC
  Administered 2023-11-29: 50000 [IU] via ORAL
  Filled 2023-11-29: qty 1

## 2023-11-29 NOTE — Group Note (Signed)
 Date:  11/29/2023 Time:  8:15 PM  Group Topic/Focus:  Wrap-Up Group:   The focus of this group is to help patients review their daily goal of treatment and discuss progress on daily workbooks.    Participation Level:  Active  Participation Quality:  Appropriate  Affect:  Appropriate  Cognitive:  Appropriate  Insight: Appropriate  Engagement in Group:  Engaged  Modes of Intervention:  Education and Exploration  Additional Comments:  Patient attended and participated in group tonight.  She reports that her goal today was to get some rest.  She did get rest today  Lauren Poole 11/29/2023, 8:15 PM

## 2023-11-29 NOTE — Plan of Care (Signed)
   Problem: Education: Goal: Emotional status will improve Outcome: Progressing Goal: Mental status will improve Outcome: Progressing Goal: Verbalization of understanding the information provided will improve Outcome: Progressing

## 2023-11-29 NOTE — Group Note (Signed)
 LCSW Group Therapy Note   Group Date: 11/29/2023 Start Time: 1100 End Time: 1200   Participation:  patient was present and actively participated in the discussion.  Type of Therapy:  Group Therapy  Topic:  Stronger Together:  Building Health Relationships  Objective:  to explore loneliness, boundaries, and safe ways to build relationships.  Goals: Recognize healthy vs. unhealthy relationships. Learn safe ways to connect with others. Strengthen communication and [employer].  Summary:  Participants discussed loneliness, healthy connections, and setting boundaries. They explored safe ways to meet people and shared personal experiences. Key insights were reinforced through discussion and quotes.  Therapeutic Modalities Used: Cognitive Behavioral Therapy (CBT) Elements - Identifying unhealthy relationship patterns, challenging negative thoughts about connection. Dialectical Behavior Therapy (DBT) Elements - Interpersonal effectiveness, setting and maintaining boundaries. Supportive Group Therapy - Peer discussion, shared experiences, and emotional validation.   Rebekkah Powless O Nykerria Macconnell, LCSWA 11/29/2023  5:14 PM

## 2023-11-29 NOTE — Progress Notes (Signed)
 Collateral contact - [email or web address]  Patient informed that she has a court hearing tomorrow, 4/15 at 8 AM.  Per patient's request, CSW emailed a letter via secure email.    Case: [id number], [place]   Kajol Crispen Sheffield, LCSWA 11/29/2023

## 2023-11-29 NOTE — B[facility] Group Notes (Signed)
 Adult Psychoeducational Group Note  Date:  11/29/2023 Time:  9:58 AM  Group Topic/Focus:  Goals Group:   The focus of this group is to help patients establish daily goals to achieve during treatment and discuss how the patient can incorporate goal setting into their daily lives to aide in recovery.  Participation Level:  Did Not Attend  Participation Quality:  na  Affect:  na  Cognitive:  na  Insight: na  Engagement in Group:  na  Modes of Intervention:  na  Additional Comments:  Pt did not attend group  Alean Amen 11/29/2023, 9:58 AM

## 2023-11-29 NOTE — Group Note (Signed)
 Recreation Therapy Group Note   Group Topic:Coping Skills  Group Date: 11/29/2023 Start Time: 1020 End Time: 1050 Facilitators: Navil Kole-McCall, LRT,CTRS Location: [address] Dayroom   Group Topic: Coping Skills   Goal Area(s) Addresses: Patient will define what a coping skill is. Patient will create a list of healthy coping skills beginning with each letter of the alphabet. Patient will successfully identify positive coping skills they can use post d/c.  Patient will acknowledge benefit(s) of using learned coping skills post d/c.  Intervention: Group work   Activity: Coping A to Z. Patient asked to identify what a coping skill is and when they use them. Patients with Clinical research associate discussed healthy versus unhealthy coping skills. Next patients were given a blank worksheet titled "Coping Skills A-Z". Patients were instructed to come up with at least one positive coping skill per letter of the alphabet, addressing a specific challenge (ex: stress, anger, anxiety, depression, grief, doubt, isolation, self-harm/suicidal thoughts, substance use). Patients were given 15 minutes to brainstorm, before ideas were presented to the large group. Patients and LRT debriefed on the importance of coping skill selection based on situation and back-up plans when a skill tried is not effective. At the end of group, patients were given an handout of alphabetized strategies to keep for future reference.   Education: Pharmacologist, Scientist, physiological, Discharge Planning.    Education Outcome: Acknowledges education/Verbalizes understanding/In group clarification offered/Additional education needed   Affect/Mood: Appropriate   Participation Level: Engaged   Participation Quality: Independent   Behavior: Appropriate   Speech/Thought Process: Focused   Insight: Good   Judgement: Good   Modes of Intervention: Worksheet   Patient Response to Interventions:  Engaged   Education Outcome:  In group  clarification offered    Clinical Observations/Individualized Feedback: Pt was engaged and bright during group session. Pt had to the challenge of "self-care". Pt expressed self-care was very much her and a good topic for her. Pt identified six of her top coping skills for self-care as zen, sex, dump old stuff, jazz, love and beauty. Pt was into coming up with her coping skills. Pt was also receptive to some new coping skills presented by peer that she could try, such as fishing. Pt became more intrigued as peer explained the environment and setting while out fishing.      Plan: Continue to engage patient in RT group sessions 2-3x/week.   Malan Werk-McCall, LRT,CTRS  11/29/2023 1:49 PM

## 2023-11-29 NOTE — Progress Notes (Signed)
 [facility] MD Progress Note  11/29/2023 2:15 PM Lauren Poole  MRN:  [id number] Subjective:   33 year old African-American female, single, recently unemployed, lives alone.  Background history of trauma, PTSD and MDD single episode.  Patient was recently discharged from our unit.  Last hospitalization was in context of serious self-injurious suicidal attempts.  Current presentation was via the police.  She had called the police to take her dog away.  She expressed thoughts of drinking bleach. Routine labs are essentially normal.  No alcohol or any psychoactive substance on board.  Chart reviewed today.  Patient discussed at multidisciplinary team meeting.  No acute events occurred overnight.  She required new PRNs for agitation.  No collateral yet from family.  Lauren Poole was seen in her room during rounds.  She reported that her mood was "better" today.  She denies any new psychiatric or medical complaints.  She reports that her appetite is good.  She reports that focus and concentration are adequate.  She denies issues with energy.  She reports adequate sleep.  She denies any medication side effects.  She denies suicidal ideations, homicidal ideations, auditory hallucinations, visual hallucinations, or delusions.  We discussed increasing Lexapro as 5 mg is likely not an adequate dose to control her symptoms.  She states that she does not want to increase Lexapro at this time however.  If the patient continues to improve we will plan on discharging tomorrow.   Principal Problem: MDD (major depressive disorder), single episode, severe with psychosis (HCC) Diagnosis: Principal Problem:   MDD (major depressive disorder), single episode, severe with psychosis (HCC)  Total Time spent with patient: 30 minutes  Past Psychiatric History:  See H&P  Past Medical History:  Past Medical History:  Diagnosis Date   Chlamydia    Gonorrhea     Past Surgical History:  Procedure Laterality Date   NO  PAST SURGERIES     Family History:  Family History  Problem Relation Age of Onset   Schizophrenia Sister    Family Psychiatric  History:  See H&P  Social History:  Social History   Substance and Sexual Activity  Alcohol Use Yes   Alcohol/week: 2.0 standard drinks of alcohol   Types: 2 Glasses of wine per week     Social History   Substance and Sexual Activity  Drug Use Yes   Types: Marijuana    Social History   Socioeconomic History   Marital status: Single    Spouse name: Not on file   Number of children: Not on file   Years of education: Not on file   Highest education level: Not on file  Occupational History   Not on file  Tobacco Use   Smoking status: Light Smoker    Current packs/day: 0.50    Average packs/day: 0.5 packs/day for 6.1 years (3.1 ttl pk-yrs)    Types: Cigarettes    Start date: 10/2017   Smokeless tobacco: Never  Vaping Use   Vaping status: Never Used  Substance and Sexual Activity   Alcohol use: Yes    Alcohol/week: 2.0 standard drinks of alcohol    Types: 2 Glasses of wine per week   Drug use: Yes    Types: Marijuana   Sexual activity: Yes    Birth control/protection: None  Other Topics Concern   Not on file  Social History Narrative   Not on file   Social Drivers of Health   Financial Resource Strain: Not on file  Food Insecurity: No Food Insecurity (  11/22/2023)   Hunger Vital Sign    Worried About Running Out of Food in the Last Year: Never true    Ran Out of Food in the Last Year: Never true  Transportation Needs: No Transportation Needs (11/22/2023)   PRAPARE - Administrator, Civil Service (Medical): No    Lack of Transportation (Non-Medical): No  Physical Activity: Not on file  Stress: Not on file  Social Connections: Not on file   Additional Social History:   Current Medications: Current Facility-Administered Medications  Medication Dose Route Frequency Provider Last Rate Last Admin   acetaminophen (TYLENOL)  tablet 650 mg  650 mg Oral Q6H PRN Dahlia Byes C, NP   650 mg at 11/26/23 1359   alum & mag hydroxide-simeth (MAALOX/MYLANTA) 200-200-20 MG/5ML suspension 30 mL  30 mL Oral Q4H PRN Onuoha, Josephine C, NP       haloperidol (HALDOL) tablet 5 mg  5 mg Oral TID PRN Dahlia Byes C, NP       And   diphenhydrAMINE (BENADRYL) capsule 50 mg  50 mg Oral TID PRN Dahlia Byes C, NP       haloperidol lactate (HALDOL) injection 5 mg  5 mg Intramuscular TID PRN Dahlia Byes C, NP       And   diphenhydrAMINE (BENADRYL) injection 50 mg  50 mg Intramuscular TID PRN Dahlia Byes C, NP       And   LORazepam (ATIVAN) injection 2 mg  2 mg Intramuscular TID PRN Dahlia Byes C, NP       haloperidol lactate (HALDOL) injection 10 mg  10 mg Intramuscular TID PRN Dahlia Byes C, NP       And   diphenhydrAMINE (BENADRYL) injection 50 mg  50 mg Intramuscular TID PRN Dahlia Byes C, NP       And   LORazepam (ATIVAN) injection 2 mg  2 mg Intramuscular TID PRN Dahlia Byes C, NP       escitalopram (LEXAPRO) tablet 5 mg  5 mg Oral QHS Izediuno, Vincent A, MD   5 mg at 11/28/23 2156   hydrOXYzine (ATARAX) tablet 25 mg  25 mg Oral TID PRN Dahlia Byes C, NP   25 mg at 11/28/23 2158   nicotine (NICODERM CQ - dosed in mg/24 hours) patch 14 mg  14 mg Transdermal Daily Attiah, Nadir, MD   14 mg at 11/25/23 0802   nicotine polacrilex (NICORETTE) gum 2 mg  2 mg Oral PRN Starleen Blue, NP   2 mg at 11/26/23 1114   traZODone (DESYREL) tablet 50 mg  50 mg Oral QHS PRN Dahlia Byes C, NP   50 mg at 11/28/23 2157   Vitamin D (Ergocalciferol) (DRISDOL) 1.25 MG (50000 UNIT) capsule 50,000 Units  50,000 Units Oral Q7 days Golda Acre, MD   50,000 Units at 11/29/23 1056   [START ON 11/30/2023] vitamin D3 (CHOLECALCIFEROL) tablet 2,000 Units  2,000 Units Oral BID Golda Acre, MD        Lab Results: No results found for this or any previous visit (from the past 48 hours).  Blood  Alcohol level:  Lab Results  Component Value Date   ETH <10 11/21/2023    Metabolic Disorder Labs: Lab Results  Component Value Date   HGBA1C 5.1 10/31/2023   MPG 99.67 10/31/2023   No results found for: "PROLACTIN" Lab Results  Component Value Date   CHOL 136 10/31/2023   TRIG 75 10/31/2023   HDL 37 (L) 10/31/2023  CHOLHDL 3.7 10/31/2023   VLDL 15 10/31/2023   LDLCALC 84 10/31/2023    Physical Findings: AIMS:  , ,  ,  ,    CIWA:    COWS:     Musculoskeletal: Strength & Muscle Tone: within normal limits Gait & Station: normal Patient leans: N/A  Psychiatric Specialty Exam:  Presentation  General Appearance:  Casually dressed, not in any distress, appropriate behavior.  No EPS.  Eye Contact: Good.  Speech: Spontaneous.  Normal rate, tone and volume.  Mood and Affect  Mood: Not pervasively down.  Affect: Restricted and appropriate.  Thought Process  Thought Processes: Linear and goal directed.  Descriptions of Associations:Intact  Orientation:Full (Time, Place and Person)  Thought Content: Negative ruminations about her future.  No guilty ruminations.  No current suicidal thoughts.  No homicidal thoughts.  No thoughts of violence.  No delusional theme.  No obsessions.  Hallucinations: No hallucination in any modality.  Sensorium  Memory: Good.  Judgment: Good.  Insight: Good  Executive Functions  Concentration: Good.  Attention Span: Good.  Recall: Good.  Fund of Knowledge: Good.  Language: Good   Psychomotor Activity  Normal psychomotor activity    Physical Exam: Physical Exam ROS Blood pressure 120/80, pulse 72, temperature 98.4 F (36.9 C), temperature source Oral, resp. rate 16, height 5\' 4"  (1.626 m), weight 111.6 kg, SpO2 98%. Body mass index is 42.23 kg/m.   Treatment Plan Summary: This is patient's second hospitalization in less than a month.  She recently lost her job and does not have any income.   Patient's first hospitalization was in context of serious self-injurious behavior.  She called the police this time around to give her dog away and threatened to drink bleach.  We started her on escitalopram which she has tolerated well.  She has been hesitant to optimize the dose.  We do not have a collateral from family yet.  1.  Escitalopram 5 mg at bedtime. 2.  Continue to encourage unit groups and therapeutic activities. 3.  Continue to monitor mood behavior and interaction with others. 4.  Social worker will get collateral from her mother or her sister. 5.  Social worker will coordinate discharge and aftercare planning.    Timmothy Foots, MD 11/29/2023, 2:15 PM

## 2023-11-30 DIAGNOSIS — F323 Major depressive disorder, single episode, severe with psychotic features: Secondary | ICD-10-CM | POA: Diagnosis not present

## 2023-11-30 MED ORDER — CHOLECALCIFEROL 125 MCG (5000 UT) PO TABS: 1.0000 | ORAL_TABLET | Freq: Every day | ORAL | 0 refills | Status: DC

## 2023-11-30 MED ORDER — VITAMIN D (ERGOCALCIFEROL) 1.25 MG (50000 UNIT) PO CAPS: 50000.0000 [IU] | ORAL_CAPSULE | ORAL | 0 refills | Status: DC

## 2023-11-30 MED ORDER — VITAMIN D-3 125 MCG (5000 UT) PO TABS
1.0000 | ORAL_TABLET | Freq: Every day | ORAL | Status: DC
  Filled 2023-11-30 (×2): qty 10

## 2023-11-30 MED ORDER — VITAMIN D-3 125 MCG (5000 UT) PO TABS: 1.0000 | ORAL_TABLET | Freq: Every day | ORAL | 0 refills | Status: DC

## 2023-11-30 MED ORDER — ESCITALOPRAM OXALATE 5 MG PO TABS: 5.0000 mg | ORAL_TABLET | Freq: Every day | ORAL | 0 refills | Status: DC
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
## 2023-11-30 NOTE — Progress Notes (Signed)

## 2023-11-30 NOTE — BH IP Treatment Plan (Signed)
 Interdisciplinary Treatment and Diagnostic Plan Update  11/29/2023 Time of Session: 1530 - UPDATE Lauren Poole MRN: [id number]  Principal Diagnosis: MDD (major depressive disorder), single episode, severe with psychosis (HCC)  Secondary Diagnoses: Principal Problem:   MDD (major depressive disorder), single episode, severe with psychosis (HCC)   Current Medications:  Current Facility-Administered Medications  Medication Dose Route Frequency Provider Last Rate Last Admin   acetaminophen (TYLENOL) tablet 650 mg  650 mg Oral Q6H PRN Onuoha, Josephine C, NP   650 mg at 11/26/23 1359   alum & mag hydroxide-simeth (MAALOX/MYLANTA) 200-200-20 MG/5ML suspension 30 mL  30 mL Oral Q4H PRN Onuoha, Josephine C, NP       haloperidol (HALDOL) tablet 5 mg  5 mg Oral TID PRN Onuoha, Josephine C, NP       And   diphenhydrAMINE (BENADRYL) capsule 50 mg  50 mg Oral TID PRN Onuoha, Josephine C, NP       haloperidol lactate (HALDOL) injection 5 mg  5 mg Intramuscular TID PRN Onuoha, Josephine C, NP       And   diphenhydrAMINE (BENADRYL) injection 50 mg  50 mg Intramuscular TID PRN Onuoha, Josephine C, NP       And   LORazepam (ATIVAN) injection 2 mg  2 mg Intramuscular TID PRN Onuoha, Josephine C, NP       haloperidol lactate (HALDOL) injection 10 mg  10 mg Intramuscular TID PRN Onuoha, Josephine C, NP       And   diphenhydrAMINE (BENADRYL) injection 50 mg  50 mg Intramuscular TID PRN Onuoha, Josephine C, NP       And   LORazepam (ATIVAN) injection 2 mg  2 mg Intramuscular TID PRN Onuoha, Josephine C, NP       escitalopram (LEXAPRO) tablet 5 mg  5 mg Oral QHS Izediuno, Vincent A, MD   5 mg at 11/29/23 2021   hydrOXYzine (ATARAX) tablet 25 mg  25 mg Oral TID PRN Onuoha, Josephine C, NP   25 mg at 11/29/23 2020   nicotine (NICODERM CQ - dosed in mg/24 hours) patch 14 mg  14 mg Transdermal Daily Attiah, Nadir, MD   14 mg at 11/25/23 0802   nicotine polacrilex (NICORETTE) gum 2 mg  2 mg Oral PRN  Nkwenti, Doris, NP   2 mg at 11/26/23 1114   traZODone (DESYREL) tablet 50 mg  50 mg Oral QHS PRN Onuoha, Josephine C, NP   50 mg at 11/29/23 2020   Vitamin D (Ergocalciferol) (DRISDOL) 1.25 MG (50000 UNIT) capsule 50,000 Units  50,000 Units Oral Q7 days Parker, Alvin S, MD   50,000 Units at 11/29/23 1056   vitamin D3 (CHOLECALCIFEROL) tablet 2,000 Units  2,000 Units Oral BID Parker, Alvin S, MD   2,000 Units at 11/30/23 0807   PTA Medications: Medications Prior to Admission  Medication Sig Dispense Refill Last Dose/Taking   OLANZapine zydis (ZYPREXA) 10 MG disintegrating tablet Take 1 tablet (10 mg total) by mouth 2 (two) times daily. For mood control 60 tablet 0    traZODone (DESYREL) 50 MG tablet Take 1 tablet (50 mg total) by mouth at bedtime. For sleep. (Patient taking differently: Take 50 mg by mouth at bedtime as needed. For sleep.) 30 tablet 0     Patient Stressors: Financial difficulties   Medication change or noncompliance    Patient Strengths: General fund of knowledge  Supportive family/friends   Treatment Modalities: Medication Management, Group therapy, Case management,  1 to 1 session with clinician,  Psychoeducation, Recreational therapy.   Physician Treatment Plan for Primary Diagnosis: MDD (major depressive disorder), single episode, severe with psychosis (HCC) Long Term Goal(s): Improvement in symptoms so as ready for discharge   Short Term Goals: Ability to identify changes in lifestyle to reduce recurrence of condition will improve  Medication Management: Evaluate patient's response, side effects, and tolerance of medication regimen.  Therapeutic Interventions: 1 to 1 sessions, Unit Group sessions and Medication administration.  Evaluation of Outcomes: Adequate for Discharge  Physician Treatment Plan for Secondary Diagnosis: Principal Problem:   MDD (major depressive disorder), single episode, severe with psychosis (HCC)  Long Term Goal(s): Improvement in  symptoms so as ready for discharge   Short Term Goals: Ability to identify changes in lifestyle to reduce recurrence of condition will improve     Medication Management: Evaluate patient's response, side effects, and tolerance of medication regimen.  Therapeutic Interventions: 1 to 1 sessions, Unit Group sessions and Medication administration.  Evaluation of Outcomes: Adequate for Discharge   RN Treatment Plan for Primary Diagnosis: MDD (major depressive disorder), single episode, severe with psychosis (HCC) Long Term Goal(s): Knowledge of disease and therapeutic regimen to maintain health will improve  Short Term Goals: Ability to remain free from injury will improve, Ability to participate in decision making will improve, and Ability to disclose and discuss suicidal ideas  Medication Management: RN will administer medications as ordered by provider, will assess and evaluate patient's response and provide education to patient for prescribed medication. RN will report any adverse and/or side effects to prescribing provider.  Therapeutic Interventions: 1 on 1 counseling sessions, Psychoeducation, Medication administration, Evaluate responses to treatment, Monitor vital signs and CBGs as ordered, Perform/monitor CIWA, COWS, AIMS and Fall Risk screenings as ordered, Perform wound care treatments as ordered.  Evaluation of Outcomes: Adequate for Discharge   LCSW Treatment Plan for Primary Diagnosis: MDD (major depressive disorder), single episode, severe with psychosis (HCC) Long Term Goal(s): Safe transition to appropriate next level of care at discharge, Engage patient in therapeutic group addressing interpersonal concerns.  Short Term Goals: Engage patient in aftercare planning with referrals and resources, Increase social support, Increase ability to appropriately verbalize feelings, and Increase emotional regulation  Therapeutic Interventions: Assess for all discharge needs, 1 to 1 time  with Social worker, Explore available resources and support systems, Assess for adequacy in community support network, Educate family and significant other(s) on suicide prevention, Complete Psychosocial Assessment, Interpersonal group therapy.  Evaluation of Outcomes: Adequate for Discharge   Progress in Treatment: Attending groups: No. Participating in groups: No. Taking medication as prescribed: Yes. Toleration medication: Yes. Family/Significant other contact made: Yes, contacted:  Mom Lynwood Sauers [phone] Patient understands diagnosis: Yes. Discussing patient identified problems/goals with staff: Yes. Medical problems stabilized or resolved: Yes. Denies suicidal/homicidal ideation: Yes. Issues/concerns per patient self-inventory: No.   New problem(s) identified: No, Describe:  none   New Short Term/Long Term Goal(s): medication stabilization, elimination of SI thoughts, development of comprehensive mental wellness plan.      Patient Goals:  "Resting my mind"   Discharge Plan or Barriers: Patient recently admitted. CSW will continue to follow and assess for appropriate referrals and possible discharge planning.      Reason for Continuation of Hospitalization: Anxiety Depression Medication stabilization   Estimated Length of Stay: 1-2 DAYS  Last 3 [place] Suicide Severity Risk Score: Flowsheet Row [facility] (Discharged[facility]  C-SSRS RISK CATEGORY High Risk High Risk High Risk       Last PHQ 2/9 Scores:     No data to display          Scribe for Treatment Team: Vyctoria Dickman N Enzio Buchler, LCSW 11/30/2023 9:56 AM

## 2023-11-30 NOTE — BHH Suicide Risk Assessment (Signed)
 [facility] Discharge Suicide Risk Assessment   Principal Problem: MDD (major depressive disorder), single episode, severe with psychosis (HCC)  Discharge Diagnoses: Principal Problem:   MDD (major depressive disorder), single episode, severe with psychosis (HCC)      Total Time spent with patient: 40  Musculoskeletal: Strength & Muscle Tone: within normal limits Gait & Station: normal Patient leans: N/A   Psychiatric Specialty Exam:  Presentation  General Appearance: Appropriate for Environment  Eye Contact: Good  Speech: Clear and Coherent; Normal Rate  Speech Volume: Normal  Handedness: Right   Mood and Affect  Mood: Euthymic  Affect: Congruent   Thought Process  Thought Processes: Linear  Descriptions of Associations: Intact  Orientation: Full (Time, Place and Person)  Thought Content: Logical  History of Schizophrenia/Schizoaffective disorder: No  Duration of Psychotic Symptoms: NA Hallucinations: Hallucinations: None  Ideas of Reference: None  Suicidal Thoughts: Suicidal Thoughts: No  Homicidal Thoughts: Homicidal Thoughts: No   Sensorium  Memory: Immediate Good  Judgment: Fair  Insight: Fair   Art therapist  Concentration: Good  Attention Span: Good  Recall: Good  Fund of Knowledge: Good  Language: Good   Psychomotor Activity  Psychomotor Activity: Psychomotor Activity: Normal   Assets  Assets: Communication Skills; Social Support; Housing; Leisure Time   Sleep  Sleep: Sleep: Good   Physical Exam: General: Sitting comfortably. NAD. HEENT: Normocephalic, atraumatic, MMM, EMOI Lungs: no increased work of breathing noted Heart: no cyanosis Abdomen: Non distended Musculoskeletal: FROM. No obvious deformities Skin: Warm, dry, intact. No rashes noted Neuro: No obvious focal deficits.  Gait and station are normal  Review of Systems  Constitutional: Negative.   HENT: Negative.    Eyes: Negative.   Respiratory:  Negative.    Cardiovascular: Negative.   Gastrointestinal: Negative.   Genitourinary: Negative.   Skin: Negative.   Neurological: Negative.   Psychiatric/Behavioral:  Negative   Mental Status Per Nursing Assessment: NA  Demographic Factors:  Living alone  Loss Factors: NA  Historical Factors: Family history of mental illness or substance abuse  Risk Reduction Factors:   Sense of responsibility to family, Employed, and Positive coping skills or problem solving skills  Continued Clinical Symptoms:  Previous psychiatric diagnoses and treatments  Cognitive Features That Contribute To Risk:  None  Suicide Risk:  Minimal: No identifiable suicidal ideation.  Patients presenting with no risk factors but with morbid ruminations; may be classified as minimal risk based on the severity of the depressive symptoms.    Follow-up Information     [facility] Go on 12/06/2023.   Specialty: Professional Counselor Why: Please go to this provider on 12/06/23 at 9:00 am for therapy services.  You may also go on Monday through Friday, from 9 am to 1 pm. Contact information: [address] [place] [address] [phone]         [facility] Follow up.   Why: If you wish to schedule an appointment, please call to obtain the details for uninsured patients. Contact information: [address] [place] [address] [phone]         [facility]. Go on 12/07/2023.   Specialty: Behavioral Health Why: Please go to this provider on 12/07/23 at 7:00 am for medication management services. You may also go on Monday through Friday, arrive by 7:00 am for an assessment.  They accept patients without insurance. Contact information: [address] [place] [address] [phone]  Plan Of Care/Follow-up recommendations:  Activity: as tolerated  Diet: heart  healthy  Other: -Follow-up with your outpatient psychiatric provider -instructions on appointment date, time, and address (location) are provided to you in discharge paperwork.  -Take your psychiatric medications as prescribed at discharge - instructions are provided to you in the discharge paperwork  -Follow-up with outpatient primary care doctor and other specialists -for management of preventative medicine and chronic medical issues  -Testing: Follow-up with outpatient provider for any abnormal lab results (if any)  -If you are prescribed an atypical antipsychotic medication, we recommend that your outpatient psychiatrist follow routine screening for side effects within 3 months of discharge, including monitoring: AIMS scale, height, weight, blood pressure, fasting lipid panel, HbA1c, and fasting blood sugar.   -Recommend total abstinence from alcohol, tobacco, and other illicit drug use at discharge.   -If your psychiatric symptoms recur, worsen, or if you have side effects to your psychiatric medications, call your outpatient psychiatric provider, [phone] or go to the nearest emergency department.  -If suicidal thoughts occur, immediately call your outpatient psychiatric provider, 911, [phone] or go to the nearest emergency department.   Clair Crews, MD 11/30/23 12:07 PM

## 2023-11-30 NOTE — Progress Notes (Signed)
 Patient ID: Lauren Poole, female   DOB: 1990/08/24, 33 y.o.   MRN: [id number]  Patient discharged home. Denies SI/HI/AVH. Poole distress.

## 2023-11-30 NOTE — Progress Notes (Signed)
 Recreation Therapy Notes  INPATIENT RECREATION TR PLAN  Patient Details Name: Lauren Poole MRN: [id number] DOB: 12/14/1990 Today's Date: 11/30/2023  Rec Therapy Plan Is patient appropriate for Therapeutic Recreation?: Yes Treatment times per week: about 3 days Estimated Length of Stay: 5-7 days TR Treatment/Interventions: Group participation (Comment)  Discharge Criteria Pt will be discharged from therapy if:: Discharged Treatment plan/goals/alternatives discussed and agreed upon by:: Patient/family  Discharge Summary Short term goals set: See patient care plan Short term goals met: Complete Progress toward goals comments: Groups attended Which groups?: Coping skills, Other (Comment) (Team Building) Reason goals not met: None Therapeutic equipment acquired: N/A Reason patient discharged from therapy: Discharge from hospital Pt/family agrees with progress & goals achieved: Yes Date patient discharged from therapy: 11/30/23   [name], LRT,CTRS [name] [name] 11/30/2023, 1:22 PM

## 2023-11-30 NOTE — Plan of Care (Signed)

## 2023-11-30 NOTE — Progress Notes (Addendum)
  [facility] Adult Case Management Discharge Plan :  Will you be returning to the same living situation after discharge:  Yes,  patient will discharge to her boyfriend, Arlyce Lambert "[place]s Duck. At discharge, do you have transportation home?: Yes,  patient's boyfriend, Arlyce Lambert "[place] Duck, will pick her up at 1:30 PM Do you have the ability to pay for your medications: Patient doesn't have insurance so she will receive samples for 10 days.  Release of information consent forms completed and in the chart;  Patient's signature needed at discharge.  Patient to Follow up at:  Follow-up Information     [facility] Go on 12/06/2023.   Specialty: Professional Counselor Why: Please go to this provider on 12/06/23 at 9:00 am for therapy services.  You may also go on Monday through Friday, from 9 am to 1 pm. Contact information: [address] [place]  [address] [place] [address] [phone]         [facility] Follow up.   Why: If you wish to schedule an appointment, please call to obtain the details for uninsured patients. Contact information: [address] [place] [address] [phone]         [facility]. Go on 12/07/2023.   Specialty: Behavioral Health Why: Please go to this provider on 12/07/23 at 7:00 am for medication management services. You may also go on Monday through Friday, arrive by 7:00 am for an assessment.  They accept patients without insurance. Contact information: [address] [place]  [id number] [phone]                Next level of care provider has access to [facility] Link:no  Safety Planning and Suicide Prevention discussed: Yes,  Arlyce Lambert "Sam Creighton" (boyfriend) Marinda Si [phone]  Has patient been referred to the Quitline?: Patient refused referral for treatment.  Patient admitted to smoking, and declined a referral or print-out.  Patient has been referred  for addiction treatment: Patient refused referral for treatment.  Patient said that she quit using marijuana 3 months ago.  She said that she wouldn't need to speak with a therapist if she would experience cravings.   Lenda Baratta O Lynford Espinoza, LCSWA 11/30/2023, 9:37 AM

## 2023-11-30 NOTE — Plan of Care (Signed)
   Problem: Education: Goal: Knowledge of [facility] Education information/materials will improve Outcome: Progressing Goal: Emotional status will improve Outcome: Progressing Goal: Mental status will improve Outcome: Progressing Goal: Verbalization of understanding the information provided will improve Outcome: Progressing

## 2023-11-30 NOTE — Group Note (Signed)
 Recreation Therapy Group Note   Group Topic:Self-Esteem  Group Date: 11/30/2023 Start Time: 1010 End Time: 1030 Facilitators: Clarice Bonaventure-McCall, LRT,CTRS Location: [address] Day[address]   Group Topic: Self Esteem    Goal Area(s) Addresses:  Patient will appropriately identify what self esteem is.  Patient will create a shield of armor describing themselves.  Patient will successfully identify positive attributes about themselves.  Patient will acknowledge benefit of improved self-esteem.    Intervention / Activity: Self-Esteem Shield. Patient attended a recreation therapy group session focused on self esteem. Patient identified what self esteem is, and why it is important to have high self esteem during group discussion. Patient was asked to show off their unique attributes, the four quadrants reflected the following:   The Upper Left quadrant- 2 things or people you value The Upper Right quadrant- 2 lessons you've learned thus far in life The Lower Left quadrant- 3 qualities that make you unique The Lower Right quadrant- 1 goal you want to work towards    Patients were provided sheets with the shield printed on them and colored pencils, markers and crayons to complete the activity.  Patients and writer had group related discussions while individually working on their activity.  Patients were debriefed on the importance of healthy self esteem and offered a handout for ways to increase self esteem.    Education: Self esteem, Communication, Positive self-talk, Discharge Planning   Education Outcome: Acknowledges education/Verbalizes understanding of Education/In group clarification offered/Needs further education   Affect/Mood: N/A   Participation Level: Did not attend    Clinical Observations/Individualized Feedback:     Plan: Continue to engage patient in RT group sessions 2-3x/week.   Tyreanna Bisesi-McCall, LRT,CTRS 11/30/2023 11:36 AM

## 2023-11-30 NOTE — Plan of Care (Signed)
 Patient was able to identify more than 3 positive coping skills strategies to use post discharge with 5 recreation therapy group sessions.   Aris Even-McCall, LRT,CTRS

## 2023-12-02 ENCOUNTER — Emergency Department (HOSPITAL_COMMUNITY): Payer: Self-pay

## 2023-12-02 ENCOUNTER — Emergency Department (HOSPITAL_COMMUNITY)
Admission: EM | Admit: 2023-12-02 | Discharge: 2023-12-03 | Disposition: A | Discharge status: Home / Self Care | Payer: Self-pay | Attending: Emergency Medicine | Admitting: Emergency Medicine

## 2023-12-02 ENCOUNTER — Other Ambulatory Visit: Payer: Self-pay

## 2023-12-02 ENCOUNTER — Encounter (HOSPITAL_COMMUNITY): Payer: Self-pay

## 2023-12-02 DIAGNOSIS — Y92009 Unspecified place in unspecified non-institutional (private) residence as the place of occurrence of the external cause: Secondary | ICD-10-CM | POA: Insufficient documentation

## 2023-12-02 DIAGNOSIS — W19XXXA Unspecified fall, initial encounter: Secondary | ICD-10-CM | POA: Diagnosis not present

## 2023-12-02 DIAGNOSIS — M7989 Other specified soft tissue disorders: Secondary | ICD-10-CM | POA: Insufficient documentation

## 2023-12-02 DIAGNOSIS — S4991XA Unspecified injury of right shoulder and upper arm, initial encounter: Secondary | ICD-10-CM | POA: Diagnosis present

## 2023-12-02 DIAGNOSIS — S43014A Anterior dislocation of right humerus, initial encounter: Secondary | ICD-10-CM | POA: Diagnosis not present

## 2023-12-02 MED ORDER — OXYCODONE-ACETAMINOPHEN 5-325 MG PO TABS
1.0000 | ORAL_TABLET | Freq: Once | ORAL | Status: AC
  Administered 2023-12-02: 1 via ORAL
  Filled 2023-12-02: qty 1

## 2023-12-02 NOTE — ED Notes (Signed)
 Patient taken to xray at this time.

## 2023-12-02 NOTE — ED Triage Notes (Signed)
 Pt BIB [facility] from home c/o a fall. When EMS arrived Pt was sitting criss-cross apple sauce in the floor stating she could not get up because her knee was asleep. When they attempted to help her up she threw herself down onto her right shoulder. Pt was rude with EMS and told them they did not know what they were doing and her shoulder was broke. EMS did splint the arm but stated no deformities and pt was able to lift that arm and use that hand to use her phone then entire ride here.

## 2023-12-02 NOTE — ED Provider Triage Note (Signed)
 Emergency Medicine Provider Triage Evaluation Note  Jaqlyn Gruenhagen , a 33 y.o. female  was evaluated in triage.  Pt complains of R shoulder pain after falling when losing balance today. Fell directly on R shoulder. States she thinks it's dislocated. Denies head trauma or use of blood thinners. Also reports forearm pain..  Review of Systems  Positive: Shoulder pain Negative: Chest pain, sob  Physical Exam  BP (!) 145/97 (BP Location: Left Arm)   Pulse 92   Temp 98.5 F (36.9 C) (Oral)   Resp 18   Ht 5\' 4"  (1.626 m)   Wt 111.1 kg   SpO2 98%   BMI 42.05 kg/m  Gen:   Awake, no distress   Resp:  Normal effort  MSK:   +TTP of R humeral head and inferior aspect of R scapula as well as mid radius. No ttp of hand/wrist. +radial pulse. No obvious deformity.  Other:    Medical Decision Making  Medically screening exam initiated at 11:13 PM.  Appropriate orders placed.  Lakeesha Clark-Jackson was informed that the remainder of the evaluation will be completed by another provider, this initial triage assessment does not replace that evaluation, and the importance of remaining in the ED until their evaluation is complete.     Timmy Forbes, [place] 12/02/23 2314

## 2023-12-03 ENCOUNTER — Emergency Department (HOSPITAL_COMMUNITY): Payer: Self-pay

## 2023-12-03 MED ORDER — PROPOFOL 10 MG/ML IV BOLUS
0.5000 mg/kg | Freq: Once | INTRAVENOUS | Status: DC
  Filled 2023-12-03: qty 20

## 2023-12-03 MED ORDER — OXYCODONE-ACETAMINOPHEN 5-325 MG PO TABS
1.0000 | ORAL_TABLET | Freq: Once | ORAL | Status: AC
  Administered 2023-12-03: 1 via ORAL
  Filled 2023-12-03: qty 1

## 2023-12-03 MED ORDER — OXYCODONE HCL 5 MG PO TABS: 5.0000 mg | ORAL_TABLET | ORAL | 0 refills | Status: DC | PRN

## 2023-12-03 MED ORDER — FENTANYL CITRATE PF 50 MCG/ML IJ SOSY
100.0000 ug | PREFILLED_SYRINGE | Freq: Once | INTRAMUSCULAR | Status: AC
  Administered 2023-12-03: 100 ug via INTRAVENOUS
  Filled 2023-12-03: qty 2

## 2023-12-03 NOTE — ED Provider Notes (Signed)
 [place] EMERGENCY DEPARTMENT AT [facility] Provider Note   CSN: [id number] Arrival date & time: 12/02/23  2203     History  Chief Complaint  Patient presents with   Fall    Lauren Poole is a 33 y.o. female.  The history is provided by the patient.  Fall  She states that her knee buckled and she fell and injured her right shoulder.  She has a history of 2 prior shoulder dislocations.  She denies other injury.   Home Medications Prior to Admission medications   Medication Sig Start Date End Date Taking? Authorizing Provider  oxyCODONE  (ROXICODONE ) 5 MG immediate release tablet Take 1 tablet (5 mg total) by mouth every 4 (four) hours as needed for severe pain (pain score 7-10). 12/03/23  Yes Alissa April, MD  Cholecalciferol  (VITAMIN D -3) 125 MCG (5000 UT) TABS Take 1 tablet by mouth daily. 11/30/23   Timmothy Foots, MD  Cholecalciferol  125 MCG (5000 UT) TABS Take 1 tablet (5,000 Units total) by mouth daily. 11/30/23   Timmothy Foots, MD  escitalopram  (LEXAPRO ) 5 MG tablet Take 1 tablet (5 mg total) by mouth at bedtime. 11/30/23   Timmothy Foots, MD  traZODone  (DESYREL ) 50 MG tablet Take 1 tablet (50 mg total) by mouth at bedtime. For sleep. Patient taking differently: Take 50 mg by mouth at bedtime as needed. For sleep. 11/04/23   Asuncion Layer I, NP  Vitamin D , Ergocalciferol , (DRISDOL ) 1.25 MG (50000 UNIT) CAPS capsule Take 1 capsule (50,000 Units total) by mouth every 7 (seven) days. 12/06/23   Timmothy Foots, MD      Allergies    Patient has no known allergies.    Review of Systems   Review of Systems  All other systems reviewed and are negative.   Physical Exam Updated Vital Signs BP 102/66   Pulse 74   Temp 98.5 F (36.9 C) (Oral)   Resp 17   Ht 5\' 4"  (1.626 m)   Wt 111.1 kg   SpO2 100%   BMI 42.05 kg/m  Physical Exam Vitals and nursing note reviewed.   33 year old female, resting comfortably and in no acute distress. Vital signs are  significant for elevated blood pressure. Oxygen saturation is 98%, which is normal. Head is normocephalic and atraumatic. PERRLA, EOMI.  Neck is nontender and supple. Back is nontender. Lungs are clear without rales, wheezes, or rhonchi. Chest is nontender. Heart has regular rate and rhythm without murmur. Abdomen is soft, flat, nontender. Extremities: There is deformity of the right shoulder consistent with anterior dislocation.  Right shoulder has diffuse tenderness, there is pain with any movement.  There is a strong radial pulse, capillary refill is prompt, there is normal sensation.  No other extremity injuries seen. Skin is warm and dry without rash. Neurologic: Awake and alert.  Will not move the right arm because of pain but moves all other extremities equally.  ED Results / Procedures / Treatments   Labs (all labs ordered are listed, but only abnormal results are displayed) Labs Reviewed  PREGNANCY, URINE   Radiology DG Shoulder Right Portable Result Date: 12/03/2023 CLINICAL DATA:  Status post reduction EXAM: RIGHT SHOULDER - 1 VIEW COMPARISON:  Film from the previous day. FINDINGS: The humeral head is been reduced into the glenoid. No acute fracture is seen. No soft tissue changes are noted. IMPRESSION: Status post reduction. Electronically Signed   By: Violeta Grey M.D.   On: 12/03/2023 02:01  DG Shoulder Right Result Date: 12/02/2023 CLINICAL DATA:  Fall, severe shoulder pain EXAM: RIGHT SHOULDER - 2+ VIEW COMPARISON:  10/08/2022 FINDINGS: There is anterior dislocation of the right shoulder. No visible fracture. AC joint is intact. IMPRESSION: Anterior right shoulder dislocation. Electronically Signed   By: Janeece Mechanic M.D.   On: 12/02/2023 23:35    Procedures .Sedation  Date/Time: 12/03/2023 1:43 AM  Performed by: Alissa April, MD Authorized by: Alissa April, MD   Consent:    Consent obtained:  Verbal   Consent given by:  Patient   Risks discussed:  Allergic  reaction, dysrhythmia, inadequate sedation, nausea, prolonged hypoxia resulting in organ damage, prolonged sedation necessitating reversal, respiratory compromise necessitating ventilatory assistance and intubation and vomiting   Alternatives discussed:  Analgesia without sedation, anxiolysis and regional anesthesia Universal protocol:    Procedure explained and questions answered to patient or proxy's satisfaction: yes     Relevant documents present and verified: yes     Test results available: yes     Imaging studies available: yes     Required blood products, implants, devices, and special equipment available: yes     Site/side marked: yes     Immediately prior to procedure, a time out was called: yes     Patient identity confirmed:  Verbally with patient and arm band Indications:    Procedure performed:  Dislocation reduction   Procedure necessitating sedation performed by:  Physician performing sedation Pre-sedation assessment:    Time since last food or drink:  6 hours   ASA classification: class 1 - normal, healthy patient     Mouth opening:  3 or more finger widths   Thyromental distance:  4 finger widths   Mallampati score:  I - soft palate, uvula, fauces, pillars visible   Neck mobility: normal     Pre-sedation assessments completed and reviewed: airway patency, cardiovascular function, hydration status, mental status, nausea/vomiting, pain level, respiratory function and temperature   A pre-sedation assessment was completed prior to the start of the procedure Immediate pre-procedure details:    Reassessment: Patient reassessed immediately prior to procedure     Reviewed: vital signs, relevant labs/tests and NPO status     Verified: bag valve mask available, emergency equipment available, intubation equipment available, IV patency confirmed, oxygen available and suction available   Procedure details (see MAR for exact dosages):    Preoxygenation:  Nasal cannula   Sedation:   Propofol    Intended level of sedation: deep   Analgesia:  Fentanyl    Intra-procedure monitoring:  Blood pressure monitoring, cardiac monitor, continuous pulse oximetry, frequent LOC assessments, frequent vital sign checks and continuous capnometry   Intra-procedure events: none     Total Provider sedation time (minutes):  30 Post-procedure details:   A post-sedation assessment was completed following the completion of the procedure.   Attendance: Constant attendance by certified staff until patient recovered     Recovery: Patient returned to pre-procedure baseline     Post-sedation assessments completed and reviewed: airway patency, cardiovascular function, hydration status, mental status, nausea/vomiting, pain level, respiratory function and temperature     Patient is stable for discharge or admission: yes     Procedure completion:  Tolerated well, no immediate complications .Reduction of dislocation  Date/Time: 12/03/2023 1:43 AM  Performed by: Alissa April, MD Authorized by: Alissa April, MD  Consent: Written consent obtained. Risks and benefits: risks, benefits and alternatives were discussed Consent given by: patient Patient understanding: patient states understanding of the procedure being  performed Patient consent: the patient's understanding of the procedure matches consent given Procedure consent: procedure consent matches procedure scheduled Relevant documents: relevant documents present and verified Test results: test results available and properly labeled Site marked: the operative site was marked Imaging studies: imaging studies available Required items: required blood products, implants, devices, and special equipment available Patient identity confirmed: verbally with patient and arm band Time out: Immediately prior to procedure a "time out" was called to verify the correct patient, procedure, equipment, support staff and site/side marked as required. Local anesthesia  used: no  Anesthesia: Local anesthesia used: no  Sedation: Patient sedated: yes Sedatives: propofol  Analgesia: fentanyl  Sedation start date/time: 12/03/2023 1:30 AM Sedation end date/time: 12/03/2023 2:00 AM Vitals: Vital signs were monitored during sedation.  Patient tolerance: patient tolerated the procedure well with no immediate complications     Cardiac monitor shows normal sinus rhythm, per my interpretation.  Medications Ordered in ED Medications  propofol  (DIPRIVAN ) 10 mg/mL bolus/IV push 55.6 mg (has no administration in time range)  oxyCODONE -acetaminophen  (PERCOCET/ROXICET) 5-325 MG per tablet 1 tablet (has no administration in time range)  oxyCODONE -acetaminophen  (PERCOCET/ROXICET) 5-325 MG per tablet 1 tablet (1 tablet Oral Given 12/02/23 2322)  fentaNYL  (SUBLIMAZE ) injection 100 mcg (100 mcg Intravenous Given 12/03/23 0059)    ED Course/ Medical Decision Making/ A&P                                 Medical Decision Making Amount and/or Complexity of Data Reviewed Radiology: ordered.  Risk Prescription drug management.   Right shoulder injury concerning for dislocation.  X-rays obtained at triage show anterior dislocation.  I am proceeding with procedural sedation.  There was successful reduction of dislocation under procedural sedation.  I have ordered a sling to be placed.  Postreduction x-ray shows adequate reduction.  I am referring her to orthopedics for follow-up.  I am giving prescription for oxycodone  for pain, but advised to use acetaminophen  and ibuprofen  as her primary analgesics..  Final Clinical Impression(s) / ED Diagnoses Final diagnoses:  Closed anterior dislocation of right shoulder, initial encounter    Rx / DC Orders ED Discharge Orders          Ordered    oxyCODONE  (ROXICODONE ) 5 MG immediate release tablet  Every 4 hours PRN        12/03/23 0214              Alissa April, MD 12/03/23 [phone]

## 2023-12-03 NOTE — Sedation Documentation (Signed)
 X3 dose of Propofol - 30mg 

## 2023-12-03 NOTE — Progress Notes (Signed)
 Orthopedic Tech Progress Note Patient Details:  Lauren Poole 18-Mar-1991 [id number] Applied shoulder sling per order. Ortho Devices Type of Ortho Device: Sling immobilizer Ortho Device/Splint Location: RUE Ortho Device/Splint Interventions: Ordered, Application, Adjustment   Post Interventions Patient Tolerated: Well Instructions Provided: Adjustment of device, Care of device  [name] 12/03/2023, 2:04 AM

## 2023-12-03 NOTE — Discharge Instructions (Signed)
 Wear the sling until told to remove it by your orthopedic doctor.  Apply ice for 30 minutes at a time, 4 times a day.  Take acetaminophen  and/or ibuprofen  as needed for pain.  Please note that if you combine acetaminophen  and ibuprofen , you will get better pain relief and you get from taking either medication by itself.  If you need additional pain relief after taking acetaminophen  and ibuprofen , you may add oxycodone .

## 2023-12-03 NOTE — Sedation Documentation (Signed)
 X2 dose of propofol  given- 30mg 

## 2024-01-26 ENCOUNTER — Emergency Department (HOSPITAL_COMMUNITY)
Admission: EM | Admit: 2024-01-26 | Discharge: 2024-01-26 | Disposition: A | Discharge status: Home / Self Care | Attending: Emergency Medicine | Admitting: Emergency Medicine

## 2024-01-26 ENCOUNTER — Emergency Department (HOSPITAL_COMMUNITY)

## 2024-01-26 ENCOUNTER — Other Ambulatory Visit: Payer: Self-pay

## 2024-01-26 DIAGNOSIS — X509XXA Other and unspecified overexertion or strenuous movements or postures, initial encounter: Secondary | ICD-10-CM | POA: Insufficient documentation

## 2024-01-26 DIAGNOSIS — S43014A Anterior dislocation of right humerus, initial encounter: Secondary | ICD-10-CM | POA: Diagnosis not present

## 2024-01-26 DIAGNOSIS — S4991XA Unspecified injury of right shoulder and upper arm, initial encounter: Secondary | ICD-10-CM | POA: Diagnosis present

## 2024-01-26 DIAGNOSIS — S43004A Unspecified dislocation of right shoulder joint, initial encounter: Secondary | ICD-10-CM

## 2024-01-26 MED ORDER — FENTANYL CITRATE PF 50 MCG/ML IJ SOSY
50.0000 ug | PREFILLED_SYRINGE | Freq: Once | INTRAMUSCULAR | Status: AC
  Administered 2024-01-26: 50 ug via INTRAVENOUS
  Filled 2024-01-26: qty 1

## 2024-01-26 MED ORDER — HYDROCODONE-ACETAMINOPHEN 5-325 MG PO TABS: 1.0000 | ORAL_TABLET | Freq: Four times a day (QID) | ORAL | 0 refills | Status: DC | PRN

## 2024-01-26 MED ORDER — PROPOFOL 10 MG/ML IV BOLUS
0.5000 mg/kg | Freq: Once | INTRAVENOUS | Status: AC
  Administered 2024-01-26: 55.6 mg via INTRAVENOUS
  Filled 2024-01-26: qty 20

## 2024-01-26 NOTE — ED Triage Notes (Signed)
 Pt brought by the EMS complaining of right shoulder dislocation. As per pt she was sleeping and turning on her side when her shoulder pop and felt pain. With history of right shoulder dislocation before.

## 2024-01-26 NOTE — Progress Notes (Signed)
 Respiratory therapy present for the duration of sedation procedure.  AMBU bag, suction and EtCO2 monitoring. Patient awake and alert prior to leaving the room.

## 2024-01-26 NOTE — Discharge Instructions (Signed)
 You were seen this morning for a dislocated shoulder.  This was reduced here in the emergency department.  Please continue to wear the sling until you are able to follow-up with orthopedics for further evaluation.

## 2024-01-26 NOTE — Sedation Documentation (Signed)
 If patient can tolerate PO challenge may discharge.

## 2024-01-26 NOTE — ED Provider Notes (Signed)
 [place] EMERGENCY DEPARTMENT AT [facility] Provider Note   CSN: [id number] Arrival date & time: 01/26/24  0456     History  Chief Complaint  Patient presents with   Dislocation    Lauren Poole is a 33 y.o. female.  Patient presents the emergency room complaining of right-sided shoulder pain.  She has a history of anterior shoulder dislocation and feels that she has dislocated her shoulder this evening.  She was rolling over in bed when she felt a pop.  She denies any falls or direct trauma to the area.  Past medical history otherwise noncontributory  HPI     Home Medications Prior to Admission medications   Medication Sig Start Date End Date Taking? Authorizing Provider  Cholecalciferol  (VITAMIN D -3) 125 MCG (5000 UT) TABS Take 1 tablet by mouth daily. 11/30/23  Yes Timmothy Foots, MD  HYDROcodone -acetaminophen  (NORCO/VICODIN) 5-325 MG tablet Take 1 tablet by mouth every 6 (six) hours as needed. 01/26/24  Yes Elisa Guest, PA-C  Cholecalciferol  125 MCG (5000 UT) TABS Take 1 tablet (5,000 Units total) by mouth daily. Patient not taking: Reported on 01/26/2024 11/30/23   Timmothy Foots, MD  escitalopram  (LEXAPRO ) 5 MG tablet Take 1 tablet (5 mg total) by mouth at bedtime. Patient not taking: Reported on 01/26/2024 11/30/23   Timmothy Foots, MD  oxyCODONE  (ROXICODONE ) 5 MG immediate release tablet Take 1 tablet (5 mg total) by mouth every 4 (four) hours as needed for severe pain (pain score 7-10). Patient not taking: Reported on 01/26/2024 12/03/23   Alissa April, MD  traZODone  (DESYREL ) 50 MG tablet Take 1 tablet (50 mg total) by mouth at bedtime. For sleep. Patient not taking: Reported on 01/26/2024 11/04/23   Asuncion Layer I, NP  Vitamin D , Ergocalciferol , (DRISDOL ) 1.25 MG (50000 UNIT) CAPS capsule Take 1 capsule (50,000 Units total) by mouth every 7 (seven) days. Patient not taking: Reported on 01/26/2024 12/06/23   Timmothy Foots, MD      Allergies     Patient has no known allergies.    Review of Systems   Review of Systems  Physical Exam Updated Vital Signs BP (!) 127/98 (BP Location: Left Arm)   Pulse 69   Temp 97.9 F (36.6 C) (Oral)   Resp 15   Ht 5' 4 (1.626 m)   Wt 111.1 kg   SpO2 98%   BMI 42.05 kg/m  Physical Exam Vitals and nursing note reviewed.  Cardiovascular:     Rate and Rhythm: Normal rate.  Pulmonary:     Effort: Pulmonary effort is normal.  Musculoskeletal:        General: Deformity present.     Comments: Questionable dislocation of right shoulder.  Palpable radial pulse.  Sensation intact.     ED Results / Procedures / Treatments   Labs (all labs ordered are listed, but only abnormal results are displayed) Labs Reviewed - No data to display   EKG None  Radiology DG Shoulder Right Result Date: 01/26/2024 CLINICAL DATA:  33 year old female with acute recurrent shoulder dislocation while turning in sleep. EXAM: RIGHT SHOULDER - 2+ VIEW COMPARISON:  Right shoulder series 12/03/2023 and earlier. FINDINGS: Three views at 0524 hours. Anterior, subcoracoid glenohumeral dislocation appearing very similar to 12/02/2023. No superimposed acute fracture identified about the right shoulder. Underlying normal bone mineralization. Stable visible right chest, lower right lung volume. IMPRESSION: Recurrent anterior, subcoracoid right glenohumeral dislocation. No fracture identified. Electronically Signed   By: Arline Laity.D.  On: 01/26/2024 05:38    Procedures .Reduction of shoulder dislocation  Date/Time: 01/26/2024 6:23 AM  Performed by: Elisa Guest, PA-C Authorized by: Delories Fetter  Consent: Verbal consent obtained. Written consent obtained. Risks and benefits: risks, benefits and alternatives were discussed Consent given by: patient Patient understanding: patient states understanding of the procedure being performed Patient consent: the patient's understanding of the procedure matches  consent given Procedure consent: procedure consent matches procedure scheduled Imaging studies: imaging studies available Patient identity confirmed: verbally with patient Time out: Immediately prior to procedure a time out was called to verify the correct patient, procedure, equipment, support staff and site/side marked as required.  Sedation: Patient sedated: yes (See separate note)        Medications Ordered in ED Medications  propofol  (DIPRIVAN ) 10 mg/mL bolus/IV push 55.6 mg (has no administration in time range)  fentaNYL  (SUBLIMAZE ) injection 50 mcg (50 mcg Intravenous Given 01/26/24 1914)    ED Course/ Medical Decision Making/ A&P                                 Medical Decision Making Amount and/or Complexity of Data Reviewed Labs: ordered. Radiology: ordered.  Risk Prescription drug management.   This patient presents to the ED for concern of shoulder pain, this involves an extensive number of treatment options, and is a complaint that carries with it a high risk of complications and morbidity.  The differential diagnosis includes fracture, dislocation, soft tissue injury, others   Co morbidities / Chronic conditions that complicate the patient evaluation  History of anterior shoulder dislocation   Additional history obtained:  Additional history obtained from EMR   Imaging Studies ordered:  I ordered imaging studies including plain films of the right shoulder  I independently visualized and interpreted imaging which showed anterior dislocation; repeat films show reduction I agree with the radiologist interpretation   Problem List / ED Course / Critical interventions / Medication management   I ordered medication including fentanyl    Reevaluation of the patient after these medicines showed that the patient improved I have reviewed the patients home medicines and have made adjustments as needed   Social Determinants of Health:  Patient has  Medicaid for her primary health insurance type, is a tobacco smoker   Test / Admission - Considered:  Patient with anterior shoulder dislocation.  Patient will need sedation for reduction followed by shoulder sling.  Patient will need to follow-up with orthopedics for further evaluation.  No indication for admission.  Return precautions provided.         Final Clinical Impression(s) / ED Diagnoses Final diagnoses:  Dislocation of right shoulder joint, initial encounter    Rx / DC Orders ED Discharge Orders          Ordered    HYDROcodone -acetaminophen  (NORCO/VICODIN) 5-325 MG tablet  Every 6 hours PRN        01/26/24 0652              Elisa Guest, PA-C 01/26/24 7829    Eldon Greenland, MD 01/26/24 [phone]

## 2024-01-26 NOTE — Sedation Documentation (Signed)
 Discharged with significant other stating that she needed to be discharged with family or friend to assist her today seeing as she received medication for sedation.  Patient ambulatory and alert and oriented upon discharge.

## 2024-01-26 NOTE — ED Notes (Signed)
Arm sling applied to right shoulder

## 2024-02-28 ENCOUNTER — Encounter: Payer: Self-pay | Admitting: Intensive Care

## 2024-02-28 ENCOUNTER — Other Ambulatory Visit: Payer: Self-pay

## 2024-02-28 ENCOUNTER — Emergency Department
Admission: EM | Admit: 2024-02-28 | Discharge: 2024-02-29 | Disposition: A | Discharge status: Other Acute Inpatient Hospital

## 2024-02-28 DIAGNOSIS — F17210 Nicotine dependence, cigarettes, uncomplicated: Secondary | ICD-10-CM | POA: Insufficient documentation

## 2024-02-28 DIAGNOSIS — R45851 Suicidal ideations: Secondary | ICD-10-CM | POA: Insufficient documentation

## 2024-02-28 DIAGNOSIS — F3164 Bipolar disorder, current episode mixed, severe, with psychotic features: Secondary | ICD-10-CM | POA: Diagnosis present

## 2024-02-28 DIAGNOSIS — F315 Bipolar disorder, current episode depressed, severe, with psychotic features: Secondary | ICD-10-CM

## 2024-02-28 DIAGNOSIS — F99 Mental disorder, not otherwise specified: Secondary | ICD-10-CM | POA: Diagnosis not present

## 2024-02-28 HISTORY — DX: Bipolar disorder, unspecified: F31.9

## 2024-02-28 LAB — COMPREHENSIVE METABOLIC PANEL WITH GFR
ALT: 30 U/L (ref 0–44)
AST: 30 U/L (ref 15–41)
Albumin: 4.3 g/dL (ref 3.5–5.0)
Alkaline Phosphatase: 80 U/L (ref 38–126)
Anion gap: 11 (ref 5–15)
BUN: 10 mg/dL (ref 6–20)
CO2: 22 mmol/L (ref 22–32)
Calcium: 9.6 mg/dL (ref 8.9–10.3)
Chloride: 105 mmol/L (ref 98–111)
Creatinine, Ser: 0.81 mg/dL (ref 0.44–1.00)
GFR, Estimated: >60 mL/min (ref >60)
Glucose, Bld: 100 mg/dL — ABNORMAL HIGH (ref 70–99)
Potassium: 3.6 mmol/L (ref 3.5–5.1)
Sodium: 138 mmol/L (ref 135–145)
Total Bilirubin: 0.7 mg/dL (ref 0.0–1.2)
Total Protein: 7.6 g/dL (ref 6.5–8.1)

## 2024-02-28 LAB — SALICYLATE LEVEL: Salicylate Lvl: <7 mg/dL — ABNORMAL LOW (ref 7.0–30.0)

## 2024-02-28 LAB — URINE DRUG SCREEN, QUALITATIVE (ARMC ONLY)
Amphetamines, Ur Screen: NOT DETECTED
Barbiturates, Ur Screen: NOT DETECTED
Benzodiazepine, Ur Scrn: NOT DETECTED
Cannabinoid 50 Ng, Ur ~~LOC~~: NOT DETECTED
Cocaine Metabolite,Ur ~~LOC~~: NOT DETECTED
MDMA (Ecstasy)Ur Screen: NOT DETECTED
Methadone Scn, Ur: NOT DETECTED
Opiate, Ur Screen: NOT DETECTED
Phencyclidine (PCP) Ur S: NOT DETECTED
Tricyclic, Ur Screen: NOT DETECTED

## 2024-02-28 LAB — CBC
HCT: 39.6 % (ref 36.0–46.0)
Hemoglobin: 13.5 g/dL (ref 12.0–15.0)
MCH: 31.7 pg (ref 26.0–34.0)
MCHC: 34.1 g/dL (ref 30.0–36.0)
MCV: 93 fL (ref 80.0–100.0)
Platelets: 215 K/uL (ref 150–400)
RBC: 4.26 MIL/uL (ref 3.87–5.11)
RDW: 13.3 % (ref 11.5–15.5)
WBC: 5.9 K/uL (ref 4.0–10.5)
nRBC: 0 % (ref 0.0–0.2)

## 2024-02-28 LAB — ETHANOL: Alcohol, Ethyl (B): <15 mg/dL (ref <15)

## 2024-02-28 LAB — ACETAMINOPHEN LEVEL: Acetaminophen (Tylenol), Serum: <10 ug/mL — ABNORMAL LOW (ref 10–30)

## 2024-02-28 LAB — POC URINE PREG, ED: Preg Test, Ur: NEGATIVE

## 2024-02-28 MED ORDER — OLANZAPINE 5 MG PO TABS
5.0000 mg | ORAL_TABLET | Freq: Once | ORAL | Status: AC
  Administered 2024-02-28: 5 mg via ORAL
  Filled 2024-02-28: qty 1

## 2024-02-28 MED ORDER — ACETAMINOPHEN 500 MG PO TABS: 1000.0000 mg | ORAL_TABLET | Freq: Once | ORAL | Status: DC

## 2024-02-28 NOTE — ED Notes (Signed)
 Pt talking  with TTS. Escorted by Psychologist, forensic.

## 2024-02-28 NOTE — ED Notes (Signed)
 Patient asking for a gun and hand gesturing holding a gun to her head. Patient asking if she was to die in room would I revive her and requesting writer not to.  Patient advised that would not happen she would be revived. Charge advised. Patient to be moved to QUAD room 20hall.

## 2024-02-28 NOTE — ED Notes (Signed)
Pt refused snack at this time.  

## 2024-02-28 NOTE — BH Assessment (Signed)
 Comprehensive Clinical Assessment (CCA) Screening, Triage and Referral Note  02/28/2024 Lauren Poole [id number] Lauren Poole is a 33 year old, [place] speaking, Black female. Pt presented to [facility] ED under IVC. Per triage note: Patient presents with SI/HI. Reports she has been living with her mom and had to leave today. Reports hearing her mom say on the phone she is going to lose 32 teeth today. Patient relayed during triage that there is a witch hunt going on and people are trying to kill her.  The client presented as defensive and resistant throughout the assessment. She was notably guarded and declined to answer most questions in depth, stating, "I don't know why you're asking me all these questions. I don't know whether you're going to take my thoughts and change my personality." She appeared fixated on past trauma and expressed feeling jaded and disgusted with both family and humanity, stating that "things pile up." Pt did admit to being slightly depressed. Pt was preoccupied with paranoid delusions of people trying to kill her.  The client denied current homicidal ideation (HI) and auditory/visual hallucinations (AV/H), but alluded to passive suicidal ideation (SI), stating she does not want to continue living if others continue to interfere with her autonomy. Pt denied substance use. Urine Drug Screen (UDS) and Blood Alcohol Level (BAL) screenings were unremarkable. Guarded and paranoid presentation with poor insight and judgment.    Chief Complaint:  Chief Complaint  Patient presents with   Suicidal   Visit Diagnosis: MDD, recurrent, severe with psychosis  Patient Reported Information How did you hear about us ? No data recorded What Is the Reason for Your Visit/Call Today? No data recorded How Long Has This Been Causing You Problems? No data recorded What Do You Feel Would Help You the Most Today? No data recorded  Have You Recently Had Any Thoughts About Hurting  Yourself? No data recorded Are You Planning to Commit Suicide/Harm Yourself At This time? No data recorded  Have you Recently Had Thoughts About Hurting Someone Sher[place]? No data recorded Are You Planning to Harm Someone at This Time? No data recorded Explanation: No data recorded  Have You Used Any Alcohol or Drugs in the Past 24 Hours? No data recorded How Long Ago Did You Use Drugs or Alcohol? No data recorded What Did You Use and How Much? No data recorded  Do You Currently Have a Therapist/Psychiatrist? No data recorded Name of Therapist/Psychiatrist: No data recorded  Have You Been Recently Discharged From Any Office Practice or Programs? No data recorded Explanation of Discharge From Practice/Program: No data recorded   CCA Screening Triage Referral Assessment Type of Contact: No data recorded Telemedicine Service Delivery:   Is this Initial or Reassessment?   Date Telepsych consult ordered in [facility]:    Time Telepsych consult ordered in [facility]:    Location of Assessment: No data recorded Provider Location: No data recorded   Collateral Involvement: No data recorded  Does Patient Have a Court Appointed Legal Guardian? No data recorded Name and Contact of Legal Guardian: No data recorded If Minor and Not Living with Parent(s), Who has Custody? No data recorded Is CPS involved or ever been involved? No data recorded Is APS involved or ever been involved? No data recorded  Patient Determined To Be At Risk for Harm To Self or Others Based on Review of Patient Reported Information or Presenting Complaint? No data recorded Method: No data recorded Availability of Means: No data recorded Intent: No data recorded Notification Required: No data recorded Additional  Information for Danger to Others Potential: No data recorded Additional Comments for Danger to Others Potential: No data recorded Are There Guns or Other Weapons in Your Home? No data recorded Types of Guns/Weapons: No data  recorded Are These Weapons Safely Secured?                            No data recorded Who Could Verify You Are Able To Have These Secured: No data recorded Do You Have any Outstanding Charges, Pending Court Dates, Parole/Probation? No data recorded Contacted To Inform of Risk of Harm To Self or Others: No data recorded  Does Patient Present under Involuntary Commitment? No data recorded   [place] of Residence: No data recorded  Patient Currently Receiving the Following Services: No data recorded  Determination of Need: No data recorded  Options For Referral: No data recorded  Disposition Recommendation per psychiatric provider: Pending psych consult.   Chesney Klimaszewski R Deserae Jennings, LCAS

## 2024-02-28 NOTE — ED Notes (Signed)
 Patient noted to be talking to officer while in 20hall stating what would you do if I got all in your face and just kicked you in the face?. Officer redirected patient. Patient irritable with responses at this time.

## 2024-02-28 NOTE — ED Provider Notes (Signed)
 [facility] Provider Note    Event Date/Time   First MD Initiated Contact with Patient 02/28/24 1829     (approximate)   History   Suicidal  Patient presents with SI/HI. Reports she has been living with her mom and had to leave today. Reports hearing her mom say on the phone she is going to lose 32 teeth today.  Patient relayed during triage that there is a witch hunt going on and people are trying to kill her.  Denies hallucinations   Reports she was diagnosed with bipolar but has not had medicine in months   HPI Lauren Poole is a 33 y.o. female PMH bipolar disorder presents for evaluation of suicidal ideation, possible homicidal ideation -Patient states she is planning to kill herself today when possible, states that she would like the police officer in front of her to shoot her in the head with a bullet to make it a clean kill -Also says she left the house today because she was feeling like she might hurt people -Does not respond clearly when asked about hallucinations but does appear to be responding to internal stimuli -Denies any substance use -Denies any physical complaints -Denies any attempts at self-harm today     Physical Exam   Triage Vital Signs: ED Triage Vitals  Encounter Vitals Group     BP 02/28/24 1818 (!) 144/94     Girls Systolic BP Percentile --      Girls Diastolic BP Percentile --      Boys Systolic BP Percentile --      Boys Diastolic BP Percentile --      Pulse Rate 02/28/24 1818 97     Resp 02/28/24 1818 16     Temp 02/28/24 1818 99.5 F (37.5 C)     Temp Source 02/28/24 1818 Oral     SpO2 02/28/24 1818 96 %     Weight 02/28/24 1819 240 lb (108.9 kg)     Height 02/28/24 1819 5' 4 (1.626 m)     Head Circumference --      Peak Flow --      Pain Score 02/28/24 1819 0     Pain Loc --      Pain Education --      Exclude from Growth Chart --     Most recent vital signs: Vitals:   02/28/24 1818  02/28/24 2229  BP: (!) 144/94 (!) 147/102  Pulse: 97 76  Resp: 16 16  Temp: 99.5 F (37.5 C) 98.3 F (36.8 C)  SpO2: 96% 95%     General: Awake, no distress.  CV:  Good peripheral perfusion. RRR, RP 2+ Resp:  Normal effort. CTAB Abd:  No distention. Nontender to deep palpation throughout Psych:  [place]ors is SI and HI, denies hallucinations but does appear to be responding to internal stimuli.  Very disorganized.   ED Results / Procedures / Treatments   Labs (all labs ordered are listed, but only abnormal results are displayed) Labs Reviewed  COMPREHENSIVE METABOLIC PANEL WITH GFR - Abnormal; Notable for the following components:      Result Value   Glucose, Bld 100 (*)    All other components within normal limits  SALICYLATE LEVEL - Abnormal; Notable for the following components:   Salicylate Lvl <7.0 (*)    All other components within normal limits  ACETAMINOPHEN  LEVEL - Abnormal; Notable for the following components:   Acetaminophen  (Tylenol ), Serum <10 (*)    All other components  within normal limits  ETHANOL  CBC  URINE DRUG SCREEN, QUALITATIVE ([facility] ONLY)  POC URINE PREG, ED     EKG  Ecg = sinus rhythm, rate 69, no gross ST elevation or depression, no significant repolarization abnormality no clear evidence of ischemia nor arrhythmia on my interpretation.  Isolated T wave inversion in V3 is nonspecific.   RADIOLOGY N/a    PROCEDURES:  Critical Care performed: No  Procedures   MEDICATIONS ORDERED IN ED: Medications  acetaminophen  (TYLENOL ) tablet 1,000 mg (0 mg Oral Hold 02/28/24 1902)  OLANZapine  (ZYPREXA ) tablet 5 mg (5 mg Oral Given 02/28/24 1927)     IMPRESSION / MDM / ASSESSMENT AND PLAN / ED COURSE  I reviewed the triage vital signs and the nursing notes.                              DDX/MDM/AP: Differential diagnosis includes, but is not limited to, suspect primary psychiatric disorder, consider concomitant substance use, do not suspect  underlying organic etiology including infection or metabolic abnormality.  Does meet IVC criteria-will place.  Plan for psychiatric evaluation.  Plan: - IVC - Labs - EKG - Patient is amenable to trial of Zyprexa  - TTS, psychiatry consult  Patient's presentation is most consistent with acute presentation with potential threat to life or bodily function.   ED course below.  Medically cleared.  Psychiatry consulted, disposition per psychiatry.  Clinical Course as of 02/28/24 2332  Mon Feb 28, 2024  1919 CBC, CMP reviewed, unremarkable hCG negative EtOH undetectable UDS negative [MM]  1935 Tylenol , salicylates undetectable  Medically cleared, pending psychiatric eval [MM]    Clinical Course User Index [MM] Clarine Ozell LABOR, MD     FINAL CLINICAL IMPRESSION(S) / ED DIAGNOSES   Final diagnoses:  Suicidal ideation  Psychiatric problem     Rx / DC Orders   ED Discharge Orders     None        Note:  This document was prepared using Dragon voice recognition software and may include unintentional dictation errors.   Clarine Ozell LABOR, MD 02/28/24 [phone]

## 2024-02-28 NOTE — ED Triage Notes (Signed)
 Patient presents with SI/HI. Reports she has been living with her mom and had to leave today. Reports hearing her mom say on the phone she is going to lose 32 teeth today.  Patient relayed during triage that there is a witch hunt going on and people are trying to kill her.  Denies hallucinations   Reports she was diagnosed with bipolar but has not had medicine in months

## 2024-02-28 NOTE — ED Notes (Signed)
 Pt. To BHU from ED ambulatory without difficulty, to room  BHU 3. Report from [employer] Pt. Is alert and oriented, warm and dry in no distress. Pt. Denies SI, HI, and AVH. Pt. Calm and cooperative. Pt slow to respond to questions appears to be responding to internal stimuli. Pt. Made aware of security cameras and Q15 minute rounds. Pt. Encouraged to let Nursing staff know of any concerns or needs.   ENVIRONMENTAL ASSESSMENT Potentially harmful objects out of patient reach: Yes.   Personal belongings secured: Yes.   Patient dressed in hospital provided attire only: Yes.   Plastic bags out of patient reach: Yes.   Patient care equipment (cords, cables, call bells, lines, and drains) shortened, removed, or accounted for: Yes.   Equipment and supplies removed from bottom of stretcher: Yes.   Potentially toxic materials out of patient reach: Yes.   Sharps container removed or out of patient reach: Yes.

## 2024-02-29 ENCOUNTER — Encounter: Payer: Self-pay | Admitting: Psychiatry

## 2024-02-29 ENCOUNTER — Other Ambulatory Visit: Payer: Self-pay

## 2024-02-29 ENCOUNTER — Inpatient Hospital Stay
Admission: AD | Admit: 2024-02-29 | Discharge: 2024-03-08 | DRG: 885 | Disposition: A | Discharge status: Home / Self Care | Source: Intra-hospital | Attending: Psychiatry | Admitting: Psychiatry

## 2024-02-29 DIAGNOSIS — Z560 Unemployment, unspecified: Secondary | ICD-10-CM

## 2024-02-29 DIAGNOSIS — Z91148 Patient's other noncompliance with medication regimen for other reason: Secondary | ICD-10-CM

## 2024-02-29 DIAGNOSIS — F29 Unspecified psychosis not due to a substance or known physiological condition: Secondary | ICD-10-CM | POA: Diagnosis present

## 2024-02-29 DIAGNOSIS — F17210 Nicotine dependence, cigarettes, uncomplicated: Secondary | ICD-10-CM | POA: Diagnosis present

## 2024-02-29 DIAGNOSIS — F315 Bipolar disorder, current episode depressed, severe, with psychotic features: Principal | ICD-10-CM | POA: Diagnosis present

## 2024-02-29 DIAGNOSIS — Z9152 Personal history of nonsuicidal self-harm: Secondary | ICD-10-CM

## 2024-02-29 DIAGNOSIS — T43226A Underdosing of selective serotonin reuptake inhibitors, initial encounter: Secondary | ICD-10-CM | POA: Diagnosis present

## 2024-02-29 DIAGNOSIS — Z818 Family history of other mental and behavioral disorders: Secondary | ICD-10-CM

## 2024-02-29 MED ORDER — DIAZEPAM 5 MG/ML IJ SOLN: 10.0000 mg | Freq: Four times a day (QID) | INTRAMUSCULAR | Status: DC | PRN

## 2024-02-29 MED ORDER — ZIPRASIDONE MESYLATE 20 MG IM SOLR: 10.0000 mg | Freq: Four times a day (QID) | INTRAMUSCULAR | Status: DC | PRN

## 2024-02-29 MED ORDER — TRAZODONE HCL 50 MG PO TABS
50.0000 mg | ORAL_TABLET | Freq: Every evening | ORAL | Status: DC | PRN
Start: 2024-02-29 — End: 2024-03-08
  Administered 2024-02-29 – 2024-03-05 (×2): 50 mg via ORAL
  Filled 2024-02-29 (×4): qty 1

## 2024-02-29 MED ORDER — DIPHENHYDRAMINE HCL 50 MG/ML IJ SOLN: 50.0000 mg | Freq: Three times a day (TID) | INTRAMUSCULAR | Status: DC | PRN

## 2024-02-29 MED ORDER — HYDROXYZINE HCL 25 MG PO TABS
25.0000 mg | ORAL_TABLET | Freq: Four times a day (QID) | ORAL | Status: DC | PRN
  Administered 2024-02-29 – 2024-03-05 (×7): 25 mg via ORAL
  Filled 2024-02-29 (×8): qty 1

## 2024-02-29 MED ORDER — OLANZAPINE 5 MG PO TABS: 2.5000 mg | ORAL_TABLET | ORAL | Status: DC | PRN

## 2024-02-29 MED ORDER — HALOPERIDOL LACTATE 5 MG/ML IJ SOLN: 5.0000 mg | Freq: Three times a day (TID) | INTRAMUSCULAR | Status: DC | PRN

## 2024-02-29 MED ORDER — ACETAMINOPHEN 325 MG PO TABS
650.0000 mg | ORAL_TABLET | Freq: Four times a day (QID) | ORAL | Status: DC | PRN
  Administered 2024-03-03: 650 mg via ORAL
  Filled 2024-02-29: qty 2

## 2024-02-29 MED ORDER — LORAZEPAM 2 MG/ML IJ SOLN: 2.0000 mg | Freq: Three times a day (TID) | INTRAMUSCULAR | Status: DC | PRN

## 2024-02-29 MED ORDER — ALUM & MAG HYDROXIDE-SIMETH 200-200-20 MG/5ML PO SUSP
30.0000 mL | ORAL | Status: DC | PRN
Start: 2024-02-29 — End: 2024-03-08

## 2024-02-29 MED ORDER — DIPHENHYDRAMINE HCL 50 MG/ML IJ SOLN: 50.0000 mg | Freq: Four times a day (QID) | INTRAMUSCULAR | Status: DC | PRN

## 2024-02-29 MED ORDER — MAGNESIUM HYDROXIDE 400 MG/5ML PO SUSP
30.0000 mL | Freq: Every day | ORAL | Status: DC | PRN
Start: 2024-02-29 — End: 2024-03-08

## 2024-02-29 NOTE — Consult Note (Signed)
 Lauren Poole Telepsychiatry Consult Note  Patient Name: Lauren Poole MRN: [id number] DOB: 04/27/1991 DATE OF Consult: 02/29/2024  PRIMARY PSYCHIATRIC DIAGNOSES  1.  Bipolar I Disorder, Mixed Type, Severe with Psychotic Features     RECOMMENDATIONS  Recommendations: Medication recommendations: Recommend not beginning a scheduled psychotropic medication while in ED so that inpatient services can determine which plan would be best, given past treatment.  PRN's:  Zyprexa  Zydis, 2.5 mg SL q4h PRN moderate anxiety; Geodon , 10 mg IM q6h PRN and Valium , 10 mg IM q6h PRN and Benadryl  50 mg IM q6h PRN severe agitation/aggression Non-Medication/therapeutic recommendations: Patient will continue to require close observation for suicide, given that she is continuing to have such thoughts.  Continue matter-of-fact emotional support in ED, pending transfer to Psychiatry Is inpatient psychiatric hospitalization recommended for this patient? Yes (Explain why): Patient is having psychotic depression sx's, with marked paranoia and feelings of danger, and she has had thoughts of killing herself to give myself some peace.  She meets IVC criteria, and I recommend that she be admitted on an IVC, given her markedly shifting mood states. Is another care setting recommended for this patient? (examples may include Crisis Stabilization Unit, Residential/Recovery Treatment, ALF/SNF, Memory Care Unit)  No (Explain why): As above From a psychiatric perspective, is this patient appropriate for discharge to an outpatient setting/resource or other less restrictive environment for continued care?  No (Explain why): As Lauren Poole Follow-Up Telepsychiatry C/L services: We will sign off for now. Please re-consult our service if needed for any concerning changes in the patient's condition, discharge planning, or questions. Communication: Treatment team members (and family members if applicable) who were involved in treatment/care discussions  and planning, and with whom we spoke or engaged with via secure text/chat, include the following: Sent secure message to Dr. Cyrena, ED attending, and ED staff, outlining above recommendations.  Thank you for involving us  in the care of this patient. If you have any additional questions or concerns, please call [phone] and ask for the provider on-call.   TELEPSYCHIATRY ATTESTATION & CONSENT   As the provider for this telehealth consult, I attest that I verified the patient's identity using two separate identifiers, introduced myself to the patient, provided my credentials, disclosed my location, and performed this encounter via a HIPAA-compliant, real-time, face-to-face, two-way, interactive audio and video platform and with the full consent and agreement of the patient (or guardian as applicable.)   Patient physical location:.[facility] ED Telehealth provider physical location: home office in state of [place] .  Video start time: 0645 EDT  Video end time: 0700 EDT   Total time spent in this encounter was 30 minutes, including record review, clinical interview, behavior observations, discussion of impressions and recommendations (including medications and hospitalization), and consultation/communication with relevant parties   IDENTIFYING DATA  Lauren Poole is a 33 y.o. year-old female for whom a psychiatric consultation has been ordered by the primary provider. The patient was identified using two separate identifiers.  CHIEF COMPLAINT/REASON FOR CONSULT   Sometimes I think it would just be better if I weren't in the world.   HISTORY OF PRESENT ILLNESS (HPI)   The patient reports a history of bipolar I diagnosis dating back at least a couple of years.  Has had two hospitalizations earlier this year for paranoid thinking and mood dysregulation.  In May 2025, she was discharged on Lexapro  and trazodone , but admits that she has not been taking it, nor getting any  follow-up  Patient reports that she has been  having increasing suspicions of her mother and her motives.  In an argument, she heard her mother make a threat to her, and she became quite agitated.  Has been believing that people are trying to control her life, and with her fears about her mother, she began having plans for suicide.  Has tried once before, cutting her wrists.  Patient has not been sleeping.  Has not been working.  BAL on admission:  negative, although she reports have a glass of wine a couple of times a week.  UDS negative.  In the ED, she did talk about hurting people, and she has made threats to harm her mother.  Denies hallucinations, but marked delusions of persecution and control. Lauren Poole  PAST PSYCHIATRIC HISTORY   Otherwise as per HPI above.  PAST MEDICAL HISTORY  Past Medical History:  Diagnosis Date   Bipolar 1 disorder (HCC)    Chlamydia    Gonorrhea      HOME MEDICATIONS  Facility Ordered Medications  Medication   acetaminophen  (TYLENOL ) tablet 1,000 mg   [COMPLETED] OLANZapine  (ZYPREXA ) tablet 5 mg   OLANZapine  (ZYPREXA ) tablet 2.5 mg   ziprasidone  (GEODON ) injection 10 mg   diazepam  (VALIUM ) injection 10 mg   diphenhydrAMINE  (BENADRYL ) injection 50 mg   PTA Medications  Medication Sig   traZODone  (DESYREL ) 50 MG tablet Take 1 tablet (50 mg total) by mouth at bedtime. For sleep. (Patient not taking: Reported on 01/26/2024)   escitalopram  (LEXAPRO ) 5 MG tablet Take 1 tablet (5 mg total) by mouth at bedtime. (Patient not taking: Reported on 01/26/2024)   Vitamin D , Ergocalciferol , (DRISDOL ) 1.25 MG (50000 UNIT) CAPS capsule Take 1 capsule (50,000 Units total) by mouth every 7 (seven) days. (Patient not taking: Reported on 01/26/2024)   Cholecalciferol  125 MCG (5000 UT) TABS Take 1 tablet (5,000 Units total) by mouth daily. (Patient not taking: Reported on 01/26/2024)   Cholecalciferol  (VITAMIN D -3) 125 MCG (5000 UT) TABS Take 1 tablet by mouth daily. (Patient not taking:  Reported on 02/29/2024)   oxyCODONE  (ROXICODONE ) 5 MG immediate release tablet Take 1 tablet (5 mg total) by mouth every 4 (four) hours as needed for severe pain (pain score 7-10). (Patient not taking: Reported on 01/26/2024)   HYDROcodone -acetaminophen  (NORCO/VICODIN) 5-325 MG tablet Take 1 tablet by mouth every 6 (six) hours as needed. (Patient not taking: Reported on 02/29/2024)   Taking none  ALLERGIES  No Known Allergies  SOCIAL & SUBSTANCE USE HISTORY  Social History   Socioeconomic History   Marital status: Single    Spouse name: Not on file   Number of children: Not on file   Years of education: Not on file   Highest education level: Not on file  Occupational History   Not on file  Tobacco Use   Smoking status: Every Day    Current packs/day: 0.50    Average packs/day: 0.5 packs/day for 6.4 years (3.2 ttl pk-yrs)    Types: Cigarettes    Start date: 10/2017   Smokeless tobacco: Never  Vaping Use   Vaping status: Never Used  Substance and Sexual Activity   Alcohol use: Yes    Alcohol/week: 4.0 standard drinks of alcohol    Types: 4 Glasses of wine per week   Drug use: Not Currently    Types: Marijuana   Sexual activity: Yes    Birth control/protection: None  Other Topics Concern   Not on file  Social History Narrative   Not on file   Social Drivers  of Health   Financial Resource Strain: Not on file  Food Insecurity: No Food Insecurity (11/22/2023)   Hunger Vital Sign    Worried About Running Out of Food in the Last Year: Never true    Ran Out of Food in the Last Year: Never true  Transportation Needs: No Transportation Needs (11/22/2023)   PRAPARE - Administrator, Civil Service (Medical): No    Lack of Transportation (Non-Medical): No  Physical Activity: Not on file  Stress: Not on file  Social Connections: Not on file   Social History   Tobacco Use  Smoking Status Every Day   Current packs/day: 0.50   Average packs/day: 0.5 packs/day for 6.4  years (3.2 ttl pk-yrs)   Types: Cigarettes   Start date: 10/2017  Smokeless Tobacco Never   Social History   Substance and Sexual Activity  Alcohol Use Yes   Alcohol/week: 4.0 standard drinks of alcohol   Types: 4 Glasses of wine per week   Social History   Substance and Sexual Activity  Drug Use Not Currently   Types: Marijuana    Additional pertinent information  Patient is not working, and spend most of her time with her mother.  FAMILY HISTORY  Family History  Problem Relation Age of Onset   Schizophrenia Sister    Family Psychiatric History (if known):  Sister, as above, and mother also reported to have bipolar disorder   MENTAL STATUS EXAM (MSE)  Mental Status Exam: General Appearance: Fairly Groomed  Orientation:  Full (Time, Place, and Person)  Memory:  Immediate;   Poor Recent;   Fair Remote;   Fair  Concentration:  Concentration: Poor and Attention Span: Poor  Recall:  Fair  Attention  Poor  Eye Contact:  intermittent  Speech:  Normal Rate  Language:  Good  Volume:  Normal  Mood: I sometimes think it would be better if I were dead  Affect:  Depressed and Labile  Thought Process:  Disorganized  Thought Content:  Delusions, Hallucinations: Auditory, and Paranoid Ideation  Suicidal Thoughts:  Yes.  with intent/plan  Homicidal Thoughts:  Yes.  without intent/plan  Judgement:  Impaired  Insight:  Lacking  Psychomotor Activity:  Normal  Akathisia:  Negative  Fund of Knowledge:  Good    Assets:  Housing Social Support  Cognition:  WNL  ADL's:  Intact  AIMS (if indicated):       VITALS  Blood pressure (!) 147/102, pulse 76, temperature 98.3 F (36.8 C), temperature source Oral, resp. rate 16, height 5' 4 (1.626 m), weight 108.9 kg, SpO2 95%.  LABS  Admission on 02/28/2024  Component Date Value Ref Range Status   Sodium 02/28/2024 138  135 - 145 mmol/L Final   Potassium 02/28/2024 3.6  3.5 - 5.1 mmol/L Final   Chloride 02/28/2024 105  98 - 111  mmol/L Final   CO2 02/28/2024 22  22 - 32 mmol/L Final   Glucose, Bld 02/28/2024 100 (H)  70 - 99 mg/dL Final   Glucose reference range applies only to samples taken after fasting for at least 8 hours.   BUN 02/28/2024 10  6 - 20 mg/dL Final   Creatinine, Ser 02/28/2024 0.81  0.44 - 1.00 mg/dL Final   Calcium 92/85/7974 9.6  8.9 - 10.3 mg/dL Final   Total Protein 92/85/7974 7.6  6.5 - 8.1 g/dL Final   Albumin 92/85/7974 4.3  3.5 - 5.0 g/dL Final   AST 92/85/7974 30  15 - 41 U/L  Final   ALT 02/28/2024 30  0 - 44 U/L Final   Alkaline Phosphatase 02/28/2024 80  38 - 126 U/L Final   Total Bilirubin 02/28/2024 0.7  0.0 - 1.2 mg/dL Final   GFR, Estimated 02/28/2024 >60  >60 mL/min Final   Comment: (NOTE) Calculated using the CKD-EPI Creatinine Equation (2021)    Anion gap 02/28/2024 11  5 - 15 Final   Performed at [facility], [address] [place], [place] [address]   Alcohol, Ethyl (B) 02/28/2024 <15  <15 mg/dL Final   Comment: (NOTE) For medical purposes only. Performed at [facility], [address] [place], [place] [address]    WBC 02/28/2024 5.9  4.0 - 10.5 K/uL Final   RBC 02/28/2024 4.26  3.87 - 5.11 MIL/uL Final   Hemoglobin 02/28/2024 13.5  12.0 - 15.0 g/dL Final   HCT 92/85/7974 39.6  36.0 - 46.0 % Final   MCV 02/28/2024 93.0  80.0 - 100.0 fL Final   MCH 02/28/2024 31.7  26.0 - 34.0 pg Final   MCHC 02/28/2024 34.1  30.0 - 36.0 g/dL Final   RDW 92/85/7974 13.3  11.5 - 15.5 % Final   Platelets 02/28/2024 215  150 - 400 K/uL Final   nRBC 02/28/2024 0.0  0.0 - 0.2 % Final   Performed at [facility], [address] [place], [place] [address]   Tricyclic, Ur Screen 02/28/2024 NONE DETECTED  NONE DETECTED Final   Amphetamines, Ur Screen 02/28/2024 NONE DETECTED  NONE DETECTED Final   MDMA (Ecstasy)Ur Screen 02/28/2024 NONE DETECTED  NONE DETECTED Final   Cocaine Metabolite,Ur [place] 02/28/2024 NONE DETECTED  NONE DETECTED Final   Opiate, Ur  Screen 02/28/2024 NONE DETECTED  NONE DETECTED Final   Phencyclidine (PCP) Ur S 02/28/2024 NONE DETECTED  NONE DETECTED Final   Cannabinoid 50 Ng, Ur [place] 02/28/2024 NONE DETECTED  NONE DETECTED Final   Barbiturates, Ur Screen 02/28/2024 NONE DETECTED  NONE DETECTED Final   Benzodiazepine, Ur Scrn 02/28/2024 NONE DETECTED  NONE DETECTED Final   Methadone Scn, Ur 02/28/2024 NONE DETECTED  NONE DETECTED Final   Comment: (NOTE) Tricyclics + metabolites, urine    Cutoff 1000 ng/mL Amphetamines + metabolites, urine  Cutoff 1000 ng/mL MDMA (Ecstasy), urine              Cutoff 500 ng/mL Cocaine Metabolite, urine          Cutoff 300 ng/mL Opiate + metabolites, urine        Cutoff 300 ng/mL Phencyclidine (PCP), urine         Cutoff 25 ng/mL Cannabinoid, urine                 Cutoff 50 ng/mL Barbiturates + metabolites, urine  Cutoff 200 ng/mL Benzodiazepine, urine              Cutoff 200 ng/mL Methadone, urine                   Cutoff 300 ng/mL  The urine drug screen provides only a preliminary, unconfirmed analytical test result and should not be used for non-medical purposes. Clinical consideration and professional judgment should be applied to any positive drug screen result due to possible interfering substances. A more specific alternate chemical method must be used in order to obtain a confirmed analytical result. Gas chromatography / mass spectrometry (GC/MS) is the preferred confirm  atory method. Performed at [facility], [address] [place], [place] [address]    Preg Test, Ur 02/28/2024 Negative  Negative Final   Salicylate Lvl 02/28/2024 <7.0 (L)  7.0 - 30.0 mg/dL Final   Performed at [facility], [address] [place], [place] [address]   Acetaminophen  (Tylenol ), Serum 02/28/2024 <10 (L)  10 - 30 ug/mL Final   Comment: (NOTE) Therapeutic concentrations vary significantly. A range of 10-30 ug/mL  may be an effective concentration  for many patients. However, some  are best treated at concentrations outside of this range. Acetaminophen  concentrations >150 ug/mL at 4 hours after ingestion  and >50 ug/mL at 12 hours after ingestion are often associated with  toxic reactions.  Performed at [facility], [address] [place], [place] [address]     PSYCHIATRIC REVIEW OF SYSTEMS (ROS)  ROS: Notable for the following relevant positive findings: Review of Systems  Constitutional: Negative.   HENT: Negative.    Eyes: Negative.   Respiratory: Negative.    Cardiovascular: Negative.   Gastrointestinal: Negative.   Genitourinary: Negative.   Musculoskeletal: Negative.   Skin: Negative.   Neurological: Negative.   Endo/Heme/Allergies: Negative.   Psychiatric/Behavioral:  Positive for depression and suicidal ideas. The patient is nervous/anxious.     Additional findings:      Musculoskeletal: No abnormal movements observed      Gait & Station: Normal      Pain Screening: Denies      Nutrition & Dental Concerns: Reviewed  RISK FORMULATION/ASSESSMENT  Is the patient experiencing any suicidal or homicidal ideations: Yes        Explain if yes: Patient is continuing to have thoughts that she would prefer to be dead, and her moods continue to be labile.  Protective factors considered for safety management:   Patient does have mother and family, but she has felt increasingly isolated from them recently  Risk factors/concerns considered for safety management:  Prior attempt Depression Recent loss Access to lethal means Impulsivity Aggression Isolation Barriers to accessing treatment Unmarried  Is there a safety management plan with the patient and treatment team to minimize risk factors and promote protective factors: No            Explain: Patient is having suicidal thoughts that are persisting, even with the support of family and ED providers.  Is crisis care placement or psychiatric  hospitalization recommended: Yes     Based on my current evaluation and risk assessment, patient is determined at this time to be at:  High risk  *RISK ASSESSMENT Risk assessment is a dynamic process; it is possible that this patient's condition, and risk level, may change. This should be re-evaluated and managed over time as appropriate. Please re-consult psychiatric consult services if additional assistance is needed in terms of risk assessment and management. If your team decides to discharge this patient, please advise the patient how to best access emergency psychiatric services, or to call 911, if their condition worsens or they feel unsafe in any way.   Adriana JINNY Pontes, MD Telepsychiatry Consult Services

## 2024-02-29 NOTE — ED Notes (Signed)
 IVC patient evaluated by MD admit inpt psych

## 2024-02-29 NOTE — Tx Team (Signed)
 Initial Treatment Plan 02/29/2024 8:36 PM [name] FMW:[id number]    PATIENT STRESSORS: Financial difficulties   Medication change or noncompliance     PATIENT STRENGTHS: Motivation for treatment/growth  Supportive family/friends    PATIENT IDENTIFIED PROBLEMS: Depression  Anxiety  SI                 DISCHARGE CRITERIA:  Ability to meet basic life and health needs Motivation to continue treatment in a less acute level of care  PRELIMINARY DISCHARGE PLAN: Outpatient therapy Return to previous living arrangement  PATIENT/FAMILY INVOLVEMENT: This treatment plan has been presented to and reviewed with the patient, Lauren Poole. The patient has been given the opportunity to ask questions and make suggestions.  Donnice ONEIDA Chess, RN 02/29/2024, 8:36 PM

## 2024-02-29 NOTE — ED Notes (Signed)
 Patient IVC accepted to BMU  320 tonight per note

## 2024-02-29 NOTE — Plan of Care (Signed)
 Pt new to the unit tonight, hasn't had time to progress  Problem: Education: Goal: Knowledge of [place] [facility] Education information/materials will improve Outcome: Not Progressing Goal: Emotional status will improve Outcome: Not Progressing Goal: Mental status will improve Outcome: Not Progressing Goal: Verbalization of understanding the information provided will improve Outcome: Not Progressing   Problem: Activity: Goal: Interest or engagement in activities will improve Outcome: Not Progressing Goal: Sleeping patterns will improve Outcome: Not Progressing   Problem: Coping: Goal: Ability to verbalize frustrations and anger appropriately will improve Outcome: Not Progressing Goal: Ability to demonstrate self-control will improve Outcome: Not Progressing   Problem: Health Behavior/Discharge Planning: Goal: Identification of resources available to assist in meeting health care needs will improve Outcome: Not Progressing Goal: Compliance with treatment plan for underlying cause of condition will improve Outcome: Not Progressing   Problem: Physical Regulation: Goal: Ability to maintain clinical measurements within normal limits will improve Outcome: Not Progressing   Problem: Safety: Goal: Periods of time without injury will increase Outcome: Not Progressing

## 2024-02-29 NOTE — ED Notes (Addendum)
Patient provided shower.

## 2024-02-29 NOTE — ED Notes (Signed)
 IVC/Psych Consult Ordered/Pending/Legal Paperwork Completed scanned into Epic

## 2024-02-29 NOTE — Progress Notes (Signed)
 Pt admitted from [facility] - ED, report received from [facility], [place]. Pt calm and pleasant during assessment denying SI/HI/AVH. Pt presents as responding to internal stimuli. Pt stated she lost her job and hasn't been taking her medications because of it. Pt minimal with staff. Skin assessment completed with Raycha, RN, no abnormalities or contraband found. Pt oriented to the unit and her room. Pt given education, support, and encouragement to be active in her treatment plan. Pt being monitored Q 15 minutes for safety per unit protocol, remains safe on the unit

## 2024-02-29 NOTE — ED Notes (Signed)
 Pt Lunch provided at bedside

## 2024-02-29 NOTE — ED Notes (Signed)
 Pt given behavioral approved sleep eye mask due to sleeping in the hall and the bright lights.

## 2024-02-29 NOTE — Group Note (Signed)
 Date:  02/29/2024 Time:  9:21 PM  Group Topic/Focus:  Relapse Prevention Planning:   The focus of this group is to define relapse and discuss the need for planning to combat relapse. Wrap-Up Group:   The focus of this group is to help patients review their daily goal of treatment and discuss progress on daily workbooks.    Participation Level:  Did Not Attend  Additional Comments:    Kristen VEAR Gibbon 02/29/2024, 9:21 PM

## 2024-02-29 NOTE — ED Notes (Signed)
 Attempted to call report to BMU- still in shift report, call back in 

## 2024-02-29 NOTE — ED Provider Notes (Signed)
 Emergency Medicine Observation Re-evaluation Note   BP (!) 147/102 (BP Location: Right Arm)   Pulse 76   Temp 98.3 F (36.8 C) (Oral)   Resp 16   Ht 5' 4 (1.626 m)   Wt 108.9 kg   SpO2 95%   BMI 41.20 kg/m    ED Course / MDM   No reported events during my shift at the time of this note.   Pt is awaiting dispo from consultants   Ginnie Shams MD    Shams Ginnie, MD 02/29/24 [phone]

## 2024-02-29 NOTE — ED Notes (Signed)
PENDING PSYCH CONSULT.

## 2024-02-29 NOTE — ED Notes (Signed)
 Patient verbally stated that she would let the nurse or NT know if she had any thoughts about harming herself before she did anything to harm herself. 1:1 NT at bedside Lauren Poole, NT Witnessed this conversation between RN and patient.

## 2024-02-29 NOTE — BH Assessment (Signed)
 Patient is to be admitted to [facility] on today 02/29/24 by Dr. Jadapalle.  Attending Physician will be Dr. Jadapalle.   Patient has been assigned to room 320, by [facility] Charge Nurse Orabelle Rylee.    ER staff is aware of the admission: Luann, ER Secretary   Dr. Nicholaus, ER MD  Cherene, Patient's Nurse

## 2024-02-29 NOTE — ED Notes (Deleted)
 IVC patient to BMU -303 tonight

## 2024-02-29 NOTE — ED Notes (Signed)
 Pt nervous about loud outbursts from another pt. Pt given foam ear plugs and returned to sleep.

## 2024-03-01 DIAGNOSIS — F29 Unspecified psychosis not due to a substance or known physiological condition: Secondary | ICD-10-CM

## 2024-03-01 MED ORDER — QUETIAPINE FUMARATE 100 MG PO TABS
100.0000 mg | ORAL_TABLET | Freq: Every day | ORAL | Status: DC
  Administered 2024-03-01 – 2024-03-03 (×3): 100 mg via ORAL
  Filled 2024-03-01 (×3): qty 1

## 2024-03-01 MED ORDER — NICOTINE 14 MG/24HR TD PT24
14.0000 mg | MEDICATED_PATCH | Freq: Every day | TRANSDERMAL | Status: DC
  Administered 2024-03-01: 14 mg via TRANSDERMAL
  Filled 2024-03-01 (×2): qty 1

## 2024-03-01 NOTE — BHH Suicide Risk Assessment (Signed)
 [facility] Admission Suicide Risk Assessment   Nursing information obtained from:  Patient Demographic factors:  NA Current Mental Status:  NA Loss Factors:  NA Historical Factors:  NA Risk Reduction Factors:  NA  Total Time spent with patient: 1 hour Principal Problem: <principal problem not specified> Diagnosis:  Active Problems:   Psychosis (HCC)  Subjective Data: 33 year old female presenting with SI and HI. They are paranoid and delusional on exam. They have been medication noncompliant. Patient requires inpatient psychiatric hospitalization for medication management stabilization due to risk of self injury and danger to others. Patient also severely impaired judgment. Previous diagnoses included bipolar 1 disorder and MDD. On most recent admission there was no prior report of psychotic features. In ED she noted she had not been sleeping.   Continued Clinical Symptoms:  Alcohol Use Disorder Identification Test Final Score (AUDIT): 5 The Alcohol Use Disorders Identification Test, Guidelines for Use in Primary Care, Second Edition.  World Science writer [facility]). Score between 0-7:  no or low risk or alcohol related problems. Score between 8-15:  moderate risk of alcohol related problems. Score between 16-19:  high risk of alcohol related problems. Score 20 or above:  warrants further diagnostic evaluation for alcohol dependence and treatment.   CLINICAL FACTORS:   Currently Psychotic   Musculoskeletal: Strength & Muscle Tone: within normal limits Gait & Station: normal Patient leans: N/A  Psychiatric Specialty Exam:  Presentation  General Appearance:  Casual  Eye Contact: Minimal  Speech: Normal Rate  Speech Volume: Normal  Handedness: Right   Mood and Affect  Mood: Irritable  Affect: Congruent   Thought Process  Thought Processes: Other (comment) (concrete)  Descriptions of Associations:Circumstantial  Orientation:Full (Time, Place and  Person)  Thought Content:Perseveration; Delusions; Paranoid Ideation  History of Schizophrenia/Schizoaffective disorder:No  Duration of Psychotic Symptoms:Less than six months  Hallucinations:Hallucinations: None  Ideas of Reference:Delusions; Paranoia; Percusatory  Suicidal Thoughts:Suicidal Thoughts: -- (si on admission)  Homicidal Thoughts:Homicidal Thoughts: -- (hi on admission)   Sensorium  Memory: Immediate Good  Judgment: Impaired  Insight: Poor   Executive Functions  Concentration: Good  Attention Span: Good  Recall: Good  Fund of Knowledge: Good  Language: Good   Psychomotor Activity  Psychomotor Activity:No data recorded  Assets  Assets: Communication Skills; Social Support; Housing; Leisure Time   Sleep  Sleep: Sleep: Poor    Physical Exam: Physical Exam ROS Blood pressure 128/89, pulse 89, temperature 97.7 F (36.5 C), resp. rate 18, height 5' 4 (1.626 m), weight 108.9 kg, SpO2 98%. Body mass index is 41.21 kg/m.   COGNITIVE FEATURES THAT CONTRIBUTE TO RISK:  Closed-mindedness and Thought constriction (tunnel vision)    SUICIDE RISK:   Moderate:  Frequent suicidal ideation with limited intensity, and duration, some specificity in terms of plans, no associated intent, good self-control, limited dysphoria/symptomatology, some risk factors present, and identifiable protective factors, including available and accessible social support.  PLAN OF CARE:  Safety and Monitoring:   --  Admission to inpatient psychiatric unit for safety, stabilization and treatment -- Daily contact with patient to assess and evaluate symptoms and progress in treatment -- Patient's case to be discussed in multi-disciplinary team meeting -- Observation Level : q15 minute checks -- Vital signs:  q12 hours -- Precautions: suicide   I certify that inpatient services furnished can reasonably be expected to improve the patient's condition.   Donnice FORBES Right, PA-C 03/01/2024, 4:57 PM

## 2024-03-01 NOTE — BH IP Treatment Plan (Signed)
 Interdisciplinary Treatment and Diagnostic Plan Update  03/01/2024 Time of Session: 10:44 AM Lauren Poole MRN: [id number]  Principal Diagnosis: <principal problem not specified>  Secondary Diagnoses: Active Problems:   Psychosis (HCC)   Current Medications:  Current Facility-Administered Medications  Medication Dose Route Frequency Provider Last Rate Last Admin   acetaminophen  (TYLENOL ) tablet 650 mg  650 mg Oral Q6H PRN Donnelly Mellow, MD       alum & mag hydroxide-simeth (MAALOX/MYLANTA) 200-200-20 MG/5ML suspension 30 mL  30 mL Oral Q4H PRN Jadapalle, Sree, MD       haloperidol  lactate (HALDOL ) injection 5 mg  5 mg Intramuscular TID PRN Jadapalle, Sree, MD       And   diphenhydrAMINE  (BENADRYL ) injection 50 mg  50 mg Intramuscular TID PRN Jadapalle, Sree, MD       And   LORazepam  (ATIVAN ) injection 2 mg  2 mg Intramuscular TID PRN Jadapalle, Sree, MD       hydrOXYzine  (ATARAX ) tablet 25 mg  25 mg Oral Q6H PRN Jadapalle, Sree, MD   25 mg at 02/29/24 2116   magnesium  hydroxide (MILK OF MAGNESIA) suspension 30 mL  30 mL Oral Daily PRN Donnelly Mellow, MD       nicotine  (NICODERM CQ  - dosed in mg/24 hours) patch 14 mg  14 mg Transdermal Q0600 Jadapalle, Sree, MD   14 mg at 03/01/24 1008   traZODone  (DESYREL ) tablet 50 mg  50 mg Oral QHS PRN Jadapalle, Sree, MD   50 mg at 02/29/24 2116   PTA Medications: Medications Prior to Admission  Medication Sig Dispense Refill Last Dose/Taking   Cholecalciferol  (VITAMIN D -3) 125 MCG (5000 UT) TABS Take 1 tablet by mouth daily. (Patient not taking: Reported on 02/29/2024) 30 tablet 0    Cholecalciferol  125 MCG (5000 UT) TABS Take 1 tablet (5,000 Units total) by mouth daily. (Patient not taking: Reported on 01/26/2024) 90 tablet 0    escitalopram  (LEXAPRO ) 5 MG tablet Take 1 tablet (5 mg total) by mouth at bedtime. (Patient not taking: Reported on 01/26/2024) 30 tablet 0    HYDROcodone -acetaminophen  (NORCO/VICODIN) 5-325 MG tablet Take 1  tablet by mouth every 6 (six) hours as needed. (Patient not taking: Reported on 02/29/2024) 10 tablet 0    oxyCODONE  (ROXICODONE ) 5 MG immediate release tablet Take 1 tablet (5 mg total) by mouth every 4 (four) hours as needed for severe pain (pain score 7-10). (Patient not taking: Reported on 01/26/2024) 10 tablet 0    traZODone  (DESYREL ) 50 MG tablet Take 1 tablet (50 mg total) by mouth at bedtime. For sleep. (Patient not taking: Reported on 01/26/2024) 30 tablet 0    Vitamin D , Ergocalciferol , (DRISDOL ) 1.25 MG (50000 UNIT) CAPS capsule Take 1 capsule (50,000 Units total) by mouth every 7 (seven) days. (Patient not taking: Reported on 01/26/2024) 5 capsule 0     Patient Stressors: Financial difficulties   Medication change or noncompliance    Patient Strengths: Motivation for treatment/growth  Supportive family/friends   Treatment Modalities: Medication Management, Group therapy, Case management,  1 to 1 session with clinician, Psychoeducation, Recreational therapy.   Physician Treatment Plan for Primary Diagnosis: <principal problem not specified> Long Term Goal(s):     Short Term Goals:    Medication Management: Evaluate patient's response, side effects, and tolerance of medication regimen.  Therapeutic Interventions: 1 to 1 sessions, Unit Group sessions and Medication administration.  Evaluation of Outcomes: Not Met  Physician Treatment Plan for Secondary Diagnosis: Active Problems:   Psychosis (HCC)  Long Term Goal(s):     Short Term Goals:       Medication Management: Evaluate patient's response, side effects, and tolerance of medication regimen.  Therapeutic Interventions: 1 to 1 sessions, Unit Group sessions and Medication administration.  Evaluation of Outcomes: Not Met   RN Treatment Plan for Primary Diagnosis: <principal problem not specified> Long Term Goal(s): Knowledge of disease and therapeutic regimen to maintain health will improve  Short Term Goals: Ability  to verbalize frustration and anger appropriately will improve, Ability to demonstrate self-control, Ability to participate in decision making will improve, Ability to verbalize feelings will improve, Ability to disclose and discuss suicidal ideas, and Ability to identify and develop effective coping behaviors will improve  Medication Management: RN will administer medications as ordered by provider, will assess and evaluate patient's response and provide education to patient for prescribed medication. RN will report any adverse and/or side effects to prescribing provider.  Therapeutic Interventions: 1 on 1 counseling sessions, Psychoeducation, Medication administration, Evaluate responses to treatment, Monitor vital signs and CBGs as ordered, Perform/monitor CIWA, COWS, AIMS and Fall Risk screenings as ordered, Perform wound care treatments as ordered.  Evaluation of Outcomes: Not Met   LCSW Treatment Plan for Primary Diagnosis: <principal problem not specified> Long Term Goal(s): Safe transition to appropriate next level of care at discharge, Engage patient in therapeutic group addressing interpersonal concerns.  Short Term Goals: Engage patient in aftercare planning with referrals and resources, Increase social support, Increase ability to appropriately verbalize feelings, Increase emotional regulation, Facilitate acceptance of mental health diagnosis and concerns, Facilitate patient progression through stages of change regarding substance use diagnoses and concerns, Identify triggers associated with mental health/substance abuse issues, and Increase skills for wellness and recovery  Therapeutic Interventions: Assess for all discharge needs, 1 to 1 time with Social worker, Explore available resources and support systems, Assess for adequacy in community support network, Educate family and significant other(s) on suicide prevention, Complete Psychosocial Assessment, Interpersonal group  therapy.  Evaluation of Outcomes: Not Met   Progress in Treatment: Attending groups: No. Participating in groups: No. Taking medication as prescribed: Yes. Toleration medication: Yes. Family/Significant other contact made: No, will contact:  CSW to contact once permission is granted.  Patient understands diagnosis: Yes. Discussing patient identified problems/goals with staff: Yes. Medical problems stabilized or resolved: Yes. Denies suicidal/homicidal ideation: Yes. Issues/concerns per patient self-inventory: No. Other: None  New problem(s) identified: No, Describe:  None  New Short Term/Long Term Goal(s):detox, elimination of symptoms of psychosis, medication management for mood stabilization; elimination of SI thoughts; development of comprehensive mental wellness/sobriety plan.    Patient Goals:  I want to not be so angry.  Discharge Plan or Barriers: CSW to assist with the development of appropriate discharge plan.    Reason for Continuation of Hospitalization: Aggression Anxiety Delusions  Depression Mania Medical Issues Suicidal ideation  Estimated Length of Stay: 1-7 days.   Last 3 [place] Suicide Severity Risk Score: Flowsheet Row Admission (Current) from 02/29/2024 in [facility] INPATIENT [facility]ICINE ED from 02/28/2024 in [facility] Department at [facility] ED from 01/26/2024 in [facility] Emergency Department at [facility]  C-SSRS RISK CATEGORY No Risk No Risk No Risk    Last PHQ 2/9 Scores:     No data to display          Scribe for Treatment Team: Yannet Rincon M Hristopher Missildine, LCSW 03/01/2024 11:06 AM

## 2024-03-01 NOTE — [facility] Suicide Risk Assessment (Signed)
 [facility] INPATIENT:  Family/Significant Other Suicide Prevention Education  Suicide Prevention Education:  Patient Refusal for Family/Significant Other Suicide Prevention Education: The patient Lauren Poole has refused to provide written consent for family/significant other to be provided Family/Significant Other Suicide Prevention Education during admission and/or prior to discharge.  Physician notified.  SPE completed with pt, as pt refused to consent to family contact. SPI pamphlet provided to pt and pt was encouraged to share information with support network, ask questions, and talk about any concerns relating to SPE. Pt denies access to guns/firearms and verbalized understanding of information provided. Mobile Crisis information also provided to pt.    Lauren Poole 03/01/2024, 11:24 AM

## 2024-03-01 NOTE — Plan of Care (Signed)
  Problem: Education: Goal: Knowledge of [place] [facility] Education information/materials will improve Outcome: Progressing Goal: Mental status will improve Outcome: Progressing   Problem: Activity: Goal: Interest or engagement in activities will improve Outcome: Progressing Goal: Sleeping patterns will improve Outcome: Progressing

## 2024-03-01 NOTE — Group Note (Signed)
 Date:  03/01/2024 Time:  8:30 PM  Group Topic/Focus:  Wrap-Up Group:   The focus of this group is to help patients review their daily goal of treatment and discuss progress on daily workbooks.    Participation Level:  Did Not Attend  Participation Quality:  none  Affect:  none  Cognitive:  none  Insight: None  Engagement in Group:  none  Modes of Intervention:  none  Additional Comments:  none   Kerri Katz 03/01/2024, 8:30 PM

## 2024-03-01 NOTE — Progress Notes (Signed)
   03/01/24 2200  Psych Admission Type (Psych Patients Only)  Admission Status Involuntary  Psychosocial Assessment  Patient Complaints Anxiety  Eye Contact Brief  Facial Expression Anxious  Affect Anxious  Speech Logical/coherent  Interaction Assertive  Motor Activity Slow  Appearance/Hygiene In scrubs  Behavior Characteristics Cooperative  Mood Anxious  Thought Process  Coherency WDL  Content WDL  Delusions None reported or observed  Perception WDL  Hallucination None reported or observed  Judgment Impaired  Confusion None  Danger to Self  Current suicidal ideation? Denies  Agreement Not to Harm Self Yes  Description of Agreement verbal
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
## 2024-03-01 NOTE — Group Note (Signed)

## 2024-03-01 NOTE — Progress Notes (Signed)
   03/01/24 0900  Psych Admission Type (Psych Patients Only)  Admission Status Involuntary  Psychosocial Assessment  Patient Complaints Anxiety;Depression  Eye Contact Brief  Facial Expression Flat  Affect Anxious  Speech Logical/coherent  Interaction Assertive  Motor Activity Other (Comment) (WNL)  Appearance/Hygiene In scrubs  Behavior Characteristics Cooperative  Mood Anxious  Thought Process  Coherency WDL  Content WDL  Delusions None reported or observed  Perception WDL  Hallucination None reported or observed  Judgment Impaired  Confusion None  Danger to Self  Current suicidal ideation? Denies  Agreement Not to Harm Self Yes  Description of Agreement verbal  Danger to Others  Danger to Others None reported or observed

## 2024-03-01 NOTE — H&P (Signed)
 Psychiatric Admission Assessment Adult  Patient Identification: Lauren Poole MRN:  [id number] Date of Evaluation:  03/01/2024 Chief Complaint:  Psychosis [facility]) [F29]   History of Present Illness: 33 year old female patient with a history of bipolar 1 disorder vs mdd.  2 recent hospitalizations this year for paranoid thinking and mood dysregulation.  She was recently discharged in May 2025 on Lexapro  and trazodone  and has been medication noncompliant.  She presented to the ED with SI and paranoia that she has been redline to and that the government is against her and this is why she cannot get a job or have stable housing.  She feels her mother is trying to control her life.  She does not discuss what the plans for suicide were.  She denies SI today.  Has previous self-harm cutting her wrists.  Reported to ED staff that she had not been sleeping however she reports she slept last night.  She has not quantified for sleep.  Currently unemployed.  Reports she drinks a bottle of wine 3 nights a week.  UDS was negative.  BAL on arrival was negative.  Notably in the ED she talked about hurting people and during the exam she talks about she came here because she did not want to hurt people but she is guarded and will not further express on that.  She has made previous threats to harm her mother.  She did not appear internally preoccupied during the exam.  She did frequently seem guarded related to her delusions of persecution and control.  She refused to answer most lines of questioning.  Discussed medications that she initially refused all medications later was agreeable to a trial of Seroquel .  Total Time spent with patient: 1 hour Sleep  Sleep:No data recorded Past Psychiatric History:   Psychiatric History:  Information collected from patient and chart review  Prev Dx/Sx: Bipolar 1 disorder and MDD Current Psych Provider: None Home Meds (current): Noncompliant Previous Med Trials: Lexapro ,  olanzapine , trazodone , hydroxyzine  Therapy: None  Prior Psych Hospitalization: 2 hospitalizations this year Prior Self Harm: Suicide attempt this year Prior Violence: Patient denies  Family Psych History: None reported Family Hx suicide: None reported  Social History:  Developmental Hx: None reported Educational Hx: Unable to assess Occupational Hx: Unemployed Legal Hx: Denies Living Situation: With mother Spiritual Hx: None reported Access to weapons/lethal means: Denied  Substance History Alcohol: Bottle of wine 3 nights a week Denies further substance use and UDS was negative.  Is the patient at risk to self? Yes.    Has the patient been a risk to self in the past 6 months? Yes.    Has the patient been a risk to self within the distant past? No.  Is the patient a risk to others? Yes.    Has the patient been a risk to others in the past 6 months? No.  Has the patient been a risk to others within the distant past? No.   [place] Scale:  Flowsheet Row Admission (Current) from 02/29/2024 in [facility]PAT[facility]ICINE ED from 02/28/2024 in [facility] Emergency Department at [facility] ED from 01/26/2024 in [facility] Emergency Department at [facility]  C-SSRS RISK CATEGORY No Risk No Risk No Risk     Past Medical History:  Past Medical History:  Diagnosis Date   Bipolar 1 disorder (HCC)    Chlamydia    Gonorrhea     Past Surgical History:  Procedure Laterality Date   NO PAST SURGERIES  Family History:  Family History  Problem Relation Age of Onset   Schizophrenia Sister     Social History:  Social History   Substance and Sexual Activity  Alcohol Use Yes   Alcohol/week: 4.0 standard drinks of alcohol   Types: 4 Glasses of wine per week     Social History   Substance and Sexual Activity  Drug Use Not Currently   Types: Marijuana      Allergies:  No Known Allergies Lab Results:  Results for orders placed or performed during  the hospital encounter of 02/28/24 (from the past 48 hours)  Comprehensive metabolic panel     Status: Abnormal   Collection Time: 02/28/24  6:21 PM  Result Value Ref Range   Sodium 138 135 - 145 mmol/L   Potassium 3.6 3.5 - 5.1 mmol/L   Chloride 105 98 - 111 mmol/L   CO2 22 22 - 32 mmol/L   Glucose, Bld 100 (H) 70 - 99 mg/dL    Comment: Glucose reference range applies only to samples taken after fasting for at least 8 hours.   BUN 10 6 - 20 mg/dL   Creatinine, Ser 9.18 0.44 - 1.00 mg/dL   Calcium 9.6 8.9 - 89.6 mg/dL   Total Protein 7.6 6.5 - 8.1 g/dL   Albumin 4.3 3.5 - 5.0 g/dL   AST 30 15 - 41 U/L   ALT 30 0 - 44 U/L   Alkaline Phosphatase 80 38 - 126 U/L   Total Bilirubin 0.7 0.0 - 1.2 mg/dL   GFR, Estimated >39 >39 mL/min    Comment: (NOTE) Calculated using the CKD-EPI Creatinine Equation (2021)    Anion gap 11 5 - 15    Comment: Performed at [facility], [address] [place], [place] [address]  Ethanol     Status: None   Collection Time: 02/28/24  6:21 PM  Result Value Ref Range   Alcohol, Ethyl (B) <15 <15 mg/dL    Comment: (NOTE) For medical purposes only. Performed at [facility], [address] [place], [place] [address]   cbc     Status: None   Collection Time: 02/28/24  6:21 PM  Result Value Ref Range   WBC 5.9 4.0 - 10.5 K/uL   RBC 4.26 3.87 - 5.11 MIL/uL   Hemoglobin 13.5 12.0 - 15.0 g/dL   HCT 60.3 63.9 - 53.9 %   MCV 93.0 80.0 - 100.0 fL   MCH 31.7 26.0 - 34.0 pg   MCHC 34.1 30.0 - 36.0 g/dL   RDW 86.6 88.4 - 84.4 %   Platelets 215 150 - 400 K/uL   nRBC 0.0 0.0 - 0.2 %    Comment: Performed at [facility], [address] [place], [place] [address]  Urine Drug Screen, Qualitative     Status: None   Collection Time: 02/28/24  6:21 PM  Result Value Ref Range   Tricyclic, Ur Screen NONE DETECTED NONE DETECTED   Amphetamines, Ur Screen NONE DETECTED NONE DETECTED   MDMA (Ecstasy)Ur Screen NONE DETECTED NONE  DETECTED   Cocaine Metabolite,Ur [place] NONE DETECTED NONE DETECTED   Opiate, Ur Screen NONE DETECTED NONE DETECTED   Phencyclidine (PCP) Ur S NONE DETECTED NONE DETECTED   Cannabinoid 50 Ng, Ur [place] NONE DETECTED NONE DETECTED   Barbiturates, Ur Screen NONE DETECTED NONE DETECTED   Benzodiazepine, Ur Scrn NONE DETECTED NONE DETECTED   Methadone Scn, Ur NONE DETECTED NONE DETECTED    Comment: (NOTE) Tricyclics +  metabolites, urine    Cutoff 1000 ng/mL Amphetamines + metabolites, urine  Cutoff 1000 ng/mL MDMA (Ecstasy), urine              Cutoff 500 ng/mL Cocaine Metabolite, urine          Cutoff 300 ng/mL Opiate + metabolites, urine        Cutoff 300 ng/mL Phencyclidine (PCP), urine         Cutoff 25 ng/mL Cannabinoid, urine                 Cutoff 50 ng/mL Barbiturates + metabolites, urine  Cutoff 200 ng/mL Benzodiazepine, urine              Cutoff 200 ng/mL Methadone, urine                   Cutoff 300 ng/mL  The urine drug screen provides only a preliminary, unconfirmed analytical test result and should not be used for non-medical purposes. Clinical consideration and professional judgment should be applied to any positive drug screen result due to possible interfering substances. A more specific alternate chemical method must be used in order to obtain a confirmed analytical result. Gas chromatography / mass spectrometry (GC/MS) is the preferred confirm atory method. Performed at [facility], [address] [place], [place] [address]   Salicylate level     Status: Abnormal   Collection Time: 02/28/24  6:22 PM  Result Value Ref Range   Salicylate Lvl <7.0 (L) 7.0 - 30.0 mg/dL    Comment: Performed at [facility], [address] [place], [place] [address]  Acetaminophen  level     Status: Abnormal   Collection Time: 02/28/24  6:22 PM  Result Value Ref Range   Acetaminophen  (Tylenol ), Serum <10 (L) 10 - 30 ug/mL    Comment: (NOTE) Therapeutic concentrations  vary significantly. A range of 10-30 ug/mL  may be an effective concentration for many patients. However, some  are best treated at concentrations outside of this range. Acetaminophen  concentrations >150 ug/mL at 4 hours after ingestion  and >50 ug/mL at 12 hours after ingestion are often associated with  toxic reactions.  Performed at [facility], [address] [place], [place] [address]   POC urine preg, ED     Status: None   Collection Time: 02/28/24  6:30 PM  Result Value Ref Range   Preg Test, Ur Negative Negative    Blood Alcohol level:  Lab Results  Component Value Date   [facility] <15 02/28/2024   ETH <10 11/21/2023    Metabolic Disorder Labs:  Lab Results  Component Value Date   HGBA1C 5.1 10/31/2023   MPG 99.67 10/31/2023   No results found for: PROLACTIN Lab Results  Component Value Date   CHOL 136 10/31/2023   TRIG 75 10/31/2023   HDL 37 (L) 10/31/2023   CHOLHDL 3.7 10/31/2023   VLDL 15 10/31/2023   LDLCALC 84 10/31/2023    Current Medications: Current Facility-Administered Medications  Medication Dose Route Frequency Provider Last Rate Last Admin   acetaminophen  (TYLENOL ) tablet 650 mg  650 mg Oral Q6H PRN Jadapalle, Sree, MD       alum & mag hydroxide-simeth (MAALOX/MYLANTA) 200-200-20 MG/5ML suspension 30 mL  30 mL Oral Q4H PRN Jadapalle, Sree, MD       haloperidol  lactate (HALDOL ) injection 5 mg  5 mg Intramuscular TID PRN Jadapalle, Sree, MD       And   diphenhydrAMINE  (BENADRYL ) injection 50  mg  50 mg Intramuscular TID PRN Jadapalle, Sree, MD       And   LORazepam  (ATIVAN ) injection 2 mg  2 mg Intramuscular TID PRN Jadapalle, Sree, MD       hydrOXYzine  (ATARAX ) tablet 25 mg  25 mg Oral Q6H PRN Jadapalle, Sree, MD   25 mg at 02/29/24 2116   magnesium  hydroxide (MILK OF MAGNESIA) suspension 30 mL  30 mL Oral Daily PRN Donnelly Mellow, MD       nicotine  (NICODERM CQ  - dosed in mg/24 hours) patch 14 mg  14 mg Transdermal Q0600 Jadapalle,  Sree, MD   14 mg at 03/01/24 1008   traZODone  (DESYREL ) tablet 50 mg  50 mg Oral QHS PRN Jadapalle, Sree, MD   50 mg at 02/29/24 2116   PTA Medications: Medications Prior to Admission  Medication Sig Dispense Refill Last Dose/Taking   Cholecalciferol  (VITAMIN D -3) 125 MCG (5000 UT) TABS Take 1 tablet by mouth daily. (Patient not taking: Reported on 02/29/2024) 30 tablet 0    Cholecalciferol  125 MCG (5000 UT) TABS Take 1 tablet (5,000 Units total) by mouth daily. (Patient not taking: Reported on 01/26/2024) 90 tablet 0    escitalopram  (LEXAPRO ) 5 MG tablet Take 1 tablet (5 mg total) by mouth at bedtime. (Patient not taking: Reported on 01/26/2024) 30 tablet 0    HYDROcodone -acetaminophen  (NORCO/VICODIN) 5-325 MG tablet Take 1 tablet by mouth every 6 (six) hours as needed. (Patient not taking: Reported on 02/29/2024) 10 tablet 0    oxyCODONE  (ROXICODONE ) 5 MG immediate release tablet Take 1 tablet (5 mg total) by mouth every 4 (four) hours as needed for severe pain (pain score 7-10). (Patient not taking: Reported on 01/26/2024) 10 tablet 0    traZODone  (DESYREL ) 50 MG tablet Take 1 tablet (50 mg total) by mouth at bedtime. For sleep. (Patient not taking: Reported on 01/26/2024) 30 tablet 0    Vitamin D , Ergocalciferol , (DRISDOL ) 1.25 MG (50000 UNIT) CAPS capsule Take 1 capsule (50,000 Units total) by mouth every 7 (seven) days. (Patient not taking: Reported on 01/26/2024) 5 capsule 0     Psychiatric Specialty Exam:  Presentation  General Appearance:  Appropriate for Environment  Eye Contact: Good  Speech: Clear and Coherent; Normal Rate  Speech Volume: Normal    Mood and Affect  Mood: Euthymic  Affect: Congruent   Thought Process  Thought Processes: Linear  Descriptions of Associations:Intact  Orientation:Full (Time, Place and Person)  Thought Content:Logical  Hallucinations:No data recorded Ideas of Reference:None  Suicidal Thoughts:No data recorded Homicidal Thoughts:No  data recorded  Sensorium  Memory: Immediate Good  Judgment: Fair  Insight: Fair   Art therapist  Concentration: Good  Attention Span: Good  Recall: Good  Fund of Knowledge: Good  Language: Good   Psychomotor Activity  Psychomotor Activity:No data recorded  Assets  Assets: Communication Skills; Social Support; Housing; Leisure Time    Musculoskeletal: Strength & Muscle Tone: within normal limits Gait & Station: normal  Physical Exam: Physical Exam Vitals and nursing note reviewed.  HENT:     Head: Atraumatic.  Eyes:     Extraocular Movements: Extraocular movements intact.  Pulmonary:     Effort: Pulmonary effort is normal.  Neurological:     Mental Status: She is alert and oriented to person, place, and time.    Review of Systems  Psychiatric/Behavioral:  Positive for suicidal ideas. Negative for substance abuse. The patient has insomnia.    Blood pressure 117/76, pulse 79, temperature 97.9 F (36.6  C), resp. rate 16, height 5' 4 (1.626 m), weight 108.9 kg, SpO2 93%. Body mass index is 41.21 kg/m.  Principal Diagnosis: <principal problem not specified> Diagnosis:  Active Problems:   Psychosis (HCC)   Clinical Decision Making: 33 year old female presenting with SI and HI.  They are paranoid and delusional on exam.  They have been medication noncompliant.  Patient requires inpatient psychiatric hospitalization for medication management stabilization due to risk of self injury and danger to others.  Patient also severely impaired judgment.  Previous diagnoses included bipolar 1 disorder and MDD.  On most recent admission there was no prior report of psychotic features.  In ED she noted she had not been sleeping.  Treatment Plan Summary:  Safety and Monitoring:             -- Voluntary admission to inpatient psychiatric unit for safety, stabilization and treatment             -- Daily contact with patient to assess and evaluate symptoms and  progress in treatment             -- Patient's case to be discussed in multi-disciplinary team meeting             -- Observation Level: q15 minute checks             -- Vital signs:  q12 hours             -- Precautions: suicide, elopement, and assault   2. Psychiatric Diagnoses and Treatment:               Bipolar 1 disorder mixed episode Start Seroquel  100 mg nightly   -- The risks/benefits/side-effects/alternatives to this medication were discussed in detail with the patient and time was given for questions. The patient consents to medication trial.                -- Metabolic profile and EKG monitoring obtained while on an atypical antipsychotic (BMI: Lipid Panel: HbgA1c: QTc:)              -- Encouraged patient to participate in unit milieu and in scheduled group therapies                            3. Medical Issues Being Addressed:    No acute concerns noted 4. Discharge Planning:              -- Social work and case management to assist with discharge planning and identification of hospital follow-up needs prior to discharge             -- Estimated LOS: 5-7 days             -- Discharge Concerns: Need to establish a safety plan; Medication compliance and effectiveness             -- Discharge Goals: Return home with outpatient referrals follow ups  Physician Treatment Plan for Primary Diagnosis: <principal problem not specified> Long Term Goal(s): Improvement in symptoms so as ready for discharge  Short Term Goals: Ability to verbalize feelings will improve, Ability to disclose and discuss suicidal ideas, Ability to maintain clinical measurements within normal limits will improve, Compliance with prescribed medications will improve, and Ability to identify triggers associated with substance abuse/mental health issues will improve  I certify that inpatient services furnished can reasonably be expected to improve the patient's condition.    Donnice FORBES Right,  PA-C 7/16/20251:10 PM

## 2024-03-01 NOTE — Plan of Care (Signed)
 Pt new to the unit tonight, hasn't had time to progress  Problem: Education: Goal: Knowledge of [facility] [place] [facility] Education information/materials will improve Outcome: Not Progressing Goal: Emotional status will improve 03/01/2024 0558 by Merriam Donnice DASEN, RN Outcome: Not Progressing 02/29/2024 2035 by Merriam Donnice DASEN, RN Outcome: Not Progressing Goal: Mental status will improve 03/01/2024 0558 by Merriam Donnice DASEN, RN Outcome: Not Progressing 02/29/2024 2035 by Merriam Donnice DASEN, RN Outcome: Not Progressing Goal: Verbalization of understanding the information provided will improve Outcome: Not Progressing   Problem: Activity: Goal: Interest or engagement in activities will improve Outcome: Not Progressing Goal: Sleeping patterns will improve Outcome: Not Progressing   Problem: Coping: Goal: Ability to verbalize frustrations and anger appropriately will improve Outcome: Not Progressing Goal: Ability to demonstrate self-control will improve Outcome: Not Progressing   Problem: Health Behavior/Discharge Planning: Goal: Identification of resources available to assist in meeting health care needs will improve Outcome: Not Progressing Goal: Compliance with treatment plan for underlying cause of condition will improve Outcome: Not Progressing   Problem: Physical Regulation: Goal: Ability to maintain clinical measurements within normal limits will improve Outcome: Not Progressing   Problem: Safety: Goal: Periods of time without injury will increase Outcome: Not Progressing

## 2024-03-01 NOTE — Plan of Care (Signed)
  Problem: Education: Goal: Mental status will improve Outcome: Progressing   Problem: Activity: Goal: Sleeping patterns will improve Outcome: Progressing   Problem: Coping: Goal: Ability to verbalize frustrations and anger appropriately will improve Outcome: Progressing   Problem: Safety: Goal: Periods of time without injury will increase Outcome: Progressing

## 2024-03-01 NOTE — Group Note (Signed)
 Date:  03/01/2024 Time:  5:30 PM  Group Topic/Focus:  Goals Group:   The focus of this group is to help patients establish daily goals to achieve during treatment and discuss how the patient can incorporate goal setting into their daily lives to aide in recovery.    Participation Level:  Minimal  Participation Quality:  Appropriate  Affect:  Appropriate  Cognitive:  Appropriate  Insight: Appropriate  Engagement in Group:  Engaged  Modes of Intervention:  Discussion, Education, and Support  Additional Comments:    Deitra Caron Mainland 03/01/2024, 5:30 PM

## 2024-03-02 DIAGNOSIS — F29 Unspecified psychosis not due to a substance or known physiological condition: Secondary | ICD-10-CM

## 2024-03-02 MED ORDER — VITAMIN D 25 MCG (1000 UNIT) PO TABS
1000.0000 [IU] | ORAL_TABLET | Freq: Every day | ORAL | Status: DC
  Administered 2024-03-03 – 2024-03-08 (×6): 1000 [IU] via ORAL
  Filled 2024-03-02 (×5): qty 1

## 2024-03-02 NOTE — Group Note (Signed)
 Date:  03/02/2024 Time:  10:43 AM  Group Topic/Focus:   Goals Group:   The focus of this group is to help patients establish daily goals to achieve during treatment and discuss how the patient can incorporate goal setting into their daily lives to aide in recovery.  Self Care:   The focus of this group is to help patients understand the importance of self-care in order to improve or restore emotional, physical, spiritual, interpersonal, and financial health.  Participation Level:  Active  Participation Quality:  Appropriate  Affect:  Appropriate  Cognitive:  Appropriate  Insight: Appropriate  Engagement in Group:  Engaged  Modes of Intervention:  Activity, Discussion, and Education  Additional Comments:    Lauren Poole [name] 03/02/2024, 10:43 AM

## 2024-03-02 NOTE — Group Note (Signed)
 Recreation Therapy Group Note   Group Topic:Coping Skills  Group Date: 03/02/2024 Start Time: 1530 End Time: 1620 Facilitators: Celestia Jeoffrey FORBES ARTICE, CTRS Location: Craft Room  Group Description: Coping A-Z. LRT and patients engage in a guided discussion on what coping skills are and gave specific examples. LRT passed out a handout labeled Coping A-Z with blank spaces beside each letter. LRT prompted patients to come up with a coping skill for each of the letters. LRT and patients went over the handout and gave ideas for each letter if anyone had any blanks left on their paper. Patients kept this handout with them that listed 26 different coping skills.   Goal Area(s) Addressed: Patients will be able to define "coping skills". Patient will identify new coping skills.  Patient will increase communication.   Affect/Mood: Appropriate   Participation Level: Active and Engaged   Participation Quality: Independent   Behavior: Appropriate, Calm, and Cooperative   Speech/Thought Process: Coherent   Insight: Good   Judgement: Good   Modes of Intervention: Education, Worksheet, and Writing   Patient Response to Interventions:  Attentive, Engaged, Interested , and Receptive   Education Outcome:  Acknowledges education   Clinical Observations/Individualized Feedback: Lauren Poole was active in their participation of session activities and group discussion. Pt identified cooking and walking as coping skills.    Plan: Continue to engage patient in RT group sessions 2-3x/week.   Jeoffrey FORBES Celestia, LRT, CTRS 03/02/2024 4:57 PM

## 2024-03-02 NOTE — Plan of Care (Signed)

## 2024-03-02 NOTE — Progress Notes (Signed)
 Upon entering pt's room, pt was noted to be standing in a chair with left foot and right foot planted on window seal; pt stated, I'm just watching TV.  Oh I'm not supposed to say TV.  RN advised pt for her safety to please come down.  Pt agreed and when questioned, pt reported that hydroxizine helped her earlier.

## 2024-03-02 NOTE — Progress Notes (Signed)
 [facility] MD Progress Note  03/02/2024 8:29 PM Laressa Bolinger  MRN:  [id number]   Subjective:  Chart reviewed, case discussed in multidisciplinary meeting, patient seen during rounds.   Patient seen today for follow-up care alert and oriented x 3.  More cooperative today on exam.  He still has some paranoia and discussed that they have been redline and are unable to find employment or housing.  Feeling they are being targeted.  They are more linear on exam today and answer questions appropriately.  They are observed participating in the unit milieu.  He indicates they have been performing their ADLs specifically bathing, feeding, and oral care.  They deny depressive symptoms.  They deny thoughts of harming himself or others.  They indicate they are unclear where they would go at the time of discharge but that they do not have very many options.  They do not appear internally preoccupied nor the observed responding to internal stimuli.   Sleep: Good  Appetite:  Fair  Past Psychiatric History: see h&P Family History:  Family History  Problem Relation Age of Onset   Schizophrenia Sister    Social History:  Social History   Substance and Sexual Activity  Alcohol Use Yes   Alcohol/week: 4.0 standard drinks of alcohol   Types: 4 Glasses of wine per week     Social History   Substance and Sexual Activity  Drug Use Not Currently   Types: Marijuana    Social History   Socioeconomic History   Marital status: Single    Spouse name: Not on file   Number of children: Not on file   Years of education: Not on file   Highest education level: Not on file  Occupational History   Not on file  Tobacco Use   Smoking status: Every Day    Current packs/day: 0.50    Average packs/day: 0.5 packs/day for 6.4 years (3.2 ttl pk-yrs)    Types: Cigarettes    Start date: 10/2017   Smokeless tobacco: Never  Vaping Use   Vaping status: Never Used  Substance and Sexual Activity   Alcohol use: Yes     Alcohol/week: 4.0 standard drinks of alcohol    Types: 4 Glasses of wine per week   Drug use: Not Currently    Types: Marijuana   Sexual activity: Yes    Birth control/protection: None  Other Topics Concern   Not on file  Social History Narrative   Not on file   Social Drivers of Health   Financial Resource Strain: Not on file  Food Insecurity: No Food Insecurity (02/29/2024)   Hunger Vital Sign    Worried About Running Out of Food in the Last Year: Never true    Ran Out of Food in the Last Year: Never true  Transportation Needs: No Transportation Needs (02/29/2024)   PRAPARE - Administrator, Civil Service (Medical): No    Lack of Transportation (Non-Medical): No  Physical Activity: Not on file  Stress: Not on file  Social Connections: Not on file   Past Medical History:  Past Medical History:  Diagnosis Date   Bipolar 1 disorder (HCC)    Chlamydia    Gonorrhea     Past Surgical History:  Procedure Laterality Date   NO PAST SURGERIES      Current Medications: Current Facility-Administered Medications  Medication Dose Route Frequency Provider Last Rate Last Admin   acetaminophen  (TYLENOL ) tablet 650 mg  650 mg Oral Q6H PRN Jadapalle,  Sree, MD       alum & mag hydroxide-simeth (MAALOX/MYLANTA) 200-200-20 MG/5ML suspension 30 mL  30 mL Oral Q4H PRN Jadapalle, Sree, MD       haloperidol  lactate (HALDOL ) injection 5 mg  5 mg Intramuscular TID PRN Jadapalle, Sree, MD       And   diphenhydrAMINE  (BENADRYL ) injection 50 mg  50 mg Intramuscular TID PRN Jadapalle, Sree, MD       And   LORazepam  (ATIVAN ) injection 2 mg  2 mg Intramuscular TID PRN Jadapalle, Sree, MD       hydrOXYzine  (ATARAX ) tablet 25 mg  25 mg Oral Q6H PRN Jadapalle, Sree, MD   25 mg at 03/02/24 1236   magnesium  hydroxide (MILK OF MAGNESIA) suspension 30 mL  30 mL Oral Daily PRN Donnelly Mellow, MD       QUEtiapine  (SEROQUEL ) tablet 100 mg  100 mg Oral QHS Kayse Puccini E, PA-C   100 mg at  03/01/24 2132   traZODone  (DESYREL ) tablet 50 mg  50 mg Oral QHS PRN Jadapalle, Sree, MD   50 mg at 02/29/24 2116    Lab Results: No results found for this or any previous visit (from the past 48 hours).  Blood Alcohol level:  Lab Results  Component Value Date   [facility] <15 02/28/2024   ETH <10 11/21/2023    Metabolic Disorder Labs: Lab Results  Component Value Date   HGBA1C 5.1 10/31/2023   MPG 99.67 10/31/2023   No results found for: PROLACTIN Lab Results  Component Value Date   CHOL 136 10/31/2023   TRIG 75 10/31/2023   HDL 37 (L) 10/31/2023   CHOLHDL 3.7 10/31/2023   VLDL 15 10/31/2023   LDLCALC 84 10/31/2023    Physical Findings: AIMS:  , ,  ,  ,    CIWA:    COWS:      Psychiatric Specialty Exam:  Presentation  General Appearance:  Casual  Eye Contact: Minimal  Speech: Normal Rate  Speech Volume: Normal    Mood and Affect  Mood: Irritable  Affect: Congruent   Thought Process  Thought Processes: Other (comment) (concrete)  Descriptions of Associations:Circumstantial  Orientation:Full (Time, Place and Person)  Thought Content:Perseveration; Delusions; Paranoid Ideation  Hallucinations:Hallucinations: None  Ideas of Reference:Delusions; Paranoia; Percusatory  Suicidal Thoughts:Suicidal Thoughts: -- (si on admission)  Homicidal Thoughts:Homicidal Thoughts: -- (hi on admission)   Sensorium  Memory: Immediate Good  Judgment: Impaired  Insight: Poor   Executive Functions  Concentration: Good  Attention Span: Good  Recall: Good  Fund of Knowledge: Good  Language: Good   Psychomotor Activity  Psychomotor Activity:No data recorded Musculoskeletal: Strength & Muscle Tone: within normal limits Gait & Station: normal Assets  Assets: Manufacturing systems engineer; Social Support; Housing; Leisure Time    Physical Exam: Physical Exam Vitals and nursing note reviewed.  HENT:     Head: Atraumatic.  Eyes:      Extraocular Movements: Extraocular movements intact.  Pulmonary:     Effort: Pulmonary effort is normal.  Neurological:     Mental Status: She is alert and oriented to person, place, and time.    Review of Systems  Psychiatric/Behavioral:  Negative for hallucinations and suicidal ideas. The patient is nervous/anxious. The patient does not have insomnia.    Blood pressure (!) 127/90, pulse 71, temperature (!) 97.5 F (36.4 C), resp. rate 20, height 5' 4 (1.626 m), weight 108.9 kg, SpO2 99%. Body mass index is 41.21 kg/m.  Diagnosis: Active Problems:  Psychosis (HCC)   PLAN: Safety and Monitoring:  -- Voluntary admission to inpatient psychiatric unit for safety, stabilization and treatment  -- Daily contact with patient to assess and evaluate symptoms and progress in treatment  -- Patient's case to be discussed in multi-disciplinary team meeting  -- Observation Level : q15 minute checks  -- Vital signs:  q12 hours  -- Precautions: suicide, elopement, and assault -- Encouraged patient to participate in unit milieu and in scheduled group therapies  2. Psychiatric Diagnoses and Treatment:               Bipolar 1 disorder mixed episode Continue Seroquel  100 mg nightly   -- The risks/benefits/side-effects/alternatives to this medication were discussed in detail with the patient and time was given for questions. The patient consents to medication trial.                -- Metabolic profile and EKG monitoring obtained while on an atypical antipsychotic (BMI: Lipid Panel: HbgA1c: QTc:)              -- Encouraged patient to participate in unit milieu and in scheduled group therapies                            3. Medical Issues Being Addressed:    No acute concerns noted Restarted home vitamin D   4. Discharge Planning:   -- Social work and case management to assist with discharge planning and identification of hospital follow-up needs prior to discharge  -- Estimated LOS: 3-4  days  Donnice FORBES Right, PA-C 03/02/2024, 8:29 PM

## 2024-03-02 NOTE — Group Note (Signed)
 [facility] LCSW Group Therapy Note   Group Date: 03/02/2024 Start Time: 1300 End Time: 1400   Type of Therapy/Topic:  Group Therapy:  Emotion Regulation  Participation Level:  Did Not Attend   Mood:  Description of Group:    The purpose of this group is to assist patients in learning to regulate negative emotions and experience positive emotions. Patients will be guided to discuss ways in which they have been vulnerable to their negative emotions. These vulnerabilities will be juxtaposed with experiences of positive emotions or situations, and patients challenged to use positive emotions to combat negative ones. Special emphasis will be placed on coping with negative emotions in conflict situations, and patients will process healthy conflict resolution skills.  Therapeutic Goals: Patient will identify two positive emotions or experiences to reflect on in order to balance out negative emotions:  Patient will label two or more emotions that they find the most difficult to experience:  Patient will be able to demonstrate positive conflict resolution skills through discussion or role plays:   Summary of Patient Progress:   Patient did not attend group.     Therapeutic Modalities:   Cognitive Behavioral Therapy Feelings Identification Dialectical Behavioral Therapy   Alveta CHRISTELLA Kerns, LCSW

## 2024-03-02 NOTE — Group Note (Unsigned)
 Date:  03/02/2024 Time:  9:46 PM  Group Topic/Focus:  Wrap-Up Group:   The focus of this group is to help patients review their daily goal of treatment and discuss progress on daily workbooks.     Participation Level:  {[facility] PARTICIPATION OZCZO:77735}  Participation Quality:  {[facility] PARTICIPATION QUALITY:22265}  Affect:  {[facility] AFFECT:22266}  Cognitive:  {[facility] COGNITIVE:22267}  Insight: {[facility] Insight2:20797}  Engagement in Group:  {[facility] ENGAGEMENT IN HMNLE:77731}  Modes of Intervention:  {[facility] MODES OF INTERVENTION:22269}  Additional Comments:  ***  Lauren Poole 03/02/2024, 9:46 PM

## 2024-03-02 NOTE — Group Note (Signed)
 Recreation Therapy Group Note   Group Topic:Other  Group Date: 03/02/2024 Start Time: 1000 End Time: 1045 Facilitators: Celestia Jeoffrey BRAVO, LRT, CTRS Location: Courtyard  Group Description: [employer]. LRT and patients played games of basketball, drew with chalk, and played corn hole while outside in the courtyard while getting fresh air and sunlight. Music was being played in the background. LRT and peers conversed about different games they have played before, what they do in their free time and anything else that is on their minds. LRT encouraged pts to drink water after being outside, sweating and getting their heart rate up.  Goal Area(s) Addressed: Patient will build on frustration tolerance skills. Patients will partake in a competitive play game with peers. Patients will gain knowledge of new leisure interest/hobby.    Affect/Mood: Appropriate   Participation Level: Active and Engaged   Participation Quality: Independent   Behavior: Calm and Cooperative   Speech/Thought Process: Loose association   Insight: Fair   Judgement: Fair    Modes of Intervention: Music, Open Conversation, Rapport Building, and Socialization   Patient Response to Interventions:  Attentive, Engaged, and Receptive   Education Outcome:  Acknowledges education   Clinical Observations/Individualized Feedback: Maie was active in their participation of session activities and group discussion. Pt interacted well with LRT and peers duration of session.    Plan: Continue to engage patient in RT group sessions 2-3x/week.   Jeoffrey BRAVO Celestia, LRT, CTRS 03/02/2024 11:13 AM

## 2024-03-02 NOTE — Group Note (Signed)
 Date:  03/02/2024 Time:  8:55 PM  Group Topic/Focus:  Goals Group:   The focus of this group is to help patients establish daily goals to achieve during treatment and discuss how the patient can incorporate goal setting into their daily lives to aide in recovery.    Participation Level:  Active  Participation Quality:  Appropriate, Attentive, and Sharing  Affect:  Appropriate  Cognitive:  Appropriate  Insight: Appropriate  Engagement in Group:  Engaged  Modes of Intervention:  Discussion  Additional Comments:     Kerri Katz 03/02/2024, 8:55 PM

## 2024-03-02 NOTE — Progress Notes (Signed)
   03/02/24 0840  Psych Admission Type (Psych Patients Only)  Admission Status Involuntary  Psychosocial Assessment  Patient Complaints Agitation;Anxiety  Eye Contact Brief  Facial Expression Anxious;Flat  Affect Anxious  Speech Logical/coherent  Interaction Assertive  Motor Activity Slow  Appearance/Hygiene In scrubs  Behavior Characteristics Cooperative  Mood Anxious  Aggressive Behavior  Effect No apparent injury  Thought Process  Coherency WDL  Content WDL  Delusions None reported or observed  Perception WDL  Hallucination None reported or observed  Judgment Poor  Confusion None  Danger to Self  Current suicidal ideation? Denies  Agreement Not to Harm Self Yes  Description of Agreement verbal  Danger to Others  Danger to Others None reported or observed

## 2024-03-03 DIAGNOSIS — F315 Bipolar disorder, current episode depressed, severe, with psychotic features: Secondary | ICD-10-CM | POA: Diagnosis not present

## 2024-03-03 NOTE — Progress Notes (Signed)
   03/03/24 0900  Psych Admission Type (Psych Patients Only)  Admission Status Involuntary  Psychosocial Assessment  Patient Complaints None  Eye Contact Brief  Facial Expression Flat  Affect Appropriate to circumstance  Speech Logical/coherent  Interaction Assertive  Motor Activity Slow  Appearance/Hygiene In scrubs  Behavior Characteristics Cooperative  Mood Pleasant  Thought Process  Coherency WDL  Content WDL  Delusions None reported or observed  Perception WDL  Hallucination None reported or observed  Judgment Limited  Confusion None  Danger to Self  Current suicidal ideation? Denies  Agreement Not to Harm Self Yes  Description of Agreement verbal  Danger to Others  Danger to Others None reported or observed

## 2024-03-03 NOTE — Progress Notes (Signed)
 [facility] MD Progress Note  03/03/2024 4:09 PM Lauren Poole  MRN:  [id number]   Subjective:  Chart reviewed, case discussed in multidisciplinary meeting, patient seen during rounds.   7/18: Patient seen for follow-up today.  They are alert and oriented.  They are pleasant and cooperative on exam today.  They report they have been attending groups and participating the unit milieu.  They report they are tolerating medications without adverse effect.  They report they are working on developing coping mechanisms.  They have been coloring a lot since they have been here and they have multiple pages of art work.  They indicate they are performing their ADLs.  They deny depressive symptoms.  There is some ongoing paranoia patient now believes that is best for her to go back to her mother's at discharge.  She does not appear internally preoccupied during today's exam nor she observed responding to internal stimuli.  She still believes that she has been redline and there are people against her.  She denies SI and HI.  She denies AVH.  7/17 Patient seen today for follow-up care alert and oriented x 3.  More cooperative today on exam.  He still has some paranoia and discussed that they have been redline and are unable to find employment or housing.  Feeling they are being targeted.  They are more linear on exam today and answer questions appropriately.  They are observed participating in the unit milieu.  He indicates they have been performing their ADLs specifically bathing, feeding, and oral care.  They deny depressive symptoms.  They deny thoughts of harming himself or others.  They indicate they are unclear where they would go at the time of discharge but that they do not have very many options.  They do not appear internally preoccupied nor the observed responding to internal stimuli.   Sleep: Good  Appetite:  Fair  Past Psychiatric History: see h&P Family History:  Family History  Problem Relation  Age of Onset   Schizophrenia Sister    Social History:  Social History   Substance and Sexual Activity  Alcohol Use Yes   Alcohol/week: 4.0 standard drinks of alcohol   Types: 4 Glasses of wine per week     Social History   Substance and Sexual Activity  Drug Use Not Currently   Types: Marijuana    Social History   Socioeconomic History   Marital status: Single    Spouse name: Not on file   Number of children: Not on file   Years of education: Not on file   Highest education level: Not on file  Occupational History   Not on file  Tobacco Use   Smoking status: Every Day    Current packs/day: 0.50    Average packs/day: 0.5 packs/day for 6.4 years (3.2 ttl pk-yrs)    Types: Cigarettes    Start date: 10/2017   Smokeless tobacco: Never  Vaping Use   Vaping status: Never Used  Substance and Sexual Activity   Alcohol use: Yes    Alcohol/week: 4.0 standard drinks of alcohol    Types: 4 Glasses of wine per week   Drug use: Not Currently    Types: Marijuana   Sexual activity: Yes    Birth control/protection: None  Other Topics Concern   Not on file  Social History Narrative   Not on file   Social Drivers of Health   Financial Resource Strain: Not on file  Food Insecurity: No Food Insecurity (02/29/2024)  Hunger Vital Sign    Worried About Running Out of Food in the Last Year: Never true    Ran Out of Food in the Last Year: Never true  Transportation Needs: No Transportation Needs (02/29/2024)   PRAPARE - Administrator, Civil Service (Medical): No    Lack of Transportation (Non-Medical): No  Physical Activity: Not on file  Stress: Not on file  Social Connections: Not on file   Past Medical History:  Past Medical History:  Diagnosis Date   Bipolar 1 disorder (HCC)    Chlamydia    Gonorrhea     Past Surgical History:  Procedure Laterality Date   NO PAST SURGERIES      Current Medications: Current Facility-Administered Medications  Medication  Dose Route Frequency Provider Last Rate Last Admin   acetaminophen  (TYLENOL ) tablet 650 mg  650 mg Oral Q6H PRN Donnelly Mellow, MD       alum & mag hydroxide-simeth (MAALOX/MYLANTA) 200-200-20 MG/5ML suspension 30 mL  30 mL Oral Q4H PRN Jadapalle, Sree, MD       cholecalciferol  (VITAMIN D3) 25 MCG (1000 UNIT) tablet 1,000 Units  1,000 Units Oral Daily Isaias Dowson E, PA-C   1,000 Units at 03/03/24 9066   haloperidol  lactate (HALDOL ) injection 5 mg  5 mg Intramuscular TID PRN Jadapalle, Sree, MD       And   diphenhydrAMINE  (BENADRYL ) injection 50 mg  50 mg Intramuscular TID PRN Jadapalle, Sree, MD       And   LORazepam  (ATIVAN ) injection 2 mg  2 mg Intramuscular TID PRN Jadapalle, Sree, MD       hydrOXYzine  (ATARAX ) tablet 25 mg  25 mg Oral Q6H PRN Jadapalle, Sree, MD   25 mg at 03/03/24 1216   magnesium  hydroxide (MILK OF MAGNESIA) suspension 30 mL  30 mL Oral Daily PRN Donnelly Mellow, MD       QUEtiapine  (SEROQUEL ) tablet 100 mg  100 mg Oral QHS Girlie Veltri E, PA-C   100 mg at 03/02/24 2109   traZODone  (DESYREL ) tablet 50 mg  50 mg Oral QHS PRN Jadapalle, Sree, MD   50 mg at 02/29/24 2116    Lab Results: No results found for this or any previous visit (from the past 48 hours).  Blood Alcohol level:  Lab Results  Component Value Date   [facility] <15 02/28/2024   ETH <10 11/21/2023    Metabolic Disorder Labs: Lab Results  Component Value Date   HGBA1C 5.1 10/31/2023   MPG 99.67 10/31/2023   No results found for: PROLACTIN Lab Results  Component Value Date   CHOL 136 10/31/2023   TRIG 75 10/31/2023   HDL 37 (L) 10/31/2023   CHOLHDL 3.7 10/31/2023   VLDL 15 10/31/2023   LDLCALC 84 10/31/2023    Physical Findings: AIMS:  , ,  ,  ,    CIWA:    COWS:      Psychiatric Specialty Exam:  Presentation  General Appearance:  Casual  Eye Contact: Minimal  Speech: Normal Rate  Speech Volume: Normal    Mood and Affect   Mood: Irritable  Affect: Congruent   Thought Process  Thought Processes: Other (comment) (concrete)  Descriptions of Associations:Circumstantial  Orientation:Full (Time, Place and Person)  Thought Content:Perseveration; Delusions; Paranoid Ideation  Hallucinations:No data recorded  Ideas of Reference:Delusions; Paranoia; Percusatory  Suicidal Thoughts:No data recorded  Homicidal Thoughts:No data recorded   Sensorium  Memory: Immediate Good  Judgment: Impaired  Insight: Poor   Executive  Functions  Concentration: Good  Attention Span: Good  Recall: Good  Fund of Knowledge: Good  Language: Good   Psychomotor Activity  Psychomotor Activity:No data recorded Musculoskeletal: Strength & Muscle Tone: within normal limits Gait & Station: normal Assets  Assets: Manufacturing systems engineer; Social Support; Housing; Leisure Time    Physical Exam: Physical Exam Vitals and nursing note reviewed.  HENT:     Head: Atraumatic.  Eyes:     Extraocular Movements: Extraocular movements intact.  Pulmonary:     Effort: Pulmonary effort is normal.  Neurological:     Mental Status: She is alert and oriented to person, place, and time.    Review of Systems  Psychiatric/Behavioral:  Negative for hallucinations and suicidal ideas. The patient is nervous/anxious. The patient does not have insomnia.    Blood pressure (!) 127/90, pulse 71, temperature (!) 97.5 F (36.4 C), resp. rate 20, height 5' 4 (1.626 m), weight 108.9 kg, SpO2 99%. Body mass index is 41.21 kg/m.  Diagnosis: Active Problems:   Psychosis (HCC)   PLAN: Safety and Monitoring:  -- Voluntary admission to inpatient psychiatric unit for safety, stabilization and treatment  -- Daily contact with patient to assess and evaluate symptoms and progress in treatment  -- Patient's case to be discussed in multi-disciplinary team meeting  -- Observation Level : q15 minute checks  -- Vital signs:  q12  hours  -- Precautions: suicide, elopement, and assault -- Encouraged patient to participate in unit milieu and in scheduled group therapies  2. Psychiatric Diagnoses and Treatment:               Bipolar 1 disorder mixed episode Continue Seroquel  100 mg nightly   -- The risks/benefits/side-effects/alternatives to this medication were discussed in detail with the patient and time was given for questions. The patient consents to medication trial.                -- Metabolic profile and EKG monitoring obtained while on an atypical antipsychotic (BMI: Lipid Panel: HbgA1c: QTc:)              -- Encouraged patient to participate in unit milieu and in scheduled group therapies                            3. Medical Issues Being Addressed:    No acute concerns noted Restarted home vitamin D   4. Discharge Planning:   -- Social work and case management to assist with discharge planning and identification of hospital follow-up needs prior to discharge  -- Estimated LOS: 3-4 days  Donnice FORBES Right, PA-C 03/03/2024, 4:09 PM

## 2024-03-03 NOTE — Group Note (Signed)
 Date:  03/03/2024 Time:  2:54 PM  Group Topic/Focus:  Conflict Resolution:   The focus of this group is to discuss the conflict resolution process and how it may be used upon discharge.    Participation Level:  Did Not Attend   Camellia [name] Ashlin Hidalgo 03/03/2024, 2:54 PM

## 2024-03-03 NOTE — Plan of Care (Signed)
  Problem: Education: Goal: Knowledge of  General Education information/materials will improve 03/03/2024 2336 by Ann Orlean LABOR, RN Outcome: Progressing 03/03/2024 2103 by Ann Orlean LABOR, RN Outcome: Progressing Goal: Emotional status will improve Outcome: Progressing Goal: Mental status will improve 03/03/2024 2336 by Ann Orlean LABOR, RN Outcome: Not Met (add Reason) 03/03/2024 2103 by Ann Orlean LABOR, RN Outcome: Progressing   Problem: Activity: Goal: Interest or engagement in activities will improve 03/03/2024 2336 by Ann Orlean LABOR, RN Outcome: Progressing 03/03/2024 2103 by Ann Orlean LABOR, RN Outcome: Progressing   Problem: Coping: Goal: Ability to verbalize frustrations and anger appropriately will improve 03/03/2024 2336 by Ann Orlean LABOR, RN Outcome: Progressing 03/03/2024 2103 by Ann Orlean LABOR, RN Outcome: Progressing   Problem: Safety: Goal: Periods of time without injury will increase 03/03/2024 2336 by Ann Orlean LABOR, RN Outcome: Progressing 03/03/2024 2103 by Ann Orlean LABOR, RN Outcome: Progressing

## 2024-03-03 NOTE — Plan of Care (Signed)
  Problem: Education: Goal: Knowledge of [facility] Education information/materials will improve Outcome: Progressing   Problem: Activity: Goal: Interest or engagement in activities will improve Outcome: Progressing   Problem: Coping: Goal: Ability to demonstrate self-control will improve Outcome: Progressing   Problem: Physical Regulation: Goal: Ability to maintain clinical measurements within normal limits will improve Outcome: Progressing

## 2024-03-03 NOTE — Plan of Care (Signed)
  Problem: Education: Goal: Knowledge of [facility] Education information/materials will improve Outcome: Progressing Goal: Emotional status will improve Outcome: Progressing Goal: Mental status will improve Outcome: Progressing   Problem: Activity: Goal: Interest or engagement in activities will improve Outcome: Progressing   Problem: Coping: Goal: Ability to verbalize frustrations and anger appropriately will improve Outcome: Progressing   Problem: Safety: Goal: Periods of time without injury will increase Outcome: Progressing

## 2024-03-03 NOTE — Group Note (Signed)
 Recreation Therapy Group Note   Group Topic:Relaxation  Group Date: 03/03/2024 Start Time: 1530 End Time: 1620 Facilitators: Celestia Jeoffrey FORBES ARTICE, CTRS Location: Craft Room  Group Description: Meditation. LRT and patients discussed what they know about meditation and mindfulness. LRT played a Deep Breathing Meditation exercise script for patients to follow along to. LRT and patients discussed how meditation and deep breathing can be used as a coping skill post--discharge to help manage symptoms of stress.   Goal Area(s) Addressed: Patient will practice using relaxation technique. Patient will identify a new coping skill.  Patient will follow multistep directions to reduce anxiety and stress.   Affect/Mood: Appropriate   Participation Level: Moderate   Participation Quality: Independent   Behavior: Calm and Cooperative   Speech/Thought Process: Coherent   Insight: Fair   Judgement: Fair    Modes of Intervention: Activity and Exploration   Patient Response to Interventions:  Resistant    Education Outcome:  In group clarification offered    Clinical Observations/Individualized Feedback: Willam was somewhat active in their participation of session activities and group discussion. Pt identified are you trying to heal us ? LRT shared that she is providing skills an techniques that can help them calm down and relax. Pt seemed hesitant.    Plan: Continue to engage patient in RT group sessions 2-3x/week.   Jeoffrey FORBES Celestia, LRT, CTRS 03/03/2024 5:08 PM

## 2024-03-03 NOTE — Progress Notes (Addendum)
 Pt approached nurse, requested pain medication and stated she was having anxiety in my chest and declined to have anti-anxiety medication and wanted Tylenol , given 650 mg and reported relief. Pt then stated do you think I should kill myself. Pt assessed for immediate danger and she denied having plan or intent right now. She reports that they are making her feel worthless, saying things, telling me I'm not worth living. Pt initially was apprehensive to admit A/H but than endorsed they are in my head. Pt was also apprehensive to taking her Seroquel  but later came and asked for it and retired to bed and without further concerns. Pt is been monitor on q 15 min rounds for safety and support.      03/03/24 2000  Psych Admission Type (Psych Patients Only)  Admission Status Involuntary  Psychosocial Assessment  Patient Complaints Anxiety  Eye Contact Fair  Facial Expression Animated  Affect Anxious  Speech Logical/coherent  Interaction Assertive  Motor Activity  (WDL)  Appearance/Hygiene Unremarkable  Behavior Characteristics Anxious;Restless  Mood Preoccupied;Sad  Thought Process  Coherency WDL  Content WDL  Delusions Persecutory;Paranoid  Perception WDL  Hallucination Auditory  Judgment Limited  Confusion None  Danger to Others  Danger to Others None reported or observed

## 2024-03-03 NOTE — Progress Notes (Signed)
 Pt was in bed on engagement.  She denies SI/HI, AVH, paranoia and delusional thoughts, depression and anxiety and expressed desire to be discharged.  Pt took HS medications.    03/02/24 2200  Psych Admission Type (Psych Patients Only)  Admission Status Involuntary  Psychosocial Assessment  Patient Complaints None  Eye Contact Brief  Facial Expression Flat  Affect Appropriate to circumstance  Speech Logical/coherent  Interaction Assertive  Motor Activity Slow  Appearance/Hygiene In scrubs  Behavior Characteristics Cooperative  Mood Pleasant  Aggressive Behavior  Effect No apparent injury  Thought Process  Coherency WDL  Content WDL  Delusions None reported or observed  Perception WDL  Hallucination None reported or observed  Judgment Limited  Confusion None  Danger to Self  Current suicidal ideation? Denies  Agreement Not to Harm Self Yes  Description of Agreement Verbal, RN  Danger to Others  Danger to Others None reported or observed    Problem: Education: Goal: Knowledge of [facility] Education information/materials will improve Outcome: Progressing Goal: Emotional status will improve Outcome: Progressing Goal: Mental status will improve Outcome: Progressing Goal: Verbalization of understanding the information provided will improve Outcome: Progressing   Problem: Activity: Goal: Interest or engagement in activities will improve Outcome: Progressing Goal: Sleeping patterns will improve Outcome: Progressing   Problem: Coping: Goal: Ability to verbalize frustrations and anger appropriately will improve Outcome: Progressing Goal: Ability to demonstrate self-control will improve Outcome: Progressing

## 2024-03-03 NOTE — Group Note (Signed)
 Recreation Therapy Group Note   Group Topic:Stress Management  Group Date: 03/03/2024 Start Time: 1005 End Time: 1100 Facilitators: Celestia Jeoffrey BRAVO, LRT, CTRS Location: Courtyard  Group Description: [employer]. LRT and patients played games of basketball, drew with chalk, and played corn hole while outside in the courtyard while getting fresh air and sunlight. Music was being played in the background. LRT and peers conversed about different games they have played before, what they do in their free time and anything else that is on their minds. LRT encouraged pts to drink water after being outside, sweating and getting their heart rate up.  Goal Area(s) Addressed: Patient will build on frustration tolerance skills. Patients will partake in a competitive play game with peers. Patients will gain knowledge of new leisure interest/hobby.    Affect/Mood: Appropriate   Participation Level: Active   Participation Quality: Independent   Behavior: Appropriate   Speech/Thought Process: Coherent   Insight: Good   Judgement: Good   Modes of Intervention: Activity   Patient Response to Interventions:  Receptive   Education Outcome:  Acknowledges education   Clinical Observations/Individualized Feedback: Willam was active in their participation of session activities and group discussion. Pt interacted well with LRT and peers duration of session.    Plan: Continue to engage patient in RT group sessions 2-3x/week.   Jeoffrey BRAVO Celestia, LRT, CTRS 03/03/2024 11:40 AM

## 2024-03-04 DIAGNOSIS — F315 Bipolar disorder, current episode depressed, severe, with psychotic features: Secondary | ICD-10-CM | POA: Diagnosis not present

## 2024-03-04 MED ORDER — QUETIAPINE FUMARATE 200 MG PO TABS
200.0000 mg | ORAL_TABLET | Freq: Every day | ORAL | Status: DC
  Administered 2024-03-04 – 2024-03-07 (×4): 200 mg via ORAL
  Filled 2024-03-04 (×4): qty 1

## 2024-03-04 NOTE — Plan of Care (Signed)

## 2024-03-04 NOTE — Progress Notes (Signed)
   03/04/24 2100  Psych Admission Type (Psych Patients Only)  Admission Status Involuntary  Psychosocial Assessment  Patient Complaints Anxiety  Eye Contact Fair  Facial Expression Animated  Affect Anxious  Speech Soft  Interaction Assertive;Childlike;Flirtatious;Guarded;Hypervigilant (asked RN not to document sexual ludeness)  Mood Preoccupied  Aggressive Behavior  Effect No apparent injury  Thought Process  Coherency WDL  Content WDL  Delusions Erotomanic;Paranoid  Perception Illusions (voiced wanting to provide sex for money)  Hallucination Auditory  Judgment Poor  Confusion None  Danger to Self  Current suicidal ideation? Denies  Agreement Not to Harm Self Yes  Description of Agreement verbal  Danger to Others  Danger to Others None reported or observed

## 2024-03-04 NOTE — Progress Notes (Signed)
 [facility] MD Progress Note  03/04/2024 2:57 PM Lauren Poole  MRN:  [id number]   Subjective:  Chart reviewed, case discussed in multidisciplinary meeting, patient seen during rounds.   7/19 patient seen for follow-up.  They are alert and oriented.  They continue to be guarded on exam.  Nursing notes they have made multiple comments asking if they should kill himself last night.  They are denying SI today.  They appear to be internally preoccupied at times today.  They are not observed responding to internal stimuli.  He continues to demonstrate paranoia when interacting with staff.  On initial discussion they are doing well but when you further discuss they again believe that the world is against him and state the world is not a safe place for them.  When asked to clarify they indicate they have nothing more to say to the provider.  Question about their ADLs they indicate they are performing.  Patient continues to have severely impaired judgment and there is concern for risk of self-harm.  Patient continues to require inpatient psychiatric hospitalization.  7/18: Patient seen for follow-up today.  They are alert and oriented.  They are pleasant and cooperative on exam today.  They report they have been attending groups and participating the unit milieu.  They report they are tolerating medications without adverse effect.  They report they are working on developing coping mechanisms.  They have been coloring a lot since they have been here and they have multiple pages of art work.  They indicate they are performing their ADLs.  They deny depressive symptoms.  There is some ongoing paranoia patient now believes that is best for her to go back to her mother's at discharge.  She does not appear internally preoccupied during today's exam nor she observed responding to internal stimuli.  She still believes that she has been redline and there are people against her.  She denies SI and HI.  She denies AVH.  7/17  Patient seen today for follow-up care alert and oriented x 3.  More cooperative today on exam.  He still has some paranoia and discussed that they have been redline and are unable to find employment or housing.  Feeling they are being targeted.  They are more linear on exam today and answer questions appropriately.  They are observed participating in the unit milieu.  He indicates they have been performing their ADLs specifically bathing, feeding, and oral care.  They deny depressive symptoms.  They deny thoughts of harming himself or others.  They indicate they are unclear where they would go at the time of discharge but that they do not have very many options.  They do not appear internally preoccupied nor the observed responding to internal stimuli.   Sleep: Good  Appetite:  Fair  Past Psychiatric History: see h&P Family History:  Family History  Problem Relation Age of Onset   Schizophrenia Sister    Social History:  Social History   Substance and Sexual Activity  Alcohol Use Yes   Alcohol/week: 4.0 standard drinks of alcohol   Types: 4 Glasses of wine per week     Social History   Substance and Sexual Activity  Drug Use Not Currently   Types: Marijuana    Social History   Socioeconomic History   Marital status: Single    Spouse name: Not on file   Number of children: Not on file   Years of education: Not on file   Highest education level: Not  on file  Occupational History   Not on file  Tobacco Use   Smoking status: Every Day    Current packs/day: 0.50    Average packs/day: 0.5 packs/day for 6.4 years (3.2 ttl pk-yrs)    Types: Cigarettes    Start date: 10/2017   Smokeless tobacco: Never  Vaping Use   Vaping status: Never Used  Substance and Sexual Activity   Alcohol use: Yes    Alcohol/week: 4.0 standard drinks of alcohol    Types: 4 Glasses of wine per week   Drug use: Not Currently    Types: Marijuana   Sexual activity: Yes    Birth control/protection:  None  Other Topics Concern   Not on file  Social History Narrative   Not on file   Social Drivers of Health   Financial Resource Strain: Not on file  Food Insecurity: No Food Insecurity (02/29/2024)   Hunger Vital Sign    Worried About Running Out of Food in the Last Year: Never true    Ran Out of Food in the Last Year: Never true  Transportation Needs: No Transportation Needs (02/29/2024)   PRAPARE - Administrator, Civil Service (Medical): No    Lack of Transportation (Non-Medical): No  Physical Activity: Not on file  Stress: Not on file  Social Connections: Not on file   Past Medical History:  Past Medical History:  Diagnosis Date   Bipolar 1 disorder (HCC)    Chlamydia    Gonorrhea     Past Surgical History:  Procedure Laterality Date   NO PAST SURGERIES      Current Medications: Current Facility-Administered Medications  Medication Dose Route Frequency Provider Last Rate Last Admin   acetaminophen  (TYLENOL ) tablet 650 mg  650 mg Oral Q6H PRN Jadapalle, Sree, MD   650 mg at 03/03/24 2041   alum & mag hydroxide-simeth (MAALOX/MYLANTA) 200-200-20 MG/5ML suspension 30 mL  30 mL Oral Q4H PRN Donnelly Mellow, MD       cholecalciferol  (VITAMIN D3) 25 MCG (1000 UNIT) tablet 1,000 Units  1,000 Units Oral Daily Merla Sawka E, PA-C   1,000 Units at 03/04/24 0831   haloperidol  lactate (HALDOL ) injection 5 mg  5 mg Intramuscular TID PRN Jadapalle, Sree, MD       And   diphenhydrAMINE  (BENADRYL ) injection 50 mg  50 mg Intramuscular TID PRN Jadapalle, Sree, MD       And   LORazepam  (ATIVAN ) injection 2 mg  2 mg Intramuscular TID PRN Jadapalle, Sree, MD       hydrOXYzine  (ATARAX ) tablet 25 mg  25 mg Oral Q6H PRN Jadapalle, Sree, MD   25 mg at 03/04/24 1442   magnesium  hydroxide (MILK OF MAGNESIA) suspension 30 mL  30 mL Oral Daily PRN Donnelly Mellow, MD       QUEtiapine  (SEROQUEL ) tablet 200 mg  200 mg Oral QHS Heath Tesler E, PA-C       traZODone   (DESYREL ) tablet 50 mg  50 mg Oral QHS PRN Jadapalle, Sree, MD   50 mg at 02/29/24 2116    Lab Results: No results found for this or any previous visit (from the past 48 hours).  Blood Alcohol level:  Lab Results  Component Value Date   [facility] <15 02/28/2024   ETH <10 11/21/2023    Metabolic Disorder Labs: Lab Results  Component Value Date   HGBA1C 5.1 10/31/2023   MPG 99.67 10/31/2023   No results found for: PROLACTIN Lab Results  Component  Value Date   CHOL 136 10/31/2023   TRIG 75 10/31/2023   HDL 37 (L) 10/31/2023   CHOLHDL 3.7 10/31/2023   VLDL 15 10/31/2023   LDLCALC 84 10/31/2023    Physical Findings: AIMS:  , ,  ,  ,    CIWA:    COWS:      Psychiatric Specialty Exam:  Presentation  General Appearance:  Casual  Eye Contact: Minimal  Speech: Normal Rate  Speech Volume: Normal    Mood and Affect  Mood: Irritable  Affect: Congruent   Thought Process  Thought Processes: Other (comment) (concrete)  Descriptions of Associations:Circumstantial  Orientation:Full (Time, Place and Person)  Thought Content:Perseveration; Delusions; Paranoid Ideation  Hallucinations:No data recorded  Ideas of Reference:Delusions; Paranoia; Percusatory  Suicidal Thoughts:No data recorded  Homicidal Thoughts:No data recorded   Sensorium  Memory: Immediate Good  Judgment: Impaired  Insight: Poor   Executive Functions  Concentration: Good  Attention Span: Good  Recall: Good  Fund of Knowledge: Good  Language: Good   Psychomotor Activity  Psychomotor Activity:No data recorded Musculoskeletal: Strength & Muscle Tone: within normal limits Gait & Station: normal Assets  Assets: Manufacturing systems engineer; Social Support; Housing; Leisure Time    Physical Exam: Physical Exam Vitals and nursing note reviewed.  HENT:     Head: Atraumatic.  Eyes:     Extraocular Movements: Extraocular movements intact.  Pulmonary:     Effort:  Pulmonary effort is normal.  Neurological:     Mental Status: She is alert and oriented to person, place, and time.    Review of Systems  Psychiatric/Behavioral:  Negative for hallucinations and suicidal ideas. The patient is nervous/anxious. The patient does not have insomnia.    Blood pressure 112/82, pulse 71, temperature (!) 97.3 F (36.3 C), resp. rate 20, height 5' 4 (1.626 m), weight 108.9 kg, SpO2 (!) 73%. Body mass index is 41.21 kg/m.  Diagnosis: Active Problems:   Severe bipolar I disorder, current or most recent episode depressed, with psychotic features (HCC)   PLAN: Safety and Monitoring:  -- Voluntary admission to inpatient psychiatric unit for safety, stabilization and treatment  -- Daily contact with patient to assess and evaluate symptoms and progress in treatment  -- Patient's case to be discussed in multi-disciplinary team meeting  -- Observation Level : q15 minute checks  -- Vital signs:  q12 hours  -- Precautions: suicide, elopement, and assault -- Encouraged patient to participate in unit milieu and in scheduled group therapies  2. Psychiatric Diagnoses and Treatment:     Will increase Seroquel , and consider addition of Lamictal or Lithium.           Bipolar 1 disorder mixed episode Increase Seroquel  to 200 mg nightly   -- The risks/benefits/side-effects/alternatives to this medication were discussed in detail with the patient and time was given for questions. The patient consents to medication trial.                -- Metabolic profile and EKG monitoring obtained while on an atypical antipsychotic (BMI: Lipid Panel: HbgA1c: QTc:)              -- Encouraged patient to participate in unit milieu and in scheduled group therapies                            3. Medical Issues Being Addressed:    No acute concerns noted Restarted home vitamin D   4. Discharge Planning:   --  Social work and case management to assist with discharge planning and identification  of hospital follow-up needs prior to discharge  -- Estimated LOS: 3-4 days  Donnice FORBES Right, PA-C 03/04/2024, 2:57 PM

## 2024-03-04 NOTE — Group Note (Signed)
 Date:  03/04/2024 Time:  11:37 AM  Group Topic/Focus:  Goals Group:   The focus of this group is to help patients establish daily goals to achieve during treatment and discuss how the patient can incorporate goal setting into their daily lives to aide in recovery.    Participation Level:  Did Not Attend   Deitra Clap [facility] 03/04/2024, 11:37 AM

## 2024-03-04 NOTE — Group Note (Signed)
 Date:  03/04/2024 Time:  10:18 PM  Group Topic/Focus:  Building Self Esteem:   The Focus of this group is helping patients become aware of the effects of self-esteem on their lives, the things they and others do that enhance or undermine their self-esteem, seeing the relationship between their level of self-esteem and the choices they make and learning ways to enhance self-esteem. Managing Feelings:   The focus of this group is to identify what feelings patients have difficulty handling and develop a plan to handle them in a healthier way upon discharge. Self Care:   The focus of this group is to help patients understand the importance of self-care in order to improve or restore emotional, physical, spiritual, interpersonal, and financial health.    Participation Level:  Minimal  Participation Quality:  Inattentive  Affect:  Appropriate  Cognitive:  Disorganized and Confused  Insight: Limited  Engagement in Group:  Distracting and Limited  Modes of Intervention:  Discussion and Support  Additional Comments:  N/A  Lauren Poole 03/04/2024, 10:18 PM

## 2024-03-04 NOTE — Progress Notes (Signed)
   03/04/24 0829  Psych Admission Type (Psych Patients Only)  Admission Status Involuntary  Psychosocial Assessment  Patient Complaints Anxiety (hydroxizine 25mg  given po per pt request)  Eye Contact Fair  Facial Expression Animated  Affect Anxious  Speech Soft  Interaction Assertive;Childlike  Motor Activity Slow  Appearance/Hygiene Unremarkable  Behavior Characteristics Cooperative;Anxious  Mood Preoccupied;Sad  Aggressive Behavior  Effect No apparent injury  Thought Process  Coherency WDL  Content WDL  Delusions Persecutory;Paranoid  Perception WDL  Hallucination Auditory  Judgment Limited  Confusion None  Danger to Self  Agreement Not to Harm Self Yes  Description of Agreement verbal  Danger to Others  Danger to Others None reported or observed

## 2024-03-05 DIAGNOSIS — F315 Bipolar disorder, current episode depressed, severe, with psychotic features: Secondary | ICD-10-CM | POA: Diagnosis not present

## 2024-03-05 NOTE — Progress Notes (Signed)
 [facility] MD Progress Note  03/05/2024 5:08 PM Liyana Suniga  MRN:  [id number]   Subjective:  Chart reviewed, case discussed in multidisciplinary meeting, patient seen during rounds.   7/20: Patient seen for follow-up they are alert and oriented.  They are more cooperative with interview today and are looking out the window.  They note some ongoing paranoia reporting they believe their stepparents are trying to kill them.  They do not appear internally preoccupied today.  They are not observed responding to internal stimuli.  They deny depressive symptoms.  Encouraged them to reach out to their family to discuss if they will be able to return home at discharge.  They indicate they are performing ADLs.  Discussed coping mechanisms and encouraged patient to participate in group.  They deny SI, HI, and AVH today.  They deny adverse effects of medications.  They voiced no concerns or complaints today.  They report improved sleep and appetite.Discussed mood stabilizer given pt's mood lability and they were not agreeable.   7/19 patient seen for follow-up.  They are alert and oriented.  They continue to be guarded on exam.  Nursing notes they have made multiple comments asking if they should kill himself last night.  They are denying SI today.  They appear to be internally preoccupied at times today.  They are not observed responding to internal stimuli.  He continues to demonstrate paranoia when interacting with staff.  On initial discussion they are doing well but when you further discuss they again believe that the world is against him and state the world is not a safe place for them.  When asked to clarify they indicate they have nothing more to say to the provider.  Question about their ADLs they indicate they are performing.  Patient continues to have severely impaired judgment and there is concern for risk of self-harm.  Patient continues to require inpatient psychiatric hospitalization.  7/18: Patient  seen for follow-up today.  They are alert and oriented.  They are pleasant and cooperative on exam today.  They report they have been attending groups and participating the unit milieu.  They report they are tolerating medications without adverse effect.  They report they are working on developing coping mechanisms.  They have been coloring a lot since they have been here and they have multiple pages of art work.  They indicate they are performing their ADLs.  They deny depressive symptoms.  There is some ongoing paranoia patient now believes that is best for her to go back to her mother's at discharge.  She does not appear internally preoccupied during today's exam nor she observed responding to internal stimuli.  She still believes that she has been redline and there are people against her.  She denies SI and HI.  She denies AVH.  7/17 Patient seen today for follow-up care alert and oriented x 3.  More cooperative today on exam.  He still has some paranoia and discussed that they have been redline and are unable to find employment or housing.  Feeling they are being targeted.  They are more linear on exam today and answer questions appropriately.  They are observed participating in the unit milieu.  He indicates they have been performing their ADLs specifically bathing, feeding, and oral care.  They deny depressive symptoms.  They deny thoughts of harming himself or others.  They indicate they are unclear where they would go at the time of discharge but that they do not have very many  options.  They do not appear internally preoccupied nor the observed responding to internal stimuli.   Sleep: Good  Appetite:  Fair  Past Psychiatric History: see h&P Family History:  Family History  Problem Relation Age of Onset   Schizophrenia Sister    Social History:  Social History   Substance and Sexual Activity  Alcohol Use Yes   Alcohol/week: 4.0 standard drinks of alcohol   Types: 4 Glasses of wine per  week     Social History   Substance and Sexual Activity  Drug Use Not Currently   Types: Marijuana    Social History   Socioeconomic History   Marital status: Single    Spouse name: Not on file   Number of children: Not on file   Years of education: Not on file   Highest education level: Not on file  Occupational History   Not on file  Tobacco Use   Smoking status: Every Day    Current packs/day: 0.50    Average packs/day: 0.5 packs/day for 6.4 years (3.2 ttl pk-yrs)    Types: Cigarettes    Start date: 10/2017   Smokeless tobacco: Never  Vaping Use   Vaping status: Never Used  Substance and Sexual Activity   Alcohol use: Yes    Alcohol/week: 4.0 standard drinks of alcohol    Types: 4 Glasses of wine per week   Drug use: Not Currently    Types: Marijuana   Sexual activity: Yes    Birth control/protection: None  Other Topics Concern   Not on file  Social History Narrative   Not on file   Social Drivers of Health   Financial Resource Strain: Not on file  Food Insecurity: No Food Insecurity (02/29/2024)   Hunger Vital Sign    Worried About Running Out of Food in the Last Year: Never true    Ran Out of Food in the Last Year: Never true  Transportation Needs: No Transportation Needs (02/29/2024)   PRAPARE - Administrator, Civil Service (Medical): No    Lack of Transportation (Non-Medical): No  Physical Activity: Not on file  Stress: Not on file  Social Connections: Not on file   Past Medical History:  Past Medical History:  Diagnosis Date   Bipolar 1 disorder (HCC)    Chlamydia    Gonorrhea     Past Surgical History:  Procedure Laterality Date   NO PAST SURGERIES      Current Medications: Current Facility-Administered Medications  Medication Dose Route Frequency Provider Last Rate Last Admin   acetaminophen  (TYLENOL ) tablet 650 mg  650 mg Oral Q6H PRN Jadapalle, Sree, MD   650 mg at 03/03/24 2041   alum & mag hydroxide-simeth (MAALOX/MYLANTA)  200-200-20 MG/5ML suspension 30 mL  30 mL Oral Q4H PRN Jadapalle, Sree, MD       cholecalciferol  (VITAMIN D3) 25 MCG (1000 UNIT) tablet 1,000 Units  1,000 Units Oral Daily Marysa Wessner E, PA-C   1,000 Units at 03/05/24 9160   haloperidol  lactate (HALDOL ) injection 5 mg  5 mg Intramuscular TID PRN Jadapalle, Sree, MD       And   diphenhydrAMINE  (BENADRYL ) injection 50 mg  50 mg Intramuscular TID PRN Jadapalle, Sree, MD       And   LORazepam  (ATIVAN ) injection 2 mg  2 mg Intramuscular TID PRN Jadapalle, Sree, MD       hydrOXYzine  (ATARAX ) tablet 25 mg  25 mg Oral Q6H PRN Donnelly Mellow, MD  25 mg at 03/05/24 0840   magnesium  hydroxide (MILK OF MAGNESIA) suspension 30 mL  30 mL Oral Daily PRN Donnelly Mellow, MD       QUEtiapine  (SEROQUEL ) tablet 200 mg  200 mg Oral QHS Kani Chauvin E, PA-C   200 mg at 03/04/24 1938   traZODone  (DESYREL ) tablet 50 mg  50 mg Oral QHS PRN Jadapalle, Sree, MD   50 mg at 02/29/24 2116    Lab Results: No results found for this or any previous visit (from the past 48 hours).  Blood Alcohol level:  Lab Results  Component Value Date   [facility] <15 02/28/2024   ETH <10 11/21/2023    Metabolic Disorder Labs: Lab Results  Component Value Date   HGBA1C 5.1 10/31/2023   MPG 99.67 10/31/2023   No results found for: PROLACTIN Lab Results  Component Value Date   CHOL 136 10/31/2023   TRIG 75 10/31/2023   HDL 37 (L) 10/31/2023   CHOLHDL 3.7 10/31/2023   VLDL 15 10/31/2023   LDLCALC 84 10/31/2023    Physical Findings: AIMS:  , ,  ,  ,    CIWA:    COWS:      Psychiatric Specialty Exam:  Presentation  General Appearance:  Casual  Eye Contact: Minimal  Speech: Normal Rate  Speech Volume: Normal    Mood and Affect  Mood: Irritable  Affect: Congruent   Thought Process  Thought Processes: Other (comment) (concrete)  Descriptions of Associations:Circumstantial  Orientation:Full (Time, Place and Person)  Thought  Content:Perseveration; Delusions; Paranoid Ideation  Hallucinations:No data recorded  Ideas of Reference:Delusions; Paranoia; Percusatory  Suicidal Thoughts:No data recorded  Homicidal Thoughts:No data recorded   Sensorium  Memory: Immediate Good  Judgment: Impaired  Insight: Poor   Executive Functions  Concentration: Good  Attention Span: Good  Recall: Good  Fund of Knowledge: Good  Language: Good   Psychomotor Activity  Psychomotor Activity:No data recorded Musculoskeletal: Strength & Muscle Tone: within normal limits Gait & Station: normal Assets  Assets: Manufacturing systems engineer; Social Support; Housing; Leisure Time    Physical Exam: Physical Exam Vitals and nursing note reviewed.  HENT:     Head: Atraumatic.  Eyes:     Extraocular Movements: Extraocular movements intact.  Pulmonary:     Effort: Pulmonary effort is normal.  Neurological:     Mental Status: She is alert and oriented to person, place, and time.    Review of Systems  Psychiatric/Behavioral:  Negative for hallucinations and suicidal ideas. The patient is nervous/anxious. The patient does not have insomnia.    Blood pressure 128/85, pulse 70, temperature 97.7 F (36.5 C), resp. rate 16, height 5' 4 (1.626 m), weight 108.9 kg, SpO2 98%. Body mass index is 41.21 kg/m.  Diagnosis: Active Problems:   Severe bipolar I disorder, current or most recent episode depressed, with psychotic features (HCC)   PLAN: Safety and Monitoring:  -- Voluntary admission to inpatient psychiatric unit for safety, stabilization and treatment  -- Daily contact with patient to assess and evaluate symptoms and progress in treatment  -- Patient's case to be discussed in multi-disciplinary team meeting  -- Observation Level : q15 minute checks  -- Vital signs:  q12 hours  -- Precautions: suicide, elopement, and assault -- Encouraged patient to participate in unit milieu and in scheduled group therapies   2. Psychiatric Diagnoses and Treatment:     Pt not agreeable with addition of mood stabilizer.  Bipolar 1 disorder mixed episode Continue Seroquel  to 200 mg nightly   -- The risks/benefits/side-effects/alternatives to this medication were discussed in detail with the patient and time was given for questions. The patient consents to medication trial.                -- Metabolic profile and EKG monitoring obtained while on an atypical antipsychotic (BMI: Lipid Panel: HbgA1c: QTc:)              -- Encouraged patient to participate in unit milieu and in scheduled group therapies                            3. Medical Issues Being Addressed:    No acute concerns noted Restarted home vitamin D   4. Discharge Planning:   -- Social work and case management to assist with discharge planning and identification of hospital follow-up needs prior to discharge  -- Estimated LOS: 3-4 days  Donnice FORBES Right, PA-C 03/05/2024, 5:08 PM

## 2024-03-05 NOTE — Progress Notes (Signed)
   03/05/24 0900  Psych Admission Type (Psych Patients Only)  Admission Status Involuntary  Psychosocial Assessment  Patient Complaints Anxiety;Depression (patient states I have to figure out what I'm going to do after I leave here, which is why she's feeling this way.)  Eye Contact Fair  Facial Expression Anxious;Worried  Affect Preoccupied  Systems analyst;Soft  Interaction Assertive  Motor Activity Slow  Appearance/Hygiene In scrubs  Behavior Characteristics Cooperative;Appropriate to situation  Mood Preoccupied;Pleasant  Aggressive Behavior  Effect No apparent injury  Thought Process  Coherency WDL  Content WDL  Delusions None reported or observed  Perception WDL  Hallucination None reported or observed  Judgment WDL  Confusion None  Danger to Self  Current suicidal ideation? Denies  Agreement Not to Harm Self Yes  Description of Agreement Verbal  Danger to Others  Danger to Others None reported or observed   Patient had no stated goals for today. However, she was focused on when she'll discharge and how she'll get home.

## 2024-03-05 NOTE — Group Note (Signed)
 Date:  03/05/2024 Time:  7:33 PM  Group Topic/Focus:  Activity Group: The focus of the group is to promote activity for the patients and encourage them to go outside and get some fresh air and some exercise.    Participation Level:  Active  Participation Quality:  Appropriate  Affect:  Appropriate  Cognitive:  Appropriate  Insight: Appropriate  Engagement in Group:  Engaged  Modes of Intervention:  Activity  Additional Comments:    Lauren Poole 03/05/2024, 7:33 PM

## 2024-03-05 NOTE — Group Note (Signed)
 Date:  03/05/2024 Time:  11:07 AM  Group Topic/Focus:  Self Esteem Action Plan:   The focus of this group is to help patients create a plan to continue to build self-esteem after discharge.    Participation Level:  Active  Participation Quality:  Appropriate  Affect:  Appropriate  Cognitive:  Appropriate  Insight: Appropriate  Engagement in Group:  Engaged  Modes of Intervention:  Activity  Additional Comments:    Camellia [name] Indy Prestwood 03/05/2024, 11:07 AM

## 2024-03-05 NOTE — Group Note (Signed)
 Date:  03/05/2024 Time:  11:41 PM  Group Topic/Focus:  Building Self Esteem:   The Focus of this group is helping patients become aware of the effects of self-esteem on their lives, the things they and others do that enhance or undermine their self-esteem, seeing the relationship between their level of self-esteem and the choices they make and learning ways to enhance self-esteem. Goals Group:   The focus of this group is to help patients establish daily goals to achieve during treatment and discuss how the patient can incorporate goal setting into their daily lives to aide in recovery. Self Care:   The focus of this group is to help patients understand the importance of self-care in order to improve or restore emotional, physical, spiritual, interpersonal, and financial health.    Participation Level:  Minimal  Participation Quality:  Appropriate  Affect:  Appropriate  Cognitive:  Appropriate and Oriented  Insight: Appropriate  Engagement in Group:  Limited  Modes of Intervention:  Discussion and Support  Additional Comments:  N/A  Butler LITTIE Gelineau 03/05/2024, 11:41 PM

## 2024-03-05 NOTE — Plan of Care (Signed)

## 2024-03-06 NOTE — Group Note (Signed)
 [facility] LCSW Group Therapy Note    Group Date: 03/06/2024 Start Time: 1300 End Time: 1400  Type of Therapy and Topic:  Group Therapy:  Overcoming Obstacles  Participation Level:  [facility] PARTICIPATION LEVEL: Minimal  Mood:  Description of Group:   In this group patients will be encouraged to explore what they see as obstacles to their own wellness and recovery. They will be guided to discuss their thoughts, feelings, and behaviors related to these obstacles. The group will process together ways to cope with barriers, with attention given to specific choices patients can make. Each patient will be challenged to identify changes they are motivated to make in order to overcome their obstacles. This group will be process-oriented, with patients participating in exploration of their own experiences as well as giving and receiving support and challenge from other group members.  Therapeutic Goals: 1. Patient will identify personal and current obstacles as they relate to admission. 2. Patient will identify barriers that currently interfere with their wellness or overcoming obstacles.  3. Patient will identify feelings, thought process and behaviors related to these barriers. 4. Patient will identify two changes they are willing to make to overcome these obstacles:    Summary of Patient Progress Patient attended group.  Patient sat with her head and body turned toward the corner with no eyue contact.  Patient remained on topic and did engage.  Patient displayed fair insight.   Therapeutic Modalities:   Cognitive Behavioral Therapy Solution Focused Therapy Motivational Interviewing Relapse Prevention Therapy   Sherryle JINNY Margo, LCSW

## 2024-03-06 NOTE — Progress Notes (Signed)
   03/05/24 2100  Psych Admission Type (Psych Patients Only)  Admission Status Involuntary  Psychosocial Assessment  Patient Complaints Anxiety  Eye Contact Fair  Facial Expression Worried  Affect Preoccupied  Speech Logical/coherent  Interaction Assertive;Cautious  Motor Activity Slow  Appearance/Hygiene Improved  Behavior Characteristics Cooperative;Appropriate to situation;Calm  Mood Preoccupied;Pleasant  Thought Process  Coherency WDL  Content WDL  Delusions None reported or observed  Perception WDL  Hallucination None reported or observed  Judgment WDL  Confusion None  Danger to Self  Current suicidal ideation? Denies  Agreement Not to Harm Self Yes  Description of Agreement verbal  Danger to Others  Danger to Others None reported or observed   Patient is alert and oriented x 4, affect is blunted thoughts are organized , she denies SI/HI/AVH, affect is congruent with mood.

## 2024-03-06 NOTE — Progress Notes (Signed)
   03/06/24 2200  Psych Admission Type (Psych Patients Only)  Admission Status Involuntary  Psychosocial Assessment  Patient Complaints None  Eye Contact Fair  Facial Expression Animated  Affect Preoccupied  Speech Logical/coherent  Interaction Assertive  Motor Activity Slow  Appearance/Hygiene Improved  Behavior Characteristics Cooperative;Appropriate to situation  Mood Pleasant  Aggressive Behavior  Effect No apparent injury  Thought Process  Coherency WDL  Content WDL  Delusions None reported or observed  Perception WDL  Hallucination None reported or observed  Judgment WDL  Confusion None  Danger to Self  Current suicidal ideation? Denies  Agreement Not to Harm Self Yes  Description of Agreement verbal  Danger to Others  Danger to Others None reported or observed

## 2024-03-06 NOTE — Plan of Care (Signed)
   Problem: Education: Goal: Emotional status will improve Outcome: Progressing Goal: Mental status will improve Outcome: Progressing Goal: Verbalization of understanding the information provided will improve Outcome: Progressing

## 2024-03-06 NOTE — Progress Notes (Signed)
   03/06/24 0800  Psych Admission Type (Psych Patients Only)  Admission Status Involuntary  Psychosocial Assessment  Patient Complaints Anxiety  Eye Contact Fair  Facial Expression Worried  Affect Preoccupied  Speech Logical/coherent  Interaction Assertive  Motor Activity Slow  Appearance/Hygiene Improved  Behavior Characteristics Cooperative  Mood Pleasant  Thought Process  Coherency WDL  Content WDL  Delusions None reported or observed  Perception WDL  Hallucination None reported or observed  Judgment WDL  Confusion None  Danger to Self  Current suicidal ideation? Denies  Agreement Not to Harm Self Yes  Description of Agreement verbal  Danger to Others  Danger to Others None reported or observed

## 2024-03-06 NOTE — Group Note (Signed)
 Recreation Therapy Group Note   Group Topic:Goal Setting  Group Date: 03/06/2024 Start Time: 1400 End Time: 1500 Facilitators: Celestia Jeoffrey BRAVO, LRT, CTRS Location: Craft Room  Group Description: Product/process development scientist. Patients were given many different magazines, a glue stick, markers, and a piece of cardstock paper. LRT and pts discussed the importance of having goals in life. LRT and pts discussed the difference between short-term and long-term goals, as well as what a SMART goal is. LRT encouraged pts to create a vision board, with images they picked and then cut out with safety scissors from the magazine, for themselves, that capture their short and long-term goals. LRT encouraged pts to show and explain their vision board to the group.   Goal Area(s) Addressed:  Patient will gain knowledge of short vs. long term goals.  Patient will identify goals for themselves. Patient will practice setting SMART goals.   Affect/Mood: N/A   Participation Level: Did not attend    Clinical Observations/Individualized Feedback: Patient did not attend group.   Plan: Continue to engage patient in RT group sessions 2-3x/week.   [address] LRT, CTRS 03/06/2024 5:02 PM

## 2024-03-06 NOTE — Progress Notes (Signed)
 [facility] MD Progress Note  03/06/2024 1:58 PM Lauren Poole  MRN:  [id number]   Subjective:  Chart reviewed, case discussed in multidisciplinary meeting, patient seen during rounds.   7/21: Patient seen today for follow-up psychiatric evaluation. She is cooperative during the encounter but presents as somewhat bizarre and guarded. Initially reports paranoid thoughts, but upon further discussion denies true psychosis, instead describing the experience as "overwhelming thoughts." She denies current suicidal ideation (SI), homicidal ideation (HI), auditory or visual hallucinations (AVH), depression, or anxiety. Patient states she is simply tired and came to this setting "so I wouldn't kill anyone," but reports she has gained a new perspective while inpatient. She expresses frustration with the circumstances of her admission, stating "I was dragged into a whirlpool of everything," and places blame on others. Discussed option to increase Seroquel  to target remaining paranoia and mood lability. She declines increase reporting her dose is fine rational shared with continued refusal for increase, will follow up tomorrow. No overt psychotic thoughts today. Mood depressed with flat guarded affect.    7/20: Patient seen for follow-up they are alert and oriented.  They are more cooperative with interview today and are looking out the window.  They note some ongoing paranoia reporting they believe their stepparents are trying to kill them.  They do not appear internally preoccupied today.  They are not observed responding to internal stimuli.  They deny depressive symptoms.  Encouraged them to reach out to their family to discuss if they will be able to return home at discharge.  They indicate they are performing ADLs.  Discussed coping mechanisms and encouraged patient to participate in group.  They deny SI, HI, and AVH today.  They deny adverse effects of medications.  They voiced no concerns or complaints today.   They report improved sleep and appetite.Discussed mood stabilizer given pt's mood lability and they were not agreeable.   7/19 patient seen for follow-up.  They are alert and oriented.  They continue to be guarded on exam.  Nursing notes they have made multiple comments asking if they should kill himself last night.  They are denying SI today.  They appear to be internally preoccupied at times today.  They are not observed responding to internal stimuli.  He continues to demonstrate paranoia when interacting with staff.  On initial discussion they are doing well but when you further discuss they again believe that the world is against him and state the world is not a safe place for them.  When asked to clarify they indicate they have nothing more to say to the provider.  Question about their ADLs they indicate they are performing.  Patient continues to have severely impaired judgment and there is concern for risk of self-harm.  Patient continues to require inpatient psychiatric hospitalization.  7/18: Patient seen for follow-up today.  They are alert and oriented.  They are pleasant and cooperative on exam today.  They report they have been attending groups and participating the unit milieu.  They report they are tolerating medications without adverse effect.  They report they are working on developing coping mechanisms.  They have been coloring a lot since they have been here and they have multiple pages of art work.  They indicate they are performing their ADLs.  They deny depressive symptoms.  There is some ongoing paranoia patient now believes that is best for her to go back to her mother's at discharge.  She does not appear internally preoccupied during today's exam nor  she observed responding to internal stimuli.  She still believes that she has been redline and there are people against her.  She denies SI and HI.  She denies AVH.  7/17 Patient seen today for follow-up care alert and oriented x 3.  More  cooperative today on exam.  He still has some paranoia and discussed that they have been redline and are unable to find employment or housing.  Feeling they are being targeted.  They are more linear on exam today and answer questions appropriately.  They are observed participating in the unit milieu.  He indicates they have been performing their ADLs specifically bathing, feeding, and oral care.  They deny depressive symptoms.  They deny thoughts of harming himself or others.  They indicate they are unclear where they would go at the time of discharge but that they do not have very many options.  They do not appear internally preoccupied nor the observed responding to internal stimuli.   Sleep: Good  Appetite:  Fair  Past Psychiatric History: see h&P Family History:  Family History  Problem Relation Age of Onset   Schizophrenia Sister    Social History:  Social History   Substance and Sexual Activity  Alcohol Use Yes   Alcohol/week: 4.0 standard drinks of alcohol   Types: 4 Glasses of wine per week     Social History   Substance and Sexual Activity  Drug Use Not Currently   Types: Marijuana    Social History   Socioeconomic History   Marital status: Single    Spouse name: Not on file   Number of children: Not on file   Years of education: Not on file   Highest education level: Not on file  Occupational History   Not on file  Tobacco Use   Smoking status: Every Day    Current packs/day: 0.50    Average packs/day: 0.5 packs/day for 6.4 years (3.2 ttl pk-yrs)    Types: Cigarettes    Start date: 10/2017   Smokeless tobacco: Never  Vaping Use   Vaping status: Never Used  Substance and Sexual Activity   Alcohol use: Yes    Alcohol/week: 4.0 standard drinks of alcohol    Types: 4 Glasses of wine per week   Drug use: Not Currently    Types: Marijuana   Sexual activity: Yes    Birth control/protection: None  Other Topics Concern   Not on file  Social History Narrative    Not on file   Social Drivers of Health   Financial Resource Strain: Not on file  Food Insecurity: No Food Insecurity (02/29/2024)   Hunger Vital Sign    Worried About Running Out of Food in the Last Year: Never true    Ran Out of Food in the Last Year: Never true  Transportation Needs: No Transportation Needs (02/29/2024)   PRAPARE - Administrator, Civil Service (Medical): No    Lack of Transportation (Non-Medical): No  Physical Activity: Not on file  Stress: Not on file  Social Connections: Not on file   Past Medical History:  Past Medical History:  Diagnosis Date   Bipolar 1 disorder (HCC)    Chlamydia    Gonorrhea     Past Surgical History:  Procedure Laterality Date   NO PAST SURGERIES      Current Medications: Current Facility-Administered Medications  Medication Dose Route Frequency Provider Last Rate Last Admin   acetaminophen  (TYLENOL ) tablet 650 mg  650 mg Oral  Q6H PRN Jadapalle, Sree, MD   650 mg at 03/03/24 2041   alum & mag hydroxide-simeth (MAALOX/MYLANTA) 200-200-20 MG/5ML suspension 30 mL  30 mL Oral Q4H PRN Jadapalle, Sree, MD       cholecalciferol  (VITAMIN D3) 25 MCG (1000 UNIT) tablet 1,000 Units  1,000 Units Oral Daily Millington, Matthew E, PA-C   1,000 Units at 03/06/24 9167   haloperidol  lactate (HALDOL ) injection 5 mg  5 mg Intramuscular TID PRN Jadapalle, Sree, MD       And   diphenhydrAMINE  (BENADRYL ) injection 50 mg  50 mg Intramuscular TID PRN Jadapalle, Sree, MD       And   LORazepam  (ATIVAN ) injection 2 mg  2 mg Intramuscular TID PRN Jadapalle, Sree, MD       hydrOXYzine  (ATARAX ) tablet 25 mg  25 mg Oral Q6H PRN Jadapalle, Sree, MD   25 mg at 03/05/24 0840   magnesium  hydroxide (MILK OF MAGNESIA) suspension 30 mL  30 mL Oral Daily PRN Donnelly Mellow, MD       QUEtiapine  (SEROQUEL ) tablet 200 mg  200 mg Oral QHS Millington, Matthew E, PA-C   200 mg at 03/05/24 2100   traZODone  (DESYREL ) tablet 50 mg  50 mg Oral QHS PRN Jadapalle,  Sree, MD   50 mg at 03/05/24 2100    Lab Results: No results found for this or any previous visit (from the past 48 hours).  Blood Alcohol level:  Lab Results  Component Value Date   [facility] <15 02/28/2024   ETH <10 11/21/2023    Metabolic Disorder Labs: Lab Results  Component Value Date   HGBA1C 5.1 10/31/2023   MPG 99.67 10/31/2023   No results found for: PROLACTIN Lab Results  Component Value Date   CHOL 136 10/31/2023   TRIG 75 10/31/2023   HDL 37 (L) 10/31/2023   CHOLHDL 3.7 10/31/2023   VLDL 15 10/31/2023   LDLCALC 84 10/31/2023    Physical Findings: AIMS:  , ,  ,  ,    CIWA:    COWS:      Psychiatric Specialty Exam:  Presentation  General Appearance:  Casual  Eye Contact: Minimal  Speech: Normal Rate  Speech Volume: Normal    Mood and Affect  Mood: Irritable  Affect: Congruent   Thought Process  Thought Processes: Other (comment) (concrete)  Descriptions of Associations:Circumstantial  Orientation:Full (Time, Place and Person)  Thought Content:Perseveration; Delusions; Paranoid Ideation  Hallucinations:No data recorded  Ideas of Reference:Delusions; Paranoia; Percusatory  Suicidal Thoughts:No data recorded  Homicidal Thoughts:No data recorded   Sensorium  Memory: Immediate Good  Judgment: Impaired  Insight: Poor   Executive Functions  Concentration: Good  Attention Span: Good  Recall: Good  Fund of Knowledge: Good  Language: Good   Psychomotor Activity  Psychomotor Activity:No data recorded Musculoskeletal: Strength & Muscle Tone: within normal limits Gait & Station: normal Assets  Assets: Manufacturing systems engineer; Social Support; Housing; Leisure Time    Physical Exam: Physical Exam Vitals and nursing note reviewed.  HENT:     Head: Atraumatic.  Eyes:     Extraocular Movements: Extraocular movements intact.  Pulmonary:     Effort: Pulmonary effort is normal.  Neurological:     Mental  Status: She is alert and oriented to person, place, and time.    Review of Systems  Psychiatric/Behavioral:  Negative for hallucinations and suicidal ideas. The patient is nervous/anxious. The patient does not have insomnia.    Blood pressure 108/81, pulse 65, temperature (!) 97.3  F (36.3 C), resp. rate 16, height 5' 4 (1.626 m), weight 108.9 kg, SpO2 100%. Body mass index is 41.21 kg/m.  Diagnosis: Active Problems:   Severe bipolar I disorder, current or most recent episode depressed, with psychotic features (HCC)   PLAN: Safety and Monitoring:  -- Voluntary admission to inpatient psychiatric unit for safety, stabilization and treatment  -- Daily contact with patient to assess and evaluate symptoms and progress in treatment  -- Patient's case to be discussed in multi-disciplinary team meeting  -- Observation Level : q15 minute checks  -- Vital signs:  q12 hours  -- Precautions: suicide, elopement, and assault -- Encouraged patient to participate in unit milieu and in scheduled group therapies  2. Psychiatric Diagnoses and Treatment:     Pt not agreeable with addition of mood stabilizer.            Bipolar 1 disorder mixed episode Continue Seroquel  to 200 mg nightly   -- The risks/benefits/side-effects/alternatives to this medication were discussed in detail with the patient and time was given for questions. The patient consents to medication trial.                -- Metabolic profile and EKG monitoring obtained while on an atypical antipsychotic (BMI: Lipid Panel: HbgA1c: QTc:)              -- Encouraged patient to participate in unit milieu and in scheduled group therapies                            3. Medical Issues Being Addressed:    No acute concerns noted Restarted home vitamin D   4. Discharge Planning:   -- Social work and case management to assist with discharge planning and identification of hospital follow-up needs prior to discharge  -- Estimated LOS: 3-4  days  Hoy CHRISTELLA Pinal, NP 03/06/2024, 1:58 PM

## 2024-03-06 NOTE — Plan of Care (Signed)
  Problem: Education: Goal: Emotional status will improve Outcome: Progressing   Problem: Education: Goal: Knowledge of [place] [facility] Education information/materials will improve Outcome: Progressing Goal: Emotional status will improve Outcome: Progressing Goal: Mental status will improve Outcome: Progressing

## 2024-03-06 NOTE — Group Note (Signed)
 Date:  03/06/2024 Time:  11:26 AM  Group Topic/Focus:  Goals Group:   The focus of this group is to help patients establish daily goals to achieve during treatment and discuss how the patient can incorporate goal setting into their daily lives to aide in recovery.  Participation Level:  Did Not Attend   Lauren Poole A Jader Desai 03/06/2024, 11:26 AM

## 2024-03-06 NOTE — BH IP Treatment Plan (Signed)
 Interdisciplinary Treatment and Diagnostic Plan Update  03/06/2024 Time of Session: 9:00 AM Lauren Poole MRN: [id number]  Principal Diagnosis: <principal problem not specified>  Secondary Diagnoses: Active Problems:   Severe bipolar I disorder, current or most recent episode depressed, with psychotic features (HCC)   Current Medications:  Current Facility-Administered Medications  Medication Dose Route Frequency Provider Last Rate Last Admin   acetaminophen  (TYLENOL ) tablet 650 mg  650 mg Oral Q6H PRN Jadapalle, Sree, MD   650 mg at 03/03/24 2041   alum & mag hydroxide-simeth (MAALOX/MYLANTA) 200-200-20 MG/5ML suspension 30 mL  30 mL Oral Q4H PRN Jadapalle, Sree, MD       cholecalciferol  (VITAMIN D3) 25 MCG (1000 UNIT) tablet 1,000 Units  1,000 Units Oral Daily Millington, Matthew E, PA-C   1,000 Units at 03/06/24 9167   haloperidol  lactate (HALDOL ) injection 5 mg  5 mg Intramuscular TID PRN Jadapalle, Sree, MD       And   diphenhydrAMINE  (BENADRYL ) injection 50 mg  50 mg Intramuscular TID PRN Jadapalle, Sree, MD       And   LORazepam  (ATIVAN ) injection 2 mg  2 mg Intramuscular TID PRN Jadapalle, Sree, MD       hydrOXYzine  (ATARAX ) tablet 25 mg  25 mg Oral Q6H PRN Jadapalle, Sree, MD   25 mg at 03/05/24 0840   magnesium  hydroxide (MILK OF MAGNESIA) suspension 30 mL  30 mL Oral Daily PRN Donnelly Mellow, MD       QUEtiapine  (SEROQUEL ) tablet 200 mg  200 mg Oral QHS Millington, Matthew E, PA-C   200 mg at 03/05/24 2100   traZODone  (DESYREL ) tablet 50 mg  50 mg Oral QHS PRN Jadapalle, Sree, MD   50 mg at 03/05/24 2100   PTA Medications: Medications Prior to Admission  Medication Sig Dispense Refill Last Dose/Taking   Cholecalciferol  (VITAMIN D -3) 125 MCG (5000 UT) TABS Take 1 tablet by mouth daily. (Patient not taking: Reported on 02/29/2024) 30 tablet 0     Patient Stressors: Financial difficulties   Medication change or noncompliance    Patient Strengths: Motivation for  treatment/growth  Supportive family/friends   Treatment Modalities: Medication Management, Group therapy, Case management,  1 to 1 session with clinician, Psychoeducation, Recreational therapy.   Physician Treatment Plan for Primary Diagnosis: <principal problem not specified> Long Term Goal(s): Improvement in symptoms so as ready for discharge   Short Term Goals: Ability to verbalize feelings will improve Ability to disclose and discuss suicidal ideas Ability to maintain clinical measurements within normal limits will improve Compliance with prescribed medications will improve Ability to identify triggers associated with substance abuse/mental health issues will improve  Medication Management: Evaluate patient's response, side effects, and tolerance of medication regimen.  Therapeutic Interventions: 1 to 1 sessions, Unit Group sessions and Medication administration.  Evaluation of Outcomes: Progressing  Physician Treatment Plan for Secondary Diagnosis: Active Problems:   Severe bipolar I disorder, current or most recent episode depressed, with psychotic features (HCC)  Long Term Goal(s): Improvement in symptoms so as ready for discharge   Short Term Goals: Ability to verbalize feelings will improve Ability to disclose and discuss suicidal ideas Ability to maintain clinical measurements within normal limits will improve Compliance with prescribed medications will improve Ability to identify triggers associated with substance abuse/mental health issues will improve     Medication Management: Evaluate patient's response, side effects, and tolerance of medication regimen.  Therapeutic Interventions: 1 to 1 sessions, Unit Group sessions and Medication administration.  Evaluation of Outcomes:  Progressing   RN Treatment Plan for Primary Diagnosis: <principal problem not specified> Long Term Goal(s): Knowledge of disease and therapeutic regimen to maintain health will  improve  Short Term Goals: Ability to verbalize frustration and anger appropriately will improve, Ability to demonstrate self-control, Ability to participate in decision making will improve, Ability to verbalize feelings will improve, Ability to disclose and discuss suicidal ideas, and Ability to identify and develop effective coping behaviors will improve   Medication Management: RN will administer medications as ordered by provider, will assess and evaluate patient's response and provide education to patient for prescribed medication. RN will report any adverse and/or side effects to prescribing provider.  Therapeutic Interventions: 1 on 1 counseling sessions, Psychoeducation, Medication administration, Evaluate responses to treatment, Monitor vital signs and CBGs as ordered, Perform/monitor CIWA, COWS, AIMS and Fall Risk screenings as ordered, Perform wound care treatments as ordered.  Evaluation of Outcomes: Progressing   LCSW Treatment Plan for Primary Diagnosis: <principal problem not specified> Long Term Goal(s): Safe transition to appropriate next level of care at discharge, Engage patient in therapeutic group addressing interpersonal concerns.  Short Term Goals: Engage patient in aftercare planning with referrals and resources, Increase social support, Increase ability to appropriately verbalize feelings, Increase emotional regulation, Facilitate acceptance of mental health diagnosis and concerns, Facilitate patient progression through stages of change regarding substance use diagnoses and concerns, Identify triggers associated with mental health/substance abuse issues, and Increase skills for wellness and recovery   Therapeutic Interventions: Assess for all discharge needs, 1 to 1 time with Social worker, Explore available resources and support systems, Assess for adequacy in community support network, Educate family and significant other(s) on suicide prevention, Complete Psychosocial  Assessment, Interpersonal group therapy.  Evaluation of Outcomes: Progressing   Progress in Treatment: Attending groups: No. 03/06/24 Update: Yes.  Participating in groups: No. 03/06/24 Update: Yes.  Taking medication as prescribed: Yes. 03/06/24 Update: Yes.  Toleration medication: Yes. 03/06/24 Update: Yes.  Family/Significant other contact made: No, will contact:  CSW to contact once permission is granted. 03/06/24 Update: No. Patient has declined.  Patient understands diagnosis: Yes. 03/06/24 Update: Yes.  Discussing patient identified problems/goals with staff: Yes. 03/06/24 Update: Yes.  Medical problems stabilized or resolved: Yes. 03/06/24 Update: Yes.  Denies suicidal/homicidal ideation: Yes. 03/06/24 Update: Yes.  Issues/concerns per patient self-inventory: No. 03/06/24 Update: No.  Other: None 03/06/24 Update: None.   New problem(s) identified: No, Describe:  None 03/06/24 Update: No, Describe: None  New Short Term/Long Term Goal(s): detox, elimination of symptoms of psychosis, medication management for mood stabilization; elimination of SI thoughts; development of comprehensive mental wellness/sobriety plan.  03/06/24 Update: Goal to remain the same.   Patient Goals:   I want to not be so angry.  03/06/24 Update: Goal to remain the same.   Discharge Plan or Barriers:  CSW to assist with the development of appropriate discharge plan.  03/06/24 Update: CSW Goal to remain the same at this time.    Reason for Continuation of Hospitalization: Aggression Anxiety Delusions  Depression Mania Medical Issues Suicidal ideation  Estimated Length of Stay: 1-7 days. 03/06/24 Update: TBD.   Last 3 [place] Suicide Severity Risk Score: Flowsheet Row Admission (Current) from 02/29/2024 in [facility] ED from 02/28/2024 in [facility] Department at [facility] ED from 01/26/2024 in [facility] Emergency Department at [facility]  C-SSRS  RISK CATEGORY No Risk No Risk No Risk    Last [facility] 2/9 Scores:  No data to display          Scribe for Treatment Team: Alveta CHRISTELLA Kerns, KEN 03/06/2024 2:40 PM

## 2024-03-07 MED ORDER — VITAMIN D3 25 MCG PO TABS: 1000.0000 [IU] | ORAL_TABLET | Freq: Every day | ORAL | 0 refills | Status: DC

## 2024-03-07 MED ORDER — QUETIAPINE FUMARATE 200 MG PO TABS: 200.0000 mg | ORAL_TABLET | Freq: Every day | ORAL | 0 refills | Status: DC

## 2024-03-07 NOTE — Progress Notes (Signed)
   03/07/24 1115  Spiritual Encounters  Type of Visit Attempt (pt unavailable)  Care provided to: Pt not available  Conversation partners present during encounter Social worker/Care management/TOC  Reason for visit Routine spiritual support  OnCall Visit Yes   Staff suggested this patient may benefit from Spiritual Care but when the Chaplain went to visit the patient, she was sleeping.  Chaplain is noting that the attempt was made.    Rev. Rana M. Nicholaus, M.Div. Chaplain Resident  [facility]

## 2024-03-07 NOTE — Group Note (Signed)
 Date:  03/07/2024 Time:  8:42 PM  Group Topic/Focus:  Wrap-Up Group:   The focus of this group is to help patients review their daily goal of treatment and discuss progress on daily workbooks. We discussed unit rules and expectations along with staff identification such as who the techs are and who the nurses are for the night.    Participation Level:  Active  Participation Quality:  Appropriate  Affect:  Appropriate  Cognitive:  Alert  Insight: Appropriate  Engagement in Group:  Engaged  Modes of Intervention:  Discussion  Additional Comments:    Leigh VEAR Pais 03/07/2024, 8:42 PM

## 2024-03-07 NOTE — Progress Notes (Signed)
 [facility] MD Progress Note  03/07/2024 4:54 PM Lauren Poole  MRN:  [id number]   Subjective:  Chart reviewed, case discussed in multidisciplinary meeting, patient seen during rounds.   Patient seen today for follow-up psychiatric evaluation. She is cooperative during the encounter and denies suicidal ideation (SI), homicidal ideation (HI), auditory or visual hallucinations (AVH), depression, or anxiety. Patient reports that she has gained new perspective during her inpatient stay and while others have contributed to her situational stressors she is learning to protect and care for self. She voices frustration regarding the circumstances of her admission. Reports improved sleep and denies medication side effects.  Presentation remains unchanged. Patient is somewhat guarded but cooperative. Affect is flat and mood is described as depressed. Thought process is linear and goal-directed. No overt paranoid or delusional content is observed. There is no evidence of psychosis, mania, or disorganization. Behavior is calm and non-threatening, and there have been no unsafe incidents noted in this setting. Insight is limited, though improving, and judgment is fair.  No changes in plan of care today. Seroquel  has been titrated to therapeutic dose and is reported as well tolerated, with improved sleep. Discharge is appropriate, discussed with team who overall agrees, will plan for tomorrow. Patient verbalizes understanding of the plan and readiness to continue mental health care in the outpatient setting. Supportive counseling provided.  Sleep: Good  Appetite:  Fair  Past Psychiatric History: see h&P Family History:  Family History  Problem Relation Age of Onset   Schizophrenia Sister    Social History:  Social History   Substance and Sexual Activity  Alcohol Use Yes   Alcohol/week: 4.0 standard drinks of alcohol   Types: 4 Glasses of wine per week     Social History   Substance and Sexual  Activity  Drug Use Not Currently   Types: Marijuana    Social History   Socioeconomic History   Marital status: Single    Spouse name: Not on file   Number of children: Not on file   Years of education: Not on file   Highest education level: Not on file  Occupational History   Not on file  Tobacco Use   Smoking status: Every Day    Current packs/day: 0.50    Average packs/day: 0.5 packs/day for 6.4 years (3.2 ttl pk-yrs)    Types: Cigarettes    Start date: 10/2017   Smokeless tobacco: Never  Vaping Use   Vaping status: Never Used  Substance and Sexual Activity   Alcohol use: Yes    Alcohol/week: 4.0 standard drinks of alcohol    Types: 4 Glasses of wine per week   Drug use: Not Currently    Types: Marijuana   Sexual activity: Yes    Birth control/protection: None  Other Topics Concern   Not on file  Social History Narrative   Not on file   Social Drivers of Health   Financial Resource Strain: Not on file  Food Insecurity: No Food Insecurity (02/29/2024)   Hunger Vital Sign    Worried About Running Out of Food in the Last Year: Never true    Ran Out of Food in the Last Year: Never true  Transportation Needs: No Transportation Needs (02/29/2024)   PRAPARE - Administrator, Civil Service (Medical): No    Lack of Transportation (Non-Medical): No  Physical Activity: Not on file  Stress: Not on file  Social Connections: Not on file   Past Medical History:  Past Medical History:  Diagnosis Date   Bipolar 1 disorder (HCC)    Chlamydia    Gonorrhea     Past Surgical History:  Procedure Laterality Date   NO PAST SURGERIES      Current Medications: Current Facility-Administered Medications  Medication Dose Route Frequency Provider Last Rate Last Admin   acetaminophen  (TYLENOL ) tablet 650 mg  650 mg Oral Q6H PRN Jadapalle, Sree, MD   650 mg at 03/03/24 2041   alum & mag hydroxide-simeth (MAALOX/MYLANTA) 200-200-20 MG/5ML suspension 30 mL  30 mL Oral Q4H  PRN Jadapalle, Sree, MD       cholecalciferol  (VITAMIN D3) 25 MCG (1000 UNIT) tablet 1,000 Units  1,000 Units Oral Daily Millington, Matthew E, PA-C   1,000 Units at 03/07/24 9152   haloperidol  lactate (HALDOL ) injection 5 mg  5 mg Intramuscular TID PRN Jadapalle, Sree, MD       And   diphenhydrAMINE  (BENADRYL ) injection 50 mg  50 mg Intramuscular TID PRN Jadapalle, Sree, MD       And   LORazepam  (ATIVAN ) injection 2 mg  2 mg Intramuscular TID PRN Jadapalle, Sree, MD       hydrOXYzine  (ATARAX ) tablet 25 mg  25 mg Oral Q6H PRN Jadapalle, Sree, MD   25 mg at 03/05/24 0840   magnesium  hydroxide (MILK OF MAGNESIA) suspension 30 mL  30 mL Oral Daily PRN Donnelly Mellow, MD       QUEtiapine  (SEROQUEL ) tablet 200 mg  200 mg Oral QHS Millington, Matthew E, PA-C   200 mg at 03/06/24 2105   traZODone  (DESYREL ) tablet 50 mg  50 mg Oral QHS PRN Jadapalle, Sree, MD   50 mg at 03/05/24 2100    Lab Results: No results found for this or any previous visit (from the past 48 hours).  Blood Alcohol level:  Lab Results  Component Value Date   [facility] <15 02/28/2024   ETH <10 11/21/2023    Metabolic Disorder Labs: Lab Results  Component Value Date   HGBA1C 5.1 10/31/2023   MPG 99.67 10/31/2023   No results found for: PROLACTIN Lab Results  Component Value Date   CHOL 136 10/31/2023   TRIG 75 10/31/2023   HDL 37 (L) 10/31/2023   CHOLHDL 3.7 10/31/2023   VLDL 15 10/31/2023   LDLCALC 84 10/31/2023      Psychiatric Specialty Exam:  Presentation  General Appearance:  Casual  Eye Contact: fair Speech: Normal Rate  Speech Volume: Normal    Mood and Affect  Mood: Irritable Improved today, appropriate Affect: Congruent   Thought Process  Thought Processes: Other (comment) (concrete)  Descriptions of Associations:Circumstantial  Orientation:Full (Time, Place and Person)  Thought Content:linear reality based   Hallucinations:No data recorded  Ideas of Reference:Delusions;  Paranoia; Percusatory  Suicidal Thoughts:No data recorded  Homicidal Thoughts:No data recorded   Sensorium  Memory: Immediate Good  Judgment: fair  Insight: improved  Executive Functions  Concentration: Good  Attention Span: Good  Recall: Good  Fund of Knowledge: Good  Language: Good   Psychomotor Activity  Psychomotor Activity:wnl Musculoskeletal: Strength & Muscle Tone: within normal limits Gait & Station: normal Assets  Assets: Manufacturing systems engineer; Social Support; Housing; Leisure Time    Physical Exam: Physical Exam Vitals and nursing note reviewed.  HENT:     Head: Atraumatic.  Eyes:     Extraocular Movements: Extraocular movements intact.  Pulmonary:     Effort: Pulmonary effort is normal.  Neurological:     Mental Status: She is alert and oriented to  person, place, and time.    Review of Systems  Psychiatric/Behavioral:  Negative for hallucinations and suicidal ideas. The patient is nervous/anxious. The patient does not have insomnia.    Blood pressure 101/64, pulse 72, temperature (!) 97.3 F (36.3 C), resp. rate 16, height 5' 4 (1.626 m), weight 108.9 kg, SpO2 99%. Body mass index is 41.21 kg/m.  Diagnosis: Active Problems:   Severe bipolar I disorder, current or most recent episode depressed, with psychotic features (HCC)   PLAN: Safety and Monitoring:  -- Voluntary admission to inpatient psychiatric unit for safety, stabilization and treatment  -- Daily contact with patient to assess and evaluate symptoms and progress in treatment  -- Patient's case to be discussed in multi-disciplinary team meeting  -- Observation Level : q15 minute checks  -- Vital signs:  q12 hours  -- Precautions: suicide, elopement, and assault -- Encouraged patient to participate in unit milieu and in scheduled group therapies  2. Psychiatric Diagnoses and Treatment:     Pt not agreeable with addition of mood stabilizer.            Bipolar 1 disorder  mixed episode Continue Seroquel  to 200 mg nightly   -- The risks/benefits/side-effects/alternatives to this medication were discussed in detail with the patient and time was given for questions. The patient consents to medication trial.                -- Metabolic profile and EKG monitoring obtained while on an atypical antipsychotic (BMI: Lipid Panel: HbgA1c: QTc:)              -- Encouraged patient to participate in unit milieu and in scheduled group therapies                            3. Medical Issues Being Addressed:   No acute concerns noted Restarted home vitamin D   4. Discharge Planning:   -- Social work and case management to assist with discharge planning and identification of hospital follow-up needs prior to discharge  -- Estimated LOS: 3-4 days  Hoy CHRISTELLA Pinal, NP 03/07/2024, 4:54 PM

## 2024-03-07 NOTE — Group Note (Signed)
 Date:  03/07/2024 Time:  6:21 PM  Group Topic/Focus:  Making Healthy Choices:   The focus of this group is to help patients identify negative/unhealthy choices they were using prior to admission and identify positive/healthier coping strategies to replace them upon discharge.    Participation Level:  Did Not Attend   Lauren Poole Lauren Poole 03/07/2024, 6:21 PM

## 2024-03-07 NOTE — Progress Notes (Signed)
   03/07/24 0815  Psych Admission Type (Psych Patients Only)  Admission Status Involuntary  Psychosocial Assessment  Patient Complaints None  Eye Contact Fair  Facial Expression Animated  Affect Preoccupied  Speech Logical/coherent  Interaction Assertive  Motor Activity Slow  Appearance/Hygiene Improved  Behavior Characteristics Cooperative  Mood Pleasant  Thought Process  Coherency WDL  Content WDL  Delusions None reported or observed  Perception WDL  Hallucination None reported or observed  Judgment WDL  Confusion None  Danger to Self  Current suicidal ideation? Denies  Agreement Not to Harm Self Yes  Description of Agreement verbal  Danger to Others  Danger to Others None reported or observed

## 2024-03-07 NOTE — Group Note (Signed)
 [facility] LCSW Group Therapy Note   Group Date: 03/07/2024 Start Time: 1300 End Time: 1400  Type of Therapy/Topic:  Group Therapy:  Feelings about Diagnosis  Participation Level:  Did Not Attend    Description of Group:    This group will allow patients to explore their thoughts and feelings about diagnoses they have received. Patients will be guided to explore their level of understanding and acceptance of these diagnoses. Facilitator will encourage patients to process their thoughts and feelings about the reactions of others to their diagnosis, and will guide patients in identifying ways to discuss their diagnosis with significant others in their lives. This group will be process-oriented, with patients participating in exploration of their own experiences as well as giving and receiving support and challenge from other group members.   Therapeutic Goals: 1. Patient will demonstrate understanding of diagnosis as evidence by identifying two or more symptoms of the disorder:  2. Patient will be able to express two feelings regarding the diagnosis 3. Patient will demonstrate ability to communicate their needs through discussion and/or role plays  Summary of Patient Progress: X   Therapeutic Modalities:   Cognitive Behavioral Therapy Brief Therapy Feelings Identification    Nadara JONELLE Fam, LCSW

## 2024-03-07 NOTE — Group Note (Signed)
 Date:  03/07/2024 Time:  7:02 PM  Group Topic/Focus:  Activity Group: The focus of the group is to promote activity for the patients and to encourage them to go outside to the courtyard and get some fresh air and some exercise.    Participation Level:  Active  Participation Quality:  Appropriate  Affect:  Appropriate  Cognitive:  Appropriate  Insight: Appropriate  Engagement in Group:  Engaged  Modes of Intervention:  Activity  Additional Comments:    Lauren Poole [name] [name] 03/07/2024, 7:02 PM

## 2024-03-07 NOTE — Group Note (Signed)
 Date:  03/07/2024 Time:  5:27 AM  Group Topic/Focus:  Making Healthy Choices:   The focus of this group is to help patients identify negative/unhealthy choices they were using prior to admission and identify positive/healthier coping strategies to replace them upon discharge. Managing Feelings:   The focus of this group is to identify what feelings patients have difficulty handling and develop a plan to handle them in a healthier way upon discharge. Self Care:   The focus of this group is to help patients understand the importance of self-care in order to improve or restore emotional, physical, spiritual, interpersonal, and financial health.    Participation Level:  Minimal  Participation Quality:  Appropriate  Affect:  Appropriate  Cognitive:  Appropriate and Oriented  Insight: Appropriate  Engagement in Group:  Limited  Modes of Intervention:  Discussion and Support  Additional Comments:  N/A  Butler LITTIE Gelineau 03/07/2024, 5:27 AM

## 2024-03-07 NOTE — Plan of Care (Signed)
  Problem: Activity: Goal: Interest or engagement in activities will improve Outcome: Progressing Goal: Sleeping patterns will improve Outcome: Progressing   Problem: Coping: Goal: Ability to demonstrate self-control will improve Outcome: Progressing   

## 2024-03-07 NOTE — Group Note (Signed)
 Recreation Therapy Group Note   Group Topic:Healthy Support Systems  Group Date: 03/07/2024 Start Time: 1000 End Time: 1050 Facilitators: Celestia Jeoffrey BRAVO, LRT, CTRS Location: Craft Room  Group Description: Straw Bridge. Individually, patients were given 10 plastic drinking straws and an equal length of masking tape. Using the materials provided, patients were instructed to build a free-standing bridge-like structure to suspend an everyday item (ex: puzzle box) off the floor or table surface. All materials were required to be used in Secondary school teacher. LRT facilitated post-activity discussion reviewing how we, humans, are like the structure we built; when things get too heavy in our life and we do not have adequate supports/coping skills, then we will fall just like the straw-built structure will. LRT focused on how having a "base" or structure on the bottom was necessary for the object to stand, meaning we must be secure and stable first before building on ourselves or others. Patients were encouraged to name 2 healthy supports in their life and reflect on how the skills used in this activity can be generalized to daily life post discharge.  Goal Area(s) Addressed:  Patient will identify two healthy support systems in their life. Patient will work on Product manager. Patient will verbalize the importance of having a strong and steady "base".  Patient will follow multi-step directions. Patients will engage in creativity and use all provided materials.   Affect/Mood: Appropriate   Participation Level: Active and Engaged   Participation Quality: Independent   Behavior: Appropriate, Calm, and Cooperative   Speech/Thought Process: Coherent   Insight: Good   Judgement: Good   Modes of Intervention: STEM Activity   Patient Response to Interventions:  Attentive, Engaged, Interested , and Receptive   Education Outcome:  Acknowledges education   Clinical  Observations/Individualized Feedback: Willam was active in their participation of session activities and group discussion. Pt identified dogs and nature walks as healthy supports. Pt interacted well with LRT and peers duration of session.   Plan: Continue to engage patient in RT group sessions 2-3x/week.   [address] LRT, CTRS 03/07/2024 1:11 PM

## 2024-03-08 NOTE — Plan of Care (Signed)

## 2024-03-08 NOTE — Group Note (Signed)
 Date:  03/08/2024 Time:  11:19 AM  Group Topic/Focus:  Goals Group:   The focus of this group is to help patients establish daily goals to achieve during treatment and discuss how the patient can incorporate goal setting into their daily lives to aide in recovery.    Participation Level:  Did Not Attend   Lauren Poole [facility] 03/08/2024, 11:19 AM

## 2024-03-08 NOTE — Progress Notes (Signed)
 [facility] Discharge Suicide Risk Assessment   Principal Problem: <principal problem not specified> Discharge Diagnoses: Active Problems:   Severe bipolar I disorder, current or most recent episode depressed, with psychotic features (HCC)   Total Time spent with patient: 30 minutes  Musculoskeletal: Strength & Muscle Tone: within normal limits Gait & Station: normal   Psychiatric Specialty Exam  Presentation  General Appearance:  Appropriate for Environment  Eye Contact: Fair  Speech: Clear and Coherent  Speech Volume: Decreased  Handedness: Right   Mood and Affect  Mood: Depressed  Duration of Depression Symptoms: No data recorded Affect: Flat   Thought Process  Thought Processes: Goal Directed; Linear  Descriptions of Associations:Intact  Orientation:Full (Time, Place and Person)  Thought Content:WDL  History of Schizophrenia/Schizoaffective disorder:No  Duration of Psychotic Symptoms:N/A  Hallucinations:Hallucinations: None  Ideas of Reference:None  Suicidal Thoughts:Suicidal Thoughts: No  Homicidal Thoughts:Homicidal Thoughts: No   Sensorium  Memory: Immediate Good; Recent Good  Judgment: Fair  Insight: Fair   Art therapist  Concentration: Good  Attention Span: Good  Recall: Good  Fund of Knowledge: Good  Language: Good   Psychomotor Activity  Psychomotor Activity:Psychomotor Activity: Normal   Assets  Assets: Desire for Improvement; Communication Skills; Housing; Physical Health; Social Support   Sleep  Sleep:Sleep: Good  Estimated Sleeping Duration (Last 24 Hours): 8.75-10.75 hours  Physical Exam: Physical Exam ROS Blood pressure 101/64, pulse 72, temperature (!) 97.3 F (36.3 C), resp. rate 16, height 5' 4 (1.626 m), weight 108.9 kg, SpO2 99%. Body mass index is 41.21 kg/m.  Mental Status Per Nursing Assessment::   On Admission:  NA  Demographic Factors:  Low socioeconomic status  Loss  Factors: NA  Historical Factors: NA  Risk Reduction Factors:   Sense of responsibility to family, Living with another person, especially a relative, Positive social support, Positive therapeutic relationship, and Positive coping skills or problem solving skills  Continued Clinical Symptoms:  Previous Psychiatric Diagnoses and Treatments  Cognitive Features That Contribute To Risk:  None    Suicide Risk:  Minimal: No identifiable suicidal ideation.  Patients presenting with no risk factors but with morbid ruminations; may be classified as minimal risk based on the severity of the depressive symptoms   Follow-up Information     [facility]. Go to.   Why: Virtual assessment for therapy and psychiatry is  03/14/24 at 8:30 AM. Contact information: [address]  [address] [place] [address] [phone]                Hoy CHRISTELLA Pinal, NP 03/08/2024, 9:35 AM

## 2024-03-08 NOTE — Progress Notes (Signed)
 Pt visible in milieu and noted engaging and interacting with selective peers, attended and participated in wrap up group. Pt took HS medication; she denied SI/HI, depression and anxiety    03/08/24 0000  Psych Admission Type (Psych Patients Only)  Admission Status Involuntary  Psychosocial Assessment  Patient Complaints None  Eye Contact Fair  Facial Expression Animated  Affect Preoccupied  Speech Logical/coherent  Interaction Assertive  Motor Activity Other (Comment) (WDL)  Appearance/Hygiene Improved  Behavior Characteristics Cooperative;Appropriate to situation  Mood Pleasant  Aggressive Behavior  Effect No apparent injury  Thought Process  Coherency WDL  Content WDL  Delusions None reported or observed  Perception WDL  Hallucination None reported or observed  Judgment WDL  Confusion None  Danger to Self  Current suicidal ideation? Denies  Agreement Not to Harm Self Yes  Description of Agreement VERBAL  Danger to Others  Danger to Others None reported or observed    Problem: Education: Goal: Knowledge of [place] [facility] Education information/materials will improve Outcome: Progressing Goal: Emotional status will improve Outcome: Progressing Goal: Mental status will improve Outcome: Progressing Goal: Verbalization of understanding the information provided will improve Outcome: Progressing   Problem: Activity: Goal: Interest or engagement in activities will improve Outcome: Progressing Goal: Sleeping patterns will improve Outcome: Progressing   Problem: Coping: Goal: Ability to verbalize frustrations and anger appropriately will improve Outcome: Progressing Goal: Ability to demonstrate self-control will improve Outcome: Progressing

## 2024-03-08 NOTE — Progress Notes (Signed)
 Patient denies SI/I/AVH at this time. Denies any plan, intent, and means for SI. Discharge instructions, AVS, prescriptions, and transition record reviewed with patient. Patient agrees to comply with medication management, follow-up visit and outpatient therapy. Patient belongings returned to patient. Patient questions and concerns addressed and answered.  Patient ambulatory off unit. Patient discharged via Taxi to family's house.

## 2024-03-08 NOTE — Progress Notes (Signed)
  [facility] Adult Case Management Discharge Plan :  Will you be returning to the same living situation after discharge:  Yes,  Patient to return to her mother's home.  At discharge, do you have transportation home?: Yes,  CSW has arranged taxi services on patient's behalf.  Do you have the ability to pay for your medications: Yes,  [place] MEDICAID PREPAID HEALTH PLAN / [place] MEDICAID HEALTHY BLUE  Release of information consent forms completed and in the chart;  Patient's signature needed at discharge.  Patient to Follow up at:  Follow-up Information     [facility]. Go to.   Why: Virtual assessment for therapy and psychiatry is  03/14/24 at 8:30 AM. Contact information: [address]  [address] [place] [address] [phone]                 Next level of care provider has access to [facility] Link:no  Safety Planning and Suicide Prevention discussed: No. Patient Refusal for Family/Significant Other Suicide Prevention Education: The patient Brelee Ailes has refused to provide written consent for family/significant other to be provided Family/Significant Other Suicide Prevention Education during admission and/or prior to discharge.  Physician notified.   SPE completed with pt, as pt refused to consent to family contact. SPI pamphlet provided to pt and pt was encouraged to share information with support network, ask questions, and talk about any concerns relating to SPE. Pt denies access to guns/firearms and verbalized understanding of information provided. Mobile Crisis information also provided to pt.       Has patient been referred to the Quitline?: Patient refused referral for treatment  Patient has been referred for addiction treatment: No known substance use disorder.  Alveta CHRISTELLA Kerns, LCSW 03/08/2024, 9:18 AM

## 2024-03-09 NOTE — Discharge Summary (Signed)
 Physician Discharge Summary Note  Patient:  Lauren Poole is an 33 y.o., female MRN:  [id number] DOB:  [birth year] Patient phone:  [phone] (home)  Patient address:   [address] [place] [address],    Date of Admission:  02/29/2024 Date of Discharge: 03/08/24  Reason for Admission:   33 year old female patient with a history of bipolar 1 disorder vs mdd.  2 recent hospitalizations this year for paranoid thinking and mood dysregulation.  She was recently discharged in May 2025 on Lexapro  and trazodone  and has been medication noncompliant.  She presented to the ED with SI and paranoia that she has been redline to and that the government is against her and this is why she cannot get a job or have stable housing.  She feels her mother is trying to control her life.  She does not discuss what the plans for suicide were.  She denies SI today.  Has previous self-harm cutting her wrists.  Reported to ED staff that she had not been sleeping however she reports she slept last night.  She has not quantified for sleep.  Currently unemployed.  Reports she drinks a bottle of wine 3 nights a week.  UDS was negative.  BAL on arrival was negative.  Notably in the ED she talked about hurting people and during the exam she talks about she came here because she did not want to hurt people but she is guarded and will not further express on that.  She has made previous threats to harm her mother.  She did not appear internally preoccupied during the exam.  She did frequently seem guarded related to her delusions of persecution and control.  She refused to answer most lines of questioning.  Discussed medications that she initially refused all medications later was agreeable to a trial of Seroquel .    Discharge Diagnoses: Active Problems:   Severe bipolar I disorder, current or most recent episode depressed, with psychotic features [facility])  Psychiatric History:  Information collected  from patient and chart review   Prev Dx/Sx: Bipolar 1 disorder and MDD Current Psych Provider: None Home Meds (current): Noncompliant Previous Med Trials: Lexapro , olanzapine , trazodone , hydroxyzine  Therapy: None   Prior Psych Hospitalization: 2 hospitalizations this year Prior Self Harm: Suicide attempt this year Prior Violence: Patient denies   Family Psych History: None reported Family Hx suicide: None reported   Social History:  Developmental Hx: None reported Educational Hx: Unable to assess Occupational Hx: Unemployed Legal Hx: Denies Living Situation: With mother Spiritual Hx: None reported Access to weapons/lethal means: Denied   Substance History Alcohol: Bottle of wine 3 nights a week Denies further substance use and UDS was negative.   Social History:  Social History   Substance and Sexual Activity  Alcohol Use Yes   Alcohol/week: 4.0 standard drinks of alcohol   Types: 4 Glasses of wine per week     Social History   Substance and Sexual Activity  Drug Use Not Currently   Types: Marijuana    Social History   Socioeconomic History   Marital status: Single    Spouse name: Not on file   Number of children: Not on file   Years of education: Not on file   Highest education level: Not on file  Occupational History   Not on file  Tobacco Use   Smoking status: Every Day    Current packs/day: 0.50    Average packs/day: 0.5 packs/day for 6.4 years (3.2 ttl pk-yrs)  Types: Cigarettes    Start date: 10/2017   Smokeless tobacco: Never  Vaping Use   Vaping status: Never Used  Substance and Sexual Activity   Alcohol use: Yes    Alcohol/week: 4.0 standard drinks of alcohol    Types: 4 Glasses of wine per week   Drug use: Not Currently    Types: Marijuana   Sexual activity: Yes    Birth control/protection: None  Other Topics Concern   Not on file  Social History Narrative   Not on file   Social Drivers of Health   Financial Resource Strain: Not  on file  Food Insecurity: No Food Insecurity (02/29/2024)   Hunger Vital Sign    Worried About Running Out of Food in the Last Year: Never true    Ran Out of Food in the Last Year: Never true  Transportation Needs: No Transportation Needs (02/29/2024)   PRAPARE - Administrator, Civil Service (Medical): No    Lack of Transportation (Non-Medical): No  Physical Activity: Not on file  Stress: Not on file  Social Connections: Not on file   Past Medical History:  Past Medical History:  Diagnosis Date   Bipolar 1 disorder (HCC)    Chlamydia    Gonorrhea     Past Surgical History:  Procedure Laterality Date   NO PAST SURGERIES     Family History:  Family History  Problem Relation Age of Onset   Schizophrenia Sister     Hospital Course:   The patient is a 33 year old female with a psychiatric history notable for bipolar I disorder versus major depressive disorder. She was admitted for acute psychiatric stabilization following a period of mood dysregulation and paranoid thinking. This marks her second psychiatric hospitalization within the year for similar decompensation.  During her inpatient stay, she was started on Seroquel  100 mg at bedtime and titrated to 200 mg nightly. While she was not agreeable to the addition of a mood stabilizer, she tolerated the antipsychotic well with no side effects reported or observed. There was no evidence of overt psychosis during her stay. Although some guardedness and flat affect persisted at discharge, thought processes remained linear and there was no disorganized behavior noted.  Throughout her stay, the patient displayed little interest in engaging with staff or peers but remained cooperative and non-disruptive. She denied suicidal and homicidal ideation and did not exhibit any self-harm, aggressive, or inappropriate behaviors. Her continued guarded presentation and mild irritability were monitored, but did not warrant further medication  escalation, particularly in the context of her refusal.  Mood symptoms improved with Seroquel . Residual guardedness and irritability were present, but the patient no longer met criteria for inpatient psychiatric hospitalization. She demonstrated insight into her need for a safe and structured environment and verbalized feeling safe with her mother. The patient is being discharged to her mother's home. Outpatient psychiatric follow-up is recommended for continued monitoring and medication management. She is encouraged to explore ongoing psychotherapy to support mood stabilization and build coping skills.   Currently, all modifiable risk of harm to self/harm to others have been addressed and patient is no longer appropriate for the acute inpatient setting and is able to continue treatment for mental health needs in the community with the supports as indicated below.  Patient is educated and verbalized understanding of discharge plan of care including medications, follow-up appointments, mental health resources and further crisis services in the community.  He is instructed to call 911 or present to the  nearest emergency room should he experience any decompensation in mood, disturbance of bowel or return of suicidal/homicidal ideations.  Patient verbalizes understanding of this education and agrees to this plan of care   Psychiatric Specialty Exam: Presentation  General Appearance:  Casual   Eye Contact: fair Speech: Normal Rate   Speech Volume: Normal       Mood and Affect  Mood: Irritable with no behaviors Affect: flat   Thought Process  Thought Processes: linear   Descriptions of Associations:Circumstantial   Orientation:Full (Time, Place and Person)   Thought Content:linear reality based    Hallucinations:denies none noted   Ideas of Reference: reality based yet guarded   Suicidal Thoughts: denies    Homicidal Thoughts:denies      Sensorium  Memory: Immediate Good    Judgment: fair   Insight: improved   Executive Functions  Concentration: Good   Attention Span: Good   Recall: Good   Fund of Knowledge: Good   Language: Good    Psychomotor Activity  Psychomotor Activity: Psychomotor Activity: Normal  Musculoskeletal: Strength & Muscle Tone: within normal limits Gait & Station: normal Assets  Assets: Desire for Improvement; Communication Skills; Housing; Physical Health; Social Support   Sleep  Sleep: Sleep: Good    Physical Exam: Physical Exam ROS Blood pressure 101/64, pulse 72, temperature (!) 97.3 F (36.3 C), resp. rate 16, height 5' 4 (1.626 m), weight 108.9 kg, SpO2 99%. Body mass index is 41.21 kg/m.   Social History   Tobacco Use  Smoking Status Every Day   Current packs/day: 0.50   Average packs/day: 0.5 packs/day for 6.4 years (3.2 ttl pk-yrs)   Types: Cigarettes   Start date: 10/2017  Smokeless Tobacco Never   Tobacco Cessation:  N/A, patient does not currently use tobacco products   Blood Alcohol level:  Lab Results  Component Value Date   [facility] <15 02/28/2024   ETH <10 11/21/2023    Metabolic Disorder Labs:  Lab Results  Component Value Date   HGBA1C 5.1 10/31/2023   MPG 99.67 10/31/2023   No results found for: PROLACTIN Lab Results  Component Value Date   CHOL 136 10/31/2023   TRIG 75 10/31/2023   HDL 37 (L) 10/31/2023   CHOLHDL 3.7 10/31/2023   VLDL 15 10/31/2023   LDLCALC 84 10/31/2023    See Psychiatric Specialty Exam and Suicide Risk Assessment completed by Attending Physician prior to discharge.  Discharge destination:  Home  Is patient on multiple antipsychotic therapies at discharge:  No   Has Patient had three or more failed trials of antipsychotic monotherapy by history:  No  Recommended Plan for Multiple Antipsychotic Therapies: NA  Discharge Instructions     Diet - low sodium heart healthy   Complete by: As directed       Allergies as of 03/08/2024    No Known Allergies      Medication List     TAKE these medications      Indication  QUEtiapine  200 MG tablet Commonly known as: SEROQUEL  Take 1 tablet (200 mg total) by mouth at bedtime.  Indication: Depressive Phase of Manic-Depression   vitamin D3 25 MCG tablet Commonly known as: CHOLECALCIFEROL  Take 1 tablet (1,000 Units total) by mouth daily. What changed:  medication strength how much to take  Indication: Vitamin D  Deficiency        Follow-up Information     [facility]. Go to.   Why: Virtual assessment for therapy and psychiatry is  03/14/24 at 8:30 AM.  Contact information: [address]  [address] [place] [address] [phone]                   Signed: Hoy CHRISTELLA Pinal, NP 03/09/2024, 5:47 PM

## 2024-03-22 ENCOUNTER — Encounter (HOSPITAL_COMMUNITY): Payer: Self-pay

## 2024-03-22 ENCOUNTER — Other Ambulatory Visit: Payer: Self-pay

## 2024-03-22 ENCOUNTER — Emergency Department (HOSPITAL_COMMUNITY): Payer: MEDICAID

## 2024-03-22 ENCOUNTER — Emergency Department (HOSPITAL_COMMUNITY)
Admission: EM | Admit: 2024-03-22 | Discharge: 2024-03-22 | Disposition: A | Discharge status: Home / Self Care | Payer: MEDICAID | Attending: Emergency Medicine | Admitting: Emergency Medicine

## 2024-03-22 DIAGNOSIS — F5105 Insomnia due to other mental disorder: Secondary | ICD-10-CM | POA: Diagnosis not present

## 2024-03-22 DIAGNOSIS — G4700 Insomnia, unspecified: Secondary | ICD-10-CM | POA: Diagnosis present

## 2024-03-22 DIAGNOSIS — F99 Mental disorder, not otherwise specified: Secondary | ICD-10-CM | POA: Diagnosis not present

## 2024-03-22 DIAGNOSIS — F17210 Nicotine dependence, cigarettes, uncomplicated: Secondary | ICD-10-CM | POA: Diagnosis not present

## 2024-03-22 LAB — CBC
HCT: 39.8 % (ref 36.0–46.0)
Hemoglobin: 13.3 g/dL (ref 12.0–15.0)
MCH: 31.7 pg (ref 26.0–34.0)
MCHC: 33.4 g/dL (ref 30.0–36.0)
MCV: 95 fL (ref 80.0–100.0)
Platelets: 217 K/uL (ref 150–400)
RBC: 4.19 MIL/uL (ref 3.87–5.11)
RDW: 13.1 % (ref 11.5–15.5)
WBC: 5.2 K/uL (ref 4.0–10.5)
nRBC: 0 % (ref 0.0–0.2)

## 2024-03-22 LAB — COMPREHENSIVE METABOLIC PANEL WITH GFR
ALT: 24 U/L (ref 0–44)
AST: 22 U/L (ref 15–41)
Albumin: 3.9 g/dL (ref 3.5–5.0)
Alkaline Phosphatase: 76 U/L (ref 38–126)
Anion gap: 9 (ref 5–15)
BUN: 7 mg/dL (ref 6–20)
CO2: 20 mmol/L — ABNORMAL LOW (ref 22–32)
Calcium: 9.3 mg/dL (ref 8.9–10.3)
Chloride: 106 mmol/L (ref 98–111)
Creatinine, Ser: 0.74 mg/dL (ref 0.44–1.00)
GFR, Estimated: >60 mL/min (ref >60)
Glucose, Bld: 94 mg/dL (ref 70–99)
Potassium: 3.9 mmol/L (ref 3.5–5.1)
Sodium: 135 mmol/L (ref 135–145)
Total Bilirubin: 0.5 mg/dL (ref 0.0–1.2)
Total Protein: 6.9 g/dL (ref 6.5–8.1)

## 2024-03-22 LAB — TROPONIN I (HIGH SENSITIVITY): Troponin I (High Sensitivity): 4 ng/L (ref <18)

## 2024-03-22 LAB — ETHANOL: Alcohol, Ethyl (B): <15 mg/dL (ref <15)

## 2024-03-22 LAB — HCG, SERUM, QUALITATIVE: Preg, Serum: NEGATIVE

## 2024-03-22 MED ORDER — QUETIAPINE FUMARATE 100 MG PO TABS: 100.0000 mg | ORAL_TABLET | Freq: Every day | ORAL | Status: DC

## 2024-03-22 MED ORDER — QUETIAPINE FUMARATE 200 MG PO TABS: 200.0000 mg | ORAL_TABLET | Freq: Every day | ORAL | 0 refills | Status: DC

## 2024-03-22 NOTE — Discharge Instructions (Addendum)
 While you were in the emergency room, you have blood work done that was normal.  You received a dose of your Seroquel .  I sent a few days of your Seroquel  to your pharmacy.  Please get your medications from your mother.  Return to the emergency department as needed for your health care.  Follow-up with your PCP.

## 2024-03-22 NOTE — ED Provider Triage Note (Signed)
 Emergency Medicine Provider Triage Evaluation Note  Ori Kreiter , a 33 y.o. female  was evaluated in triage.  Pt complains of chest discomfort. Onset :2 days Context : after salad dressing Worsened: rest Better : with exertion RF: smoking/ HTN  Denies: n/v/diaphoresis or FmHx CAD  Also left her seroquel  at her moty[place] house out of town and hasn't been able to sleep for the past 2 days.  Review of Systems  Positive: Chest pressure Negative: fever  Physical Exam  BP (!) 124/111   Pulse 76   Temp 98.7 F (37.1 C)   Resp 16   SpO2 100%  Gen:   Awake, no distress   Resp:  Normal effort  MSK:   Moves extremities without difficulty  Other:  LCTAB  Medical Decision Making  Medically screening exam initiated at 1:26 PM.  Appropriate orders placed.  Avarey Hurtubise was informed that the remainder of the evaluation will be completed by another provider, this initial triage assessment does not replace that evaluation, and the importance of remaining in the ED until their evaluation is complete.     Arloa Chroman, PA-C 03/22/24 1329

## 2024-03-22 NOTE — ED Provider Notes (Signed)
 [place] EMERGENCY DEPARTMENT AT [facility] Provider Note  CSN: [id number] Arrival date & time: 03/22/24 1311  Chief Complaint(s) Insomnia and Chest Pain  HPI Lauren Poole is a 33 y.o. female who is here today because she has not been able to sleep the last couple of days.  Patient reportedly left her Seroquel  at her mother's house out of town.  Also endorses chest pain of 2 weeks duration.  Patient psychiatric hospitalization.  Patient denies SI or HI.   Past Medical History Past Medical History:  Diagnosis Date   Bipolar 1 disorder (HCC)    Chlamydia    Gonorrhea    Patient Active Problem List   Diagnosis Date Noted   Severe bipolar I disorder, current or most recent episode depressed, with psychotic features (HCC) 02/29/2024   Psychosis (HCC) 02/29/2024   MDD (major depressive disorder), single episode, severe with psychosis (HCC) 11/22/2023   Generalized anxiety disorder 11/21/2023   Abnormal urinalysis 10/30/2023   MDD (major depressive disorder), recurrent, severe, with psychosis (HCC) 10/28/2023   MDD (major depressive disorder) 10/28/2023   Home Medication(s) Prior to Admission medications   Medication Sig Start Date End Date Taking? Authorizing Provider  QUEtiapine  (SEROQUEL ) 200 MG tablet Take 1 tablet (200 mg total) by mouth at bedtime for 3 days. 03/22/24 03/25/24 Yes Mannie Pac T, DO  cholecalciferol  (CHOLECALCIFEROL ) 25 MCG tablet Take 1 tablet (1,000 Units total) by mouth daily. 03/08/24   Cleotilde Hoy [name], NP                                                                                                                                    Past Surgical History Past Surgical History:  Procedure Laterality Date   NO PAST SURGERIES     Family History Family History  Problem Relation Age of Onset   Schizophrenia Sister     Social History Social History   Tobacco Use   Smoking status: Every Day    Current packs/day: 0.50    Average  packs/day: 0.5 packs/day for 6.4 years (3.2 ttl pk-yrs)    Types: Cigarettes    Start date: 10/2017   Smokeless tobacco: Never  Vaping Use   Vaping status: Never Used  Substance Use Topics   Alcohol use: Yes    Alcohol/week: 4.0 standard drinks of alcohol    Types: 4 Glasses of wine per week   Drug use: Not Currently    Types: Marijuana   Allergies Patient has no known allergies.  Review of Systems Review of Systems  Physical Exam Vital Signs  I have reviewed the triage vital signs BP (!) 124/111   Pulse 76   Temp 98.7 F (37.1 C)   Resp 16   Ht 5' 4 (1.626 m)   Wt 108.9 kg   SpO2 100%   BMI 41.20 kg/m   Physical Exam Vitals and nursing note reviewed.  Eyes:  Pupils: Pupils are equal, round, and reactive to light.  Cardiovascular:     Rate and Rhythm: Normal rate.     Heart sounds: Normal heart sounds.  Pulmonary:     Breath sounds: Normal breath sounds.  Abdominal:     Palpations: Abdomen is soft.  Musculoskeletal:        General: Normal range of motion.  Skin:    General: Skin is warm.  Neurological:     Mental Status: She is alert.  Psychiatric:        Mood and Affect: Mood normal.     Comments: No SI, no HI.  Patient is not responding to internal stimuli.  Thoughts are linear, concrete nature.  Patient focused on being able to go home to get good night of sleep.     ED Results and Treatments Labs (all labs ordered are listed, but only abnormal results are displayed) Labs Reviewed  COMPREHENSIVE METABOLIC PANEL WITH GFR - Abnormal; Notable for the following components:      Result Value   CO2 20 (*)    All other components within normal limits  CBC  ETHANOL  HCG, SERUM, QUALITATIVE  TROPONIN I (HIGH SENSITIVITY)  TROPONIN I (HIGH SENSITIVITY)                                                                                                                          Radiology DG Chest 2 View Result Date: 03/22/2024 CLINICAL DATA:  Chest  discomfort EXAM: CHEST - 2 VIEW COMPARISON:  None available. FINDINGS: Cardiomediastinal silhouette and pulmonary vasculature are within normal limits. Lungs are clear. Mild anterior wedging of the T7 and T8 vertebral bodies are of uncertain chronicity. IMPRESSION: No acute cardiopulmonary process. Electronically Signed   By: Aliene Lloyd M.D.   On: 03/22/2024 14:34    Pertinent labs & imaging results that were available during my care of the patient were reviewed by me and considered in my medical decision making (see MDM for details).  Medications Ordered in ED Medications  QUEtiapine  (SEROQUEL ) tablet 100 mg (has no administration in time range)                                                                                                                                     Procedures Procedures  (including critical care time)  Medical Decision Making / ED Course   This patient  presents to the ED for concern of difficulty sleeping, this involves an extensive number of treatment options, and is a complaint that carries with it a high risk of complications and morbidity.  The differential diagnosis includes medication noncompliance.  MDM: Patient's primary concern has not been able to sleep, given that she has been taking 200 mg of Seroquel  each evening, and now is not taking it, that likely is contributing to her difficulty with sleeping.  Patient had blood work done at triage, troponin was negative.  CBC and BMP reviewed, no acute abnormalities.  My independent review of the patient's EKG shows no ST segment depressions or elevations, no T wave inversions, no evidence of acute ischemia.  Patient has psychiatric history, however does not appear to be acutely decompensated at this time.  Likely just needs to be on her medications.  Will give her dose of Seroquel  here in the ED.  She is going to get her medications from her mother.  Will discharge.   Additional history  obtained:  -External records from outside source obtained and reviewed including: Chart review including previous notes, labs, imaging, consultation notes   Lab Tests: -I ordered, reviewed, and interpreted labs.   The pertinent results include:   Labs Reviewed  COMPREHENSIVE METABOLIC PANEL WITH GFR - Abnormal; Notable for the following components:      Result Value   CO2 20 (*)    All other components within normal limits  CBC  ETHANOL  HCG, SERUM, QUALITATIVE  TROPONIN I (HIGH SENSITIVITY)  TROPONIN I (HIGH SENSITIVITY)      EKG my independent review of the patient's EKG shows no ST segment depressions or elevations, no T wave inversions, no evidence of acute ischemia.  EKG Interpretation Date/Time:  Wednesday March 22 2024 13:35:08 EDT Ventricular Rate:  69 PR Interval:  142 QRS Duration:  82 QT Interval:  374 QTC Calculation: 400 R Axis:   17  Text Interpretation: Normal sinus rhythm Nonspecific T wave abnormality Abnormal ECG When compared with ECG of 28-Feb-2024 19:30, PREVIOUS ECG IS PRESENT Confirmed by Mannie Pac [phone]) on 03/22/2024 3:42:01 PM         Imaging Studies ordered: I ordered imaging studies including chest x-ray I independently visualized and interpreted imaging. I agree with the radiologist interpretation   Medicines ordered and prescription drug management: Meds ordered this encounter  Medications   QUEtiapine  (SEROQUEL ) tablet 100 mg   QUEtiapine  (SEROQUEL ) 200 MG tablet    Sig: Take 1 tablet (200 mg total) by mouth at bedtime for 3 days.    Dispense:  3 tablet    Refill:  0    -I have reviewed the patients home medicines and have made adjustments as needed    Cardiac Monitoring: The patient was maintained on a cardiac monitor.  I personally viewed and interpreted the cardiac monitored which showed an underlying rhythm of: Normal sinus rhythm  Social Determinants of Health:  Factors impacting patients care include: Lack of  primary care   Reevaluation: After the interventions noted above, I reevaluated the patient and found that they have :improved  Co morbidities that complicate the patient evaluation  Past Medical History:  Diagnosis Date   Bipolar 1 disorder (HCC)    Chlamydia    Gonorrhea       Dispostion: I considered admission for this patient, however she is appropriate for discharge.     Final Clinical Impression(s) / ED Diagnoses Final diagnoses:  Insomnia due to other mental disorder     @  [facility]    Mannie Pac T, DO 03/22/24 1544

## 2024-03-22 NOTE — ED Triage Notes (Addendum)
 Pt from home with ems with c.o insomnia. Pt takes Seroquel  nightly for sleep. States she hasn't had it in 2 days because her mom took it away to start managing her medications due to history of harming herself. Pt denies SI/HI with EMS.  Pt also c.o chest tightness x 2 weeks.

## 2024-03-23 ENCOUNTER — Other Ambulatory Visit: Payer: Self-pay

## 2024-03-23 ENCOUNTER — Ambulatory Visit (HOSPITAL_COMMUNITY)
Admission: EM | Admit: 2024-03-23 | Discharge: 2024-03-23 | Disposition: A | Discharge status: Home / Self Care | Payer: MEDICAID | Attending: Nurse Practitioner | Admitting: Nurse Practitioner

## 2024-03-23 ENCOUNTER — Encounter (HOSPITAL_COMMUNITY): Payer: Self-pay

## 2024-03-23 ENCOUNTER — Emergency Department (HOSPITAL_COMMUNITY)
Admission: EM | Admit: 2024-03-23 | Discharge: 2024-03-23 | Disposition: A | Discharge status: Home / Self Care | Payer: MEDICAID | Attending: Emergency Medicine | Admitting: Emergency Medicine

## 2024-03-23 DIAGNOSIS — F330 Major depressive disorder, recurrent, mild: Secondary | ICD-10-CM | POA: Insufficient documentation

## 2024-03-23 DIAGNOSIS — Z711 Person with feared health complaint in whom no diagnosis is made: Secondary | ICD-10-CM | POA: Diagnosis present

## 2024-03-23 DIAGNOSIS — F319 Bipolar disorder, unspecified: Secondary | ICD-10-CM | POA: Insufficient documentation

## 2024-03-23 DIAGNOSIS — Z5986 Financial insecurity: Secondary | ICD-10-CM | POA: Insufficient documentation

## 2024-03-23 DIAGNOSIS — Z5900 Homelessness unspecified: Secondary | ICD-10-CM | POA: Diagnosis present

## 2024-03-23 DIAGNOSIS — Z79899 Other long term (current) drug therapy: Secondary | ICD-10-CM | POA: Insufficient documentation

## 2024-03-23 DIAGNOSIS — F603 Borderline personality disorder: Secondary | ICD-10-CM | POA: Insufficient documentation

## 2024-03-23 DIAGNOSIS — Z62820 Parent-biological child conflict: Secondary | ICD-10-CM | POA: Insufficient documentation

## 2024-03-23 DIAGNOSIS — Z638 Other specified problems related to primary support group: Secondary | ICD-10-CM | POA: Insufficient documentation

## 2024-03-23 DIAGNOSIS — Z9152 Personal history of nonsuicidal self-harm: Secondary | ICD-10-CM | POA: Insufficient documentation

## 2024-03-23 DIAGNOSIS — Z560 Unemployment, unspecified: Secondary | ICD-10-CM | POA: Insufficient documentation

## 2024-03-23 MED ORDER — QUETIAPINE FUMARATE 100 MG PO TABS
200.0000 mg | ORAL_TABLET | ORAL | Status: AC
  Administered 2024-03-23: 200 mg via ORAL
  Filled 2024-03-23: qty 2

## 2024-03-23 NOTE — ED Notes (Signed)
 Patient discharged home. Taxi voucher supplied. Patient belongings returned complete and intact. AVS reviewed, follow-up care reviewed. Patient escorted to lobby with staff, safety maintained.

## 2024-03-23 NOTE — ED Provider Notes (Signed)
 Behavioral Health Urgent Care Medical Screening Exam  Patient Name: Lauren Poole MRN: [id number] Date of Evaluation: 03/23/24 Chief Complaint:  feeling bad Diagnosis:  Final diagnoses:  Borderline personality disorder (HCC)  Mild episode of recurrent major depressive disorder (HCC)   History of Present illness: Lauren Poole is a 33 y.o. female with a prior mental health history significant for MDD, and bipolar 1 disorder, who presented today accompanied by law enforcement to the [facility] with complaints of worsening feelings of isolation.  On assessment, patient reports that she feels lonely, has no friends, and called law enforcement earlier today morning, stating that she was feeling suicidal, but states that she was not actually feeling suicidal.  She states that she was just wanting some company, and by the time she reached the hospital, she was wanting to go back home.  Patient reports financial stressors, reports a strained relationship with her mother, states that her mother wants her to figure life on my own, states that when I am stressed, everything goes haywire, she reports financial stressors, states that she is not currently working, does not involved in any activities during the day, which has been worsening her feelings of isolation.  She reports self-injurious behaviors, but states that she self injured just once, points to an area to her left forearm, with no marks visible, repeatedly asking to go home. Refuses for her mother to be called for collateral information and states that her mother is no longer supportive and wants her to figure things herself.   She is not receptive to an IOP program, states that she had the tendency to get too stimulated.  Patient is receptive to a referral being placed for a community assertive treatment team for her.  She reports that she does not currently have any medications, and the only medication  she takes is Seroquel  200 mg nightly.  She has not been sleeping due to not having this medication.  She reports that she left the medication at her mother's home.  As per chart review, patient was last hospitalized at [facility] on 07/15 and discharged on 03/08/2024.  As per chart review, she resides with her mother, which is she does not want her mother being called.   Pt presents with a euthymic mood & her attention to personal hygiene and grooming is fair, eye contact is good, speech is clear & coherent. Thought contents are organized and logical, and pt currently denies SI/HI/AVH or paranoia. There is no evidence of delusional thoughts.  She presents with some speech hesitancy, denies first rank symptoms. Sitting with a teddy bear holding it as if holding onto a baby.    [place] Suicide Risk assessment:  1. Do you wish to be dead? NO 2. Have you wished your dead or wished you could go to sleep and not wake up?  NO 3.  Have you actually had thoughts of killing yourself?   NO 4.  Have you been thinking about how you might do this?   NO 5.  Have you had these thoughts and some intention of acting on them?  NO 6.  Have you started to work out or worked out the details to kill yourself?  NO 7.  Do you intend to carry out this plan? NO 8. On a scale of 1-5 with 1 being the least severe and 5 being the most severe answer the following questions place for intensity of ideation. ZERO 9. How many times have you had  these thoughts? NO 10. When you have the thoughts how long to the last?  NO 11. Control ability.  Could you or can you stop thinking about killing herself or wanting to die if you want to?  YES 12. Are there any things anyone or anything family religion pain of death that stop you from wanting to die or acting on thoughts of committing suicide?  FAMILY 13.  What sort of reason to do have to think about wanting to die or killing yourself? NONE  14. Have you done anything, started to do  anything,  or prepared to do anything to end your life? Examples: Took pills, tried to shoot yourself, cut yourself, tried to hang yourself,  took out pills but didn't swallow any, held a gun but changed your mind or it was  grabbed from your hand, went to the roof but didn't jump, collected pills, obtained  a gun, gave away valuables, wrote a will or suicide note, etc. NO.  Suicide Risk: Minimal: No identifiable suicidal ideation.  Patients presenting with no risk factors but with morbid ruminations; may be classified as minimal risk based on the severity of the depressive symptoms.   Recommendations: Appointment made by CSW with Assertive community treatment team. Patient agreeable to following up with them tomorrow 03/23/24 and nursing educated to send patient home in a  cab. She denies any intent or plan to harm herself.  Flowsheet Row ED from 03/23/2024 in [facility] ED from 03/22/2024 in [facility] Emergency Department at [facility] Admission (Discharged) from 02/29/2024 in [facility] INPATIENT BEHAVIORAL MEDICINE  C-SSRS RISK CATEGORY Moderate Risk No Risk No Risk    Psychiatric Specialty Exam  Presentation  General Appearance:Fairly Groomed  Eye Contact:Fair  Speech:Clear and Coherent  Speech Volume:Normal  Handedness:Right   Mood and Affect  Mood: Depressed; Anxious  Affect: Congruent   Thought Process  Thought Processes: Coherent  Descriptions of Associations:Intact  Orientation:Full (Time, Place and Person)  Thought Content:Logical  Diagnosis of Schizophrenia or Schizoaffective disorder in past: No   Hallucinations:None NA  Ideas of Reference:None  Suicidal Thoughts:No Without Intent; Without Plan (Patient is able to contract for safety within the hospital.)  Homicidal Thoughts:No   Sensorium  Memory: Immediate Fair  Judgment: Fair  Insight: Fair   Chartered certified accountant: Fair  Attention  Span: Fair  Recall: [employer] of Knowledge: Fair  Language: Fair   Psychomotor Activity  Psychomotor Activity: Normal   Assets  Assets: Resilience   Sleep  Sleep: Poor  Number of hours:  8   Physical Exam: Physical Exam Vitals and nursing note reviewed.  Eyes:     Pupils: Pupils are equal, round, and reactive to light.  Neurological:     General: No focal deficit present.     Mental Status: She is oriented to person, place, and time.    Review of Systems  Psychiatric/Behavioral:  Positive for depression. Negative for hallucinations, memory loss, substance abuse and suicidal ideas. The patient is nervous/anxious and has insomnia.   All other systems reviewed and are negative.  Blood pressure (!) 142/92, pulse 77, temperature 98.4 F (36.9 C), temperature source Oral, resp. rate 18, SpO2 98%. There is no height or weight on file to calculate BMI.  Musculoskeletal: Strength & Muscle Tone: within normal limits Gait & Station: normal Patient leans: N/A   [facility] MSE Discharge Disposition for Follow up and Recommendations: Based on my evaluation the patient does not appear to  have an emergency medical condition and can be discharged with resources and follow up care in outpatient services for Medication Management and Individual Therapy   Donia Snell, NP 03/23/2024, 5:09 PM

## 2024-03-23 NOTE — ED Provider Notes (Signed)
  [place] EMERGENCY DEPARTMENT AT [facility] Provider Note   CSN: [id number] Arrival date & time: 03/23/24  2208     Patient presents with: Homeless   Lauren Poole is a 33 y.o. female with history of borderline personality disorder, bipolar 1 disorder, MDD.  Patient presents to ED for evaluation.  As I walk into the room, the patient goes I know, I know, I know.  All I need is my Seroquel  and a place to sleep for the night and I will leave in the morning.  She denies any medical complaints.  She is not responding to internal stimuli.  She denies SI, HI, AVH.  She was just psychiatrically cleared at [facility] 4 hours ago.  HPI     Prior to Admission medications   Medication Sig Start Date End Date Taking? Authorizing Provider  cholecalciferol  (CHOLECALCIFEROL ) 25 MCG tablet Take 1 tablet (1,000 Units total) by mouth daily. 03/08/24   Cleotilde Hoy [name], NP  QUEtiapine  (SEROQUEL ) 200 MG tablet Take 1 tablet (200 mg total) by mouth at bedtime for 3 days. 03/22/24 03/25/24  Mannie Pac T, DO    Allergies: Patient has no known allergies.    Review of Systems  All other systems reviewed and are negative.   Updated Vital Signs BP (!) 175/107 (BP Location: Left Arm)   Pulse 83   Temp 97.7 F (36.5 C) (Oral)   Resp 17   SpO2 99%   Physical Exam Vitals and nursing note reviewed.  Constitutional:      General: She is not in acute distress.    Appearance: She is not toxic-appearing.  HENT:     Head: Normocephalic and atraumatic.  Pulmonary:     Effort: No respiratory distress.  Skin:    Coloration: Skin is not jaundiced or pale.  Neurological:     Mental Status: She is alert and oriented to person, place, and time.  Psychiatric:        Behavior: Behavior normal.     (all labs ordered are listed, but only abnormal results are displayed) Labs Reviewed - No data to display  EKG: None  Radiology: DG Chest 2 View Result Date: 03/22/2024 CLINICAL  DATA:  Chest discomfort EXAM: CHEST - 2 VIEW COMPARISON:  None available. FINDINGS: Cardiomediastinal silhouette and pulmonary vasculature are within normal limits. Lungs are clear. Mild anterior wedging of the T7 and T8 vertebral bodies are of uncertain chronicity. IMPRESSION: No acute cardiopulmonary process. Electronically Signed   By: Aliene Lloyd M.D.   On: 03/22/2024 14:34     Procedures   Medications Ordered in the ED  QUEtiapine  (SEROQUEL ) tablet 200 mg (has no administration in time range)     Medical Decision Making Risk Prescription drug management.   Patient provided with shelter resource guide, given Seroquel  dose.  Discharged.    Final diagnoses:  Homeless    ED Discharge Orders     None          [name] 03/23/24 2228    Elnor Savant A, DO 03/23/24 2335

## 2024-03-23 NOTE — Discharge Instructions (Addendum)
 Follow-up with your PCP.  Report to [facility] for further care if indicated.

## 2024-03-23 NOTE — Discharge Instructions (Addendum)
 Based on the information that you have provided and the presenting issues outpatient services and resources for have been recommended.  It is imperative that you follow through with treatment recommendations within 5-7 days from the of discharge to mitigate further risk to your safety and mental well-being. A list of referrals has been provided below to get you started.  You are not limited to the list provided.  In case of an urgent crisis, you may contact the Mobile Crisis Unit with [facility] at [phone].   Patient has a follow up appointment on Friday  03-24-2024 at 12pm with [facility] for medication management and CST/ACTT services Address and Phone Number [address]  [place]  [phone]

## 2024-03-23 NOTE — ED Triage Notes (Signed)
 Pt. Arrives pov stating that she would like a bed to sleep in for the night and her Seroquel .
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
## 2024-03-24 ENCOUNTER — Emergency Department (HOSPITAL_COMMUNITY)
Admission: EM | Admit: 2024-03-24 | Discharge: 2024-03-24 | Disposition: A | Discharge status: Home / Self Care | Payer: MEDICAID | Attending: Emergency Medicine | Admitting: Emergency Medicine

## 2024-03-24 ENCOUNTER — Other Ambulatory Visit: Payer: Self-pay

## 2024-03-24 DIAGNOSIS — M25572 Pain in left ankle and joints of left foot: Secondary | ICD-10-CM | POA: Diagnosis present

## 2024-03-24 DIAGNOSIS — Z765 Malingerer [conscious simulation]: Secondary | ICD-10-CM | POA: Diagnosis not present

## 2024-03-24 DIAGNOSIS — R45851 Suicidal ideations: Secondary | ICD-10-CM | POA: Diagnosis not present

## 2024-03-24 NOTE — Discharge Instructions (Signed)
 Please follow-up with behavioral health urgent care.
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
## 2024-04-09 ENCOUNTER — Encounter: Payer: Self-pay | Admitting: Psychiatry

## 2024-04-09 ENCOUNTER — Encounter: Payer: Self-pay | Admitting: *Deleted

## 2024-04-09 ENCOUNTER — Emergency Department
Admission: EM | Admit: 2024-04-09 | Discharge: 2024-04-09 | Disposition: A | Discharge status: Other Acute Inpatient Hospital | Payer: MEDICAID | Attending: Emergency Medicine | Admitting: Emergency Medicine

## 2024-04-09 ENCOUNTER — Inpatient Hospital Stay
Admission: AD | Admit: 2024-04-09 | Discharge: 2024-04-16 | DRG: 885 | Disposition: A | Discharge status: Home / Self Care | Payer: MEDICAID | Source: Intra-hospital | Attending: Child & Adolescent Psychiatry | Admitting: Child & Adolescent Psychiatry

## 2024-04-09 ENCOUNTER — Other Ambulatory Visit: Payer: Self-pay

## 2024-04-09 DIAGNOSIS — F603 Borderline personality disorder: Secondary | ICD-10-CM | POA: Diagnosis not present

## 2024-04-09 DIAGNOSIS — Z79899 Other long term (current) drug therapy: Secondary | ICD-10-CM

## 2024-04-09 DIAGNOSIS — Z9152 Personal history of nonsuicidal self-harm: Secondary | ICD-10-CM

## 2024-04-09 DIAGNOSIS — R45851 Suicidal ideations: Secondary | ICD-10-CM | POA: Insufficient documentation

## 2024-04-09 DIAGNOSIS — Y92009 Unspecified place in unspecified non-institutional (private) residence as the place of occurrence of the external cause: Secondary | ICD-10-CM

## 2024-04-09 DIAGNOSIS — F17210 Nicotine dependence, cigarettes, uncomplicated: Secondary | ICD-10-CM | POA: Diagnosis present

## 2024-04-09 DIAGNOSIS — T43596A Underdosing of other antipsychotics and neuroleptics, initial encounter: Secondary | ICD-10-CM | POA: Diagnosis present

## 2024-04-09 DIAGNOSIS — F419 Anxiety disorder, unspecified: Secondary | ICD-10-CM | POA: Diagnosis present

## 2024-04-09 DIAGNOSIS — F315 Bipolar disorder, current episode depressed, severe, with psychotic features: Secondary | ICD-10-CM | POA: Diagnosis present

## 2024-04-09 DIAGNOSIS — Z560 Unemployment, unspecified: Secondary | ICD-10-CM | POA: Diagnosis not present

## 2024-04-09 DIAGNOSIS — Z59819 Housing instability, housed unspecified: Secondary | ICD-10-CM

## 2024-04-09 DIAGNOSIS — Z818 Family history of other mental and behavioral disorders: Secondary | ICD-10-CM

## 2024-04-09 DIAGNOSIS — F333 Major depressive disorder, recurrent, severe with psychotic symptoms: Secondary | ICD-10-CM | POA: Diagnosis present

## 2024-04-09 DIAGNOSIS — E559 Vitamin D deficiency, unspecified: Secondary | ICD-10-CM | POA: Diagnosis present

## 2024-04-09 DIAGNOSIS — Z91128 Patient's intentional underdosing of medication regimen for other reason: Secondary | ICD-10-CM | POA: Diagnosis not present

## 2024-04-09 DIAGNOSIS — F319 Bipolar disorder, unspecified: Secondary | ICD-10-CM | POA: Diagnosis present

## 2024-04-09 LAB — CBC
HCT: 40 % (ref 36.0–46.0)
Hemoglobin: 13.5 g/dL (ref 12.0–15.0)
MCH: 32 pg (ref 26.0–34.0)
MCHC: 33.8 g/dL (ref 30.0–36.0)
MCV: 94.8 fL (ref 80.0–100.0)
Platelets: 220 K/uL (ref 150–400)
RBC: 4.22 MIL/uL (ref 3.87–5.11)
RDW: 13.1 % (ref 11.5–15.5)
WBC: 6.3 K/uL (ref 4.0–10.5)
nRBC: 0 % (ref 0.0–0.2)

## 2024-04-09 LAB — COMPREHENSIVE METABOLIC PANEL WITH GFR
ALT: 26 U/L (ref 0–44)
AST: 24 U/L (ref 15–41)
Albumin: 4.4 g/dL (ref 3.5–5.0)
Alkaline Phosphatase: 71 U/L (ref 38–126)
Anion gap: 12 (ref 5–15)
BUN: 12 mg/dL (ref 6–20)
CO2: 20 mmol/L — ABNORMAL LOW (ref 22–32)
Calcium: 9.6 mg/dL (ref 8.9–10.3)
Chloride: 105 mmol/L (ref 98–111)
Creatinine, Ser: 0.78 mg/dL (ref 0.44–1.00)
GFR, Estimated: >60 mL/min (ref >60)
Glucose, Bld: 103 mg/dL — ABNORMAL HIGH (ref 70–99)
Potassium: 4.1 mmol/L (ref 3.5–5.1)
Sodium: 137 mmol/L (ref 135–145)
Total Bilirubin: 0.8 mg/dL (ref 0.0–1.2)
Total Protein: 7.9 g/dL (ref 6.5–8.1)

## 2024-04-09 LAB — URINE DRUG SCREEN, QUALITATIVE (ARMC ONLY)
Amphetamines, Ur Screen: NOT DETECTED
Barbiturates, Ur Screen: NOT DETECTED
Benzodiazepine, Ur Scrn: NOT DETECTED
Cannabinoid 50 Ng, Ur ~~LOC~~: NOT DETECTED
Cocaine Metabolite,Ur ~~LOC~~: NOT DETECTED
MDMA (Ecstasy)Ur Screen: NOT DETECTED
Methadone Scn, Ur: NOT DETECTED
Opiate, Ur Screen: NOT DETECTED
Phencyclidine (PCP) Ur S: NOT DETECTED
Tricyclic, Ur Screen: NOT DETECTED

## 2024-04-09 LAB — ETHANOL: Alcohol, Ethyl (B): <15 mg/dL (ref <15)

## 2024-04-09 LAB — POC URINE PREG, ED: Preg Test, Ur: NEGATIVE

## 2024-04-09 MED ORDER — TRAZODONE HCL 50 MG PO TABS: 50.0000 mg | ORAL_TABLET | Freq: Every evening | ORAL | Status: DC | PRN

## 2024-04-09 MED ORDER — QUETIAPINE FUMARATE ER 200 MG PO TB24: 200.0000 mg | ORAL_TABLET | Freq: Every day | ORAL | Status: DC

## 2024-04-09 MED ORDER — DIPHENHYDRAMINE HCL 25 MG PO CAPS: 50.0000 mg | ORAL_CAPSULE | Freq: Four times a day (QID) | ORAL | Status: DC | PRN

## 2024-04-09 MED ORDER — HYDROXYZINE HCL 25 MG PO TABS
25.0000 mg | ORAL_TABLET | Freq: Three times a day (TID) | ORAL | Status: DC | PRN
Start: 2024-04-09 — End: 2024-04-16

## 2024-04-09 MED ORDER — VITAMIN D 25 MCG (1000 UNIT) PO TABS
1000.0000 [IU] | ORAL_TABLET | Freq: Every day | ORAL | Status: DC
  Administered 2024-04-09 – 2024-04-16 (×7): 1000 [IU] via ORAL
  Filled 2024-04-09 (×8): qty 1

## 2024-04-09 MED ORDER — QUETIAPINE FUMARATE ER 200 MG PO TB24
200.0000 mg | ORAL_TABLET | Freq: Every day | ORAL | Status: DC
  Administered 2024-04-09 – 2024-04-13 (×5): 200 mg via ORAL
  Filled 2024-04-09 (×5): qty 1

## 2024-04-09 MED ORDER — LORAZEPAM 2 MG/ML IJ SOLN: 2.0000 mg | Freq: Three times a day (TID) | INTRAMUSCULAR | Status: DC | PRN

## 2024-04-09 MED ORDER — ALUM & MAG HYDROXIDE-SIMETH 200-200-20 MG/5ML PO SUSP: 30.0000 mL | ORAL | Status: DC | PRN

## 2024-04-09 MED ORDER — DIPHENHYDRAMINE HCL 50 MG/ML IJ SOLN: 50.0000 mg | Freq: Three times a day (TID) | INTRAMUSCULAR | Status: DC | PRN

## 2024-04-09 MED ORDER — HALOPERIDOL LACTATE 5 MG/ML IJ SOLN: 5.0000 mg | Freq: Three times a day (TID) | INTRAMUSCULAR | Status: DC | PRN

## 2024-04-09 MED ORDER — QUETIAPINE FUMARATE 25 MG PO TABS
100.0000 mg | ORAL_TABLET | Freq: Every day | ORAL | Status: AC
  Administered 2024-04-09: 100 mg via ORAL
  Filled 2024-04-09: qty 4

## 2024-04-09 MED ORDER — MAGNESIUM HYDROXIDE 400 MG/5ML PO SUSP: 30.0000 mL | Freq: Every day | ORAL | Status: DC | PRN

## 2024-04-09 MED ORDER — OLANZAPINE 5 MG PO TBDP: 5.0000 mg | ORAL_TABLET | Freq: Two times a day (BID) | ORAL | Status: DC | PRN

## 2024-04-09 MED ORDER — QUETIAPINE FUMARATE 200 MG PO TABS: 200.0000 mg | ORAL_TABLET | Freq: Every day | ORAL | Status: DC

## 2024-04-09 MED ORDER — ACETAMINOPHEN 325 MG PO TABS
650.0000 mg | ORAL_TABLET | Freq: Four times a day (QID) | ORAL | Status: DC | PRN
  Administered 2024-04-15: 650 mg via ORAL
  Filled 2024-04-09: qty 2

## 2024-04-09 NOTE — ED Notes (Signed)
Attempted to call report to BMU; no answer

## 2024-04-09 NOTE — ED Notes (Signed)
VOL/Pending psych consult 
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
## 2024-04-09 NOTE — [facility] Suicide Risk Assessment (Signed)
 [facility] INPATIENT:  Family/Significant Other Suicide Prevention Education  Suicide Prevention Education:  Patient Refusal for Family/Significant Other Suicide Prevention Education: The patient Lauren Poole has refused to provide written consent for family/significant other to be provided Family/Significant Other Suicide Prevention Education during admission and/or prior to discharge.  Physician notified.  SPE completed with pt, as pt refused to consent to family contact. SPI pamphlet provided to pt and pt was encouraged to share information with support network, ask questions, and talk about any concerns relating to SPE. Pt denies access to guns/firearms and verbalized understanding of information provided. Mobile Crisis information also provided to pt.    Marcelles Clinard M Aireonna Bauer 04/09/2024, 4:35 PM

## 2024-04-09 NOTE — Group Note (Signed)
 Date:  04/09/2024 Time:  8:34 PM  Group Topic/Focus:  Coping With Mental Health Crisis:   The purpose of this group is to help patients identify strategies for coping with mental health crisis.  Group discusses possible causes of crisis and ways to manage them effectively. Wrap-Up Group:   The focus of this group is to help patients review their daily goal of treatment and discuss progress on daily workbooks.    Participation Level:  Active  Participation Quality:  Appropriate and Attentive  Affect:  Appropriate  Cognitive:  Alert and Appropriate  Insight: Appropriate and Good  Engagement in Group:  Engaged  Modes of Intervention:  Activity  Additional Comments:     Arlester CHRISTELLA Servant 04/09/2024, 8:34 PM

## 2024-04-09 NOTE — BH Assessment (Addendum)
 Patient is to be admitted to [facility] on today 04/09/24 by Dr. Jadapalle.  Attending Physician will be Dr. Jadapalle.   Patient has been assigned to room # 303 by Arland Oas Nurse.      ER staff is aware of the admission: Ronnie, ER Secretary   Dr. Claudene, ER MD  [place], Patient's Nurse
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
## 2024-04-09 NOTE — ED Notes (Signed)
 Pt belongings taken out of [facility] storage to take with patient to [facility].  1 pair pink crocs, Black shorts Black underwear Elnor sweatpants,  [employer] wallet with cards White headphones with white case

## 2024-04-09 NOTE — Progress Notes (Signed)
 Pt admitted to BMU and was calm and cooperative on assessment.  Pt denied current SI HI AVH however endorsed having had some SI and HI prior to admission but with no plan.  Pt expressed frustration with finances and living situation describing herself as homeless but stating that she receives transportation from her mom and also sometimes stays overnight and showers at her mom's home.  Pt denied any medical issue but endorsed a dx of bipolar I. She denied any pain or physical complaints at the time of assessment.  Pt was oriented to the unit and provided a meal tray.  Pt rested in her room during the afternoon.  Pt requested her scheduled vitamin D  be delayed until later so she could rest.  Pharmacy updated med due time.  Pt on Q15 min checks.    04/09/24 1800  Psych Admission Type (Psych Patients Only)  Admission Status Voluntary  Psychosocial Assessment  Patient Complaints None  Eye Contact Fair  Facial Expression Animated  Affect Preoccupied  Speech Logical/coherent  Interaction Assertive  Motor Activity Slow  Appearance/Hygiene Disheveled  Behavior Characteristics Cooperative  Mood Pleasant  Thought Process  Coherency WDL  Content WDL  Delusions None reported or observed  Perception WDL  Hallucination None reported or observed  Judgment Impaired  Confusion WDL  Danger to Self  Current suicidal ideation? Denies  Agreement Not to Harm Self Yes  Description of Agreement verbal  Danger to Others  Danger to Others None reported or observed

## 2024-04-09 NOTE — Group Note (Unsigned)
 Date:  04/09/2024 Time:  8:32 PM  Group Topic/Focus:  Overcoming Stress:   The focus of this group is to define stress and help patients assess their triggers. Wrap-Up Group:   The focus of this group is to help patients review their daily goal of treatment and discuss progress on daily workbooks.     Participation Level:  {B[facility] PARTICIPATION OZCZO:77735}  Participation Quality:  {[facility] PARTICIPATION QUALITY:22265}  Affect:  {BHH AFFECT:22266}  Cognitive:  {B[facility] COGNITIVE:22267}  Insight: {BHH Insight2:20797}  Engagement in Group:  {B[facility] ENGAGEMENT IN HMNLE:77731}  Modes of Intervention:  {B[facility] MODES OF INTERVENTION:22269}  Additional Comments:  ***  Lauren Poole 04/09/2024, 8:32 PM

## 2024-04-09 NOTE — ED Notes (Signed)
 Lunch tray provided to pt.

## 2024-04-09 NOTE — ED Notes (Signed)
 Pt requesting Seroquel  200mg , EDP Wong notified.

## 2024-04-09 NOTE — Plan of Care (Signed)
  Problem: Education: Goal: Knowledge of [facility] Education information/materials will improve 04/09/2024 1526 by Trudy Arland CROME, RN Outcome: Progressing 04/09/2024 1526 by Trudy Arland CROME, RN Outcome: Not Progressing   Problem: Education: Goal: Verbalization of understanding the information provided will improve 04/09/2024 1526 by Trudy Arland CROME, RN Outcome: Progressing 04/09/2024 1526 by Trudy Arland CROME, RN Outcome: Not Progressing
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
## 2024-04-09 NOTE — Group Note (Signed)
 Date:  04/09/2024 Time:  3:32 PM  Group Topic/Focus:  Rediscovering Joy:   The focus of this group is to explore various ways to relieve stress in a positive manner.    Participation Level:  Did Not Attend  Lauren Poole Hover 04/09/2024, 3:32 PM
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
## 2024-04-10 DIAGNOSIS — F319 Bipolar disorder, unspecified: Secondary | ICD-10-CM | POA: Diagnosis not present

## 2024-04-10 NOTE — Plan of Care (Signed)
  Problem: Education: Goal: Mental status will improve Outcome: Progressing Goal: Verbalization of understanding the information provided will improve Outcome: Progressing   Problem: Health Behavior/Discharge Planning: Goal: Identification of resources available to assist in meeting health care needs will improve Outcome: Progressing   Problem: Safety: Goal: Periods of time without injury will increase Outcome: Progressing   Problem: Education: Goal: Ability to state activities that reduce stress will improve Outcome: Progressing   Problem: Coping: Goal: Ability to identify and develop effective coping behavior will improve Outcome: Progressing   Problem: Self-Concept: Goal: Ability to identify factors that promote anxiety will improve Outcome: Progressing

## 2024-04-10 NOTE — Group Note (Signed)
 Date:  04/10/2024 Time:  8:34 PM  Group Topic/Focus:  Wrap-Up Group:   The focus of this group is to help patients review their daily goal of treatment and discuss progress on daily workbooks.    Participation Level:  Active  Participation Quality:  Appropriate  Affect:  Appropriate  Cognitive:  Appropriate  Insight: Appropriate  Engagement in Group:  Engaged  Modes of Intervention:  Discussion and Education  Additional Comments:    Deitra Caron Mainland 04/10/2024, 8:34 PM

## 2024-04-10 NOTE — BHH Counselor (Signed)
 CSW discussed with the patient being placed on waitlist for shelters.   CSW assisted patient in getting added to waitlist for shelters in [facility].   CSW discussed with the patient [employer]. However, pt is not an [facility] resident.   CSW discussed with the patient the [facility].  Patient was willing to be discharged there if no other options available.   CSW discussed with the patient [facility] in [place].  Patient asked that bed attempt be made at day of discharge.   Sherryle Margo, MSW, LCSW 04/10/2024 3:33 PM

## 2024-04-10 NOTE — Progress Notes (Signed)
 Pt in bed at the start of shift but later came out when encouraged to. Continues to endorse depression rated 7/10 adding, I want to be content with people.  Pt denied SI/HI and AVH.   04/09/24 2100  Psych Admission Type (Psych Patients Only)  Admission Status Voluntary  Psychosocial Assessment  Patient Complaints None  Eye Contact Fair  Facial Expression Animated  Affect Preoccupied  Speech Logical/coherent  Interaction Assertive  Motor Activity Slow  Appearance/Hygiene Disheveled  Behavior Characteristics Cooperative;Appropriate to situation;Anxious  Mood Depressed;Anxious  Thought Process  Coherency WDL  Content WDL  Delusions None reported or observed  Perception WDL  Hallucination None reported or observed  Judgment Limited  Confusion WDL  Danger to Self  Current suicidal ideation? Denies  Agreement Not to Harm Self Yes  Description of Agreement Verbal  Danger to Others  Danger to Others None reported or observed    Problem: Education: Goal: Knowledge of [place] [facility] Education information/materials will improve Outcome: Progressing Goal: Emotional status will improve Outcome: Progressing Goal: Mental status will improve Outcome: Progressing Goal: Verbalization of understanding the information provided will improve Outcome: Progressing   Problem: Activity: Goal: Interest or engagement in activities will improve Outcome: Progressing Goal: Sleeping patterns will improve Outcome: Progressing   Problem: Coping: Goal: Ability to verbalize frustrations and anger appropriately will improve Outcome: Progressing Goal: Ability to demonstrate self-control will improve Outcome: Progressing
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
## 2024-04-10 NOTE — Group Note (Signed)
 LCSW Group Therapy Note   Group Date: 04/10/2024 Start Time: 1300 End Time: 1400   Type of Therapy and Topic:  Group Therapy: Challenging Core Beliefs  Participation Level:  Did Not Attend  Description of Group:  Patients were educated about core beliefs and asked to identify one harmful core belief that they have. Patients were asked to explore from where those beliefs originate. Patients were asked to discuss how those beliefs make them feel and the resulting behaviors of those beliefs. They were then be asked if those beliefs are true and, if so, what evidence they have to support them. Lastly, group members were challenged to replace those negative core beliefs with helpful beliefs.   Therapeutic Goals:   1. Patient will identify harmful core beliefs and explore the origins of such beliefs. 2. Patient will identify feelings and behaviors that result from those core beliefs. 3. Patient will discuss whether such beliefs are true. 4.  Patient will replace harmful core beliefs with helpful ones.  Summary of Patient Progress:  Patient did not attend.   Therapeutic Modalities: Cognitive Behavioral Therapy; Solution-Focused Therapy   [facility] M Kendallyn Lippold, LCSWA 04/10/2024  2:06 PM

## 2024-04-10 NOTE — Group Note (Signed)
 Date:  04/10/2024 Time:  10:33 AM  Group Topic/Focus:  Goals Group:   The focus of this group is to help patients establish daily goals to achieve during treatment and discuss how the patient can incorporate goal setting into their daily lives to aide in recovery.    Participation Level:  Active  Participation Quality:  Appropriate  Affect:  Appropriate  Cognitive:  Alert  Insight: Appropriate  Engagement in Group:  Engaged  Modes of Intervention:  Activity, Discussion, and Education  Additional Comments:    Lauren Poole Bennett 04/10/2024, 10:33 AM

## 2024-04-10 NOTE — Group Note (Signed)
 Recreation Therapy Group Note   Group Topic:Coping Skills  Group Date: 04/10/2024 Start Time: 1535 End Time: 1610 Facilitators: Celestia Jeoffrey BRAVO, LRT, CTRS Location: Craft Room  Group Description: Mind Map.  Patient was provided a blank template of a diagram with 32 blank boxes in a tiered system, branching from the center (similar to a bubble chart). LRT directed patients to label the middle of the diagram Coping Skills. LRT and patients then came up with 8 different coping skills as examples. Pt were directed to record their coping skills in the 2nd tier boxes closest to the center.  Patients would then share their coping skills with the group as LRT wrote them out. LRT gave a handout of 99 different coping skills at the end of group.   Goal Area(s) Addressed: Patients will be able to define "coping skills". Patient will identify new coping skills.  Patient will increase communication.   Affect/Mood: Appropriate and Flat   Participation Level: Active and Engaged   Participation Quality: Independent   Behavior: Calm and Cooperative   Speech/Thought Process: Coherent   Insight: Fair   Judgement: Fair    Modes of Intervention: Education, Exploration, Worksheet, and Writing   Patient Response to Interventions:  Engaged and Receptive   Education Outcome:  Acknowledges education   Clinical Observations/Individualized Feedback: Lauren Poole was active in their participation of session activities and group discussion. Pt identified being in silence and french fries as coping skills.    Plan: Continue to engage patient in RT group sessions 2-3x/week.   Jeoffrey BRAVO Celestia, LRT, CTRS 04/10/2024 5:43 PM

## 2024-04-10 NOTE — Progress Notes (Signed)
 Pt calm and pleasant during assessment denying SI/HI/AVH. Pt observed by this Clinical research associate interacting appropriately with staff and peers on the unit. Pt compliant with medication administration per MD orders. Pt given education, support, and encouragement to be active in her treatment plan. Pt being monitored Q 15 minutes for safety per unit protocol, remains safe on the unit

## 2024-04-10 NOTE — Progress Notes (Signed)
   04/10/24 0900  Psych Admission Type (Psych Patients Only)  Admission Status Voluntary  Psychosocial Assessment  Patient Complaints None  Eye Contact Fair  Facial Expression Animated  Affect Preoccupied  Speech Logical/coherent  Interaction Assertive  Motor Activity Slow  Appearance/Hygiene Disheveled  Behavior Characteristics Cooperative;Appropriate to situation  Mood Depressed  Thought Process  Coherency WDL  Content WDL  Delusions None reported or observed  Perception WDL  Hallucination None reported or observed  Judgment Impaired  Confusion None  Danger to Self  Current suicidal ideation? Denies  Agreement Not to Harm Self Yes  Description of Agreement verbal  Danger to Others  Danger to Others None reported or observed   Patient isolates to her room most of the shift. Patient states  I don't know know where I will be going from here. I applied for many jobs. No one called me. I don't know its personal or they don't need anyone. I am optimistic. ADLs maintained. Support and encouragement given.

## 2024-04-10 NOTE — Group Note (Signed)
 Recreation Therapy Group Note   Group Topic:General Recreation  Group Date: 04/10/2024 Start Time: 1040 End Time: 1140 Facilitators: Celestia Jeoffrey BRAVO, LRT, CTRS Location: Courtyard  Group Description: [employer]. LRT and patients played games of basketball, drew with chalk, and played corn hole while outside in the courtyard while getting fresh air and sunlight. Music was being played in the background. LRT and peers conversed about different games they have played before, what they do in their free time and anything else that is on their minds. LRT encouraged pts to drink water after being outside, sweating and getting their heart rate up.  Goal Area(s) Addressed: Patient will build on frustration tolerance skills. Patients will partake in a competitive play game with peers. Patients will gain knowledge of new leisure interest/hobby.    Affect/Mood: N/A   Participation Level: Did not attend    Clinical Observations/Individualized Feedback: Patient did not attend group.   Plan: Continue to engage patient in RT group sessions 2-3x/week.   [address] LRT, CTRS 04/10/2024 1:26 PM

## 2024-04-10 NOTE — BH IP Treatment Plan (Signed)
 Interdisciplinary Treatment and Diagnostic Plan Update  04/10/2024 Time of Session: 10:49AM Lauren Poole MRN: [id number]  Principal Diagnosis: Bipolar 1 disorder (HCC)  Secondary Diagnoses: Principal Problem:   Bipolar 1 disorder (HCC)   Current Medications:  Current Facility-Administered Medications  Medication Dose Route Frequency Provider Last Rate Last Admin   acetaminophen  (TYLENOL ) tablet 650 mg  650 mg Oral Q6H PRN Hampton, Tracie B, NP       alum & mag hydroxide-simeth (MAALOX/MYLANTA) 200-200-20 MG/5ML suspension 30 mL  30 mL Oral Q4H PRN Hampton, Tracie B, NP       cholecalciferol  (VITAMIN D3) 25 MCG (1000 UNIT) tablet 1,000 Units  1,000 Units Oral Daily Hampton, Tracie B, NP   1,000 Units at 04/10/24 9182   diphenhydrAMINE  (BENADRYL ) capsule 50 mg  50 mg Oral Q6H PRN Hampton, Tracie B, NP       haloperidol  lactate (HALDOL ) injection 5 mg  5 mg Intramuscular TID PRN Hampton, Tracie B, NP       And   diphenhydrAMINE  (BENADRYL ) injection 50 mg  50 mg Intramuscular TID PRN Hampton, Tracie B, NP       And   LORazepam  (ATIVAN ) injection 2 mg  2 mg Intramuscular TID PRN Hampton, Tracie B, NP       hydrOXYzine  (ATARAX ) tablet 25 mg  25 mg Oral TID PRN Hampton, Tracie B, NP       magnesium  hydroxide (MILK OF MAGNESIA) suspension 30 mL  30 mL Oral Daily PRN Hampton, Tracie B, NP       OLANZapine  zydis (ZYPREXA ) disintegrating tablet 5 mg  5 mg Oral BID PRN Hampton, Tracie B, NP       QUEtiapine  (SEROQUEL  XR) 24 hr tablet 200 mg  200 mg Oral QHS Hampton, Tracie B, NP   200 mg at 04/09/24 2103   traZODone  (DESYREL ) tablet 50 mg  50 mg Oral QHS PRN Hampton, Tracie B, NP       PTA Medications: Medications Prior to Admission  Medication Sig Dispense Refill Last Dose/Taking   cholecalciferol  (CHOLECALCIFEROL ) 25 MCG tablet Take 1 tablet (1,000 Units total) by mouth daily. 30 tablet 0    QUEtiapine  (SEROQUEL ) 200 MG tablet Take 1 tablet (200 mg total) by mouth at bedtime for 3  days. 3 tablet 0     Patient Stressors:    Patient Strengths:    Treatment Modalities: Medication Management, Group therapy, Case management,  1 to 1 session with clinician, Psychoeducation, Recreational therapy.   Physician Treatment Plan for Primary Diagnosis: Bipolar 1 disorder (HCC) Long Term Goal(s):     Short Term Goals:    Medication Management: Evaluate patient's response, side effects, and tolerance of medication regimen.  Therapeutic Interventions: 1 to 1 sessions, Unit Group sessions and Medication administration.  Evaluation of Outcomes: Not Met  Physician Treatment Plan for Secondary Diagnosis: Principal Problem:   Bipolar 1 disorder (HCC)  Long Term Goal(s):     Short Term Goals:       Medication Management: Evaluate patient's response, side effects, and tolerance of medication regimen.  Therapeutic Interventions: 1 to 1 sessions, Unit Group sessions and Medication administration.  Evaluation of Outcomes: Not Met   RN Treatment Plan for Primary Diagnosis: Bipolar 1 disorder (HCC) Long Term Goal(s): Knowledge of disease and therapeutic regimen to maintain health will improve  Short Term Goals: Ability to demonstrate self-control, Ability to participate in decision making will improve, Ability to verbalize feelings will improve, Ability to disclose and discuss suicidal ideas, Ability to  identify and develop effective coping behaviors will improve, and Compliance with prescribed medications will improve  Medication Management: RN will administer medications as ordered by provider, will assess and evaluate patient's response and provide education to patient for prescribed medication. RN will report any adverse and/or side effects to prescribing provider.  Therapeutic Interventions: 1 on 1 counseling sessions, Psychoeducation, Medication administration, Evaluate responses to treatment, Monitor vital signs and CBGs as ordered, Perform/monitor CIWA, COWS, AIMS and  Fall Risk screenings as ordered, Perform wound care treatments as ordered.  Evaluation of Outcomes: Not Met   LCSW Treatment Plan for Primary Diagnosis: Bipolar 1 disorder (HCC) Long Term Goal(s): Safe transition to appropriate next level of care at discharge, Engage patient in therapeutic group addressing interpersonal concerns.  Short Term Goals: Engage patient in aftercare planning with referrals and resources, Increase social support, Increase ability to appropriately verbalize feelings, Increase emotional regulation, Facilitate acceptance of mental health diagnosis and concerns, and Increase skills for wellness and recovery  Therapeutic Interventions: Assess for all discharge needs, 1 to 1 time with Social worker, Explore available resources and support systems, Assess for adequacy in community support network, Educate family and significant other(s) on suicide prevention, Complete Psychosocial Assessment, Interpersonal group therapy.  Evaluation of Outcomes: Progressing   Progress in Treatment: Attending groups: Yes. Participating in groups: Yes. Taking medication as prescribed: Yes. Toleration medication: Yes. Family/Significant other contact made: No, will contact:  once permission has been granted Patient understands diagnosis: Yes. Discussing patient identified problems/goals with staff: Yes. Medical problems stabilized or resolved: Yes. Denies suicidal/homicidal ideation: Yes. Issues/concerns per patient self-inventory: No. Other: none  New problem(s) identified: No, Describe:  none  New Short Term/Long Term Goal(s): detox, elimination of symptoms of psychosis, medication management for mood stabilization; elimination of SI thoughts; development of comprehensive mental wellness/sobriety plan.   Patient Goals:  feel better than the other day, I'm jut fed up with humanity  Discharge Plan or Barriers: CSW to assist in the development of appropriate discharge plans.     Reason for Continuation of Hospitalization: Anxiety Depression Medication stabilization Suicidal ideation  Estimated Length of Stay:  1-7 days  Last 3 [place] Suicide Severity Risk Score: Flowsheet Row Admission (Current) from 04/09/2024 in [facility] INPATIENT BEHAVIORAL MEDICINE Most recent reading at 04/09/2024  6:00 PM ED from 04/09/2024 in [facility] Emergency Department at [facility] Most recent reading at 04/09/2024  2:10 AM ED from 03/24/2024 in [facility] Emergency Department at [facility] Most recent reading at 03/24/2024 12:43 AM  C-SSRS RISK CATEGORY Low Risk High Risk High Risk    Last PHQ 2/9 Scores:     No data to display          Scribe for Treatment Team: Sherryle JINNY Margo, LCSW 04/10/2024 11:01 AM
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
## 2024-04-11 DIAGNOSIS — F319 Bipolar disorder, unspecified: Secondary | ICD-10-CM | POA: Diagnosis not present

## 2024-04-11 NOTE — Group Note (Unsigned)
 Date:  04/11/2024 Time:  8:23 PM  Group Topic/Focus:  Emotional Education:   The focus of this group is to discuss what feelings/emotions are, and how they are experienced.     Participation Level:  {BHH PARTICIPATION OZCZO:77735}  Participation Quality:  {BHH PARTICIPATION QUALITY:22265}  Affect:  {BHH AFFECT:22266}  Cognitive:  {BHH COGNITIVE:22267}  Insight: {BHH Insight2:20797}  Engagement in Group:  {BHH ENGAGEMENT IN HMNLE:77731}  Modes of Intervention:  {BHH MODES OF INTERVENTION:22269}  Additional Comments:  ***  Lauren Poole Penton 04/11/2024, 8:23 PM

## 2024-04-11 NOTE — Group Note (Signed)
 Date:  04/11/2024 Time:  8:34 PM  Group Topic/Focus:  Overcoming Stress:   The focus of this group is to define stress and help patients assess their triggers.    Participation Level:  Active  Participation Quality:  Appropriate  Affect:  Appropriate  Cognitive:  Appropriate  Insight: Appropriate  Engagement in Group:  Developing/Improving  Modes of Intervention:  Discussion  Additional Comments:   Jacqualyn FORBES Penton 04/11/2024, 8:34 PM

## 2024-04-11 NOTE — Plan of Care (Signed)
   Problem: Education: Goal: Emotional status will improve Outcome: Progressing Goal: Mental status will improve Outcome: Progressing

## 2024-04-11 NOTE — Group Note (Signed)
 Recreation Therapy Group Note   Group Topic:Relaxation  Group Date: 04/11/2024 Start Time: 1530 End Time: 1630 Facilitators: Celestia Jeoffrey BRAVO, LRT, CTRS Location: Courtyard  Group Description: Meditation. LRT and patients discussed what they know about meditation and mindfulness. LRT played a Deep Breathing Meditation exercise script for patients to follow along to. LRT and patients discussed how meditation and deep breathing can be used as a coping skill post--discharge to help manage symptoms of stress.   Goal Area(s) Addressed: Patient will practice using relaxation technique. Patient will identify a new coping skill.  Patient will follow multistep directions to reduce anxiety and stress.   Affect/Mood: N/A   Participation Level: Did not attend    Clinical Observations/Individualized Feedback: Patient did not attend group.   Plan: Continue to engage patient in RT group sessions 2-3x/week.   Jeoffrey BRAVO Celestia, LRT, CTRS 04/11/2024 5:30 PM

## 2024-04-11 NOTE — Group Note (Signed)
 Recreation Therapy Group Note   Group Topic:Health and Wellness  Group Date: 04/11/2024 Start Time: 1000 End Time: 1130 Facilitators: Celestia Jeoffrey BRAVO, LRT, CTRS Location: Courtyard  Group Description: [employer]. LRT and patients played games of basketball, drew with chalk, and played corn hole while outside in the courtyard while getting fresh air and sunlight. Music was being played in the background. LRT and peers conversed about different games they have played before, what they do in their free time and anything else that is on their minds. LRT encouraged pts to drink water after being outside, sweating and getting their heart rate up.  Goal Area(s) Addressed: Patient will build on frustration tolerance skills. Patients will partake in a competitive play game with peers. Patients will gain knowledge of new leisure interest/hobby.    Affect/Mood: Appropriate   Participation Level: Active   Participation Quality: Independent   Behavior: Appropriate   Speech/Thought Process: Coherent   Insight: Fair   Judgement: Fair    Modes of Intervention: Activity   Patient Response to Interventions:  Receptive   Education Outcome:  Acknowledges education   Clinical Observations/Individualized Feedback: Willam was active in their participation of session activities and group discussion. Pt interacted well with LRT and peers duration of session.    Plan: Continue to engage patient in RT group sessions 2-3x/week.   Jeoffrey BRAVO Celestia, LRT, CTRS 04/11/2024 11:53 AM

## 2024-04-11 NOTE — Progress Notes (Signed)
   04/11/24 0900  Psych Admission Type (Psych Patients Only)  Admission Status Voluntary  Psychosocial Assessment  Patient Complaints None  Eye Contact Fair  Facial Expression Animated  Affect Preoccupied  Speech Logical/coherent  Interaction Assertive  Motor Activity Slow  Appearance/Hygiene Unremarkable  Behavior Characteristics Cooperative  Mood Pleasant  Thought Process  Coherency WDL  Content WDL  Delusions None reported or observed  Perception WDL  Hallucination None reported or observed  Judgment Impaired  Confusion None  Danger to Self  Current suicidal ideation? Denies  Agreement Not to Harm Self Yes  Description of Agreement verbal  Danger to Others  Danger to Others None reported or observed

## 2024-04-11 NOTE — Group Note (Signed)
 LCSW Group Therapy Note  Group Date: 04/11/2024 Start Time: 1300 End Time: 1400   Type of Therapy and Topic:  Group Therapy: Anger Cues and Responses  Participation Level:  Did Not Attend   Description of Group:   In this group, patients learned how to recognize the physical, cognitive, emotional, and behavioral responses they have to anger-provoking situations.  They identified a recent time they became angry and how they reacted.  They analyzed how their reaction was possibly beneficial and how it was possibly unhelpful.  The group discussed a variety of healthier coping skills that could help with such a situation in the future.  Focus was placed on how helpful it is to recognize the underlying emotions to our anger, because working on those can lead to a more permanent solution as well as our ability to focus on the important rather than the urgent.  Therapeutic Goals: Patients will remember their last incident of anger and how they felt emotionally and physically, what their thoughts were at the time, and how they behaved. Patients will identify how their behavior at that time worked for them, as well as how it worked against them. Patients will explore possible new behaviors to use in future anger situations. Patients will learn that anger itself is normal and cannot be eliminated, and that healthier reactions can assist with resolving conflict rather than worsening situations.  Summary of Patient Progress:  X  Therapeutic Modalities:   Cognitive Behavioral Therapy    Sherryle JINNY Margo, LCSW 04/11/2024  3:30 PM

## 2024-04-11 NOTE — Plan of Care (Signed)
  Problem: Education: Goal: Emotional status will improve Outcome: Progressing Goal: Mental status will improve Outcome: Progressing Goal: Verbalization of understanding the information provided will improve Outcome: Progressing   Problem: Health Behavior/Discharge Planning: Goal: Compliance with treatment plan for underlying cause of condition will improve Outcome: Progressing   Problem: Physical Regulation: Goal: Ability to maintain clinical measurements within normal limits will improve Outcome: Progressing   Problem: Safety: Goal: Periods of time without injury will increase Outcome: Progressing

## 2024-04-11 NOTE — BHH Counselor (Signed)
 CSW met with the patient on collateral contact.  Patient DECLINED.  Sherryle Margo, MSW, LCSW 04/11/2024 4:18 PM

## 2024-04-11 NOTE — Plan of Care (Signed)
   Problem: Education: Goal: Emotional status will improve Outcome: Progressing Goal: Mental status will improve Outcome: Progressing Goal: Verbalization of understanding the information provided will improve Outcome: Progressing

## 2024-04-11 NOTE — Group Note (Signed)
 Date:  04/11/2024 Time:  5:46 PM   Additional Comments:  Did not attend group.  Butler LITTIE Gelineau 04/11/2024, 5:46 PM
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
## 2024-04-12 DIAGNOSIS — F319 Bipolar disorder, unspecified: Secondary | ICD-10-CM | POA: Diagnosis not present

## 2024-04-12 NOTE — Plan of Care (Signed)
  Problem: Education: Goal: Knowledge of [facility] Education information/materials will improve Outcome: Progressing Goal: Emotional status will improve Outcome: Progressing Goal: Mental status will improve Outcome: Progressing Goal: Verbalization of understanding the information provided will improve Outcome: Progressing   Problem: Activity: Goal: Interest or engagement in activities will improve Outcome: Progressing Goal: Sleeping patterns will improve Outcome: Progressing   Problem: Coping: Goal: Ability to verbalize frustrations and anger appropriately will improve Outcome: Progressing Goal: Ability to demonstrate self-control will improve Outcome: Progressing   Problem: Health Behavior/Discharge Planning: Goal: Identification of resources available to assist in meeting health care needs will improve Outcome: Progressing Goal: Compliance with treatment plan for underlying cause of condition will improve Outcome: Progressing   Problem: Physical Regulation: Goal: Ability to maintain clinical measurements within normal limits will improve Outcome: Progressing   Problem: Safety: Goal: Periods of time without injury will increase Outcome: Progressing   Problem: Education: Goal: Ability to state activities that reduce stress will improve Outcome: Progressing   Problem: Coping: Goal: Ability to identify and develop effective coping behavior will improve Outcome: Progressing   Problem: Self-Concept: Goal: Ability to identify factors that promote anxiety will improve Outcome: Progressing Goal: Level of anxiety will decrease Outcome: Progressing Goal: Ability to modify response to factors that promote anxiety will improve Outcome: Progressing   Problem: Education: Goal: Utilization of techniques to improve thought processes will improve Outcome: Progressing Goal: Knowledge of the prescribed therapeutic regimen will improve Outcome: Progressing   Problem:  Activity: Goal: Interest or engagement in leisure activities will improve Outcome: Progressing Goal: Imbalance in normal sleep/wake cycle will improve Outcome: Progressing   Problem: Coping: Goal: Coping ability will improve Outcome: Progressing Goal: Will verbalize feelings Outcome: Progressing   Problem: Health Behavior/Discharge Planning: Goal: Ability to make decisions will improve Outcome: Progressing Goal: Compliance with therapeutic regimen will improve Outcome: Progressing   Problem: Role Relationship: Goal: Will demonstrate positive changes in social behaviors and relationships Outcome: Progressing   Problem: Safety: Goal: Ability to disclose and discuss suicidal ideas will improve Outcome: Progressing Goal: Ability to identify and utilize support systems that promote safety will improve Outcome: Progressing   Problem: Self-Concept: Goal: Will verbalize positive feelings about self Outcome: Progressing Goal: Level of anxiety will decrease Outcome: Progressing   Problem: Education: Goal: Ability to make informed decisions regarding treatment will improve Outcome: Progressing   Problem: Coping: Goal: Coping ability will improve Outcome: Progressing   Problem: Health Behavior/Discharge Planning: Goal: Identification of resources available to assist in meeting health care needs will improve Outcome: Progressing   Problem: Medication: Goal: Compliance with prescribed medication regimen will improve Outcome: Progressing   Problem: Self-Concept: Goal: Ability to disclose and discuss suicidal ideas will improve Outcome: Progressing Goal: Will verbalize positive feelings about self Outcome: Progressing Note: Patient is on track. Patient will work on increased adherence

## 2024-04-12 NOTE — Progress Notes (Signed)
   04/12/24 1957  Psych Admission Type (Psych Patients Only)  Admission Status Voluntary  Psychosocial Assessment  Patient Complaints Depression  Eye Contact Fair  Facial Expression Animated  Affect Preoccupied  Speech Logical/coherent  Interaction Assertive  Motor Activity Slow  Appearance/Hygiene Unremarkable  Behavior Characteristics Cooperative;Appropriate to situation  Mood Pleasant  Aggressive Behavior  Effect No apparent injury  Thought Process  Coherency WDL  Content WDL  Delusions None reported or observed  Perception WDL  Hallucination None reported or observed  Judgment Impaired  Confusion None  Danger to Self  Current suicidal ideation? Denies  Agreement Not to Harm Self Yes  Description of Agreement verbal  Danger to Others  Danger to Others None reported or observed

## 2024-04-12 NOTE — Plan of Care (Signed)
  Problem: Education: Goal: Emotional status will improve Outcome: Progressing Goal: Verbalization of understanding the information provided will improve Outcome: Progressing   Problem: Activity: Goal: Interest or engagement in activities will improve Outcome: Progressing   
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
## 2024-04-12 NOTE — [facility] Group Notes (Signed)
 Adult Psychoeducational Group Note  Date:  04/12/2024 Time:  8:35 PM  Group Topic/Focus:  Wrap-Up Group:   The focus of this group is to help patients review their daily goal of treatment and discuss progress on daily workbooks.  Participation Level:  Active  Participation Quality:  Appropriate  Affect:  Appropriate  Cognitive:  Appropriate and Oriented  Insight: Good  Engagement in Group:  Engaged  Modes of Intervention:  Discussion, Socialization, and Support  Additional Comments:  Pt attended and engaged in wrap up group. Pt goal for today was to get rest. Something positive that happened today was she enjoyed some free time in her room (singing, journaling, etc.) Pt rated her day a 7/10.  Lauren Poole MALVA Pepper 04/12/2024, 8:35 PM

## 2024-04-12 NOTE — Progress Notes (Signed)
   04/12/24 0900  Psych Admission Type (Psych Patients Only)  Admission Status Voluntary  Psychosocial Assessment  Patient Complaints Depression (patient states lack of resources and necessities; humans need things. I don't know why I don't have anything, it's an odd story.)  Eye Contact Fair;Watchful  Facial Expression Worried  Affect Preoccupied  Speech Logical/coherent  Interaction Assertive  Motor Activity Slow  Appearance/Hygiene In scrubs  Behavior Characteristics Cooperative;Appropriate to situation  Mood Pleasant  Aggressive Behavior  Effect No apparent injury  Thought Process  Coherency WDL  Content WDL  Delusions None reported or observed  Perception WDL  Hallucination None reported or observed  Judgment WDL  Confusion None  Danger to Self  Current suicidal ideation? Denies (patient states not today.)  Agreement Not to Harm Self Yes  Description of Agreement Verbal  Danger to Others  Danger to Others None reported or observed   Patient's goal for today, per her self-inventory is working on discharge.

## 2024-04-12 NOTE — Group Note (Signed)
 Date:  04/12/2024 Time:  5:49 PM  Group Topic/Focus:  Wellness Toolbox:   The focus of this group is to discuss various aspects of wellness, balancing those aspects and exploring ways to increase the ability to experience wellness.  Patients will create a wellness toolbox for use upon discharge.    Participation Level:  Did Not Attend   Deitra Clap [facility] 04/12/2024, 5:49 PM

## 2024-04-12 NOTE — Group Note (Signed)
 Date:  04/12/2024 Time:  1:24 PM  Group Topic/Focus:  Goals Group:   The focus of this group is to help patients establish daily goals to achieve during treatment and discuss how the patient can incorporate goal setting into their daily lives to aide in recovery.    Participation Level:  Did Not Attend   Deitra Clap [facility] 04/12/2024, 1:24 PM

## 2024-04-12 NOTE — Progress Notes (Signed)
   04/11/24 2000  Psych Admission Type (Psych Patients Only)  Admission Status Voluntary  Psychosocial Assessment  Patient Complaints None  Eye Contact Fair  Facial Expression Animated  Affect Preoccupied  Speech Logical/coherent  Interaction Assertive  Motor Activity Slow  Appearance/Hygiene Unremarkable  Behavior Characteristics Cooperative;Appropriate to situation  Mood Pleasant  Aggressive Behavior  Effect No apparent injury  Thought Process  Coherency WDL  Content WDL  Delusions None reported or observed  Perception WDL  Hallucination None reported or observed  Judgment Impaired  Confusion None  Danger to Self  Current suicidal ideation? Denies  Agreement Not to Harm Self Yes  Description of Agreement verbal  Danger to Others  Danger to Others None reported or observed

## 2024-04-12 NOTE — Group Note (Signed)
 [facility] LCSW Group Therapy Note   Group Date: 04/12/2024 Start Time: 1230 End Time: 1320   Type of Therapy/Topic:  Group Therapy:  Emotion Regulation  Participation Level:  None    Description of Group:    The purpose of this group is to assist patients in learning to regulate negative emotions and experience positive emotions. Patients will be guided to discuss ways in which they have been vulnerable to their negative emotions. These vulnerabilities will be juxtaposed with experiences of positive emotions or situations, and patients challenged to use positive emotions to combat negative ones. Special emphasis will be placed on coping with negative emotions in conflict situations, and patients will process healthy conflict resolution skills.  Therapeutic Goals: Patient will identify two positive emotions or experiences to reflect on in order to balance out negative emotions:  Patient will label two or more emotions that they find the most difficult to experience:  Patient will be able to demonstrate positive conflict resolution skills through discussion or role plays:   Summary of Patient Progress: Patient walked into the group room and then walked back out. She did not participate in any of the discussion.    Therapeutic Modalities:   Cognitive Behavioral Therapy Feelings Identification Dialectical Behavioral Therapy   Nadara JONELLE Fam, LCSW

## 2024-04-13 DIAGNOSIS — F319 Bipolar disorder, unspecified: Secondary | ICD-10-CM | POA: Diagnosis not present
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
## 2024-04-13 NOTE — Group Note (Signed)
 Date:  04/13/2024 Time:  8:42 PM  Group Topic/Focus:  Spirituality:   The focus of this group is to discuss how one's spirituality can aide in recovery. Wrap-Up Group:   The focus of this group is to help patients review their daily goal of treatment and discuss progress on daily workbooks. Meditation VIDEO:    Participation Level:  Active  Participation Quality:  Appropriate  Affect:  Appropriate  Cognitive:  Appropriate  Insight: Good  Engagement in Group:  Engaged  Modes of Intervention:  Education  Additional Comments:    Kristen VEAR Gibbon 04/13/2024, 8:42 PM

## 2024-04-13 NOTE — Group Note (Signed)
 Recreation Therapy Group Note   Group Topic:General Recreation  Group Date: 04/13/2024 Start Time: 1005 End Time: 1120 Facilitators: Celestia Jeoffrey BRAVO, LRT, CTRS Location: Courtyard  Group Description: [employer]. LRT and patients played games of basketball, drew with chalk, and played corn hole while outside in the courtyard while getting fresh air and sunlight. Music was being played in the background. LRT and peers conversed about different games they have played before, what they do in their free time and anything else that is on their minds. LRT encouraged pts to drink water after being outside, sweating and getting their heart rate up.  Goal Area(s) Addressed: Patient will build on frustration tolerance skills. Patients will partake in a competitive play game with peers. Patients will gain knowledge of new leisure interest/hobby.    Affect/Mood: Appropriate   Participation Level: Active and Engaged   Participation Quality: Independent   Behavior: Calm and Cooperative   Speech/Thought Process: Coherent   Insight: Good   Judgement: Good   Modes of Intervention: Activity   Patient Response to Interventions:  Attentive, Engaged, and Receptive   Education Outcome:  Acknowledges education   Clinical Observations/Individualized Feedback: Willam was active in their participation of session activities and group discussion. Pt interacted well with LRT and peers duration of session.    Plan: Continue to engage patient in RT group sessions 2-3x/week.   Jeoffrey BRAVO Celestia, LRT, CTRS 04/13/2024 11:43 AM

## 2024-04-13 NOTE — Group Note (Signed)
 Recreation Therapy Group Note   Group Topic:Other  Group Date: 04/13/2024 Start Time: 1500 End Time: 1545 Facilitators: Celestia Jeoffrey FORBES ARTICE, CTRS Location: Craft Room  Activity Description/Intervention: Therapeutic Drumming. Patients with peers and staff were given the opportunity to engage in a leader facilitated [facility] Group Empowerment Drumming Circle with staff from the [employer], in partnership with [employer]. Teaching laboratory technician and trained [employer], Norleen Mon leading with LRT observing and documenting intervention and pt response. This evidenced-based practice targets 7 areas of health and wellbeing in the human experience including: stress-reduction, exercise, self-expression, camaraderie/support, nurturing, spirituality, and music-making (leisure).    Goal Area(s) Addresses:  Patient will engage in pro-social way in music group.  Patient will follow directions of drum leader on the first prompt. Patient will demonstrate no behavioral issues during group.  Patient will identify if a reduction in stress level occurs as a result of participation in therapeutic drum circle.     Affect/Mood: Appropriate   Participation Level: Active   Participation Quality: Independent   Behavior: Appropriate   Speech/Thought Process: Coherent   Insight: Good   Judgement: Good   Modes of Intervention: Music   Patient Response to Interventions:  Receptive   Education Outcome:  Acknowledges education   Clinical Observations/Individualized Feedback: Willam was active in their participation of session activities and group discussion.    Plan: Continue to engage patient in RT group sessions 2-3x/week.   Jeoffrey FORBES Celestia, LRT, CTRS 04/13/2024 4:26 PM

## 2024-04-13 NOTE — [facility] Counselor (Signed)
 CSW spoke with Lauren Poole about the patient's interview.  CSW was informed initially that the program was solely for individuals that have a SUD.  However, CSW pointed out that per previous conversations with the staff the program offers help with life skills.  [facility] pointed out that they are not a medical facility with nurses and physicians and CSW pointed out that to the best knowledge that pt doesn't have a medical need to address.  [facility] reported that they were full, with only top bunks that they were not ready to assign someone to.  [facility] then stated that patient was TENTATIVELY accepted for Sunday, 04/16/2024 with a medication for her Seroquel .   CSW to staff with treatment team providers.  Sherryle Margo, MSW, LCSW 04/14/2024 9:02 AM

## 2024-04-13 NOTE — Group Note (Signed)
 Date:  04/13/2024 Time:  10:54 AM  Group Topic/Focus:  Goals Group:   The focus of this group is to help patients establish daily goals to achieve during treatment and discuss how the patient can incorporate goal setting into their daily lives to aide in recovery. Self Care:   The focus of this group is to help patients understand the importance of self-care in order to improve or restore emotional, physical, spiritual, interpersonal, and financial health.   Participation Level:  Active  Participation Quality:  Appropriate  Affect:  Appropriate  Cognitive:  Appropriate  Insight: Appropriate  Engagement in Group:  Engaged  Modes of Intervention:  Discussion and Education  Additional Comments:    [name] [name] A [name] [name] 04/13/2024, 10:54 AM

## 2024-04-13 NOTE — Group Note (Signed)
 [facility] LCSW Group Therapy Note   Group Date: 04/13/2024 Start Time: 1230 End Time: 1330   Type of Therapy/Topic:  Group Therapy:  Emotion Regulation  Participation Level:  Active   Mood: Appropriate  Description of Group:    The purpose of this group is to assist patients in learning to regulate negative emotions and experience positive emotions. Patients will be guided to discuss ways in which they have been vulnerable to their negative emotions. These vulnerabilities will be juxtaposed with experiences of positive emotions or situations, and patients challenged to use positive emotions to combat negative ones. Special emphasis will be placed on coping with negative emotions in conflict situations, and patients will process healthy conflict resolution skills.  Therapeutic Goals: Patient will identify two positive emotions or experiences to reflect on in order to balance out negative emotions:  Patient will label two or more emotions that they find the most difficult to experience:  Patient will be able to demonstrate positive conflict resolution skills through discussion or role plays:   Summary of Patient Progress:   The group discussed emotional regulation and identified various triggers. Together with the facilitator, the group and patient explored these triggers, categorizing them as safe or unsafe, and examined how they affect us  physically, mentally, and emotionally. The group completed a thermometer activity to further assess where different areas of life fall on their personal thermometer scale. Members engaged in dialogue about these triggers and collaboratively developed coping strategies. The patient was receptive to feedback and actively participated with peers throughout the session.    Therapeutic Modalities:   Cognitive Behavioral Therapy Feelings Identification Dialectical Behavioral Therapy   Lauren CHRISTELLA Kerns, LCSW

## 2024-04-13 NOTE — Plan of Care (Signed)
  Problem: Education: Goal: Knowledge of [employer] [facility] Education information/materials will improve Outcome: Progressing Goal: Emotional status will improve Outcome: Progressing Goal: Mental status will improve Outcome: Progressing Goal: Verbalization of understanding the information provided will improve Outcome: Progressing   Problem: Activity: Goal: Interest or engagement in activities will improve Outcome: Progressing Goal: Sleeping patterns will improve Outcome: Progressing   Problem: Coping: Goal: Ability to verbalize frustrations and anger appropriately will improve Outcome: Progressing Goal: Ability to demonstrate self-control will improve Outcome: Progressing   Problem: Health Behavior/Discharge Planning: Goal: Identification of resources available to assist in meeting health care needs will improve Outcome: Progressing Goal: Compliance with treatment plan for underlying cause of condition will improve Outcome: Progressing   Problem: Physical Regulation: Goal: Ability to maintain clinical measurements within normal limits will improve Outcome: Progressing   Problem: Safety: Goal: Periods of time without injury will increase Outcome: Progressing   Problem: Education: Goal: Ability to state activities that reduce stress will improve Outcome: Progressing   Problem: Coping: Goal: Ability to identify and develop effective coping behavior will improve Outcome: Progressing   Problem: Self-Concept: Goal: Ability to identify factors that promote anxiety will improve Outcome: Progressing Goal: Level of anxiety will decrease Outcome: Progressing Goal: Ability to modify response to factors that promote anxiety will improve Outcome: Progressing   Problem: Education: Goal: Utilization of techniques to improve thought processes will improve Outcome: Progressing Goal: Knowledge of the prescribed therapeutic regimen will improve Outcome: Progressing   Problem:  Activity: Goal: Interest or engagement in leisure activities will improve Outcome: Progressing Goal: Imbalance in normal sleep/wake cycle will improve Outcome: Progressing   Problem: Coping: Goal: Coping ability will improve Outcome: Progressing Goal: Will verbalize feelings Outcome: Progressing   Problem: Health Behavior/Discharge Planning: Goal: Ability to make decisions will improve Outcome: Progressing Goal: Compliance with therapeutic regimen will improve Outcome: Progressing   Problem: Role Relationship: Goal: Will demonstrate positive changes in social behaviors and relationships Outcome: Progressing   Problem: Safety: Goal: Ability to disclose and discuss suicidal ideas will improve Outcome: Progressing Goal: Ability to identify and utilize support systems that promote safety will improve Outcome: Progressing   Problem: Self-Concept: Goal: Will verbalize positive feelings about self Outcome: Progressing Goal: Level of anxiety will decrease Outcome: Progressing   Problem: Education: Goal: Ability to make informed decisions regarding treatment will improve Outcome: Progressing   Problem: Coping: Goal: Coping ability will improve Outcome: Progressing   Problem: Health Behavior/Discharge Planning: Goal: Identification of resources available to assist in meeting health care needs will improve Outcome: Progressing   Problem: Medication: Goal: Compliance with prescribed medication regimen will improve Outcome: Progressing   Problem: Self-Concept: Goal: Ability to disclose and discuss suicidal ideas will improve Outcome: Progressing Goal: Will verbalize positive feelings about self Outcome: Progressing Note: Patient is on track. Patient will maintain adherence, work on increased adherence, and be monitored by provider to determine if a change in treatment plan is warranted

## 2024-04-13 NOTE — [facility] Counselor (Signed)
 CSW has sent referral with ACT[facility] to [facility], [facility], [facility] and [facility].  CSW awaiting a response.    Sherryle Margo, MSW, LCSW 04/13/2024 3:34 PM

## 2024-04-13 NOTE — Progress Notes (Signed)
   04/13/24 1000  Psych Admission Type (Psych Patients Only)  Admission Status Voluntary  Psychosocial Assessment  Patient Complaints None (patient states I just woke up)  Eye Contact None (patient basically had her eyes closed during morning assessment/med pass; after going outside for goals group, she brightened up.)  Facial Expression Flat  Affect Flat;Appropriate to circumstance (flat first thing this morning; afterwards very much appropriate)  Speech Logical/coherent  Interaction Assertive  Motor Activity Slow  Appearance/Hygiene In scrubs;Improved  Behavior Characteristics Cooperative;Appropriate to situation  Mood Pleasant  Aggressive Behavior  Effect No apparent injury  Thought Process  Coherency WDL  Content WDL  Delusions None reported or observed  Perception WDL  Hallucination None reported or observed  Judgment WDL  Confusion None  Danger to Self  Current suicidal ideation? Denies  Agreement Not to Harm Self Yes  Description of Agreement Verbal  Danger to Others  Danger to Others None reported or observed   Patient had no stated goals today and none reported on her self-inventory.

## 2024-04-14 DIAGNOSIS — F319 Bipolar disorder, unspecified: Secondary | ICD-10-CM | POA: Diagnosis not present

## 2024-04-14 MED ORDER — TRAZODONE HCL 100 MG PO TABS
100.0000 mg | ORAL_TABLET | Freq: Every day | ORAL | Status: DC
  Administered 2024-04-14 – 2024-04-15 (×2): 100 mg via ORAL
  Filled 2024-04-14 (×2): qty 1
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
## 2024-04-14 NOTE — BHH Counselor (Signed)
 Patient has been ACCEPTED to [facility].  Pt will need to stop Seroquel .  Pt can arrive Sunday by 12 noon.  Address [address]   Sherryle Margo, MSW, LCSW 04/14/2024 11:38 AM

## 2024-04-14 NOTE — Group Note (Signed)
 Recreation Therapy Group Note   Group Topic:Leisure Education  Group Date: 04/14/2024 Start Time: 1530 End Time: 1630 Facilitators: Celestia Jeoffrey FORBES ARTICE, CTRS Location: Craft Room  Group Description: Leisure. Patients were given the option to choose from singing karaoke, coloring mandalas, using oil pastels, journaling, painting or playing with play-doh. LRT and pts discussed the meaning of leisure, the importance of participating in leisure during their free time/when they're outside of the hospital, as well as how our leisure interests can also serve as coping skills.   Goal Area(s) Addressed:  Patient will identify a current leisure interest.  Patient will learn the definition of "leisure". Patient will practice making a positive decision. Patient will have the opportunity to try a new leisure activity. Patient will communicate with peers and LRT.   Affect/Mood: N/A   Participation Level: Did not attend    Clinical Observations/Individualized Feedback: Patient did not attend group.   Plan: Continue to engage patient in RT group sessions 2-3x/week.   Jeoffrey FORBES Celestia, LRT, CTRS 04/14/2024 5:37 PM
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
## 2024-04-14 NOTE — Plan of Care (Signed)
  Problem: Education: Goal: Knowledge of [facility] Education information/materials will improve Outcome: Progressing Goal: Emotional status will improve Outcome: Progressing Goal: Mental status will improve Outcome: Progressing Goal: Verbalization of understanding the information provided will improve Outcome: Progressing   Problem: Activity: Goal: Interest or engagement in activities will improve Outcome: Progressing Goal: Sleeping patterns will improve Outcome: Progressing   Problem: Coping: Goal: Ability to verbalize frustrations and anger appropriately will improve Outcome: Progressing Goal: Ability to demonstrate self-control will improve Outcome: Progressing   Problem: Health Behavior/Discharge Planning: Goal: Identification of resources available to assist in meeting health care needs will improve Outcome: Progressing Goal: Compliance with treatment plan for underlying cause of condition will improve Outcome: Progressing   Problem: Physical Regulation: Goal: Ability to maintain clinical measurements within normal limits will improve Outcome: Progressing   Problem: Safety: Goal: Periods of time without injury will increase Outcome: Progressing   Problem: Education: Goal: Ability to state activities that reduce stress will improve Outcome: Progressing   Problem: Coping: Goal: Ability to identify and develop effective coping behavior will improve Outcome: Progressing   Problem: Self-Concept: Goal: Ability to identify factors that promote anxiety will improve Outcome: Progressing Goal: Level of anxiety will decrease Outcome: Progressing Goal: Ability to modify response to factors that promote anxiety will improve Outcome: Progressing   Problem: Education: Goal: Utilization of techniques to improve thought processes will improve Outcome: Progressing Goal: Knowledge of the prescribed therapeutic regimen will improve Outcome: Progressing   Problem:  Activity: Goal: Interest or engagement in leisure activities will improve Outcome: Progressing Goal: Imbalance in normal sleep/wake cycle will improve Outcome: Progressing   Problem: Coping: Goal: Coping ability will improve Outcome: Progressing Goal: Will verbalize feelings Outcome: Progressing   Problem: Health Behavior/Discharge Planning: Goal: Ability to make decisions will improve Outcome: Progressing Goal: Compliance with therapeutic regimen will improve Outcome: Progressing   Problem: Role Relationship: Goal: Will demonstrate positive changes in social behaviors and relationships Outcome: Progressing   Problem: Safety: Goal: Ability to disclose and discuss suicidal ideas will improve Outcome: Progressing Goal: Ability to identify and utilize support systems that promote safety will improve Outcome: Progressing   Problem: Self-Concept: Goal: Will verbalize positive feelings about self Outcome: Progressing Goal: Level of anxiety will decrease Outcome: Progressing   Problem: Education: Goal: Ability to make informed decisions regarding treatment will improve Outcome: Progressing   Problem: Coping: Goal: Coping ability will improve Outcome: Progressing   Problem: Health Behavior/Discharge Planning: Goal: Identification of resources available to assist in meeting health care needs will improve Outcome: Progressing   Problem: Medication: Goal: Compliance with prescribed medication regimen will improve Outcome: Progressing   Problem: Self-Concept: Goal: Ability to disclose and discuss suicidal ideas will improve Outcome: Progressing Goal: Will verbalize positive feelings about self Outcome: Progressing   

## 2024-04-14 NOTE — Progress Notes (Addendum)
   04/14/24 1800  Psych Admission Type (Psych Patients Only)  Admission Status Voluntary  Psychosocial Assessment  Patient Complaints None  Eye Contact Fair  Facial Expression Flat  Affect Appropriate to circumstance  Speech Logical/coherent  Interaction Assertive  Motor Activity Slow  Appearance/Hygiene Improved  Behavior Characteristics Cooperative;Appropriate to situation  Mood Pleasant (patient states I feel good today, I was able to get some sleep.)  Aggressive Behavior  Effect No apparent injury  Thought Process  Coherency WDL  Content WDL  Delusions None reported or observed  Perception WDL  Hallucination None reported or observed  Judgment WDL  Confusion None  Danger to Self  Current suicidal ideation? Denies (patient states not today.)  Agreement Not to Harm Self Yes  Description of Agreement Verbal  Danger to Others  Danger to Others None reported or observed

## 2024-04-14 NOTE — Group Note (Signed)
 Recreation Therapy Group Note   Group Topic:Health and Wellness  Group Date: 04/14/2024 Start Time: 1000 End Time: 1100 Facilitators: Celestia Jeoffrey BRAVO, LRT, CTRS Location: Courtyard  Group Description: [employer]. LRT and patients played games of basketball, drew with chalk, and played corn hole while outside in the courtyard while getting fresh air and sunlight. Music was being played in the background. LRT and peers conversed about different games they have played before, what they do in their free time and anything else that is on their minds. LRT encouraged pts to drink water after being outside, sweating and getting their heart rate up.  Goal Area(s) Addressed: Patient will build on frustration tolerance skills. Patients will partake in a competitive play game with peers. Patients will gain knowledge of new leisure interest/hobby.    Affect/Mood: N/A   Participation Level: Did not attend    Clinical Observations/Individualized Feedback: Patient did not attend group.   Plan: Continue to engage patient in RT group sessions 2-3x/week.   Jeoffrey BRAVO Celestia, LRT, CTRS 04/14/2024 11:33 AM

## 2024-04-14 NOTE — Progress Notes (Signed)
 Patient has been asleep since the start of this writer's shift. Scheduled Vitamin D  will be given once she wakes up.

## 2024-04-14 NOTE — Plan of Care (Signed)
   Problem: Education: Goal: Knowledge of [facility] Education information/materials will improve Outcome: Progressing Goal: Emotional status will improve Outcome: Progressing Goal: Mental status will improve Outcome: Progressing Goal: Verbalization of understanding the information provided will improve Outcome: Progressing   Problem: Activity: Goal: Interest or engagement in activities will improve Outcome: Progressing Goal: Sleeping patterns will improve Outcome: Progressing   Problem: Coping: Goal: Ability to verbalize frustrations and anger appropriately will improve Outcome: Progressing Goal: Ability to demonstrate self-control will improve Outcome: Progressing

## 2024-04-15 DIAGNOSIS — F319 Bipolar disorder, unspecified: Secondary | ICD-10-CM | POA: Diagnosis not present

## 2024-04-15 NOTE — Group Note (Unsigned)
 Date:  04/15/2024 Time:  8:50 PM  Group Topic/Focus:  Wrap-Up Group:   The focus of this group is to help patients review their daily goal of treatment and discuss progress on daily workbooks.     Participation Level:  {BHH PARTICIPATION OZCZO:77735}  Participation Quality:  {BHH PARTICIPATION QUALITY:22265}  Affect:  {BHH AFFECT:22266}  Cognitive:  {BHH COGNITIVE:22267}  Insight: {BHH Insight2:20797}  Engagement in Group:  {BHH ENGAGEMENT IN HMNLE:77731}  Modes of Intervention:  {BHH MODES OF INTERVENTION:22269}  Additional Comments:  ***  Kerri Katz 04/15/2024, 8:50 PM

## 2024-04-15 NOTE — Group Note (Signed)
 Date:  04/15/2024 Time:  1:07 AM  Group Topic/Focus:  Goals Group:   The focus of this group is to help patients establish daily goals to achieve during treatment and discuss how the patient can incorporate goal setting into their daily lives to aide in recovery. Healthy Communication:   The focus of this group is to discuss communication, barriers to communication, as well as healthy ways to communicate with others.    Participation Level:  Active  Participation Quality:  Appropriate and Attentive  Affect:  Appropriate  Cognitive:  Alert, Appropriate, and Oriented  Insight: Appropriate and Good  Engagement in Group:  Engaged  Modes of Intervention:  Discussion and Support  Additional Comments:  N/A  [facility] LITTIE Gelineau 04/15/2024, 1:07 AM

## 2024-04-15 NOTE — Group Note (Signed)
 Date:  04/15/2024 Time:  1:12 AM  Group Topic/Focus:  Goals Group:   The focus of this group is to help patients establish daily goals to achieve during treatment and discuss how the patient can incorporate goal setting into their daily lives to aide in recovery. Healthy Communication:   The focus of this group is to discuss communication, barriers to communication, as well as healthy ways to communicate with others. Self Care:   The focus of this group is to help patients understand the importance of self-care in order to improve or restore emotional, physical, spiritual, interpersonal, and financial health.    Participation Level:  Active  Participation Quality:  Appropriate  Affect:  Appropriate  Cognitive:  Appropriate and Oriented  Insight: Appropriate  Engagement in Group:  Engaged and Supportive  Modes of Intervention:  Discussion and Support  Additional Comments:  N/A  Lauren Poole 04/15/2024, 1:12 AM

## 2024-04-15 NOTE — Progress Notes (Signed)
   04/15/24 1700  Psych Admission Type (Psych Patients Only)  Admission Status Voluntary  Psychosocial Assessment  Patient Complaints None  Eye Contact Fair  Facial Expression Animated  Affect Appropriate to circumstance  Speech Logical/coherent  Interaction Assertive  Motor Activity Slow  Appearance/Hygiene In scrubs  Behavior Characteristics Appropriate to situation  Mood Pleasant  Thought Process  Coherency WDL  Content WDL  Delusions None reported or observed  Perception WDL  Hallucination None reported or observed  Judgment WDL  Confusion None  Danger to Self  Current suicidal ideation? Denies  Agreement Not to Harm Self Yes  Description of Agreement Verbal  Danger to Others  Danger to Others None reported or observed

## 2024-04-15 NOTE — BH IP Treatment Plan (Signed)
 Interdisciplinary Treatment and Diagnostic Plan Update  04/15/2024 Time of Session: 2:40 pm Lauren Poole MRN: [id number]  Principal Diagnosis: Bipolar 1 disorder (HCC)  Secondary Diagnoses: Principal Problem:   Bipolar 1 disorder (HCC)   Current Medications:  Current Facility-Administered Medications  Medication Dose Route Frequency Provider Last Rate Last Admin   acetaminophen  (TYLENOL ) tablet 650 mg  650 mg Oral Q6H PRN Hampton, Tracie B, NP       alum & mag hydroxide-simeth (MAALOX/MYLANTA) 200-200-20 MG/5ML suspension 30 mL  30 mL Oral Q4H PRN Hampton, Tracie B, NP       cholecalciferol  (VITAMIN D3) 25 MCG (1000 UNIT) tablet 1,000 Units  1,000 Units Oral Daily Hampton, Tracie B, NP   1,000 Units at 04/14/24 1803   diphenhydrAMINE  (BENADRYL ) capsule 50 mg  50 mg Oral Q6H PRN Hampton, Tracie B, NP       haloperidol  lactate (HALDOL ) injection 5 mg  5 mg Intramuscular TID PRN Hampton, Tracie B, NP       And   diphenhydrAMINE  (BENADRYL ) injection 50 mg  50 mg Intramuscular TID PRN Hampton, Tracie B, NP       And   LORazepam  (ATIVAN ) injection 2 mg  2 mg Intramuscular TID PRN Hampton, Tracie B, NP       hydrOXYzine  (ATARAX ) tablet 25 mg  25 mg Oral TID PRN Hampton, Tracie B, NP       magnesium  hydroxide (MILK OF MAGNESIA) suspension 30 mL  30 mL Oral Daily PRN Hampton, Tracie B, NP       OLANZapine  zydis (ZYPREXA ) disintegrating tablet 5 mg  5 mg Oral BID PRN Hampton, Tracie B, NP       traZODone  (DESYREL ) tablet 100 mg  100 mg Oral QHS Jadapalle, Sree, MD   100 mg at 04/14/24 2107   traZODone  (DESYREL ) tablet 50 mg  50 mg Oral QHS PRN Hampton, Tracie B, NP       PTA Medications: Medications Prior to Admission  Medication Sig Dispense Refill Last Dose/Taking   cholecalciferol  (CHOLECALCIFEROL ) 25 MCG tablet Take 1 tablet (1,000 Units total) by mouth daily. 30 tablet 0    QUEtiapine  (SEROQUEL ) 200 MG tablet Take 1 tablet (200 mg total) by mouth at bedtime for 3 days. 3 tablet 0      Patient Stressors:    Patient Strengths:    Treatment Modalities: Medication Management, Group therapy, Case management,  1 to 1 session with clinician, Psychoeducation, Recreational therapy.   Physician Treatment Plan for Primary Diagnosis: Bipolar 1 disorder (HCC) Long Term Goal(s): Improvement in symptoms so as ready for discharge   Short Term Goals: Ability to identify changes in lifestyle to reduce recurrence of condition will improve Ability to verbalize feelings will improve Ability to disclose and discuss suicidal ideas Ability to demonstrate self-control will improve Ability to identify and develop effective coping behaviors will improve  Medication Management: Evaluate patient's response, side effects, and tolerance of medication regimen.  Therapeutic Interventions: 1 to 1 sessions, Unit Group sessions and Medication administration.  Evaluation of Outcomes: Progressing  Physician Treatment Plan for Secondary Diagnosis: Principal Problem:   Bipolar 1 disorder (HCC)  Long Term Goal(s): Improvement in symptoms so as ready for discharge   Short Term Goals: Ability to identify changes in lifestyle to reduce recurrence of condition will improve Ability to verbalize feelings will improve Ability to disclose and discuss suicidal ideas Ability to demonstrate self-control will improve Ability to identify and develop effective coping behaviors will improve  Medication Management: Evaluate patient's response, side effects, and tolerance of medication regimen.  Therapeutic Interventions: 1 to 1 sessions, Unit Group sessions and Medication administration.  Evaluation of Outcomes: Progressing   RN Treatment Plan for Primary Diagnosis: Bipolar 1 disorder (HCC) Long Term Goal(s): Knowledge of disease and therapeutic regimen to maintain health will improve  Short Term Goals: Ability to remain free from injury will improve, Ability to verbalize frustration and anger  appropriately will improve, Ability to demonstrate self-control, Ability to participate in decision making will improve, Ability to verbalize feelings will improve, Ability to disclose and discuss suicidal ideas, Ability to identify and develop effective coping behaviors will improve, and Compliance with prescribed medications will improve  Medication Management: RN will administer medications as ordered by provider, will assess and evaluate patient's response and provide education to patient for prescribed medication. RN will report any adverse and/or side effects to prescribing provider.  Therapeutic Interventions: 1 on 1 counseling sessions, Psychoeducation, Medication administration, Evaluate responses to treatment, Monitor vital signs and CBGs as ordered, Perform/monitor CIWA, COWS, AIMS and Fall Risk screenings as ordered, Perform wound care treatments as ordered.  Evaluation of Outcomes: Progressing   LCSW Treatment Plan for Primary Diagnosis: Bipolar 1 disorder (HCC) Long Term Goal(s): Safe transition to appropriate next level of care at discharge, Engage patient in therapeutic group addressing interpersonal concerns.  Short Term Goals: Engage patient in aftercare planning with referrals and resources, Increase social support, Increase ability to appropriately verbalize feelings, Increase emotional regulation, Facilitate acceptance of mental health diagnosis and concerns, Facilitate patient progression through stages of change regarding substance use diagnoses and concerns, Identify triggers associated with mental health/substance abuse issues, and Increase skills for wellness and recovery  Therapeutic Interventions: Assess for all discharge needs, 1 to 1 time with Social worker, Explore available resources and support systems, Assess for adequacy in community support network, Educate family and significant other(s) on suicide prevention, Complete Psychosocial Assessment, Interpersonal group  therapy.  Evaluation of Outcomes: Progressing   Progress in Treatment: Attending groups: Yes. and No. Participating in groups: Yes. and No. Taking medication as prescribed: Yes. Toleration medication: Yes. Family/Significant other contact made: No, will contact:  Once permission is provided Patient understands diagnosis: Yes. Discussing patient identified problems/goals with staff: Yes. Medical problems stabilized or resolved: Yes. Denies suicidal/homicidal ideation: Yes. Issues/concerns per patient self-inventory: No. Other: None  New problem(s) identified: No, Describe:  none  Update 04/15/24: No changes at this time.    New Short Term/Long Term Goal(s): detox, elimination of symptoms of psychosis, medication management for mood stabilization; elimination of SI thoughts; development of comprehensive mental wellness/sobriety plan. Update 04/15/24: No changes at this time.    Patient Goals:  feel better than the other day, I'm jut fed up with humanity Update 04/15/24: No changes at this time.    Discharge Plan or Barriers: CSW to assist in the development of appropriate discharge plans.   Update 04/15/24: pt will be discharged to [facility]   Reason for Continuation of Hospitalization: Anxiety Depression Medication stabilization Suicidal ideation   Estimated Length of Stay:  1-7 days Update 04/15/24: 04/15/24  Last 3 [place] Suicide Severity Risk Score: Flowsheet Row Admission (Current) from 04/09/2024 in [facility] INPATIENT BEH[facility]ICINE Most recent reading at 04/09/2024  6:00 PM ED from 04/09/2024 in [facility] Emergency Department at [facility] Most recent reading at 04/09/2024  2:10 AM ED from 03/24/2024 in [facility] Emergency Department at [facility] Most recent reading at 03/24/2024 12:43 AM  C-SSRS RISK CATEGORY Low Risk High Risk High Risk    Last PHQ 2/9 Scores:     No data to display          Scribe for Treatment Team: Roselyn GORMAN Lento, KEN 04/15/2024 2:38 PM
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
## 2024-04-15 NOTE — Plan of Care (Signed)
 Noor Mcgaha is a 33 y.o. female patient. No diagnosis found. Past Medical History:  Diagnosis Date   Bipolar 1 disorder (HCC)    Chlamydia    Gonorrhea    Current Facility-Administered Medications  Medication Dose Route Frequency Provider Last Rate Last Admin   acetaminophen  (TYLENOL ) tablet 650 mg  650 mg Oral Q6H PRN Hampton, Tracie B, NP       alum & mag hydroxide-simeth (MAALOX/MYLANTA) 200-200-20 MG/5ML suspension 30 mL  30 mL Oral Q4H PRN Hampton, Tracie B, NP       cholecalciferol  (VITAMIN D3) 25 MCG (1000 UNIT) tablet 1,000 Units  1,000 Units Oral Daily Hampton, Tracie B, NP   1,000 Units at 04/14/24 1803   diphenhydrAMINE  (BENADRYL ) capsule 50 mg  50 mg Oral Q6H PRN Hampton, Tracie B, NP       haloperidol  lactate (HALDOL ) injection 5 mg  5 mg Intramuscular TID PRN Hampton, Tracie B, NP       And   diphenhydrAMINE  (BENADRYL ) injection 50 mg  50 mg Intramuscular TID PRN Hampton, Tracie B, NP       And   LORazepam  (ATIVAN ) injection 2 mg  2 mg Intramuscular TID PRN Hampton, Tracie B, NP       hydrOXYzine  (ATARAX ) tablet 25 mg  25 mg Oral TID PRN Hampton, Tracie B, NP       magnesium  hydroxide (MILK OF MAGNESIA) suspension 30 mL  30 mL Oral Daily PRN Hampton, Tracie B, NP       OLANZapine  zydis (ZYPREXA ) disintegrating tablet 5 mg  5 mg Oral BID PRN Hampton, Tracie B, NP       traZODone  (DESYREL ) tablet 100 mg  100 mg Oral QHS Jadapalle, Sree, MD   100 mg at 04/14/24 2107   traZODone  (DESYREL ) tablet 50 mg  50 mg Oral QHS PRN Hampton, Tracie B, NP       No Known Allergies Principal Problem:   Bipolar 1 disorder (HCC)  Blood pressure 122/87, pulse 88, temperature (!) 97.4 F (36.3 C), resp. rate 17, height 5' 4 (1.626 m), weight 111.1 kg, SpO2 98%.  Subjective Objective Assessment & Plan   Patient Denies SI/HI this shift on assessment. Pt. participated in the Milieu and was med compliant. Contracted for safety verbally.  Adrienne Delay B Ivori Storr 04/15/2024

## 2024-04-15 NOTE — Group Note (Signed)
 Date:  04/15/2024 Time:  9:01 PM  Group Topic/Focus:  Wrap-Up Group:   The focus of this group is to help patients review their daily goal of treatment and discuss progress on daily workbooks.    Participation Level:  Active  Participation Quality:  Appropriate, Attentive, and Supportive  Affect:  Appropriate  Cognitive:  Appropriate  Insight: Appropriate and Good  Engagement in Group:  Engaged  Modes of Intervention:  Discussion  Additional Comments:     Kerri Katz 04/15/2024, 9:01 PM

## 2024-04-15 NOTE — Progress Notes (Signed)
 Pt pleasant on engagement, expressed anxiety r/t to discharge uncertainty, adding, I am going to a place that I do not know.  Visible and interacting with peers, attend and participated in wrap up group.   Denied SI/HI, AVH and depression.  Initially refused scheduled Trazodone  but later returned and took it.    04/15/24 2100  Psych Admission Type (Psych Patients Only)  Admission Status Voluntary  Psychosocial Assessment  Patient Complaints None  Eye Contact Fair  Facial Expression Animated  Affect Appropriate to circumstance  Speech Logical/coherent  Interaction Assertive  Motor Activity Other (Comment) (WDL)  Appearance/Hygiene In scrubs  Behavior Characteristics Appropriate to situation  Mood Anxious  Thought Process  Coherency WDL  Content WDL  Delusions None reported or observed  Perception WDL  Hallucination None reported or observed  Judgment WDL  Confusion None  Danger to Self  Current suicidal ideation? Denies  Agreement Not to Harm Self Yes  Description of Agreement Verbal  Danger to Others  Danger to Others None reported or observed

## 2024-04-15 NOTE — Plan of Care (Signed)
  Problem: Education: Goal: Emotional status will improve Outcome: Progressing   Problem: Education: Goal: Mental status will improve Outcome: Progressing   Problem: Education: Goal: Verbalization of understanding the information provided will improve Outcome: Progressing   Problem: Activity: Goal: Interest or engagement in activities will improve Outcome: Progressing   Problem: Coping: Goal: Ability to verbalize frustrations and anger appropriately will improve Outcome: Progressing   Problem: Coping: Goal: Ability to demonstrate self-control will improve Outcome: Progressing   Problem: Self-Concept: Goal: Level of anxiety will decrease Outcome: Progressing

## 2024-04-15 NOTE — Group Note (Signed)
 Date:  04/15/2024 Time:  7:18 PM  Group Topic/Focus:  Goals Group:   The focus of this group is to help patients establish daily goals to achieve during treatment and discuss how the patient can incorporate goal setting into their daily lives to aide in recovery. Managing Feelings:   The focus of this group is to identify what feelings patients have difficulty handling and develop a plan to handle them in a healthier way upon discharge. Self Care:   The focus of this group is to help patients understand the importance of self-care in order to improve or restore emotional, physical, spiritual, interpersonal, and financial health.    Participation Level:  Active  Participation Quality:  Appropriate  Affect:  Appropriate  Cognitive:  Alert and Appropriate  Insight: Appropriate  Engagement in Group:  Engaged  Modes of Intervention:  Activity  Additional Comments:    Bonnielee LOISE Pepper 04/15/2024, 7:18 PM

## 2024-04-16 DIAGNOSIS — F319 Bipolar disorder, unspecified: Secondary | ICD-10-CM | POA: Diagnosis not present

## 2024-04-16 MED ORDER — TRAZODONE HCL 100 MG PO TABS: 100.0000 mg | ORAL_TABLET | Freq: Every day | ORAL | 0 refills | Status: DC

## 2024-04-16 NOTE — Discharge Summary (Signed)
 Physician Discharge Summary Note  Patient:  Lauren Poole is an 33 y.o., female MRN:  [id number] DOB:  1991-01-07 Patient phone:  [phone] (home)  Patient address:   [address] [place] [address],   Total time spent: 40 min Date of Admission:  04/09/2024 Date of Discharge: 04/16/2024  Reason for Admission:  Patient is a 33 year old female who presented voluntarily to [facility] ED, brought by [employer] after reporting escalating conflict with her mother. She endorsed suicidal ideation and admitted to having violent thoughts toward others, though without specific plan, intent, or identified target    Principal Problem: Bipolar 1 disorder (HCC) Discharge Diagnoses: Principal Problem:   Bipolar 1 disorder (HCC)   Past Psychiatric History: See H&P  Family Psychiatric  History: See H&P Social History:  Social History   Substance and Sexual Activity  Alcohol Use Yes   Alcohol/week: 4.0 standard drinks of alcohol   Types: 4 Glasses of wine per week     Social History   Substance and Sexual Activity  Drug Use Not Currently   Types: Marijuana    Social History   Socioeconomic History   Marital status: Single    Spouse name: Not on file   Number of children: Not on file   Years of education: Not on file   Highest education level: Not on file  Occupational History   Not on file  Tobacco Use   Smoking status: Every Day    Current packs/day: 0.50    Average packs/day: 0.5 packs/day for 6.5 years (3.3 ttl pk-yrs)    Types: Cigarettes    Start date: 10/2017   Smokeless tobacco: Never  Vaping Use   Vaping status: Never Used  Substance and Sexual Activity   Alcohol use: Yes    Alcohol/week: 4.0 standard drinks of alcohol    Types: 4 Glasses of wine per week   Drug use: Not Currently    Types: Marijuana   Sexual activity: Yes    Birth control/protection: None  Other Topics Concern   Not on file  Social History Narrative   Not on file    Social Drivers of Health   Financial Resource Strain: Not on file  Food Insecurity: No Food Insecurity (04/09/2024)   Hunger Vital Sign    Worried About Running Out of Food in the Last Year: Never true    Ran Out of Food in the Last Year: Never true  Transportation Needs: No Transportation Needs (04/09/2024)   PRAPARE - Administrator, Civil Service (Medical): No    Lack of Transportation (Non-Medical): No  Physical Activity: Not on file  Stress: Not on file  Social Connections: Not on file   Past Medical History:  Past Medical History:  Diagnosis Date   Bipolar 1 disorder (HCC)    Chlamydia    Gonorrhea     Past Surgical History:  Procedure Laterality Date   NO PAST SURGERIES     Family History:  Family History  Problem Relation Age of Onset   Schizophrenia Sister     Hospital Course:   During the course of hospitalization, pt received daily multiple modalities of treatments consisting of Psychopharmacology, individual, group, psychoeducational, recreational, milieu therapy, including case management to coordinate pts inpatient and outpatient care and in concert with weekly treatment team meetings. Discharge planning was initiated on the day of admission to ensure a safe discharge. The presenting symptoms were closely monitored and medications were started as indicated.  On  admission, patient was calm and cooperative but endorsed intermittent suicidal ideation and violent thoughts in the context of escalating conflict with her mother and psychosocial stressors such as unemployment and unstable housing. She was admitted with Q15 minute safety checks and placed on suicide, elopement, and assault precautions. She was started on quetiapine  100 mg nightly, to which she initially reported tolerating as her body adjusted, though she later requested discontinuation, stating she wished to transition to trazodone  only. The risks and benefits of discontinuing a mood stabilizing  antipsychotic were reviewed in detail, and despite education she continued to request this change.  Over the course of treatment, patient was consistently engaged in the therapeutic milieu and group therapies, interacting appropriately with peers and staff. She reported gradual improvement in mood, sleep, and anxiety, and denied hallucinations or overt psychosis. At times she voiced ongoing frustration with her circumstances and fleeting passive thoughts of death, but denied plan or intent. She became increasingly future-focused, discussing plans for transition to the [facility] and later agreeing to pursue sober living through [facility] as part of her discharge plan. Social work coordinated this referral and discussed additional outpatient supports including ACT, CST, or DBT programming.  By the end of her stay, patient was pleasant, cooperative, and interactive on the unit. She denied active suicidal or homicidal ideation, hallucinations, or delusions, and no manic or psychotic symptoms were observed. Detailed risk assessment was completed, with acute suicide risk assessed as low and acute violence risk assessed as low. Currently, all modifiable risk of harm to self or others have been addressed and patient is no longer appropriate for the acute inpatient setting. She verbalized understanding of her discharge plan, including medications, outpatient follow-up, crisis resources, and when to seek emergency care. On the day of discharge, she was linear, logical, and future oriented, contracted for safety, and expressed gratitude toward staff. She was discharged in stable condition to [facility] sober living program. She is instructed to call 911 or present to the nearest emergency room should he experience any decompensation in mood,or return of suicidal/homicidal ideations.  Patient verbalizes understanding of this education and agrees to this plan of care  Physical Findings: AIMS:  , ,  ,  ,     CIWA:    COWS:        Psychiatric Specialty Exam:  Presentation  General Appearance:  Casual  Eye Contact: Fair  Speech: Clear and Coherent  Speech Volume: Normal    Mood and Affect  Mood: Euthymic  Affect: Congruent   Thought Process  Thought Processes: Coherent  Descriptions of Associations:Intact  Orientation:Full (Time, Place and Person)  Thought Content:WDL  Hallucinations:Hallucinations: None  Ideas of Reference:None  Suicidal Thoughts:Suicidal Thoughts: No  Homicidal Thoughts:Homicidal Thoughts: No   Sensorium  Memory: Immediate Fair; Recent Fair  Judgment: Fair  Insight: Fair   Art therapist  Concentration: Fair  Attention Span: Fair  Recall: [employer] of Knowledge: Fair  Language: Fair   Psychomotor Activity  Psychomotor Activity: Psychomotor Activity: Normal  Musculoskeletal: Strength & Muscle Tone: within normal limits Gait & Station: normal Assets  Assets: Manufacturing systems engineer; Desire for Improvement   Sleep  Sleep: Sleep: Good    Physical Exam: Physical Exam Vitals and nursing note reviewed.  HENT:     Head: Atraumatic.  Eyes:     Extraocular Movements: Extraocular movements intact.  Pulmonary:     Effort: Pulmonary effort is normal.  Neurological:     Mental Status: She is alert and  oriented to person, place, and time.  Psychiatric:        Mood and Affect: Mood normal.        Behavior: Behavior normal.        Thought Content: Thought content normal.        Judgment: Judgment normal.    Review of Systems  Psychiatric/Behavioral:  Negative for depression, hallucinations, substance abuse and suicidal ideas. The patient is not nervous/anxious and does not have insomnia.    Blood pressure 103/75, pulse 79, temperature 98.1 F (36.7 C), resp. rate 20, height 5' 4 (1.626 m), weight 111.1 kg, SpO2 99%. Body mass index is 42.05 kg/m.   Social History   Tobacco Use  Smoking  Status Every Day   Current packs/day: 0.50   Average packs/day: 0.5 packs/day for 6.5 years (3.3 ttl pk-yrs)   Types: Cigarettes   Start date: 10/2017  Smokeless Tobacco Never   Tobacco Cessation:  A prescription for an FDA-approved tobacco cessation medication was offered at discharge and the patient refused   Blood Alcohol level:  Lab Results  Component Value Date   [facility] <15 04/09/2024   [facility] <15 03/22/2024    Metabolic Disorder Labs:  Lab Results  Component Value Date   HGBA1C 5.1 10/31/2023   MPG 99.67 10/31/2023   No results found for: PROLACTIN Lab Results  Component Value Date   CHOL 136 10/31/2023   TRIG 75 10/31/2023   HDL 37 (L) 10/31/2023   CHOLHDL 3.7 10/31/2023   VLDL 15 10/31/2023   LDLCALC 84 10/31/2023    See Psychiatric Specialty Exam and Suicide Risk Assessment completed by Attending Physician prior to discharge.  Discharge destination:  Other:  [facility]  Is patient on multiple antipsychotic therapies at discharge:  No   Has Patient had three or more failed trials of antipsychotic monotherapy by history:  No  Recommended Plan for Multiple Antipsychotic Therapies: NA   Allergies as of 04/16/2024   No Known Allergies      Medication List     STOP taking these medications    QUEtiapine  200 MG tablet Commonly known as: SEROQUEL        TAKE these medications      Indication  traZODone  100 MG tablet Commonly known as: DESYREL  Take 1 tablet (100 mg total) by mouth at bedtime.  Indication: Trouble Sleeping   vitamin D3 25 MCG tablet Commonly known as: CHOLECALCIFEROL  Take 1 tablet (1,000 Units total) by mouth daily.  Indication: Vitamin D  Deficiency        Follow-up Information     [facility] Follow up.   Why: Appointment is scheduled for 04/21/2024 at 8AM.  Appointment is virtual. Contact information: [address]  [address] [place] [address] [phone]         [facility]. Go to.   Why:  Referral sent for ACTT team. Contact information: [address] [place] [address] [phone]         [facility] Go to.   Why: Referral sent for ACTT team. Contact information: [address] [place] [address] [phone]         [facility]. Go to.   Why: You will receive programming through your treatment at [facility]. Contact information: [address]  [place], [place]  Phone: [phone] Fax: [phone]                Follow-up recommendations:    # It is recommended to  the patient to continue psychiatric medications as prescribed, after discharge from the hospital.   # It is recommended to the patient to follow up with your outpatient psychiatric provider and PCP. # It was discussed with the patient, the impact of alcohol, drugs, tobacco have been there overall psychiatric and medical wellbeing, and total abstinence from substance use was recommended. # Prescriptions provided or sent directly to preferred pharmacy at discharge. Patient agreeable to plan. Given the opportunity to ask questions. Appears to feel comfortable with discharge.  # In the event of worsening symptoms, the patient is instructed to call the crisis hotline (988), 911 and or go to the nearest ED for appropriate evaluation and treatment of symptoms. To follow-up with primary care provider for other medical issues, concerns and or health care needs # Patient was discharged as requested with a plan to follow up as noted above.      Signed: Donnice FORBES Right, PA-C 04/16/2024, 10:09 AM

## 2024-04-16 NOTE — Group Note (Signed)
 Date:  04/16/2024 Time:  11:28 AM  Group Topic/Focus:  Wellness Toolbox:   The focus of this group is to discuss various aspects of wellness, balancing those aspects and exploring ways to increase the ability to experience wellness.  Patients will create a wellness toolbox for use upon discharge.    Participation Level:  Active  Participation Quality:  Appropriate  Affect:  Appropriate  Cognitive:  Appropriate  Insight: Appropriate  Engagement in Group:  Engaged  Modes of Intervention:  Socialization and Support  Additional Comments:    Lauren Poole 04/16/2024, 11:28 AM

## 2024-04-16 NOTE — Plan of Care (Signed)
  Problem: Education: Goal: Knowledge of [facility] Education information/materials will improve 04/16/2024 1130 by Trudy Arland CROME, RN Outcome: Adequate for Discharge 04/16/2024 1130 by Trudy Arland CROME, RN Outcome: Progressing Goal: Emotional status will improve 04/16/2024 1130 by Trudy Arland CROME, RN Outcome: Adequate for Discharge 04/16/2024 1130 by Trudy Arland CROME, RN Outcome: Progressing Goal: Mental status will improve 04/16/2024 1130 by Trudy Arland CROME, RN Outcome: Adequate for Discharge 04/16/2024 1130 by Trudy Arland CROME, RN Outcome: Progressing Goal: Verbalization of understanding the information provided will improve 04/16/2024 1130 by Trudy Arland CROME, RN Outcome: Adequate for Discharge 04/16/2024 1130 by Trudy Arland CROME, RN Outcome: Progressing   Problem: Activity: Goal: Interest or engagement in activities will improve 04/16/2024 1130 by Trudy Arland CROME, RN Outcome: Adequate for Discharge 04/16/2024 1130 by Trudy Arland CROME, RN Outcome: Progressing Goal: Sleeping patterns will improve 04/16/2024 1130 by Trudy Arland CROME, RN Outcome: Adequate for Discharge 04/16/2024 1130 by Trudy Arland CROME, RN Outcome: Progressing

## 2024-04-16 NOTE — Plan of Care (Signed)

## 2024-04-16 NOTE — Plan of Care (Signed)
   Problem: Education: Goal: Knowledge of [facility] Education information/materials will improve Outcome: Progressing Goal: Emotional status will improve Outcome: Progressing Goal: Mental status will improve Outcome: Progressing Goal: Verbalization of understanding the information provided will improve Outcome: Progressing

## 2024-04-16 NOTE — Progress Notes (Signed)
  [facility] Adult Case Management Discharge Plan :  Will you be returning to the same living situation after discharge:  No. Patient to go to [facility].  At discharge, do you have transportation home?: Yes,  CSW has arranged taxi services on patient's behalf.  Do you have the ability to pay for your medications: Yes,  [employer] TAILORED PLAN / TRILLIUM TAILORED PLAN  Release of information consent forms completed and in the chart;  Patient's signature needed at discharge.  Patient to Follow up at:  Follow-up Information     [facility] Follow up.   Why: Appointment is scheduled for 04/21/2024 at 8AM.  Appointment is virtual. Contact information: [address]  [address] [place] [address] [phone]         [facility]. Go to.   Why: Referral sent for ACTT team. Contact information: [address] [place] [address] [phone]         [facility] Go to.   Why: Referral sent for ACTT team. Contact information: [address] [place] [address] [phone]         [facility]. Go to.   Why: You will receive programming through your treatment at [facility]. Contact information: [address]  [place], [place]  Phone: [phone] Fax: [phone]                Next level of care provider has access to [facility] Link:no  Safety Planning and Suicide Prevention discussed: Yes,  SPE conducted with patient. SPE completed with pt, as pt refused to consent to family contact. SPI pamphlet provided to pt and pt was encouraged to share information with support network, ask questions, and talk about any concerns relating to SPE. Pt denies access to guns/firearms and verbalized understanding of information provided. Mobile Crisis information also provided to pt.       Has patient been referred to the Quitline?: Patient refused referral for treatment  Patient has been referred for addiction  treatment: Yes, the patient will follow up with an outpatient provider for substance use disorder. Psychiatrist/APP: appointment made and Therapist: appointment made. Patient to attend treatment with [facility].   Lauren CHRISTELLA Kerns, LCSW 04/16/2024, 9:52 AM

## 2024-04-16 NOTE — BHH Counselor (Signed)
 Patient had questions concerning her belongings which are still at home prior to going to her residential program.   CSW touched base with [facility] and spoke with Tillman, admissions coordinator.   Tillman reported that they are unable to take patient to retrieve her belongings, however they do have clothing available through donations for program attendees.   Tillman also reported that someone can always bring the patient's belongings to her.   This has been reported to patient.   Daysen Gundrum, MSW, LCSWA 04/16/2024 10:30 AM

## 2024-04-16 NOTE — BHH Suicide Risk Assessment (Signed)
 [facility] Discharge Suicide Risk Assessment   Principal Problem: Bipolar 1 disorder (HCC) Discharge Diagnoses: Principal Problem:   Bipolar 1 disorder (HCC)   Total Time spent with patient: 45 minutes  Musculoskeletal: Strength & Muscle Tone: within normal limits Gait & Station: normal Patient leans: N/A  Psychiatric Specialty Exam  Presentation  General Appearance:  Casual  Eye Contact: Fair  Speech: Clear and Coherent  Speech Volume: Normal  Handedness: Right   Mood and Affect  Mood: Euthymic  Duration of Depression Symptoms: Greater than two weeks  Affect: Congruent   Thought Process  Thought Processes: Coherent  Descriptions of Associations:Intact  Orientation:Full (Time, Place and Person)  Thought Content:WDL  History of Schizophrenia/Schizoaffective disorder:No  Duration of Psychotic Symptoms:Less than six months  Hallucinations:Hallucinations: None  Ideas of Reference:None  Suicidal Thoughts:Suicidal Thoughts: No  Homicidal Thoughts:Homicidal Thoughts: No   Sensorium  Memory: Immediate Fair; Recent Fair  Judgment: Fair  Insight: Fair   Art therapist  Concentration: Fair  Attention Span: Fair  Recall: [employer] of Knowledge: Fair  Language: Fair   Psychomotor Activity  Psychomotor Activity: Psychomotor Activity: Normal   Assets  Assets: Communication Skills; Desire for Improvement   Sleep  Sleep: Sleep: Good  Estimated Sleeping Duration (Last 24 Hours): 7.50-8.25 hours  Physical Exam: Physical Exam ROS Blood pressure 103/75, pulse 79, temperature 98.1 F (36.7 C), resp. rate 20, height 5' 4 (1.626 m), weight 111.1 kg, SpO2 99%. Body mass index is 42.05 kg/m.  Mental Status Per Nursing Assessment::   On Admission:  Suicidal ideation indicated by patient, Thoughts of violence towards others  Demographic Factors:  Low socioeconomic status and Unemployed  Loss Factors: NA  Historical  Factors: Prior suicide attempts and Impulsivity  Risk Reduction Factors:   Positive coping skills or problem solving skills and Going to sober living program [facility]  Continued Clinical Symptoms:  Previous Psychiatric Diagnoses and Treatments  Cognitive Features That Contribute To Risk:  None    Suicide Risk:  Minimal: No identifiable suicidal ideation.  Patients presenting with no risk factors but with morbid ruminations; may be classified as minimal risk based on the severity of the depressive symptoms   Follow-up Information     [facility] Follow up.   Why: Appointment is scheduled for 04/21/2024 at 8AM.  Appointment is virtual. Contact information: [address]  [address] [place] [address] [phone]         [facility]. Go to.   Why: Referral sent for ACTT team. Contact information: [address] [place] [address] [phone]         [facility] Go to.   Why: Referral sent for ACTT team. Contact information: [address] [place] [address] [phone]         [facility]. Go to.   Why: You will receive programming through your treatment at [facility]. Contact information: [address]  [place], [place]  Phone: [phone] Fax: [phone]                Plan Of Care/Follow-up recommendations:  # It is recommended to the patient to continue psychiatric medications as prescribed, after discharge from the hospital.   # It is recommended to the patient to follow up with your outpatient psychiatric provider and PCP. # It was discussed with the patient, the impact of alcohol, drugs, tobacco have been there overall psychiatric and medical wellbeing, and total abstinence from substance use was recommended. #  Prescriptions provided or sent directly to preferred pharmacy at discharge. Patient agreeable to plan. Given the opportunity to ask questions. Appears to feel  comfortable with discharge.  # In the event of worsening symptoms, the patient is instructed to call the crisis hotline (988), 911 and or go to the nearest ED for appropriate evaluation and treatment of symptoms. To follow-up with primary care provider for other medical issues, concerns and or health care needs # Patient was discharged as requested with a plan to follow up as noted above.    Donnice FORBES Right, PA-C 04/16/2024, 10:04 AM

## 2024-04-16 NOTE — Progress Notes (Signed)
 Pt was discharged via cab to [facility].  Pt denies SI HI AVH.  AVS and Transition summary reviewed with pt.  Belongings returned to pt satisfaction.    04/16/24 1000  Psych Admission Type (Psych Patients Only)  Admission Status Voluntary  Psychosocial Assessment  Patient Complaints None  Eye Contact Fair  Facial Expression Animated  Affect Appropriate to circumstance  Speech Logical/coherent  Interaction Assertive  Motor Activity Slow  Appearance/Hygiene In scrubs  Behavior Characteristics Appropriate to situation  Mood Anxious  Thought Process  Coherency WDL  Content WDL  Delusions None reported or observed  Perception WDL  Hallucination None reported or observed  Judgment WDL  Confusion None  Danger to Self  Current suicidal ideation? Denies  Danger to Others  Danger to Others None reported or observed

## 2024-04-30 ENCOUNTER — Emergency Department (HOSPITAL_COMMUNITY)
Admission: EM | Admit: 2024-04-30 | Discharge: 2024-04-30 | Disposition: A | Discharge status: Other Acute Inpatient Hospital | Payer: MEDICAID | Attending: Emergency Medicine | Admitting: Emergency Medicine

## 2024-04-30 ENCOUNTER — Inpatient Hospital Stay
Admission: AD | Admit: 2024-04-30 | Discharge: 2024-05-31 | DRG: 885 | Disposition: A | Discharge status: Home / Self Care | Payer: MEDICAID | Source: Intra-hospital | Attending: Psychiatry | Admitting: Psychiatry

## 2024-04-30 ENCOUNTER — Other Ambulatory Visit: Payer: Self-pay

## 2024-04-30 ENCOUNTER — Encounter: Payer: Self-pay | Admitting: Psychiatry

## 2024-04-30 DIAGNOSIS — Z79899 Other long term (current) drug therapy: Secondary | ICD-10-CM | POA: Diagnosis not present

## 2024-04-30 DIAGNOSIS — F315 Bipolar disorder, current episode depressed, severe, with psychotic features: Secondary | ICD-10-CM

## 2024-04-30 DIAGNOSIS — T1491XA Suicide attempt, initial encounter: Secondary | ICD-10-CM | POA: Diagnosis present

## 2024-04-30 DIAGNOSIS — Z5982 Transportation insecurity: Secondary | ICD-10-CM

## 2024-04-30 DIAGNOSIS — Z5941 Food insecurity: Secondary | ICD-10-CM | POA: Diagnosis not present

## 2024-04-30 DIAGNOSIS — F419 Anxiety disorder, unspecified: Secondary | ICD-10-CM | POA: Diagnosis present

## 2024-04-30 DIAGNOSIS — F17210 Nicotine dependence, cigarettes, uncomplicated: Secondary | ICD-10-CM | POA: Diagnosis present

## 2024-04-30 DIAGNOSIS — F333 Major depressive disorder, recurrent, severe with psychotic symptoms: Secondary | ICD-10-CM | POA: Diagnosis not present

## 2024-04-30 DIAGNOSIS — Z5900 Homelessness unspecified: Secondary | ICD-10-CM

## 2024-04-30 DIAGNOSIS — F22 Delusional disorders: Secondary | ICD-10-CM | POA: Diagnosis present

## 2024-04-30 DIAGNOSIS — X838XXA Intentional self-harm by other specified means, initial encounter: Secondary | ICD-10-CM | POA: Insufficient documentation

## 2024-04-30 DIAGNOSIS — Z9151 Personal history of suicidal behavior: Secondary | ICD-10-CM | POA: Diagnosis not present

## 2024-04-30 DIAGNOSIS — F411 Generalized anxiety disorder: Secondary | ICD-10-CM | POA: Diagnosis not present

## 2024-04-30 DIAGNOSIS — Z818 Family history of other mental and behavioral disorders: Secondary | ICD-10-CM | POA: Diagnosis not present

## 2024-04-30 DIAGNOSIS — R45851 Suicidal ideations: Secondary | ICD-10-CM | POA: Diagnosis present

## 2024-04-30 DIAGNOSIS — Z9152 Personal history of nonsuicidal self-harm: Secondary | ICD-10-CM

## 2024-04-30 DIAGNOSIS — F319 Bipolar disorder, unspecified: Principal | ICD-10-CM | POA: Diagnosis present

## 2024-04-30 LAB — CBC WITH DIFFERENTIAL/PLATELET
Abs Immature Granulocytes: 0.02 K/uL (ref 0.00–0.07)
Basophils Absolute: 0 K/uL (ref 0.0–0.1)
Basophils Relative: 0 %
Eosinophils Absolute: 0.2 K/uL (ref 0.0–0.5)
Eosinophils Relative: 2 %
HCT: 38.5 % (ref 36.0–46.0)
Hemoglobin: 12.9 g/dL (ref 12.0–15.0)
Immature Granulocytes: 0 %
Lymphocytes Relative: 22 %
Lymphs Abs: 1.7 K/uL (ref 0.7–4.0)
MCH: 31.9 pg (ref 26.0–34.0)
MCHC: 33.5 g/dL (ref 30.0–36.0)
MCV: 95.1 fL (ref 80.0–100.0)
Monocytes Absolute: 0.5 K/uL (ref 0.1–1.0)
Monocytes Relative: 7 %
Neutro Abs: 5.2 K/uL (ref 1.7–7.7)
Neutrophils Relative %: 69 %
Platelets: 250 K/uL (ref 150–400)
RBC: 4.05 MIL/uL (ref 3.87–5.11)
RDW: 12.7 % (ref 11.5–15.5)
WBC: 7.6 K/uL (ref 4.0–10.5)
nRBC: 0 % (ref 0.0–0.2)

## 2024-04-30 LAB — COMPREHENSIVE METABOLIC PANEL WITH GFR
ALT: 26 U/L (ref 0–44)
AST: 19 U/L (ref 15–41)
Albumin: 4.4 g/dL (ref 3.5–5.0)
Alkaline Phosphatase: 98 U/L (ref 38–126)
Anion gap: 13 (ref 5–15)
BUN: 8 mg/dL (ref 6–20)
CO2: 23 mmol/L (ref 22–32)
Calcium: 9.8 mg/dL (ref 8.9–10.3)
Chloride: 104 mmol/L (ref 98–111)
Creatinine, Ser: 0.81 mg/dL (ref 0.44–1.00)
GFR, Estimated: >60 mL/min (ref >60)
Glucose, Bld: 104 mg/dL — ABNORMAL HIGH (ref 70–99)
Potassium: 4.1 mmol/L (ref 3.5–5.1)
Sodium: 139 mmol/L (ref 135–145)
Total Bilirubin: 0.4 mg/dL (ref 0.0–1.2)
Total Protein: 7.4 g/dL (ref 6.5–8.1)

## 2024-04-30 LAB — URINALYSIS, W/ REFLEX TO CULTURE (INFECTION SUSPECTED)
Glucose, UA: NEGATIVE mg/dL
Hgb urine dipstick: NEGATIVE
Ketones, ur: 5 mg/dL — AB
Leukocytes,Ua: NEGATIVE
Nitrite: NEGATIVE
Protein, ur: 100 mg/dL — AB
Specific Gravity, Urine: >1.046 — ABNORMAL HIGH (ref 1.005–1.030)
pH: 5 (ref 5.0–8.0)

## 2024-04-30 LAB — URINE DRUG SCREEN
Amphetamines: POSITIVE — AB
Barbiturates: NEGATIVE
Benzodiazepines: NEGATIVE
Cocaine: POSITIVE — AB
Fentanyl: NEGATIVE
Methadone Scn, Ur: NEGATIVE
Opiates: NEGATIVE
Tetrahydrocannabinol: POSITIVE — AB

## 2024-04-30 LAB — ETHANOL: Alcohol, Ethyl (B): <15 mg/dL (ref <15)

## 2024-04-30 LAB — ACETAMINOPHEN LEVEL: Acetaminophen (Tylenol), Serum: <10 ug/mL — ABNORMAL LOW (ref 10–30)

## 2024-04-30 LAB — HCG, SERUM, QUALITATIVE: Preg, Serum: NEGATIVE

## 2024-04-30 LAB — SALICYLATE LEVEL: Salicylate Lvl: <7 mg/dL — ABNORMAL LOW (ref 7.0–30.0)

## 2024-04-30 MED ORDER — DIPHENHYDRAMINE HCL 50 MG/ML IJ SOLN
50.0000 mg | Freq: Three times a day (TID) | INTRAMUSCULAR | Status: DC | PRN
  Filled 2024-04-30: qty 1

## 2024-04-30 MED ORDER — ACETAMINOPHEN 325 MG PO TABS: 650.0000 mg | ORAL_TABLET | ORAL | Status: DC | PRN

## 2024-04-30 MED ORDER — ALUM & MAG HYDROXIDE-SIMETH 200-200-20 MG/5ML PO SUSP: 30.0000 mL | Freq: Four times a day (QID) | ORAL | Status: DC | PRN

## 2024-04-30 MED ORDER — DIPHENHYDRAMINE HCL 50 MG/ML IJ SOLN
50.0000 mg | Freq: Three times a day (TID) | INTRAMUSCULAR | Status: DC | PRN
  Administered 2024-05-17: 50 mg via INTRAMUSCULAR

## 2024-04-30 MED ORDER — MAGNESIUM HYDROXIDE 400 MG/5ML PO SUSP: 30.0000 mL | Freq: Every day | ORAL | Status: DC | PRN

## 2024-04-30 MED ORDER — LORAZEPAM 2 MG/ML IJ SOLN
2.0000 mg | Freq: Three times a day (TID) | INTRAMUSCULAR | Status: DC | PRN
  Administered 2024-05-17: 2 mg via INTRAMUSCULAR

## 2024-04-30 MED ORDER — ACETAMINOPHEN 325 MG PO TABS: 650.0000 mg | ORAL_TABLET | Freq: Four times a day (QID) | ORAL | Status: DC | PRN

## 2024-04-30 MED ORDER — HALOPERIDOL LACTATE 5 MG/ML IJ SOLN
5.0000 mg | Freq: Three times a day (TID) | INTRAMUSCULAR | Status: DC | PRN
  Filled 2024-04-30: qty 1

## 2024-04-30 MED ORDER — HALOPERIDOL LACTATE 5 MG/ML IJ SOLN
10.0000 mg | Freq: Three times a day (TID) | INTRAMUSCULAR | Status: DC | PRN
  Administered 2024-05-17: 10 mg via INTRAMUSCULAR
  Filled 2024-04-30: qty 2

## 2024-04-30 MED ORDER — ONDANSETRON HCL 4 MG PO TABS: 4.0000 mg | ORAL_TABLET | Freq: Three times a day (TID) | ORAL | Status: DC | PRN

## 2024-04-30 MED ORDER — ZIPRASIDONE MESYLATE 20 MG IM SOLR
20.0000 mg | Freq: Once | INTRAMUSCULAR | Status: DC
Start: 2024-04-30 — End: 2024-04-30
  Filled 2024-04-30: qty 20

## 2024-04-30 MED ORDER — DIPHENHYDRAMINE HCL 25 MG PO CAPS: 50.0000 mg | ORAL_CAPSULE | Freq: Three times a day (TID) | ORAL | Status: DC | PRN

## 2024-04-30 MED ORDER — ALUM & MAG HYDROXIDE-SIMETH 200-200-20 MG/5ML PO SUSP: 30.0000 mL | ORAL | Status: DC | PRN

## 2024-04-30 MED ORDER — NICOTINE 21 MG/24HR TD PT24: 21.0000 mg | MEDICATED_PATCH | Freq: Every day | TRANSDERMAL | Status: DC

## 2024-04-30 MED ORDER — TRAZODONE HCL 50 MG PO TABS
50.0000 mg | ORAL_TABLET | Freq: Every evening | ORAL | Status: DC | PRN
  Administered 2024-05-01 – 2024-05-14 (×4): 50 mg via ORAL
  Filled 2024-04-30 (×8): qty 1

## 2024-04-30 MED ORDER — TRAZODONE HCL 100 MG PO TABS: 100.0000 mg | ORAL_TABLET | Freq: Every day | ORAL | Status: DC

## 2024-04-30 MED ORDER — HALOPERIDOL 5 MG PO TABS: 5.0000 mg | ORAL_TABLET | Freq: Three times a day (TID) | ORAL | Status: DC | PRN

## 2024-04-30 MED ORDER — LORAZEPAM 2 MG/ML IJ SOLN
2.0000 mg | Freq: Three times a day (TID) | INTRAMUSCULAR | Status: DC | PRN
  Filled 2024-04-30: qty 1

## 2024-04-30 NOTE — Progress Notes (Signed)
 [facility]HH/BMU LCSW Progress Note   04/30/2024    1:44 PM  Lauren Poole   [id number]   Type of Contact and Topic:  Psychiatric Bed Placement   Pt accepted to [facility]U   Patient meets inpatient criteria per Cathaleen Adam, PMHNP    The attending provider will be Dr. Layne  Call report to [phone]    Norlene Levels, RN @ [facility] notified.     Pt scheduled  to arrive at [facility] for today.    Xavion Muscat, MSW, LCSW-A  1:45 PM 04/30/2024

## 2024-04-30 NOTE — ED Notes (Signed)
 Voluntary consent signed and faxed to [facility].

## 2024-04-30 NOTE — Progress Notes (Signed)
 Patient arrived to unit. She is alert and oriented. She expressed that she had a mental episode because life is so hard.She states that her financial situation is a burden. Per the patient, she is no longer overwhelmed and she feels better. She denies SI, HI, AVH. Patient rights and responsibilities and unit rules were reviewed. Patient  was oriented to the unit.

## 2024-04-30 NOTE — Group Note (Signed)
 Date:  04/30/2024 Time:  5:15 PM  Group Topic/Focus:  Goals Group:   The focus of this group is to help patients establish daily goals to achieve during treatment and discuss how the patient can incorporate goal setting into their daily lives to aide in recovery.    Participation Level:  Did Not Attend   Lauren Poole 04/30/2024, 5:15 PM

## 2024-04-30 NOTE — Tx Team (Addendum)
 Initial Treatment Plan 04/30/2024 6:56 PM Lauren Poole FMW:[id number]    PATIENT STRESSORS: Financial difficulties     PATIENT STRENGTHS: Manufacturing systems engineer    PATIENT IDENTIFIED PROBLEMS: anxiety                     DISCHARGE CRITERIA:  Ability to meet basic life and health needs Adequate post-discharge living arrangements  PRELIMINARY DISCHARGE PLAN: Attend aftercare/continuing care group  PATIENT/FAMILY INVOLVEMENT: This treatment plan has been presented to and reviewed with the patient, Lauren Poole, The patient has been given the opportunity to ask questions and make suggestions.  Titus JONETTA Bare, RN 04/30/2024, 6:56 PM

## 2024-04-30 NOTE — ED Triage Notes (Signed)
 Pt came in via EMS after a suicide attempt. Reports taking 30 pain pills around 1500. Unable to tell us  what they were but stated they were a store band and were blue. Around 50-100 mg each. Pt is A&Ox4 during triage. Denies HI. Was recently at [facility] last week.

## 2024-04-30 NOTE — ED Provider Notes (Signed)
 Emergency Department Provider Note   I have reviewed the triage vital signs and the nursing notes.   HISTORY  Chief Complaint Suicide Attempt   HPI Lauren Poole is a 33 y.o. female with past history reviewed below presents to the emergency department with suicidal ideation and intentional overdose of pills earlier today.  Patient states that around 3 PM on 9/13 she took approximately 30 off brand Aleve from the drugstore.  She is sure that these were not Tylenol  or Benadryl .  She states that she took them 1 at a time in quick succession.  She states that she also cut off her hair.  She struggles with suicidal thoughts frequently and does not feel they are particularly worse today although she did take pills which is not something she normally does.  Denies any drug use or alcohol use.  She states that she has felt completely fine.  No vomiting.  No other symptoms.  She was texting with a friend who encouraged her to seek care in the emergency department which is why she is here.   Past Medical History:  Diagnosis Date   Bipolar 1 disorder (HCC)    Chlamydia    Gonorrhea     Review of Systems  Constitutional: No fever/chills  Cardiovascular: Denies chest pain. Respiratory: Denies shortness of breath. Gastrointestinal: No abdominal pain.  Skin: Negative for rash. Neurological: Negative for headaches. Psychiatric: Positive SI.    ____________________________________________   PHYSICAL EXAM:  VITAL SIGNS: ED Triage Vitals  Encounter Vitals Group     BP 04/30/24 0105 124/88     Pulse Rate 04/30/24 0105 78     Resp 04/30/24 0105 11     Temp 04/30/24 0105 98.5 F (36.9 C)     Temp Source 04/30/24 0059 Oral     SpO2 04/30/24 0105 97 %   Constitutional: Alert and oriented. Well appearing and in no acute distress. Eyes: Conjunctivae are normal.  Head: Atraumatic. Nose: No congestion/rhinnorhea. Mouth/Throat: Mucous membranes are moist. Neck: No stridor.    Cardiovascular:  Good peripheral circulation.  Respiratory: Normal respiratory effort.  Gastrointestinal: No distention.  Musculoskeletal: No gross deformities of extremities. Neurologic:  Normal speech and language.  Skin:  Skin is warm, dry and intact. No rash noted. Psychiatric: Mood and affect are flat. Speech and behavior are normal.  ____________________________________________   LABS (all labs ordered are listed, but only abnormal results are displayed)  Labs Reviewed  ACETAMINOPHEN  LEVEL - Abnormal; Notable for the following components:      Result Value   Acetaminophen  (Tylenol ), Serum <10 (*)    All other components within normal limits  SALICYLATE LEVEL - Abnormal; Notable for the following components:   Salicylate Lvl <7.0 (*)    All other components within normal limits  URINALYSIS, W/ REFLEX TO CULTURE (INFECTION SUSPECTED) - Abnormal; Notable for the following components:   Color, Urine AMBER (*)    APPearance CLOUDY (*)    Specific Gravity, Urine >1.046 (*)    Bilirubin Urine MODERATE (*)    Ketones, ur 5 (*)    Protein, ur 100 (*)    Bacteria, UA RARE (*)    All other components within normal limits  URINE DRUG SCREEN - Abnormal; Notable for the following components:   Cocaine POSITIVE (*)    Amphetamines POSITIVE (*)    Tetrahydrocannabinol POSITIVE (*)    All other components within normal limits  COMPREHENSIVE METABOLIC PANEL WITH GFR - Abnormal; Notable for the following components:  Glucose, Bld 104 (*)    All other components within normal limits  ETHANOL  CBC WITH DIFFERENTIAL/PLATELET  HCG, SERUM, QUALITATIVE   ____________________________________________  EKG   EKG Interpretation Date/Time:  Sunday April 30 2024 01:26:15 EDT Ventricular Rate:  80 PR Interval:  132 QRS Duration:  112 QT Interval:  372 QTC Calculation: 430 R Axis:   32  Text Interpretation: Sinus rhythm Borderline intraventricular conduction delay Low voltage,  precordial leads Borderline T abnormalities, diffuse leads Confirmed by Darra Chew [phone]) on 04/30/2024 1:47:47 AM        ____________________________________________   PROCEDURES  Procedure(s) performed:   Procedures  None  ____________________________________________   INITIAL IMPRESSION / ASSESSMENT AND PLAN / ED COURSE  Pertinent labs & imaging results that were available during my care of the patient were reviewed by me and considered in my medical decision making (see chart for details).   This patient is Presenting for Evaluation of OD, which does require a range of treatment options, and is a complaint that involves a high risk of morbidity and mortality.  The Differential Diagnoses include intentional overdose, chronic suicidal ideation, unintentional overdose, AKI, electrolyte disturbance, arrhythmia, etc.  Critical Interventions-    Medications  acetaminophen  (TYLENOL ) tablet 650 mg (has no administration in time range)  ondansetron  (ZOFRAN ) tablet 4 mg (has no administration in time range)  alum & mag hydroxide-simeth (MAALOX/MYLANTA) 200-200-20 MG/5ML suspension 30 mL (has no administration in time range)  nicotine  (NICODERM CQ  - dosed in mg/24 hours) patch 21 mg (has no administration in time range)  traZODone  (DESYREL ) tablet 100 mg (has no administration in time range)    Reassessment after intervention:    I decided to review pertinent External Data, and in summary patient has had a recent psychiatry admit with discharge on 8/31.   Clinical Laboratory Tests Ordered, included Tylenol  and salicylate negative.  Pregnancy negative.  No UTI.  No anemia.  No acute kidney injury.  Cardiac Monitor Tracing which shows NSR.    Social Determinants of Health Risk denies EtOH or drug use.   Consult complete with [place] [facility].  Given the time since ingestion if labs are reassuring patient can be medically clear.  TTS consult. Recommendations pending.    Medical Decision Making: Summary:  The patient presents to the emergency department after intentional overdose earlier today.  Pills were taken around 3 PM on 9/13.  No symptoms.  Suicidal ideation appears more chronic but given the intentional overdose plan for TTS evaluation.  Patient is currently here voluntarily.  Reevaluation with update and discussion with patient.  Labs are reassuring.  As needed and home meds ordered along with diet.  Patient is medically clear for psychiatry evaluation.   Patient's presentation is most consistent with acute presentation with potential threat to life or bodily function.   Disposition: pending TTS   ____________________________________________  FINAL CLINICAL IMPRESSION(S) / ED DIAGNOSES  Final diagnoses:  Suicide attempt [facility])  Suicidal thoughts    Note:  This document was prepared using Dragon voice recognition software and may include unintentional dictation errors.  Chew Darra, MD, [facility] Emergency Medicine    Tersa Fotopoulos, Chew MATSU, MD 04/30/24 [phone]

## 2024-04-30 NOTE — Consult Note (Signed)
 [facility] Psychiatric Consult Initial  Patient Name: .Lauren Poole  MRN: [id number]  DOB: [birth year]  Consult Order details:  Orders (From admission, onward)     Start     Ordered   04/30/24 0359  CONSULT TO CALL ACT TEAM       Ordering Provider: Darra Fonda MATSU, MD  Provider:  (Not yet assigned)  Question:  Reason for Consult?  Answer:  Psych consult   04/30/24 0359             Mode of Visit: In person    Psychiatry Consult Evaluation  Service Date: April 30, 2024 LOS:  LOS: 0 days  Chief Complaint EMS after a suicide attempt. Reports taking 30 pain pills  Primary Psychiatric Diagnoses  MDD w/ psychosis Anxiety  Suicidal thoughts  Assessment  Lauren Poole is a 33 y.o. female admitted: Presented to the EDfor 04/30/2024 12:58 AM for suicidal ideations. She carries the psychiatric diagnoses of Major depressive disorder and anxiety and has a past medical history of  none.    The patient is a 33 year old female presenting after a significant suicide attempt by overdose. She demonstrates poor judgment, poor insight, and ongoing high suicide risk. Her current denial of SI is unreliable given the recent attempt and chronic suicidal thoughts. She is also at high risk for future self-harm given: Multiple prior psychiatric admissions. Active substance use with polysubstance involvement. Limited support network and unstable living situation. Poor adherence to outpatient care. The patient requires a structured, secure environment for safety, stabilization, medication management, and further assessment. Please see plan below for detailed recommendations.   Diagnoses:  Active Hospital problems: Principal Problem:   Severe bipolar I disorder, current or most recent episode depressed, with psychotic features (HCC) Active Problems:   Generalized anxiety disorder    Plan   ## Psychiatric Medication Recommendations:  Restart patient's home medications  ##  Medical Decision Making Capacity: Not specifically addressed in this encounter  ## Further Work-up:  -- No further work up needed at this time  EKG or UDS -- most recent EKG on 04/30/2024 had QtC of 430 -- Pertinent labwork reviewed earlier this admission includes: CBC, CMP, EKG, UDS    ## Disposition:-- We recommend inpatient psychiatric hospitalization when medically cleared. Patient is under voluntary admission status at this time; please IVC if attempts to leave hospital.  ## Behavioral / Environmental: - No specific recommendations at this time. , To minimize splitting of staff, assign one staff person to communicate all information from the team when feasible., or Utilize compassion and acknowledge the patient's experiences while setting clear and realistic expectations for care.    ## Safety and Observation Level:  - Based on my clinical evaluation, I estimate the patient to be at moderate risk of self harm in the current setting. - At this time, we recommend  1:1 Observation. This decision is based on my review of the chart including patient's history and current presentation, interview of the patient, mental status examination, and consideration of suicide risk including evaluating suicidal ideation, plan, intent, suicidal or self-harm behaviors, risk factors, and protective factors. This judgment is based on our ability to directly address suicide risk, implement suicide prevention strategies, and develop a safety plan while the patient is in the clinical setting. Please contact our team if there is a concern that risk level has changed.  CSSR Risk Category:C-SSRS RISK CATEGORY: High Risk  Suicide Risk Assessment: Patient has following modifiable risk factors for suicide: active suicidal ideation,  untreated depression, and current symptoms: anxiety/panic, insomnia, impulsivity, anhedonia, hopelessness, which we are addressing by recommending inpatient psychiatric admission. Patient has  following non-modifiable or demographic risk factors for suicide: history of suicide attempt and psychiatric hospitalization Patient has the following protective factors against suicide: Access to outpatient mental health care  Thank you for this consult request. Recommendations have been communicated to the primary team.  We will continue to follow patient at this time.   CATHALEEN ADAM, PMHNP       History of Present Illness  Relevant Aspects of Hospital ED Course:  Admitted on 04/30/2024 for I took pills because I didn't want to wake up.   Patient Report:  Lauren Poole, 33 y.o., female patient seen face to face by this provider, consulted with Dr. Larina; and chart reviewed on 04/30/24. The patient is a 33 year old female who arrived to the ED via EMS following a suicide attempt by overdose. She reports ingesting approximately 30 unknown pain pills around 1500 today. She describes the pills as blue, store-brand, with estimated dosage of 50-100 mg each.  After ingestion, the patient did not experience vomiting or other acute physical symptoms. She reports that initially she wanted to not wake up, but later became fearful and contacted a friend, who encouraged her to seek care in the ED.  The patient states she has struggled with chronic suicidal ideation for some time, though she feels today's attempt was impulsive rather than related to a worsening of her baseline suicidal thoughts. She also cut her hair off prior to coming to the ED, stating it was symbolic of how she has been feeling.  At the time of evaluation, the patient denies current SI, HI, or AVH. She does not elaborate on specific psychosocial triggers but expresses general frustration with her life and emotional state.  Current living situation: Residing with ex-boyfriend for the past 2 weeks. States this arrangement has been stressful but would not provide specific details.  Substance use: UDS positive for  amphetamines, cocaine, and marijuana. Patient denies current alcohol or other drug use despite positive findings.  The patient has a significant psychiatric history and is a high ED utilizer, with four psychiatric inpatient admissions in the past eight months.  Current Suicide Risk: High Recent suicide attempt by overdose. Chronic suicidal ideation with history of multiple attempts. Limited protective factors (unstable housing, minimal support system). Poor coping skills and history of impulsivity.  Homicide Risk: Low - patient denies homicidal ideation.  Substance Use Risk: Significant, given UDS positive for multiple substances and possible denial of use.   During evaluation Lauren Poole is laying down in her hospital bed.  Patient appears to be disheveled, hair recently cut off, behavior is cooperative.  She is alert and oriented x 4, calm.  Her mood is frustrated affect is flat, congruent with mood.  Thought process is logical but slow, thought content denies current SI/HI, endorses recent suicidal behavior.  Patient insight is limited and judgment is poor, demonstrated by overdose attempt. Objectively there is no evidence of psychosis/mania or delusional thinking.  Patient is able to converse coherently, goal directed thoughts, no distractibility, or pre-occupation.    Psych ROS:  Depression: Endorses Anxiety: Endorses Mania (lifetime and current): Denies Psychosis: (lifetime and current): Denies  Collateral information:  No collateral sources available at this time, per patient  Review of Systems  Psychiatric/Behavioral:  Positive for depression, substance abuse and suicidal ideas.      Psychiatric and Social History  Psychiatric History:  Information  collected from patient chart   Prev Dx/Sx: MDD Current Psych Provider: None Home Meds (current): None Previous Med Trials: Unknown Therapy: None   Prior Psych Hospitalization: Unknown Prior Self Harm:  Yes Prior Violence: Unknown   Family Psych History: Unknown Family Hx suicide: Unknown   Social History:  Developmental Hx: Deferred Educational Hx: Unknown Occupational Hx: Unemployed Legal Hx: Denies Living Situation: Homeless Spiritual Hx: Unknown Access to weapons/lethal means: Denies   Substance History Patient denies any substance abuse or alcohol abuse history per chart review patient has had an issue with alcohol in the past and has had a DUI.  UDS is positive for amphetamines, cocaine, and marijuana.   Rehab hx: Denies  Exam Findings  Physical Exam:  Vital Signs:  Temp:  [98.5 F (36.9 C)] 98.5 F (36.9 C) (09/14 0105) Pulse Rate:  [78] 78 (09/14 0105) Resp:  [11] 11 (09/14 0105) BP: (124)/(88) 124/88 (09/14 0105) SpO2:  [97 %] 97 % (09/14 0105) Blood pressure 124/88, pulse 78, temperature 98.5 F (36.9 C), temperature source Oral, resp. rate 11, SpO2 97%. There is no height or weight on file to calculate BMI.  Physical Exam Vitals and nursing note reviewed. Exam conducted with a chaperone present.  Neurological:     Mental Status: She is alert.  Psychiatric:        Attention and Perception: Attention normal.        Mood and Affect: Mood is depressed. Affect is flat.        Speech: Speech normal.        Behavior: Behavior is cooperative.        Thought Content: Thought content includes suicidal ideation. Thought content includes suicidal plan.        Cognition and Memory: Cognition is impaired.        Judgment: Judgment is inappropriate.     Mental Status Exam: General Appearance: Disheveled  Orientation:  Full (Time, Place, and Person)  Memory:  Immediate;   Fair Remote;   Fair  Concentration:  Concentration: Fair and Attention Span: Fair  Recall:  Fair  Attention  Poor  Eye Contact:  Minimal  Speech:  Clear and Coherent  Language:  Fair  Volume:  Decreased  Mood: Bizarre, guarded  Affect:  Flat and Restricted  Thought Process:   Logical but  slowed  Thought Content:  Denies current SI/HI, endorses recent suicidal behavior   Suicidal Thoughts:  Denies current SI/HI, endorses recent suicidal behavior  Homicidal Thoughts:  No  Judgement:  Poor, demonstrated by overdose attempt  Insight:  Lacking  Psychomotor Activity:  Normal  Akathisia:  No  Fund of Knowledge:  Poor    Assets:  Manufacturing systems engineer Social Support  Cognition:  WNL and Impaired,  Mild  ADL's:  Intact  AIMS (if indicated):        Other History   These have been pulled in through the EMR, reviewed, and updated if appropriate.  Family History:  The patient's family history includes Schizophrenia in her sister.  Medical History: Past Medical History:  Diagnosis Date   Bipolar 1 disorder (HCC)    Chlamydia    Gonorrhea     Surgical History: Past Surgical History:  Procedure Laterality Date   NO PAST SURGERIES       Medications:   Current Facility-Administered Medications:    acetaminophen  (TYLENOL ) tablet 650 mg, 650 mg, Oral, Q4H PRN, Long, Joshua G, MD   alum & mag hydroxide-simeth (MAALOX/MYLANTA) 200-200-20 MG/5ML suspension 30 mL, 30  mL, Oral, Q6H PRN, Long, Joshua G, MD   nicotine  (NICODERM CQ  - dosed in mg/24 hours) patch 21 mg, 21 mg, Transdermal, Daily, Long, Joshua G, MD   ondansetron  (ZOFRAN ) tablet 4 mg, 4 mg, Oral, Q8H PRN, Long, Joshua G, MD   traZODone  (DESYREL ) tablet 100 mg, 100 mg, Oral, QHS, Long, Joshua G, MD  Current Outpatient Medications:    cholecalciferol  (CHOLECALCIFEROL ) 25 MCG tablet, Take 1 tablet (1,000 Units total) by mouth daily. (Patient taking differently: Take 1,000 Units by mouth every 3 (three) days.), Disp: 30 tablet, Rfl: 0   naproxen sodium (ALEVE) 220 MG tablet, Take 440 mg by mouth daily as needed (pain)., Disp: , Rfl:    traZODone  (DESYREL ) 100 MG tablet, Take 1 tablet (100 mg total) by mouth at bedtime. (Patient not taking: Reported on 04/30/2024), Disp: 30 tablet, Rfl: 0  Allergies: No Known  Allergies  Noreen Mackintosh MOTLEY-MANGRUM, PMHNP

## 2024-04-30 NOTE — ED Notes (Signed)
 Pt dressed out into purple scrubs, phone, charger, jewelry, and clothes placed in belongings bag. Wanded by security.

## 2024-04-30 NOTE — Plan of Care (Signed)
   Problem: Education: Goal: Emotional status will improve Outcome: Progressing Goal: Mental status will improve Outcome: Progressing

## 2024-04-30 NOTE — ED Notes (Signed)
 We can accept this pt to [facility]. AttendiNg MD Layne. RN TO RN report to [phone]. pt must sign a voluntary consent and fax that to [phone] and send original with pt at transfer. DX MDD w psychosis. [facility] Address is [address] [place] [address]

## 2024-04-30 NOTE — Group Note (Signed)
 Date:  04/30/2024 Time:  10:08 PM   Additional Comments:  Did not attend group.  Lauren Poole 04/30/2024, 10:08 PM

## 2024-04-30 NOTE — Progress Notes (Signed)
   04/30/24 1846  Psych Admission Type (Psych Patients Only)  Admission Status Voluntary  Psychosocial Assessment  Patient Complaints None  Eye Contact Fair  Facial Expression Animated  Affect Appropriate to circumstance  Speech Logical/coherent  Interaction Assertive  Motor Activity Slow  Appearance/Hygiene Unremarkable  Behavior Characteristics Cooperative  Mood Pleasant  Thought Process  Coherency WDL  Content WDL  Delusions None reported or observed  Perception WDL  Hallucination None reported or observed  Judgment WDL  Confusion None  Danger to Self  Current suicidal ideation? Denies  Agreement Not to Harm Self Yes  Description of Agreement verbal  Danger to Others  Danger to Others None reported or observed

## 2024-04-30 NOTE — ED Notes (Signed)
 Pt attempted to close door. Explained to pt that she is unable to due to not having a Recruitment consultant and that nurses at the nurses station need to keep an eye on her. Pt continues to try and shut door stating she had rights. Explained that due to attempts to harm herself the door must remain open for her safety. Pt stated understanding.

## 2024-04-30 NOTE — ED Notes (Addendum)
 Contacted poison control and talked to [place]. She stated if labs come back normal as in a negative tylenol , a negative salicylate, and normal liver functions then pt can be cleared. If anything abnormal then call back for further advice.

## 2024-04-30 NOTE — Progress Notes (Signed)
   04/30/24 22:15  Psych Admission Type (Psych Patients Only)  Admission Status Voluntary  Psychosocial Assessment  Patient Complaints None  Eye Contact Fair  Facial Expression Animated  Affect Appropriate to circumstance  Speech Logical/coherent  Interaction Assertive  Motor Activity Slow  Appearance/Hygiene Unremarkable  Behavior Characteristics Cooperative  Mood Pleasant  Thought Process  Coherency WDL  Content WDL  Delusions None reported or observed  Perception WDL  Hallucination None reported or observed  Judgment WDL  Confusion None  Danger to Self  Current suicidal ideation? Denies  Agreement Not to Harm Self Yes  Description of Agreement verbal  Danger to Others  Danger to Others None reported or observed

## 2024-05-01 NOTE — Progress Notes (Signed)
   05/01/24 1012  Psych Admission Type (Psych Patients Only)  Admission Status Voluntary  Psychosocial Assessment  Patient Complaints None  Eye Contact Brief  Facial Expression Animated  Affect Appropriate to circumstance  Speech Logical/coherent  Interaction Assertive  Motor Activity Slow  Appearance/Hygiene Unremarkable  Behavior Characteristics Cooperative  Mood Pleasant  Aggressive Behavior  Effect No apparent injury  Thought Process  Coherency WDL  Content WDL  Delusions None reported or observed  Perception WDL  Hallucination None reported or observed  Judgment WDL  Confusion WDL  Danger to Self  Current suicidal ideation? Denies  Agreement Not to Harm Self Yes  Description of Agreement verbal  Danger to Others  Danger to Others None reported or observed

## 2024-05-01 NOTE — Plan of Care (Signed)
  Problem: Education: Goal: Knowledge of [facility] Education information/materials will improve Outcome: Progressing   Problem: Education: Goal: Emotional status will improve Outcome: Progressing   Problem: Education: Goal: Mental status will improve Outcome: Progressing   

## 2024-05-01 NOTE — [facility] Counselor (Signed)
 During interaction to complete the PSA, pt declined outpatient services.   Nadara SAUNDERS. Chaim, MSW, LCSW, LCAS 05/01/2024 4:30 PM

## 2024-05-01 NOTE — [facility] Suicide Risk Assessment (Signed)
 [facility] INPATIENT:  Family/Significant Other Suicide Prevention Education  Suicide Prevention Education:  Patient Refusal for Family/Significant Other Suicide Prevention Education: The patient Lauren Poole has refused to provide written consent for family/significant other to be provided Family/Significant Other Suicide Prevention Education during admission and/or prior to discharge.  Physician notified.  SPE completed with pt, as pt refused to consent to family contact. SPI pamphlet provided to pt and pt was encouraged to share information with support network, ask questions, and talk about any concerns relating to SPE. Pt denies access to guns/firearms and verbalized understanding of information provided. Mobile Crisis information also provided to pt.  Nadara JONELLE Fam 05/01/2024, 1:40 PM

## 2024-05-01 NOTE — BH IP Treatment Plan (Signed)
 Interdisciplinary Treatment and Diagnostic Plan Update  05/01/2024 Time of Session: 10:30 Lauren Poole MRN: [id number]  Principal Diagnosis: Bipolar 1 disorder (HCC)  Secondary Diagnoses: Principal Problem:   Bipolar 1 disorder (HCC)   Current Medications:  Current Facility-Administered Medications  Medication Dose Route Frequency Provider Last Rate Last Admin   acetaminophen  (TYLENOL ) tablet 650 mg  650 mg Oral Q6H PRN Motley-Mangrum, Jadeka A, PMHNP       alum & mag hydroxide-simeth (MAALOX/MYLANTA) 200-200-20 MG/5ML suspension 30 mL  30 mL Oral Q4H PRN Motley-Mangrum, Jadeka A, PMHNP       haloperidol  (HALDOL ) tablet 5 mg  5 mg Oral TID PRN Motley-Mangrum, Jadeka A, PMHNP       And   diphenhydrAMINE  (BENADRYL ) capsule 50 mg  50 mg Oral TID PRN Motley-Mangrum, Jadeka A, PMHNP       haloperidol  lactate (HALDOL ) injection 5 mg  5 mg Intramuscular TID PRN Motley-Mangrum, Jadeka A, PMHNP       And   diphenhydrAMINE  (BENADRYL ) injection 50 mg  50 mg Intramuscular TID PRN Motley-Mangrum, Jadeka A, PMHNP       And   LORazepam  (ATIVAN ) injection 2 mg  2 mg Intramuscular TID PRN Motley-Mangrum, Jadeka A, PMHNP       haloperidol  lactate (HALDOL ) injection 10 mg  10 mg Intramuscular TID PRN Motley-Mangrum, Jadeka A, PMHNP       And   diphenhydrAMINE  (BENADRYL ) injection 50 mg  50 mg Intramuscular TID PRN Motley-Mangrum, Jadeka A, PMHNP       And   LORazepam  (ATIVAN ) injection 2 mg  2 mg Intramuscular TID PRN Motley-Mangrum, Jadeka A, PMHNP       magnesium  hydroxide (MILK OF MAGNESIA) suspension 30 mL  30 mL Oral Daily PRN Motley-Mangrum, Jadeka A, PMHNP       traZODone  (DESYREL ) tablet 50 mg  50 mg Oral QHS PRN Motley-Mangrum, Jadeka A, PMHNP       PTA Medications: Medications Prior to Admission  Medication Sig Dispense Refill Last Dose/Taking   cholecalciferol  (CHOLECALCIFEROL ) 25 MCG tablet Take 1 tablet (1,000 Units total) by mouth daily. (Patient taking differently: Take 1,000  Units by mouth every 3 (three) days.) 30 tablet 0    naproxen sodium (ALEVE) 220 MG tablet Take 440 mg by mouth daily as needed (pain).      traZODone  (DESYREL ) 100 MG tablet Take 1 tablet (100 mg total) by mouth at bedtime. (Patient not taking: Reported on 04/30/2024) 30 tablet 0     Patient Stressors: Financial difficulties    Patient Strengths: Manufacturing systems engineer   Treatment Modalities: Medication Management, Group therapy, Case management,  1 to 1 session with clinician, Psychoeducation, Recreational therapy.   Physician Treatment Plan for Primary Diagnosis: Bipolar 1 disorder (HCC) Long Term Goal(s):     Short Term Goals:    Medication Management: Evaluate patient's response, side effects, and tolerance of medication regimen.  Therapeutic Interventions: 1 to 1 sessions, Unit Group sessions and Medication administration.  Evaluation of Outcomes: Not Met  Physician Treatment Plan for Secondary Diagnosis: Principal Problem:   Bipolar 1 disorder (HCC)  Long Term Goal(s):     Short Term Goals:       Medication Management: Evaluate patient's response, side effects, and tolerance of medication regimen.  Therapeutic Interventions: 1 to 1 sessions, Unit Group sessions and Medication administration.  Evaluation of Outcomes: Not Met   RN Treatment Plan for Primary Diagnosis: Bipolar 1 disorder (HCC) Long Term Goal(s): Knowledge of disease and therapeutic regimen to maintain  health will improve  Short Term Goals: Ability to remain free from injury will improve, Ability to verbalize frustration and anger appropriately will improve, Ability to demonstrate self-control, Ability to participate in decision making will improve, Ability to verbalize feelings will improve, Ability to disclose and discuss suicidal ideas, Ability to identify and develop effective coping behaviors will improve, and Compliance with prescribed medications will improve  Medication Management: RN will  administer medications as ordered by provider, will assess and evaluate patient's response and provide education to patient for prescribed medication. RN will report any adverse and/or side effects to prescribing provider.  Therapeutic Interventions: 1 on 1 counseling sessions, Psychoeducation, Medication administration, Evaluate responses to treatment, Monitor vital signs and CBGs as ordered, Perform/monitor CIWA, COWS, AIMS and Fall Risk screenings as ordered, Perform wound care treatments as ordered.  Evaluation of Outcomes: Not Met   LCSW Treatment Plan for Primary Diagnosis: Bipolar 1 disorder (HCC) Long Term Goal(s): Safe transition to appropriate next level of care at discharge, Engage patient in therapeutic group addressing interpersonal concerns.  Short Term Goals: Engage patient in aftercare planning with referrals and resources, Increase social support, Increase ability to appropriately verbalize feelings, Increase emotional regulation, Facilitate acceptance of mental health diagnosis and concerns, Facilitate patient progression through stages of change regarding substance use diagnoses and concerns, Identify triggers associated with mental health/substance abuse issues, and Increase skills for wellness and recovery  Therapeutic Interventions: Assess for all discharge needs, 1 to 1 time with Social worker, Explore available resources and support systems, Assess for adequacy in community support network, Educate family and significant other(s) on suicide prevention, Complete Psychosocial Assessment, Interpersonal group therapy.  Evaluation of Outcomes: Not Met   Progress in Treatment: Attending groups: No. Participating in groups: No. Taking medication as prescribed: Yes. Toleration medication: Yes. Family/Significant other contact made: No, will contact:  if given permission.  Patient understands diagnosis: Yes. Discussing patient identified problems/goals with staff: Yes. Medical  problems stabilized or resolved: Yes. Denies suicidal/homicidal ideation: Yes. Issues/concerns per patient self-inventory: No. Other: none.  New problem(s) identified: No, Describe:  none identified.  New Short Term/Long Term Goal(s): elimination of symptoms of psychosis, medication management for mood stabilization; elimination of SI thoughts; development of comprehensive mental wellness/sobriety plan.  Patient Goals: To go home.   Discharge Plan or Barriers: CSW will assist pt with development of an appropriate aftercare/discharge plan.   Reason for Continuation of Hospitalization: Aggression Medication stabilization  Estimated Length of Stay: 1-7 days  Last 3 [place] Suicide Severity Risk Score: Flowsheet Row Admission (Current) from 04/30/2024 in [facility] INPATIENT BEHAVIORAL MEDICINE Most recent reading at 04/30/2024  6:00 PM ED from 04/30/2024 in [facility] Emergency Department at [facility] Most recent reading at 04/30/2024  1:04 AM Admission (Discharged) from 04/09/2024 in [facility] INPATIENT BEHAVIORAL MEDICINE Most recent reading at 04/09/2024  6:00 PM  C-SSRS RISK CATEGORY High Risk High Risk Low Risk    Last PHQ 2/9 Scores:     No data to display          Scribe for Treatment Team: Nadara JONELLE Fam, LCSW 05/01/2024 11:17 AM

## 2024-05-01 NOTE — Group Note (Signed)
 Recreation Therapy Group Note   Group Topic:Coping Skills  Group Date: 05/01/2024 Start Time: 1230 End Time: 1320 Facilitators: Celestia Jeoffrey BRAVO, LRT, CTRS Location: Craft Room  Group Description: Mind Map.  Patient was provided a blank template of a diagram with 32 blank boxes in a tiered system, branching from the center (similar to a bubble chart). LRT directed patients to label the middle of the diagram Coping Skills. LRT and patients then came up with 8 different coping skills as examples. Pt were directed to record their coping skills in the 2nd tier boxes closest to the center.  Patients would then share their coping skills with the group as LRT wrote them out. LRT gave a handout of 99 different coping skills at the end of group.   Goal Area(s) Addressed: Patients will be able to define "coping skills". Patient will identify new coping skills.  Patient will increase communication.   Affect/Mood: Appropriate and Flat   Participation Level: Active and Engaged   Participation Quality: Independent   Behavior: Calm and Cooperative   Speech/Thought Process: Coherent   Insight: Good   Judgement: Good   Modes of Intervention: Clarification, Education, Worksheet, and Writing   Patient Response to Interventions:  Attentive, Interested , Receptive, and Resistant    Education Outcome:  Acknowledges education   Clinical Observations/Individualized Feedback: Lauren Poole was active in their participation of session activities and group discussion. Pt identified listen to music and be in nature as coping skills.    Plan: Continue to engage patient in RT group sessions 2-3x/week.   Jeoffrey BRAVO Celestia, LRT, CTRS 05/01/2024 1:44 PM
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
## 2024-05-01 NOTE — Plan of Care (Signed)

## 2024-05-01 NOTE — BHH Suicide Risk Assessment (Signed)
 [facility] Admission Suicide Risk Assessment   Nursing information obtained from:  Patient Demographic factors:  Unemployed Current Mental Status:  Suicidal ideation indicated by patient Loss Factors:  NA Historical Factors:  Impulsivity Risk Reduction Factors:  Positive social support  Total Time spent with patient: 30 minutes Principal Problem: Bipolar 1 disorder (HCC) Diagnosis:  Principal Problem:   Bipolar 1 disorder (HCC)  Subjective Data: This is a 33 year old female with a history of bipolar disorder and anxiety.  Presenting issues included recent suicide attempt by overdose of Aleve. Symptoms include depressed mood, hopelessness, and decreased energy, decreased appetite. She was discharged 2 weeks ago from a psychiatric hospitalization.  Patient is admitted to the adult inpatient unit with every 15 minute safety monitoring.  Multidisciplinary team approach is offered.  Medication management, group/milieu therapy is offered.  Continued Clinical Symptoms:  Alcohol Use Disorder Identification Test Final Score (AUDIT): 0 The Alcohol Use Disorders Identification Test, Guidelines for Use in Primary Care, Second Edition.  World Science writer [facility]). Score between 0-7:  no or low risk or alcohol related problems. Score between 8-15:  moderate risk of alcohol related problems. Score between 16-19:  high risk of alcohol related problems. Score 20 or above:  warrants further diagnostic evaluation for alcohol dependence and treatment.   CLINICAL FACTORS:   Bipolar Disorder:   Depressive phase   Musculoskeletal: Strength & Muscle Tone: within normal limits Gait & Station: normal Patient leans: N/A  Psychiatric Specialty Exam:  Presentation  General Appearance:  Casual  Eye Contact: Poor  Speech: Clear and Coherent  Speech Volume: Normal  Handedness: Right   Mood and Affect  Mood: Depressed; Hopeless  Affect: Congruent   Thought Process  Thought  Processes: Coherent  Descriptions of Associations:Intact  Orientation:Full (Time, Place and Person)  Thought Content:Logical  History of Schizophrenia/Schizoaffective disorder:No  Duration of Psychotic Symptoms:Less than six months  Hallucinations:Hallucinations: None  Ideas of Reference:None  Suicidal Thoughts:Suicidal Thoughts: Yes, Active SI Active Intent and/or Plan: With Intent; With Plan  Homicidal Thoughts:Homicidal Thoughts: Yes, Passive HI Passive Intent and/or Plan: Without Intent; Without Plan   Sensorium  Memory: Immediate Fair; Recent Fair; Remote Fair  Judgment: Poor  Insight: Poor   Executive Functions  Concentration: Fair  Attention Span: Fair  Recall: [employer] of Knowledge: Fair  Language: Fair   Psychomotor Activity  Psychomotor Activity: Psychomotor Activity: Normal   Assets  Assets: Communication Skills   Sleep  Sleep: Sleep: Fair    Physical Exam: Physical Exam HENT:     Head: Atraumatic.     Nose: Nose normal.  Pulmonary:     Effort: Pulmonary effort is normal.  Neurological:     Mental Status: She is alert.  Psychiatric:        Attention and Perception: Attention normal.        Mood and Affect: Mood is depressed.        Speech: Speech normal.        Behavior: Behavior is uncooperative.        Thought Content: Thought content includes homicidal and suicidal ideation. Thought content includes suicidal plan. Thought content does not include homicidal plan.        Judgment: Judgment is impulsive.    Review of Systems  Psychiatric/Behavioral:  Positive for depression and suicidal ideas. Negative for hallucinations.   All other systems reviewed and are negative.  Blood pressure 112/87, pulse 71, temperature (!) 97.4 F (36.3 C), resp. rate 17, height 5' 4 (1.626 m), weight  109.3 kg, SpO2 100%. Body mass index is 41.37 kg/m.   COGNITIVE FEATURES THAT CONTRIBUTE TO RISK:  None    SUICIDE RISK:    Severe:  Frequent, intense, and enduring suicidal ideation, specific plan, no subjective intent, but some objective markers of intent (i.e., choice of lethal method), the method is accessible, some limited preparatory behavior, evidence of impaired self-control, severe dysphoria/symptomatology, multiple risk factors present, and few if any protective factors, particularly a lack of social support.  PLAN OF CARE: Patient is admitted to the adult inpatient unit with every 15 minute safety monitoring.  Multidisciplinary team approach is offered.  Medication management, group/milieu therapy is offered.  I certify that inpatient services furnished can reasonably be expected to improve the patient's condition.   Camelia LITTIE Lukes, PA-C 05/01/2024, 5:51 PM

## 2024-05-01 NOTE — H&P (Signed)
 Psychiatric Admission Assessment Adult  Patient Identification: Lauren Poole MRN:  [id number] Date of Evaluation:  05/01/2024 Chief Complaint:  Bipolar 1 disorder (HCC) [F31.9]   History of Present Illness: The patient is a 33 year old female who presented to the ED via EMS following a suicide attempt by overdose on 04/30/24. She reported ingesting approximately 30 Aleve pills. After ingestion, the patient did not experience vomiting or other acute physical symptoms. She reports that initially she wanted to not wake up, but later became fearful and contacted a friend, who encouraged her to seek care in the ED. The patient reports she has struggled with chronic suicidal ideation for about 1 year.  She states things have been hard but declines to elaborate on specific triggers.   At the time of interview, the patient reports active SI but declines to state whether she has a current plan. She endorses passive HI but denies intent or plan.  She denies hallucinations.  Patient reports depressed mood, feelings of hopelessness and worthlessness, decreased energy, and decreased appetite.  She denies anxiety.  She reports nightmares.  She denies flashbacks.  Patient declines to participate in interview further.   Total Time spent with patient: 1 hour Sleep  Sleep:Sleep: Fair  Past Psychiatric History: Mood disorder Psychiatric History:  Information collected from the patient and chart review.  Prev Dx/Sx: Bipolar disorder Current Psych Provider: None Home Meds (current): Unknown Previous Med Trials: Seroquel  Therapy: None  Prior Psych Hospitalization: Yes Prior Self Harm: Yes Prior Violence: Unknown  Family Psych History: Denies Family Hx suicide: Denies  Social History:  Educational Hx: Third grade per patient Occupational Hx: Currently unemployed Legal Hx: Patient reports upcoming court date on 05/11/2024 Living Situation: Homeless Access to weapons/lethal means:  Denies  Substance History Alcohol: Denies Tobacco: Denies Illicit drugs: Denies Prescription drug abuse: Denies Rehab hx: Unknown Is the patient at risk to self? Yes.    Has the patient been a risk to self in the past 6 months? Yes.    Has the patient been a risk to self within the distant past? Yes.    Is the patient a risk to others? Yes.    Has the patient been a risk to others in the past 6 months?  Unknown Has the patient been a risk to others within the distant past?  Unknown  [place] Scale:  Flowsheet Row Admission (Current) from 04/30/2024 in [facility] INPATIENT BEHAVIORAL MEDICINE Most recent reading at 04/30/2024  6:00 PM ED from 04/30/2024 in [facility] Emergency Department at [facility] Most recent reading at 04/30/2024  1:04 AM Admission (Discharged) from 04/09/2024 in [facility] INPATIENT BEHAVIORAL MEDICINE Most recent reading at 04/09/2024  6:00 PM  C-SSRS RISK CATEGORY High Risk High Risk Low Risk     Past Medical History:  Past Medical History:  Diagnosis Date   Bipolar 1 disorder (HCC)    Chlamydia    Gonorrhea     Past Surgical History:  Procedure Laterality Date   NO PAST SURGERIES     Family History:  Family History  Problem Relation Age of Onset   Schizophrenia Sister     Social History:  Social History   Substance and Sexual Activity  Alcohol Use Yes   Alcohol/week: 4.0 standard drinks of alcohol   Types: 4 Glasses of wine per week     Social History   Substance and Sexual Activity  Drug Use Not Currently   Types: Marijuana      Allergies:  No Known Allergies  Lab Results:  Results for orders placed or performed during the hospital encounter of 04/30/24 (from the past 48 hours)  Acetaminophen  level     Status: Abnormal   Collection Time: 04/30/24  1:51 AM  Result Value Ref Range   Acetaminophen  (Tylenol ), Serum <10 (L) 10 - 30 ug/mL    Comment: (NOTE) Toxic concentrations can be more effectively related to post dose interval; > 200, >  100, and > 50 ug/mL serum concentrations correspond to toxic concentrations at 4, 8, and 12 hours post dose, respectively.  Performed at [facility], [address] [place], [place] [address]   Ethanol     Status: None   Collection Time: 04/30/24  1:51 AM  Result Value Ref Range   Alcohol, Ethyl (B) <15 <15 mg/dL    Comment: (NOTE) For medical purposes only. Performed at [facility], [address] [place], [place] [address]   Salicylate level     Status: Abnormal   Collection Time: 04/30/24  1:51 AM  Result Value Ref Range   Salicylate Lvl <7.0 (L) 7.0 - 30.0 mg/dL    Comment: Performed at [facility], [address], [place], [place] [address]  CBC with Differential     Status: None   Collection Time: 04/30/24  1:51 AM  Result Value Ref Range   WBC 7.6 4.0 - 10.5 K/uL   RBC 4.05 3.87 - 5.11 MIL/uL   Hemoglobin 12.9 12.0 - 15.0 g/dL   HCT 61.4 63.9 - 53.9 %   MCV 95.1 80.0 - 100.0 fL   MCH 31.9 26.0 - 34.0 pg   MCHC 33.5 30.0 - 36.0 g/dL   RDW 87.2 88.4 - 84.4 %   Platelets 250 150 - 400 K/uL   nRBC 0.0 0.0 - 0.2 %   Neutrophils Relative % 69 %   Neutro Abs 5.2 1.7 - 7.7 K/uL   Lymphocytes Relative 22 %   Lymphs Abs 1.7 0.7 - 4.0 K/uL   Monocytes Relative 7 %   Monocytes Absolute 0.5 0.1 - 1.0 K/uL   Eosinophils Relative 2 %   Eosinophils Absolute 0.2 0.0 - 0.5 K/uL   Basophils Relative 0 %   Basophils Absolute 0.0 0.0 - 0.1 K/uL   Immature Granulocytes 0 %   Abs Immature Granulocytes 0.02 0.00 - 0.07 K/uL    Comment: Performed at [facility], [address], [place], [place] [address]  hCG, serum, qualitative     Status: None   Collection Time: 04/30/24  1:51 AM  Result Value Ref Range   Preg, Serum NEGATIVE NEGATIVE    Comment:        THE SENSITIVITY OF THIS METHODOLOGY IS >10 mIU/mL. Performed at [facility], [address] [place], [place] [address]    Comprehensive metabolic panel     Status: Abnormal   Collection Time: 04/30/24  1:51 AM  Result Value Ref Range   Sodium 139 135 - 145 mmol/L   Potassium 4.1 3.5 - 5.1 mmol/L   Chloride 104 98 - 111 mmol/L   CO2 23 22 - 32 mmol/L   Glucose, Bld 104 (H) 70 - 99 mg/dL    Comment: Glucose reference range applies only to samples taken after fasting for at least 8 hours.   BUN 8 6 - 20 mg/dL   Creatinine, Ser 9.18 0.44 - 1.00 mg/dL   Calcium 9.8 8.9 - 89.6 mg/dL   Total Protein 7.4 6.5 -  8.1 g/dL   Albumin 4.4 3.5 - 5.0 g/dL   AST 19 15 - 41 U/L   ALT 26 0 - 44 U/L   Alkaline Phosphatase 98 38 - 126 U/L   Total Bilirubin 0.4 0.0 - 1.2 mg/dL   GFR, Estimated >39 >39 mL/min    Comment: (NOTE) Calculated using the CKD-EPI Creatinine Equation (2021)    Anion gap 13 5 - 15    Comment: Performed at [facility], [address], [place], [place] [address]  Urinalysis, w/ Reflex to Culture (Infection Suspected) -Urine, Clean Catch     Status: Abnormal   Collection Time: 04/30/24  2:13 AM  Result Value Ref Range   Specimen Source URINE, CLEAN CATCH    Color, Urine AMBER (A) YELLOW    Comment: BIOCHEMICALS MAY BE AFFECTED BY COLOR   APPearance CLOUDY (A) CLEAR   Specific Gravity, Urine >1.046 (H) 1.005 - 1.030   pH 5.0 5.0 - 8.0   Glucose, UA NEGATIVE NEGATIVE mg/dL   Hgb urine dipstick NEGATIVE NEGATIVE   Bilirubin Urine MODERATE (A) NEGATIVE   Ketones, ur 5 (A) NEGATIVE mg/dL   Protein, ur 899 (A) NEGATIVE mg/dL   Nitrite NEGATIVE NEGATIVE   Leukocytes,Ua NEGATIVE NEGATIVE   RBC / HPF 11-20 0 - 5 RBC/hpf   WBC, UA 0-5 0 - 5 WBC/hpf    Comment:        Reflex urine culture not performed if WBC <=10, OR if Squamous epithelial cells >5. If Squamous epithelial cells >5 suggest recollection.    Bacteria, UA RARE (A) NONE SEEN   Squamous Epithelial / HPF 21-50 0 - 5 /HPF   Mucus PRESENT     Comment: Performed at [facility], [address], [place], [place] [address]  Urine rapid drug screen (hosp performed)     Status: Abnormal   Collection Time: 04/30/24  2:14 AM  Result Value Ref Range   Opiates NEGATIVE NEGATIVE   Cocaine POSITIVE (A) NEGATIVE   Benzodiazepines NEGATIVE NEGATIVE   Amphetamines POSITIVE (A) NEGATIVE   Tetrahydrocannabinol POSITIVE (A) NEGATIVE   Barbiturates NEGATIVE NEGATIVE   Methadone Scn, Ur NEGATIVE NEGATIVE   Fentanyl  NEGATIVE NEGATIVE    Comment: (NOTE) Drug screen is for Medical Purposes only. Positive results are preliminary only. If confirmation is needed, notify lab within 5 days.  Drug Class                 Cutoff (ng/mL) Amphetamine and metabolites 1000 Barbiturate and metabolites 200 Benzodiazepine              200 Opiates and metabolites     300 Cocaine and metabolites     300 THC                         50 Fentanyl                     5 Methadone                   300  Trazodone  is metabolized in vivo to several metabolites,  including pharmacologically active m-CPP, which is excreted in the  urine.  Immunoassay screens for amphetamines and MDMA have potential  cross-reactivity with these compounds and may provide false positive  result.  Performed at [facility], [address] [place], [place] [address]     Blood Alcohol level:  Lab Results  Component  Value Date   [facility] <15 04/30/2024   ETH <15 04/09/2024    Metabolic Disorder Labs:  Lab Results  Component Value Date   HGBA1C 5.1 10/31/2023   MPG 99.67 10/31/2023   No results found for: PROLACTIN Lab Results  Component Value Date   CHOL 136 10/31/2023   TRIG 75 10/31/2023   HDL 37 (L) 10/31/2023   CHOLHDL 3.7 10/31/2023   VLDL 15 10/31/2023   LDLCALC 84 10/31/2023    Current Medications: Current Facility-Administered Medications  Medication Dose Route Frequency Provider Last Rate Last Admin   acetaminophen  (TYLENOL ) tablet 650 mg  650 mg Oral Q6H PRN Motley-Mangrum, Jadeka A,  PMHNP       alum & mag hydroxide-simeth (MAALOX/MYLANTA) 200-200-20 MG/5ML suspension 30 mL  30 mL Oral Q4H PRN Motley-Mangrum, Jadeka A, PMHNP       haloperidol  (HALDOL ) tablet 5 mg  5 mg Oral TID PRN Motley-Mangrum, Jadeka A, PMHNP       And   diphenhydrAMINE  (BENADRYL ) capsule 50 mg  50 mg Oral TID PRN Motley-Mangrum, Jadeka A, PMHNP       haloperidol  lactate (HALDOL ) injection 5 mg  5 mg Intramuscular TID PRN Motley-Mangrum, Jadeka A, PMHNP       And   diphenhydrAMINE  (BENADRYL ) injection 50 mg  50 mg Intramuscular TID PRN Motley-Mangrum, Jadeka A, PMHNP       And   LORazepam  (ATIVAN ) injection 2 mg  2 mg Intramuscular TID PRN Motley-Mangrum, Jadeka A, PMHNP       haloperidol  lactate (HALDOL ) injection 10 mg  10 mg Intramuscular TID PRN Motley-Mangrum, Jadeka A, PMHNP       And   diphenhydrAMINE  (BENADRYL ) injection 50 mg  50 mg Intramuscular TID PRN Motley-Mangrum, Jadeka A, PMHNP       And   LORazepam  (ATIVAN ) injection 2 mg  2 mg Intramuscular TID PRN Motley-Mangrum, Jadeka A, PMHNP       magnesium  hydroxide (MILK OF MAGNESIA) suspension 30 mL  30 mL Oral Daily PRN Motley-Mangrum, Jadeka A, PMHNP       traZODone  (DESYREL ) tablet 50 mg  50 mg Oral QHS PRN Motley-Mangrum, Jadeka A, PMHNP       PTA Medications: Medications Prior to Admission  Medication Sig Dispense Refill Last Dose/Taking   cholecalciferol  (CHOLECALCIFEROL ) 25 MCG tablet Take 1 tablet (1,000 Units total) by mouth daily. (Patient taking differently: Take 1,000 Units by mouth every 3 (three) days.) 30 tablet 0    naproxen sodium (ALEVE) 220 MG tablet Take 440 mg by mouth daily as needed (pain).      traZODone  (DESYREL ) 100 MG tablet Take 1 tablet (100 mg total) by mouth at bedtime. (Patient not taking: Reported on 04/30/2024) 30 tablet 0     Psychiatric Specialty Exam:  Presentation  General Appearance:  Casual  Eye Contact: Poor  Speech: Clear and Coherent  Speech Volume: Normal    Mood and Affect   Mood: Depressed; Hopeless  Affect: Congruent   Thought Process  Thought Processes: Coherent  Descriptions of Associations:Intact  Orientation:Full (Time, Place and Person)  Thought Content:Logical  Hallucinations:Hallucinations: None  Ideas of Reference:None  Suicidal Thoughts:Suicidal Thoughts: Yes, Active SI Active Intent and/or Plan: With Intent; With Plan  Homicidal Thoughts:Homicidal Thoughts: Yes, Passive HI Passive Intent and/or Plan: Without Intent; Without Plan   Sensorium  Memory: Immediate Fair; Recent Fair; Remote Fair  Judgment: Poor  Insight: Poor   Executive Functions  Concentration: Fair  Attention Span: Fair  Recall: [employer]  of Knowledge: Fair  Language: Fair   Lexicographer Activity: Psychomotor Activity: Normal   Assets  Assets: Communication Skills    Musculoskeletal: Strength & Muscle Tone: within normal limits Gait & Station: normal  Physical Exam: Physical Exam HENT:     Head: Atraumatic.     Nose: Nose normal.  Pulmonary:     Effort: Pulmonary effort is normal.  Neurological:     Mental Status: She is alert.  Psychiatric:        Attention and Perception: Attention normal.        Mood and Affect: Mood is depressed.        Speech: Speech normal.        Behavior: Behavior is uncooperative.        Thought Content: Thought content includes homicidal and suicidal ideation. Thought content includes suicidal plan. Thought content does not include homicidal plan.    Review of Systems  Psychiatric/Behavioral:  Positive for depression and suicidal ideas. Negative for hallucinations. The patient is not nervous/anxious.   All other systems reviewed and are negative.  Blood pressure 112/87, pulse 71, temperature (!) 97.4 F (36.3 C), resp. rate 17, height 5' 4 (1.626 m), weight 109.3 kg, SpO2 100%. Body mass index is 41.37 kg/m.  Principal Diagnosis: Bipolar 1 disorder (HCC) Diagnosis:   Principal Problem:   Bipolar 1 disorder [facility])   Clinical Decision Making: Patient demonstrates poor judgment, poor insight and ongoing high suicide risk.  Treatment Plan Summary:  Safety and Monitoring:             -- Voluntary admission to inpatient psychiatric unit for safety, stabilization and treatment             -- Daily contact with patient to assess and evaluate symptoms and progress in treatment             -- Patient's case to be discussed in multi-disciplinary team meeting             -- Observation Level: q15 minute checks             -- Vital signs:  q12 hours             -- Precautions: suicide, elopement, and assault   2. Psychiatric Diagnoses and Treatment:              Bipolar disorder     -- The risks/benefits/side-effects/alternatives to this medication were discussed in detail with the patient and time was given for questions. The patient consents to medication trial.                -- Metabolic profile and EKG monitoring obtained while on an atypical antipsychotic (BMI: Lipid Panel: HbgA1c: QTc:)              -- Encouraged patient to participate in unit milieu and in scheduled group therapies                            3. Medical Issues Being Addressed:      4. Discharge Planning:              -- Social work and case management to assist with discharge planning and identification of hospital follow-up needs prior to discharge             -- Estimated LOS: 5-7 days             -- Discharge Concerns:  Need to establish a safety plan; Medication compliance and effectiveness             -- Discharge Goals: Return home with outpatient referrals follow ups  Physician Treatment Plan for Primary Diagnosis: Bipolar 1 disorder (HCC) Long Term Goal(s): Improvement in symptoms so as ready for discharge  Short Term Goals: Ability to identify changes in lifestyle to reduce recurrence of condition will improve, Ability to verbalize feelings will improve, Ability to  disclose and discuss suicidal ideas, and Ability to identify and develop effective coping behaviors will improve  Physician Treatment Plan for Secondary Diagnosis: Principal Problem:   Bipolar 1 disorder (HCC)  Long Term Goal(s): Improvement in symptoms so as ready for discharge  Short Term Goals: Ability to identify changes in lifestyle to reduce recurrence of condition will improve, Ability to verbalize feelings will improve, Ability to disclose and discuss suicidal ideas, and Ability to identify and develop effective coping behaviors will improve  I certify that inpatient services furnished can reasonably be expected to improve the patient's condition.    [address] PA-C 9/15/20255:59 PM

## 2024-05-02 MED ORDER — QUETIAPINE FUMARATE ER 200 MG PO TB24
200.0000 mg | ORAL_TABLET | Freq: Every day | ORAL | Status: DC
  Administered 2024-05-02 – 2024-05-06 (×3): 200 mg via ORAL
  Filled 2024-05-02 (×6): qty 1

## 2024-05-02 NOTE — Plan of Care (Signed)
  Problem: Education: Goal: Emotional status will improve Outcome: Not Progressing Goal: Mental status will improve Outcome: Not Progressing   Problem: Activity: Goal: Interest or engagement in activities will improve Outcome: Not Progressing   Problem: Health Behavior/Discharge Planning: Goal: Compliance with treatment plan for underlying cause of condition will improve Outcome: Not Progressing   Problem: Physical Regulation: Goal: Ability to maintain clinical measurements within normal limits will improve Outcome: Progressing   Problem: Safety: Goal: Periods of time without injury will increase Outcome: Progressing

## 2024-05-02 NOTE — Group Note (Signed)
 Recreation Therapy Group Note   Group Topic:Emotion Expression  Group Date: 05/02/2024 Start Time: 1000 End Time: 1055 Facilitators: Celestia Jeoffrey BRAVO, LRT, CTRS Location: Craft Room  Group Description: Painting a [employer] operational officer. Patients and LRT discuss what it means to be "at peace", what it feels like physically and mentally. Pts are given a canvas and watercolor paint to use and encouraged to draw their idea of a peaceful place. Pts and LRT discuss how they use this in their daily life post discharge. Pts are encouraged to take their canvas home with them as a reminder to find their peaceful place whenever they are feeling depressed, anxious, etc.    Goal Area(s) Addressed:  Patient will identify what it means to experience a "peaceful" emotion. Patient will identify a new coping skill.  Patient will express their emotions through art. Patients will increase communication by talking with LRT and peers while in group.   Affect/Mood: Appropriate   Participation Level: Active and Engaged   Participation Quality: Independent   Behavior: Appropriate, Calm, and Cooperative   Speech/Thought Process: Coherent   Insight: Good   Judgement: Good   Modes of Intervention: Art   Patient Response to Interventions:  Attentive, Engaged, Interested , and Receptive   Education Outcome:  Acknowledges education   Clinical Observations/Individualized Feedback: Lauren Poole was active in their participation of session activities and group discussion. Pt identified myself. I am my own peaceful place. Pt interacted well with LRT and peers duration of session.    Plan: Continue to engage patient in RT group sessions 2-3x/week.   Jeoffrey BRAVO Celestia, LRT, CTRS 05/02/2024 12:56 PM

## 2024-05-02 NOTE — Progress Notes (Signed)
   05/02/24 1000  Psych Admission Type (Psych Patients Only)  Admission Status Voluntary  Psychosocial Assessment  Patient Complaints None  Eye Contact Poor  Facial Expression Flat  Affect Flat  Speech Soft  Interaction Isolative  Motor Activity Slow  Appearance/Hygiene Unremarkable  Behavior Characteristics Cooperative;Guarded  Mood Depressed  Thought Process  Coherency WDL  Content WDL  Delusions None reported or observed  Perception WDL  Hallucination None reported or observed  Judgment Poor  Confusion None  Danger to Self  Current suicidal ideation? Denies  Danger to Others  Danger to Others None reported or observed

## 2024-05-02 NOTE — Progress Notes (Signed)
 [facility] MD Progress Note  05/02/2024 4:53 PM Lauren Poole  MRN:  [id number]   Subjective:  Chart reviewed, case discussed in multidisciplinary meeting, patient seen during rounds.   On interview, patient is noted to be laying in bed.  Per nursing report, she has primarily been isolated to her room since her arrival on the unit.  She endorses passive SI without current intent or plan.  She denies HI/plan and denies hallucinations.  She reports she is sleeping and eating well.  Interviewer discussed treatment options with patient to include medication.  Patient states she prefers to be restarted on Seroquel , which she has taken in the past with good response.  Patient does not voice any other complaints or concerns today.   Sleep: Good  Appetite:  Good  Past Psychiatric History: see h&P Family History:  Family History  Problem Relation Age of Onset   Schizophrenia Sister    Social History:  Social History   Substance and Sexual Activity  Alcohol Use Yes   Alcohol/week: 4.0 standard drinks of alcohol   Types: 4 Glasses of wine per week     Social History   Substance and Sexual Activity  Drug Use Not Currently   Types: Marijuana    Social History   Socioeconomic History   Marital status: Single    Spouse name: Not on file   Number of children: Not on file   Years of education: Not on file   Highest education level: Not on file  Occupational History   Not on file  Tobacco Use   Smoking status: Every Day    Current packs/day: 0.50    Average packs/day: 0.5 packs/day for 6.5 years (3.3 ttl pk-yrs)    Types: Cigarettes    Start date: 10/2017   Smokeless tobacco: Never  Vaping Use   Vaping status: Never Used  Substance and Sexual Activity   Alcohol use: Yes    Alcohol/week: 4.0 standard drinks of alcohol    Types: 4 Glasses of wine per week   Drug use: Not Currently    Types: Marijuana   Sexual activity: Yes    Birth control/protection: None  Other Topics  Concern   Not on file  Social History Narrative   Not on file   Social Drivers of Health   Financial Resource Strain: Not on file  Food Insecurity: Food Insecurity Present (04/30/2024)   Hunger Vital Sign    Worried About Running Out of Food in the Last Year: Sometimes true    Ran Out of Food in the Last Year: Sometimes true  Transportation Needs: Unmet Transportation Needs (04/30/2024)   PRAPARE - Administrator, Civil Service (Medical): Yes    Lack of Transportation (Non-Medical): Yes  Physical Activity: Not on file  Stress: Not on file  Social Connections: Not on file   Past Medical History:  Past Medical History:  Diagnosis Date   Bipolar 1 disorder (HCC)    Chlamydia    Gonorrhea     Past Surgical History:  Procedure Laterality Date   NO PAST SURGERIES      Current Medications: Current Facility-Administered Medications  Medication Dose Route Frequency Provider Last Rate Last Admin   acetaminophen  (TYLENOL ) tablet 650 mg  650 mg Oral Q6H PRN Motley-Mangrum, Jadeka A, PMHNP       alum & mag hydroxide-simeth (MAALOX/MYLANTA) 200-200-20 MG/5ML suspension 30 mL  30 mL Oral Q4H PRN Motley-Mangrum, Jadeka A, PMHNP       haloperidol  (HALDOL )  tablet 5 mg  5 mg Oral TID PRN Motley-Mangrum, Jadeka A, PMHNP       And   diphenhydrAMINE  (BENADRYL ) capsule 50 mg  50 mg Oral TID PRN Motley-Mangrum, Jadeka A, PMHNP       haloperidol  lactate (HALDOL ) injection 5 mg  5 mg Intramuscular TID PRN Motley-Mangrum, Jadeka A, PMHNP       And   diphenhydrAMINE  (BENADRYL ) injection 50 mg  50 mg Intramuscular TID PRN Motley-Mangrum, Jadeka A, PMHNP       And   LORazepam  (ATIVAN ) injection 2 mg  2 mg Intramuscular TID PRN Motley-Mangrum, Jadeka A, PMHNP       haloperidol  lactate (HALDOL ) injection 10 mg  10 mg Intramuscular TID PRN Motley-Mangrum, Jadeka A, PMHNP       And   diphenhydrAMINE  (BENADRYL ) injection 50 mg  50 mg Intramuscular TID PRN Motley-Mangrum, Jadeka A, PMHNP        And   LORazepam  (ATIVAN ) injection 2 mg  2 mg Intramuscular TID PRN Motley-Mangrum, Jadeka A, PMHNP       magnesium  hydroxide (MILK OF MAGNESIA) suspension 30 mL  30 mL Oral Daily PRN Motley-Mangrum, Jadeka A, PMHNP       QUEtiapine  (SEROQUEL  XR) 24 hr tablet 200 mg  200 mg Oral QHS Shaletta Hinostroza L, PA-C       traZODone  (DESYREL ) tablet 50 mg  50 mg Oral QHS PRN Motley-Mangrum, Jadeka A, PMHNP   50 mg at 05/01/24 2108    Lab Results: No results found for this or any previous visit (from the past 48 hours).  Blood Alcohol level:  Lab Results  Component Value Date   [facility] <15 04/30/2024   ETH <15 04/09/2024    Metabolic Disorder Labs: Lab Results  Component Value Date   HGBA1C 5.1 10/31/2023   MPG 99.67 10/31/2023   No results found for: PROLACTIN Lab Results  Component Value Date   CHOL 136 10/31/2023   TRIG 75 10/31/2023   HDL 37 (L) 10/31/2023   CHOLHDL 3.7 10/31/2023   VLDL 15 10/31/2023   LDLCALC 84 10/31/2023    Physical Findings: AIMS:  , ,  ,  ,    CIWA:    COWS:      Psychiatric Specialty Exam:  Presentation  General Appearance:  Casual  Eye Contact: Poor  Speech: Clear and Coherent  Speech Volume: Normal    Mood and Affect  Mood: Depressed; Hopeless  Affect: Congruent   Thought Process  Thought Processes: Coherent  Descriptions of Associations:Intact  Orientation:Full (Time, Place and Person)  Thought Content:Logical  Hallucinations:Hallucinations: None  Ideas of Reference:None  Suicidal Thoughts: Yes, passive  Homicidal Thoughts: None   Sensorium  Memory: Immediate Fair; Recent Fair; Remote Fair  Judgment: Poor  Insight: Poor   Executive Functions  Concentration: Fair  Attention Span: Fair  Recall: [employer] of Knowledge: Fair  Language: Fair   Psychomotor Activity  Psychomotor Activity: Psychomotor Activity: Normal  Musculoskeletal: Strength & Muscle Tone: within normal limits Gait &  Station: normal Assets  Assets: Communication Skills    Physical Exam: Physical Exam HENT:     Nose: Nose normal.     Mouth/Throat:     Mouth: Mucous membranes are moist.  Pulmonary:     Effort: Pulmonary effort is normal.  Neurological:     Mental Status: She is alert.  Psychiatric:        Attention and Perception: Attention normal.        Mood and  Affect: Mood is depressed.        Speech: Speech normal.        Behavior: Behavior is withdrawn. Behavior is not agitated, aggressive, hyperactive or combative.        Thought Content: Thought content includes suicidal ideation. Thought content does not include homicidal ideation. Thought content does not include homicidal or suicidal plan.    Review of Systems  Psychiatric/Behavioral:  Positive for depression and suicidal ideas. Negative for hallucinations. The patient does not have insomnia.   All other systems reviewed and are negative.  Blood pressure 114/70, pulse 65, temperature 98.1 F (36.7 C), temperature source Oral, resp. rate 17, height 5' 4 (1.626 m), weight 109.3 kg, SpO2 100%. Body mass index is 41.37 kg/m.  Diagnosis: Principal Problem:   Bipolar 1 disorder (HCC)   PLAN: Safety and Monitoring:  -- Voluntary admission to inpatient psychiatric unit for safety, stabilization and treatment  -- Daily contact with patient to assess and evaluate symptoms and progress in treatment  -- Patient's case to be discussed in multi-disciplinary team meeting  -- Observation Level : q15 minute checks  -- Vital signs:  q12 hours  -- Precautions: suicide, elopement, and assault -- Encouraged patient to participate in unit milieu and in scheduled group therapies  2. Psychiatric Diagnoses and Treatment:  Bipolar disorder  Start Seroquel  XR 200 mg at bedtime        3. Medical Issues Being Addressed:     4. Discharge Planning:   -- Social work and case management to assist with discharge planning and identification of  hospital follow-up needs prior to discharge  -- Estimated LOS: 3-4 days  Camelia LITTIE Lukes, PA-C 05/02/2024, 4:53 PM

## 2024-05-02 NOTE — Plan of Care (Signed)
   Problem: Education: Goal: Emotional status will improve Outcome: Progressing Goal: Mental status will improve Outcome: Progressing

## 2024-05-02 NOTE — Group Note (Signed)
 Date:  05/02/2024 Time:  8:57 PM  Group Topic/Focus:  Spirituality:   The focus of this group is to discuss how one's spirituality can aide in recovery.    Participation Level:  Active  Participation Quality:  Appropriate and Attentive  Affect:  Appropriate  Cognitive:  Alert and Appropriate  Insight: Appropriate  Engagement in Group:  Engaged and Improving  Modes of Intervention:  Discussion, Education, Problem-solving, Rapport Building, Dance movement psychotherapist, and Support  Additional Comments:     Carrera Kiesel 05/02/2024, 8:57 PM

## 2024-05-02 NOTE — Group Note (Signed)
 Date:  05/02/2024 Time:  5:41 PM  Group Topic/Focus:  Wellness Toolbox:   The focus of this group is to discuss various aspects of wellness, balancing those aspects and exploring ways to increase the ability to experience wellness.  Patients will create a wellness toolbox for use upon discharge.    Participation Level:  Active  Participation Quality:  Appropriate  Affect:  Appropriate  Cognitive:  Appropriate  Insight: Appropriate  Engagement in Group:  Engaged  Modes of Intervention:  Activity and Socialization  Additional Comments:    Deitra Caron [place] 05/02/2024, 5:41 PM

## 2024-05-02 NOTE — Progress Notes (Signed)
 Pt is minimal to bedroom. She states that she has a good appetite and is seen drinking fluids well. She c/o 7/10 anxiety and depression. She denies physical symptoms. Denies SI/HI/AVH at this time. Pt encouraged to approach staff if thoughts of harming self arise. Pt verbally contracts for safety. She is med compliant and is safe on the unit at this time with Q 15 min safety checks in place

## 2024-05-02 NOTE — Group Note (Signed)
 Date:  05/02/2024 Time:  1:56 PM  Group Topic/Focus:  Making Healthy Choices:   The focus of this group is to help patients identify negative/unhealthy choices they were using prior to admission and identify positive/healthier coping strategies to replace them upon discharge.    Participation Level:  Active  Participation Quality:  Appropriate  Affect:  Appropriate  Cognitive:  Appropriate  Insight: Appropriate  Engagement in Group:  Engaged  Modes of Intervention:  Activity  Additional Comments:    Camellia [name] Mitchael Luckey 05/02/2024, 1:56 PM

## 2024-05-03 NOTE — Group Note (Signed)
 Date:  05/03/2024 Time:  7:10 PM  Group Topic/Focus:  Wellness Toolbox:   The focus of this group is to discuss various aspects of wellness, balancing those aspects and exploring ways to increase the ability to experience wellness.  Patients will create a wellness toolbox for use upon discharge.    Participation Level:  Did Not Attend   Deitra Clap [facility] 05/03/2024, 7:10 PM

## 2024-05-03 NOTE — Plan of Care (Signed)
  Problem: Education: Goal: Emotional status will improve Outcome: Not Progressing Goal: Mental status will improve Outcome: Not Progressing   Problem: Activity: Goal: Interest or engagement in activities will improve Outcome: Not Progressing   Problem: Health Behavior/Discharge Planning: Goal: Compliance with treatment plan for underlying cause of condition will improve Outcome: Not Progressing   Problem: Physical Regulation: Goal: Ability to maintain clinical measurements within normal limits will improve Outcome: Progressing   Problem: Safety: Goal: Periods of time without injury will increase Outcome: Progressing

## 2024-05-03 NOTE — Group Note (Signed)
 Date:  05/03/2024 Time:  3:08 PM  Group Topic/Focus:  Building Self Esteem:   The Focus of this group is helping patients become aware of the effects of self-esteem on their lives, the things they and others do that enhance or undermine their self-esteem, seeing the relationship between their level of self-esteem and the choices they make and learning ways to enhance self-esteem.    Participation Level:  Did Not Attend   [name] 05/03/2024, 3:08 PM

## 2024-05-03 NOTE — Progress Notes (Signed)
   05/03/24 1200  Psych Admission Type (Psych Patients Only)  Admission Status Voluntary  Psychosocial Assessment  Patient Complaints Hopelessness  Eye Contact Brief  Facial Expression Flat  Affect Flat  Speech Soft  Interaction Isolative  Motor Activity Slow  Appearance/Hygiene Unremarkable  Behavior Characteristics Cooperative  Mood Apathetic  Thought Process  Coherency WDL  Content WDL  Delusions None reported or observed  Perception WDL  Hallucination None reported or observed  Judgment Poor  Confusion None  Danger to Self  Current suicidal ideation? Denies  Danger to Others  Danger to Others None reported or observed

## 2024-05-03 NOTE — Group Note (Signed)
 Date:  05/03/2024 Time:  8:41 PM  Group Topic/Focus:  Wrap-Up Group:   The focus of this group is to help patients review their daily goal of treatment and discuss progress on daily workbooks.    Participation Level:  Active  Participation Quality:  Appropriate and Attentive  Affect:  Appropriate  Cognitive:  Appropriate  Insight: Appropriate and Good  Engagement in Group:  Supportive  Modes of Intervention:  Discussion  Additional Comments:     Kerri Katz 05/03/2024, 8:41 PM

## 2024-05-03 NOTE — Group Note (Signed)
 [facility] LCSW Group Therapy Note   Group Date: 05/03/2024 Start Time: 1300 End Time: 1415   Type of Therapy/Topic:  Group Therapy:  Emotion Regulation  Participation Level:  Did Not Attend    Description of Group:    The purpose of this group is to assist patients in learning to regulate negative emotions and experience positive emotions. Patients will be guided to discuss ways in which they have been vulnerable to their negative emotions. These vulnerabilities will be juxtaposed with experiences of positive emotions or situations, and patients challenged to use positive emotions to combat negative ones. Special emphasis will be placed on coping with negative emotions in conflict situations, and patients will process healthy conflict resolution skills.  Therapeutic Goals: Patient will identify two positive emotions or experiences to reflect on in order to balance out negative emotions:  Patient will label two or more emotions that they find the most difficult to experience:  Patient will be able to demonstrate positive conflict resolution skills through discussion or role plays:   Summary of Patient Progress: X   Therapeutic Modalities:   Cognitive Behavioral Therapy Feelings Identification Dialectical Behavioral Therapy   Nadara JONELLE Fam, LCSW

## 2024-05-03 NOTE — Plan of Care (Signed)
   Problem: Activity: Goal: Interest or engagement in activities will improve Outcome: Progressing

## 2024-05-03 NOTE — Progress Notes (Signed)
Patient declined morning vital signs.

## 2024-05-04 NOTE — Progress Notes (Signed)
 [facility] MD Progress Note  05/04/2024 6:32 PM Lauren Poole  MRN:  [id number]   Subjective:  Chart reviewed, case discussed in multidisciplinary meeting, patient seen during rounds.   9/18: On interview today patient is noted to be alert and laying in bed.  She declines to communicate with provider verbally.  Patient responds to questions during interview with a head nod or head shake.  Patient declined medication last night per nursing report.  She denies SI/HI/plan and denies hallucinations via shaking her head to indicate 'no' when asked.  She declined to answer questions regarding sleep and appetite.  When asked if tolerating medication patient nods head to indicate 'yes'.  Patient declined to participate further in interview.  9/17: On interview today patient is noted to be laying in bed with hand covering her face.  She continues to be withdrawn and minimizes symptoms.  She denies current SI/HI/plan and denies hallucinations.  She states she is tolerating initiation of Seroquel  well.  Patient declines to answer further questions.  9/16: On interview, patient is noted to be laying in bed.  Per nursing report, she has primarily been isolated to her room since her arrival on the unit.  She endorses passive SI without current intent or plan.  She denies HI/plan and denies hallucinations.  She reports she is sleeping and eating well.  Interviewer discussed treatment options with patient to include medication.  Patient states she prefers to be restarted on Seroquel , which she has taken in the past with good response.  Patient does not voice any other complaints or concerns today.   Sleep: Patient declined to answer  Appetite: Patient declined to answer  Past Psychiatric History: see h&P Family History:  Family History  Problem Relation Age of Onset   Schizophrenia Sister    Social History:  Social History   Substance and Sexual Activity  Alcohol Use Yes   Alcohol/week: 4.0 standard  drinks of alcohol   Types: 4 Glasses of wine per week     Social History   Substance and Sexual Activity  Drug Use Not Currently   Types: Marijuana    Social History   Socioeconomic History   Marital status: Single    Spouse name: Not on file   Number of children: Not on file   Years of education: Not on file   Highest education level: Not on file  Occupational History   Not on file  Tobacco Use   Smoking status: Every Day    Current packs/day: 0.50    Average packs/day: 0.5 packs/day for 6.6 years (3.3 ttl pk-yrs)    Types: Cigarettes    Start date: 10/2017   Smokeless tobacco: Never  Vaping Use   Vaping status: Never Used  Substance and Sexual Activity   Alcohol use: Yes    Alcohol/week: 4.0 standard drinks of alcohol    Types: 4 Glasses of wine per week   Drug use: Not Currently    Types: Marijuana   Sexual activity: Yes    Birth control/protection: None  Other Topics Concern   Not on file  Social History Narrative   Not on file   Social Drivers of Health   Financial Resource Strain: Not on file  Food Insecurity: Food Insecurity Present (04/30/2024)   Hunger Vital Sign    Worried About Running Out of Food in the Last Year: Sometimes true    Ran Out of Food in the Last Year: Sometimes true  Transportation Needs: Unmet Transportation Needs (04/30/2024)  PRAPARE - Administrator, Civil Service (Medical): Yes    Lack of Transportation (Non-Medical): Yes  Physical Activity: Not on file  Stress: Not on file  Social Connections: Not on file   Past Medical History:  Past Medical History:  Diagnosis Date   Bipolar 1 disorder (HCC)    Chlamydia    Gonorrhea     Past Surgical History:  Procedure Laterality Date   NO PAST SURGERIES      Current Medications: Current Facility-Administered Medications  Medication Dose Route Frequency Provider Last Rate Last Admin   acetaminophen  (TYLENOL ) tablet 650 mg  650 mg Oral Q6H PRN Motley-Mangrum, Jadeka A,  PMHNP       alum & mag hydroxide-simeth (MAALOX/MYLANTA) 200-200-20 MG/5ML suspension 30 mL  30 mL Oral Q4H PRN Motley-Mangrum, Jadeka A, PMHNP       haloperidol  (HALDOL ) tablet 5 mg  5 mg Oral TID PRN Motley-Mangrum, Jadeka A, PMHNP       And   diphenhydrAMINE  (BENADRYL ) capsule 50 mg  50 mg Oral TID PRN Motley-Mangrum, Jadeka A, PMHNP       haloperidol  lactate (HALDOL ) injection 5 mg  5 mg Intramuscular TID PRN Motley-Mangrum, Jadeka A, PMHNP       And   diphenhydrAMINE  (BENADRYL ) injection 50 mg  50 mg Intramuscular TID PRN Motley-Mangrum, Jadeka A, PMHNP       And   LORazepam  (ATIVAN ) injection 2 mg  2 mg Intramuscular TID PRN Motley-Mangrum, Jadeka A, PMHNP       haloperidol  lactate (HALDOL ) injection 10 mg  10 mg Intramuscular TID PRN Motley-Mangrum, Jadeka A, PMHNP       And   diphenhydrAMINE  (BENADRYL ) injection 50 mg  50 mg Intramuscular TID PRN Motley-Mangrum, Jadeka A, PMHNP       And   LORazepam  (ATIVAN ) injection 2 mg  2 mg Intramuscular TID PRN Motley-Mangrum, Jadeka A, PMHNP       magnesium  hydroxide (MILK OF MAGNESIA) suspension 30 mL  30 mL Oral Daily PRN Motley-Mangrum, Jadeka A, PMHNP       QUEtiapine  (SEROQUEL  XR) 24 hr tablet 200 mg  200 mg Oral QHS Josseline Reddin L, PA-C   200 mg at 05/02/24 2105   traZODone  (DESYREL ) tablet 50 mg  50 mg Oral QHS PRN Motley-Mangrum, Jadeka A, PMHNP   50 mg at 05/01/24 2108    Lab Results: No results found for this or any previous visit (from the past 48 hours).  Blood Alcohol level:  Lab Results  Component Value Date   [facility] <15 04/30/2024   ETH <15 04/09/2024    Metabolic Disorder Labs: Lab Results  Component Value Date   HGBA1C 5.1 10/31/2023   MPG 99.67 10/31/2023   No results found for: PROLACTIN Lab Results  Component Value Date   CHOL 136 10/31/2023   TRIG 75 10/31/2023   HDL 37 (L) 10/31/2023   CHOLHDL 3.7 10/31/2023   VLDL 15 10/31/2023   LDLCALC 84 10/31/2023    Physical Findings: AIMS:  , ,  ,  ,     CIWA:    COWS:      Psychiatric Specialty Exam:  Presentation  General Appearance:  Casual  Eye Contact: Poor  Speech: Unable to assess  Speech Volume: Unable to assess   Mood and Affect  Mood: Depressed; Hopeless  Affect: Congruent   Thought Process  Thought Processes: Coherent  Descriptions of Associations:Intact  Orientation:Full (Time, Place and Person)  Thought Content:Logical  Hallucinations: None  Ideas of Reference:None  Suicidal Thoughts: None currently, though endorsed passive SI yesterday  Homicidal Thoughts: None currently   Sensorium  Memory: Immediate Fair; Recent Fair; Remote Fair  Judgment: Poor  Insight: Poor   Executive Functions  Concentration: Fair  Attention Span: Fair  Recall: [employer] of Knowledge: Fair  Language: Fair   Psychomotor Activity  Psychomotor Activity: No data recorded  Musculoskeletal: Strength & Muscle Tone: within normal limits Gait & Station: normal Assets  Assets: Communication Skills    Physical Exam: Physical Exam HENT:     Nose: Nose normal.     Mouth/Throat:     Mouth: Mucous membranes are moist.  Pulmonary:     Effort: Pulmonary effort is normal.  Neurological:     Mental Status: She is alert.  Psychiatric:        Attention and Perception: Attention normal.        Mood and Affect: Affect is flat.        Speech: Speech normal.        Behavior: Behavior is withdrawn. Behavior is not agitated, aggressive, hyperactive or combative.        Thought Content: Thought content does not include homicidal or suicidal ideation. Thought content does not include homicidal or suicidal plan.    Review of Systems  Psychiatric/Behavioral:  Negative for hallucinations and suicidal ideas.   All other systems reviewed and are negative.  Blood pressure 123/84, pulse 79, temperature 97.7 F (36.5 C), resp. rate (!) 22, height 5' 4 (1.626 m), weight 109.3 kg, SpO2 100%. Body mass  index is 41.37 kg/m.  Diagnosis: Principal Problem:   Bipolar 1 disorder (HCC)   PLAN: Safety and Monitoring:  -- Voluntary admission to inpatient psychiatric unit for safety, stabilization and treatment  -- Daily contact with patient to assess and evaluate symptoms and progress in treatment  -- Patient's case to be discussed in multi-disciplinary team meeting  -- Observation Level : q15 minute checks  -- Vital signs:  q12 hours  -- Precautions: suicide, elopement, and assault -- Encouraged patient to participate in unit milieu and in scheduled group therapies  2. Psychiatric Diagnoses and Treatment:  Bipolar disorder  Continue Seroquel  XR 200 mg at bedtime                The risks/benefits/side-effects/alternatives to this medication were discussed in detail with the patient and time was given for questions. The patient consents to medication trial. -- Metabolic profile and EKG monitoring obtained while on an atypical antipsychotic  -- Encouraged patient to participate in unit milieu and in scheduled group therapies                    3. Medical Issues Being Addressed:  No medical issues identified at this time.   4. Discharge Planning:   -- Social work and case management to assist with discharge planning and identification of hospital follow-up needs prior to discharge  -- Estimated LOS: 3-4 days  Camelia LITTIE Lukes, PA-C 05/04/2024, 6:32 PM
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
## 2024-05-04 NOTE — Progress Notes (Signed)
 Lauren Poole - [place] MD Progress Note  05/03/2024 1:51 PM Lauren Poole  MRN:  [id number]   Subjective:  Chart reviewed, case discussed in multidisciplinary meeting, patient seen during rounds.   9/17: On interview today patient is noted to be laying in bed with hand covering her face.  She continues to be withdrawn and minimizes symptoms.  She denies current SI/HI/plan and denies hallucinations.  She states she is tolerating initiation of Seroquel  well.  Patient declines to answer further questions.  9/16: On interview, patient is noted to be laying in bed.  Per nursing report, she has primarily been isolated to her room since her arrival on the unit.  She endorses passive SI without current intent or plan.  She denies HI/plan and denies hallucinations.  She reports she is sleeping and eating well.  Interviewer discussed treatment options with patient to include medication.  Patient states she prefers to be restarted on Seroquel , which she has taken in the past with good response.  Patient does not voice any other complaints or concerns today.   Sleep: Good  Appetite:  Good  Past Psychiatric History: see h&P Family History:  Family History  Problem Relation Age of Onset   Schizophrenia Sister    Social History:  Social History   Substance and Sexual Activity  Alcohol Use Yes   Alcohol/week: 4.0 standard drinks of alcohol   Types: 4 Glasses of wine per week     Social History   Substance and Sexual Activity  Drug Use Not Currently   Types: Marijuana    Social History   Socioeconomic History   Marital status: Single    Spouse name: Not on file   Number of children: Not on file   Years of education: Not on file   Highest education level: Not on file  Occupational History   Not on file  Tobacco Use   Smoking status: Every Day    Current packs/day: 0.50    Average packs/day: 0.5 packs/day for 6.6 years (3.3 ttl pk-yrs)    Types: Cigarettes    Start date: 10/2017   Smokeless  tobacco: Never  Vaping Use   Vaping status: Never Used  Substance and Sexual Activity   Alcohol use: Yes    Alcohol/week: 4.0 standard drinks of alcohol    Types: 4 Glasses of wine per week   Drug use: Not Currently    Types: Marijuana   Sexual activity: Yes    Birth control/protection: None  Other Topics Concern   Not on file  Social History Narrative   Not on file   Social Drivers of Health   Financial Resource Strain: Not on file  Food Insecurity: Food Insecurity Present (04/30/2024)   Hunger Vital Sign    Worried About Running Out of Food in the Last Year: Sometimes true    Ran Out of Food in the Last Year: Sometimes true  Transportation Needs: Unmet Transportation Needs (04/30/2024)   PRAPARE - Administrator, Civil Service (Medical): Yes    Lack of Transportation (Non-Medical): Yes  Physical Activity: Not on file  Stress: Not on file  Social Connections: Not on file   Past Medical History:  Past Medical History:  Diagnosis Date   Bipolar 1 disorder (HCC)    Chlamydia    Gonorrhea     Past Surgical History:  Procedure Laterality Date   NO PAST SURGERIES      Current Medications: Current Facility-Administered Medications  Medication Dose Route Frequency Provider Last Rate  Last Admin   acetaminophen  (TYLENOL ) tablet 650 mg  650 mg Oral Q6H PRN Motley-Mangrum, Jadeka A, PMHNP       alum & mag hydroxide-simeth (MAALOX/MYLANTA) 200-200-20 MG/5ML suspension 30 mL  30 mL Oral Q4H PRN Motley-Mangrum, Jadeka A, PMHNP       haloperidol  (HALDOL ) tablet 5 mg  5 mg Oral TID PRN Motley-Mangrum, Jadeka A, PMHNP       And   diphenhydrAMINE  (BENADRYL ) capsule 50 mg  50 mg Oral TID PRN Motley-Mangrum, Jadeka A, PMHNP       haloperidol  lactate (HALDOL ) injection 5 mg  5 mg Intramuscular TID PRN Motley-Mangrum, Jadeka A, PMHNP       And   diphenhydrAMINE  (BENADRYL ) injection 50 mg  50 mg Intramuscular TID PRN Motley-Mangrum, Jadeka A, PMHNP       And   LORazepam   (ATIVAN ) injection 2 mg  2 mg Intramuscular TID PRN Motley-Mangrum, Jadeka A, PMHNP       haloperidol  lactate (HALDOL ) injection 10 mg  10 mg Intramuscular TID PRN Motley-Mangrum, Jadeka A, PMHNP       And   diphenhydrAMINE  (BENADRYL ) injection 50 mg  50 mg Intramuscular TID PRN Motley-Mangrum, Jadeka A, PMHNP       And   LORazepam  (ATIVAN ) injection 2 mg  2 mg Intramuscular TID PRN Motley-Mangrum, Jadeka A, PMHNP       magnesium  hydroxide (MILK OF MAGNESIA) suspension 30 mL  30 mL Oral Daily PRN Motley-Mangrum, Jadeka A, PMHNP       QUEtiapine  (SEROQUEL  XR) 24 hr tablet 200 mg  200 mg Oral QHS Shreya Lacasse L, PA-C   200 mg at 05/02/24 2105   traZODone  (DESYREL ) tablet 50 mg  50 mg Oral QHS PRN Motley-Mangrum, Jadeka A, PMHNP   50 mg at 05/01/24 2108    Lab Results: No results found for this or any previous visit (from the past 48 hours).  Blood Alcohol level:  Lab Results  Component Value Date   [facility] <15 04/30/2024   ETH <15 04/09/2024    Metabolic Disorder Labs: Lab Results  Component Value Date   HGBA1C 5.1 10/31/2023   MPG 99.67 10/31/2023   No results found for: PROLACTIN Lab Results  Component Value Date   CHOL 136 10/31/2023   TRIG 75 10/31/2023   HDL 37 (L) 10/31/2023   CHOLHDL 3.7 10/31/2023   VLDL 15 10/31/2023   LDLCALC 84 10/31/2023    Physical Findings: AIMS:  , ,  ,  ,    CIWA:    COWS:      Psychiatric Specialty Exam:  Presentation  General Appearance:  Casual  Eye Contact: Poor  Speech: Clear and Coherent  Speech Volume: Normal    Mood and Affect  Mood: Depressed; Hopeless  Affect: Congruent   Thought Process  Thought Processes: Coherent  Descriptions of Associations:Intact  Orientation:Full (Time, Place and Person)  Thought Content:Logical  Hallucinations: None  Ideas of Reference:None  Suicidal Thoughts: None currently, though endorsed passive SI yesterday  Homicidal Thoughts: None currently   Sensorium   Memory: Immediate Fair; Recent Fair; Remote Fair  Judgment: Poor  Insight: Poor   Executive Functions  Concentration: Fair  Attention Span: Fair  Recall: [employer] of Knowledge: Fair  Language: Fair   Psychomotor Activity  Psychomotor Activity: No data recorded  Musculoskeletal: Strength & Muscle Tone: within normal limits Gait & Station: normal Assets  Assets: Communication Skills    Physical Exam: Physical Exam HENT:  Nose: Nose normal.     Mouth/Throat:     Mouth: Mucous membranes are moist.  Pulmonary:     Effort: Pulmonary effort is normal.  Neurological:     Mental Status: She is alert.  Psychiatric:        Attention and Perception: Attention normal.        Mood and Affect: Mood is depressed.        Speech: Speech normal.        Behavior: Behavior is withdrawn. Behavior is not agitated, aggressive, hyperactive or combative.        Thought Content: Thought content does not include homicidal or suicidal ideation. Thought content does not include homicidal or suicidal plan.    Review of Systems  Psychiatric/Behavioral:  Positive for depression. Negative for hallucinations and suicidal ideas. The patient does not have insomnia.   All other systems reviewed and are negative.  Blood pressure 110/80, pulse 77, temperature 98.4 F (36.9 C), temperature source Oral, resp. rate (!) 22, height 5' 4 (1.626 m), weight 109.3 kg, SpO2 100%. Body mass index is 41.37 kg/m.  Diagnosis: Principal Problem:   Bipolar 1 disorder (HCC)   PLAN: Safety and Monitoring:  -- Voluntary admission to inpatient psychiatric unit for safety, stabilization and treatment  -- Daily contact with patient to assess and evaluate symptoms and progress in treatment  -- Patient's case to be discussed in multi-disciplinary team meeting  -- Observation Level : q15 minute checks  -- Vital signs:  q12 hours  -- Precautions: suicide, elopement, and assault -- Encouraged  patient to participate in unit milieu and in scheduled group therapies  2. Psychiatric Diagnoses and Treatment:  Bipolar disorder  Continue Seroquel  XR 200 mg at bedtime        3. Medical Issues Being Addressed:     4. Discharge Planning:   -- Social work and case management to assist with discharge planning and identification of hospital follow-up needs prior to discharge  -- Estimated LOS: 3-4 days  Camelia LITTIE Lukes, PA-C 05/04/2024, 1:51 PM

## 2024-05-04 NOTE — Plan of Care (Signed)

## 2024-05-04 NOTE — Progress Notes (Signed)
   05/04/24 1000  Psych Admission Type (Psych Patients Only)  Admission Status Voluntary  Psychosocial Assessment  Patient Complaints Hopelessness;Anxiety  Eye Contact Brief  Facial Expression Flat  Affect Flat;Apathetic  Speech Soft  Interaction Isolative  Motor Activity Slow  Appearance/Hygiene Unremarkable  Behavior Characteristics Cooperative  Mood Apathetic  Thought Process  Coherency WDL  Content WDL  Delusions None reported or observed  Perception WDL  Hallucination None reported or observed  Judgment Poor  Confusion None  Danger to Self  Current suicidal ideation? Denies  Danger to Others  Danger to Others None reported or observed

## 2024-05-04 NOTE — Group Note (Signed)
 Recreation Therapy Group Note   Group Topic:Animal Assisted Therapy   Group Date: 05/04/2024 Start Time: 1000 End Time: 1035 Facilitators: Celestia Jeoffrey BRAVO, LRT, CTRS Location: Courtyard  Group Description: AAA. Animal-Assisted Activity provides opportunities for motivational, educational, therapeutic and/or recreational benefits to enhance quality of life. Selinda Geophysicist/field seismologist) and Rollo (dog) visited the unit to interact with patients.   Goal Areas Addressed:  Reduced anxiety and stress Improved mood Increased social interaction Enhanced communication skills Reduced loneliness and isolation Improved emotional regulation   Affect/Mood: N/A   Participation Level: Did not attend    Clinical Observations/Individualized Feedback: Patient did not attend group.   Plan: Continue to engage patient in RT group sessions 2-3x/week.   Jeoffrey BRAVO Celestia, LRT, CTRS 05/04/2024 1:06 PM

## 2024-05-04 NOTE — Group Note (Signed)
 [facility] LCSW Group Therapy Note   Group Date: 05/04/2024 Start Time: 1305 End Time: 1415   Type of Therapy/Topic:  Group Therapy:  Emotion Regulation  Participation Level:  Minimal   Mood: Withdrawn   Description of Group:    The purpose of this group is to assist patients in learning to regulate negative emotions and experience positive emotions. Patients will be guided to discuss ways in which they have been vulnerable to their negative emotions. These vulnerabilities will be juxtaposed with experiences of positive emotions or situations, and patients challenged to use positive emotions to combat negative ones. Special emphasis will be placed on coping with negative emotions in conflict situations, and patients will process healthy conflict resolution skills.  Therapeutic Goals: Patient will identify two positive emotions or experiences to reflect on in order to balance out negative emotions:  Patient will label two or more emotions that they find the most difficult to experience:  Patient will be able to demonstrate positive conflict resolution skills through discussion or role plays:   Summary of Patient Progress:  During group, patient and group explored the ways in which our thoughts can impact our feelings which impacts our behaviors. Group along with facilitator completed a thermometer activity where different areas of life were explored. Participants were asked to notate in which zone these areas exist in on their personal thermometers. The group then discussed coping skills, and safety plans to help better prepare for potential stressors and learn to better emotionally regulate.     Therapeutic Modalities:   Cognitive Behavioral Therapy Feelings Identification Dialectical Behavioral Therapy   Alveta CHRISTELLA Kerns, LCSW

## 2024-05-04 NOTE — Plan of Care (Signed)
   Problem: Education: Goal: Emotional status will improve Outcome: Progressing Goal: Mental status will improve Outcome: Progressing Goal: Verbalization of understanding the information provided will improve Outcome: Progressing   Problem: Activity: Goal: Interest or engagement in activities will improve Outcome: Progressing

## 2024-05-04 NOTE — Group Note (Signed)
 Date:  05/04/2024 Time:  4:02 PM  Group Topic/Focus:  Goals Group:   The focus of this group is to help patients establish daily goals to achieve during treatment and discuss how the patient can incorporate goal setting into their daily lives to aide in recovery.    Participation Level:  Did Not Attend   Skippy LITTIE Bennett 05/04/2024, 4:02 PM

## 2024-05-04 NOTE — Group Note (Deleted)
 [facility] LCSW Group Therapy Note   Group Date: 05/04/2024 Start Time: 1305 End Time: 1422   Type of Therapy and Topic: Group Therapy: Avoiding Self-Sabotaging and Enabling Behaviors  Participation Level: {[facility] PARTICIPATION OZCZO:[id number]}  Mood:  Description of Group:  In this group, patients will learn how to identify obstacles, self-sabotaging and enabling behaviors, as well as: what are they, why do we do them and what needs these behaviors meet. Discuss unhealthy relationships and how to have positive healthy boundaries with those that sabotage and enable. Explore aspects of self-sabotage and enabling in yourself and how to limit these self-destructive behaviors in everyday life.   Therapeutic Goals: 1. Patient will identify one obstacle that relates to self-sabotage and enabling behaviors 2. Patient will identify one personal self-sabotaging or enabling behavior they did prior to admission 3. Patient will state a plan to change the above identified behavior 4. Patient will demonstrate ability to communicate their needs through discussion and/or role play.    Summary of Patient Progress:   ***   Therapeutic Modalities:  Cognitive Behavioral Therapy Person-Centered Therapy Motivational Interviewing    Sejla Marzano M Ala Kratz, LCSW

## 2024-05-04 NOTE — Progress Notes (Signed)
   05/04/24 2027  Psych Admission Type (Psych Patients Only)  Admission Status Voluntary  Psychosocial Assessment  Patient Complaints Hopelessness  Eye Contact Brief  Facial Expression Flat  Affect Apathetic  Speech Soft  Interaction Isolative  Motor Activity Slow  Appearance/Hygiene Unremarkable  Behavior Characteristics Cooperative  Mood Apathetic  Thought Process  Coherency WDL  Content WDL  Delusions None reported or observed  Perception WDL  Hallucination None reported or observed  Judgment Poor  Confusion None  Danger to Self  Current suicidal ideation? Denies  Agreement Not to Harm Self Yes  Description of Agreement verbal  Danger to Others  Danger to Others None reported or observed

## 2024-05-05 NOTE — Group Note (Signed)
 Recreation Therapy Group Note   Group Topic:Leisure Education  Group Date: 05/05/2024 Start Time: 1300 End Time: 1400 Facilitators: Celestia Jeoffrey BRAVO, LRT, CTRS Location: Craft Room  Group Description: Leisure. Patients were given the option to choose from journaling, coloring, drawing, making origami, playing with playdoh, listening to music or singing karaoke. LRT and pts discussed the meaning of leisure, the importance of participating in leisure during their free time/when they're outside of the hospital, as well as how our leisure interests can also serve as coping skills.   Goal Area(s) Addressed:  Patient will identify a current leisure interest.  Patient will learn the definition of "leisure". Patient will practice making a positive decision. Patient will have the opportunity to try a new leisure activity. Patient will communicate with peers and LRT.    Affect/Mood: Appropriate   Participation Level: Active and Engaged   Participation Quality: Independent   Behavior: Appropriate, Calm, and Cooperative   Speech/Thought Process: Coherent   Insight: Good   Judgement: Good   Modes of Intervention: Clarification, Education, Exploration, and Music   Patient Response to Interventions:  Attentive, Engaged, Interested , and Receptive   Education Outcome:  Acknowledges education   Clinical Observations/Individualized Feedback: Lauren Poole was active in their participation of session activities and group discussion. Pt identified sleep and walk as things she does in her free time. Pt chose to color with oil pastels and make origami while in group.    Plan: Continue to engage patient in RT group sessions 2-3x/week.   Jeoffrey BRAVO Celestia, LRT, CTRS 05/05/2024 2:06 PM

## 2024-05-05 NOTE — Progress Notes (Signed)
 [facility] MD Progress Note  05/05/2024 5:30 PM Lauren Poole  MRN:  [id number]   Subjective:  Chart reviewed, case discussed in multidisciplinary meeting, patient seen during rounds.   9/19: On interview today patient is noted to be seated up in bed looking towards the window.  She is more engaged in interview today than she has been in recent days.   She was compliant with Seroquel  dose last night, and states she feels that it is working.  She reports tolerating medication well without adverse effects.  She is sleeping well and reports appetite as normal.  She denies SI/HI/plan and denies hallucinations.  She rates depression as a 4 out of 10 and anxiety as a 0 out of 10 today.  She does not voice any concerns or complaints today.  9/18: On interview today patient is noted to be alert and laying in bed.  She declines to communicate with provider verbally.  Patient responds to questions during interview with a head nod or head shake.  Patient declined medication last night per nursing report.  She denies SI/HI/plan and denies hallucinations via shaking her head to indicate 'no' when asked.  She declined to answer questions regarding sleep and appetite.  When asked if tolerating medication patient nods head to indicate 'yes'.  Patient declined to participate further in interview.  9/17: On interview today patient is noted to be laying in bed with hand covering her face.  She continues to be withdrawn and minimizes symptoms.  She denies current SI/HI/plan and denies hallucinations.  She states she is tolerating initiation of Seroquel  well.  Patient declines to answer further questions.  9/16: On interview, patient is noted to be laying in bed.  Per nursing report, she has primarily been isolated to her room since her arrival on the unit.  She endorses passive SI without current intent or plan.  She denies HI/plan and denies hallucinations.  She reports she is sleeping and eating well.  Interviewer  discussed treatment options with patient to include medication.  Patient states she prefers to be restarted on Seroquel , which she has taken in the past with good response.  Patient does not voice any other complaints or concerns today.   Sleep: Patient declined to answer  Appetite: Patient declined to answer  Past Psychiatric History: see h&P Family History:  Family History  Problem Relation Age of Onset   Schizophrenia Sister    Social History:  Social History   Substance and Sexual Activity  Alcohol Use Yes   Alcohol/week: 4.0 standard drinks of alcohol   Types: 4 Glasses of wine per week     Social History   Substance and Sexual Activity  Drug Use Not Currently   Types: Marijuana    Social History   Socioeconomic History   Marital status: Single    Spouse name: Not on file   Number of children: Not on file   Years of education: Not on file   Highest education level: Not on file  Occupational History   Not on file  Tobacco Use   Smoking status: Every Day    Current packs/day: 0.50    Average packs/day: 0.5 packs/day for 6.6 years (3.3 ttl pk-yrs)    Types: Cigarettes    Start date: 10/2017   Smokeless tobacco: Never  Vaping Use   Vaping status: Never Used  Substance and Sexual Activity   Alcohol use: Yes    Alcohol/week: 4.0 standard drinks of alcohol    Types: 4 Glasses  of wine per week   Drug use: Not Currently    Types: Marijuana   Sexual activity: Yes    Birth control/protection: None  Other Topics Concern   Not on file  Social History Narrative   Not on file   Social Drivers of Health   Financial Resource Strain: Not on file  Food Insecurity: Food Insecurity Present (04/30/2024)   Hunger Vital Sign    Worried About Running Out of Food in the Last Year: Sometimes true    Ran Out of Food in the Last Year: Sometimes true  Transportation Needs: Unmet Transportation Needs (04/30/2024)   PRAPARE - Administrator, Civil Service (Medical):  Yes    Lack of Transportation (Non-Medical): Yes  Physical Activity: Not on file  Stress: Not on file  Social Connections: Not on file   Past Medical History:  Past Medical History:  Diagnosis Date   Bipolar 1 disorder (HCC)    Chlamydia    Gonorrhea     Past Surgical History:  Procedure Laterality Date   NO PAST SURGERIES      Current Medications: Current Facility-Administered Medications  Medication Dose Route Frequency Provider Last Rate Last Admin   acetaminophen  (TYLENOL ) tablet 650 mg  650 mg Oral Q6H PRN Motley-Mangrum, Jadeka A, PMHNP       alum & mag hydroxide-simeth (MAALOX/MYLANTA) 200-200-20 MG/5ML suspension 30 mL  30 mL Oral Q4H PRN Motley-Mangrum, Jadeka A, PMHNP       haloperidol  (HALDOL ) tablet 5 mg  5 mg Oral TID PRN Motley-Mangrum, Jadeka A, PMHNP       And   diphenhydrAMINE  (BENADRYL ) capsule 50 mg  50 mg Oral TID PRN Motley-Mangrum, Jadeka A, PMHNP       haloperidol  lactate (HALDOL ) injection 5 mg  5 mg Intramuscular TID PRN Motley-Mangrum, Jadeka A, PMHNP       And   diphenhydrAMINE  (BENADRYL ) injection 50 mg  50 mg Intramuscular TID PRN Motley-Mangrum, Jadeka A, PMHNP       And   LORazepam  (ATIVAN ) injection 2 mg  2 mg Intramuscular TID PRN Motley-Mangrum, Jadeka A, PMHNP       haloperidol  lactate (HALDOL ) injection 10 mg  10 mg Intramuscular TID PRN Motley-Mangrum, Jadeka A, PMHNP       And   diphenhydrAMINE  (BENADRYL ) injection 50 mg  50 mg Intramuscular TID PRN Motley-Mangrum, Jadeka A, PMHNP       And   LORazepam  (ATIVAN ) injection 2 mg  2 mg Intramuscular TID PRN Motley-Mangrum, Jadeka A, PMHNP       magnesium  hydroxide (MILK OF MAGNESIA) suspension 30 mL  30 mL Oral Daily PRN Motley-Mangrum, Jadeka A, PMHNP       QUEtiapine  (SEROQUEL  XR) 24 hr tablet 200 mg  200 mg Oral QHS Lovelyn Sheeran L, PA-C   200 mg at 05/04/24 2110   traZODone  (DESYREL ) tablet 50 mg  50 mg Oral QHS PRN Motley-Mangrum, Jadeka A, PMHNP   50 mg at 05/01/24 2108    Lab  Results: No results found for this or any previous visit (from the past 48 hours).  Blood Alcohol level:  Lab Results  Component Value Date   [facility] <15 04/30/2024   ETH <15 04/09/2024    Metabolic Disorder Labs: Lab Results  Component Value Date   HGBA1C 5.1 10/31/2023   MPG 99.67 10/31/2023   No results found for: PROLACTIN Lab Results  Component Value Date   CHOL 136 10/31/2023   TRIG 75 10/31/2023  HDL 37 (L) 10/31/2023   CHOLHDL 3.7 10/31/2023   VLDL 15 10/31/2023   LDLCALC 84 10/31/2023    Physical Findings: AIMS:  , ,  ,  ,    CIWA:    COWS:      Psychiatric Specialty Exam:  Presentation  General Appearance:  Casual  Eye Contact: Poor  Speech: Unable to assess  Speech Volume: Unable to assess   Mood and Affect  Mood: Depressed; Hopeless  Affect: Congruent   Thought Process  Thought Processes: Coherent  Descriptions of Associations:Intact  Orientation:Full (Time, Place and Person)  Thought Content:Logical  Hallucinations: None  Ideas of Reference:None  Suicidal Thoughts: None currently, though endorsed passive SI yesterday  Homicidal Thoughts: None currently   Sensorium  Memory: Immediate Fair; Recent Fair; Remote Fair  Judgment: Poor  Insight: Poor   Executive Functions  Concentration: Fair  Attention Span: Fair  Recall: [employer] of Knowledge: Fair  Language: Fair   Psychomotor Activity  Psychomotor Activity: No data recorded  Musculoskeletal: Strength & Muscle Tone: within normal limits Gait & Station: normal Assets  Assets: Communication Skills    Physical Exam: Physical Exam HENT:     Nose: Nose normal.     Mouth/Throat:     Mouth: Mucous membranes are moist.  Pulmonary:     Effort: Pulmonary effort is normal.  Neurological:     Mental Status: She is alert.  Psychiatric:        Attention and Perception: Attention normal.        Mood and Affect: Mood is depressed.        Speech:  Speech normal.        Behavior: Behavior is withdrawn. Behavior is not agitated, aggressive, hyperactive or combative. Behavior is cooperative.        Thought Content: Thought content does not include homicidal or suicidal ideation. Thought content does not include homicidal or suicidal plan.    Review of Systems  Psychiatric/Behavioral:  Positive for depression. Negative for hallucinations and suicidal ideas. The patient is not nervous/anxious and does not have insomnia.   All other systems reviewed and are negative.  Blood pressure 97/74, pulse 66, temperature (!) 97.2 F (36.2 C), resp. rate 19, height 5' 4 (1.626 m), weight 109.3 kg, SpO2 99%. Body mass index is 41.37 kg/m.  Diagnosis: Principal Problem:   Bipolar 1 disorder (HCC)   PLAN: Safety and Monitoring:  -- Voluntary admission to inpatient psychiatric unit for safety, stabilization and treatment  -- Daily contact with patient to assess and evaluate symptoms and progress in treatment  -- Patient's case to be discussed in multi-disciplinary team meeting  -- Observation Level : q15 minute checks  -- Vital signs:  q12 hours  -- Precautions: suicide, elopement, and assault -- Encouraged patient to participate in unit milieu and in scheduled group therapies  2. Psychiatric Diagnoses and Treatment:  Bipolar disorder  Continue Seroquel  XR 200 mg at bedtime                The risks/benefits/side-effects/alternatives to this medication were discussed in detail with the patient and time was given for questions. The patient consents to medication trial. -- Metabolic profile and EKG monitoring obtained while on an atypical antipsychotic  -- Encouraged patient to participate in unit milieu and in scheduled group therapies                    3. Medical Issues Being Addressed:  No medical issues identified at  this time.   4. Discharge Planning:             -- Likely Tuesday  -- Social work and case management to assist with  discharge planning and identification of hospital follow-up needs prior to discharge  -- Estimated LOS: 1-2 weeks  Camelia LITTIE Lukes, PA-C 05/05/2024, 5:30 PM

## 2024-05-05 NOTE — Plan of Care (Signed)
  Problem: Education: Goal: Mental status will improve Outcome: Progressing   

## 2024-05-05 NOTE — Progress Notes (Signed)
   05/05/24 1000  Psych Admission Type (Psych Patients Only)  Admission Status Voluntary  Psychosocial Assessment  Patient Complaints Depression (4/10)  Eye Contact Brief  Facial Expression Flat  Affect Apathetic  Speech Soft  Interaction Isolative  Motor Activity Slow  Appearance/Hygiene Unremarkable  Behavior Characteristics Cooperative  Mood Apathetic  Thought Process  Coherency WDL  Content WDL  Delusions None reported or observed  Perception WDL  Hallucination None reported or observed  Judgment Poor  Confusion None  Danger to Self  Current suicidal ideation? Denies  Danger to Others  Danger to Others None reported or observed

## 2024-05-06 DIAGNOSIS — F319 Bipolar disorder, unspecified: Secondary | ICD-10-CM | POA: Diagnosis not present

## 2024-05-06 MED ORDER — ARIPIPRAZOLE 5 MG PO TABS
5.0000 mg | ORAL_TABLET | Freq: Every day | ORAL | Status: DC
  Administered 2024-05-08: 5 mg via ORAL
  Filled 2024-05-06: qty 1

## 2024-05-06 MED ORDER — LITHIUM CARBONATE ER 300 MG PO TBCR
300.0000 mg | EXTENDED_RELEASE_TABLET | Freq: Two times a day (BID) | ORAL | Status: DC
  Administered 2024-05-07 – 2024-05-09 (×4): 300 mg via ORAL
  Filled 2024-05-06 (×4): qty 1

## 2024-05-06 NOTE — Progress Notes (Signed)
 [facility] MD Progress Note  05/05/2024 9:07 PM Lauren Poole  MRN:  [id number]  33 year old female who presented to the ED via EMS following a suicide attempt by overdose on 04/30/24. She reported ingesting approximately 30 Aleve pills. After ingestion, the patient did not experience vomiting or other acute physical symptoms. She reports that initially she wanted to not wake up, but later became fearful and contacted a friend, who encouraged her to seek care in the ED. The patient reports she has struggled with chronic suicidal ideation for about 1 year.  She states things have been hard but declines to elaborate on specific triggers.   Subjective:  Chart reviewed, case discussed in multidisciplinary meeting, patient seen during rounds.    05/06/2024:   per staff report patient has refused to take the Seroquel  at night yesterday.  Patient is still coming out and staying in her home.  Patient on interview reports that she came suicide attempt and reports that she had 1 previous suicide attempt this year.  Patient continues to report that she is depressed.  Patient reports that it is end of road for her.  Patient reports that she does not have access to gun but if she has done she would just get it over with.  Patient remained vague but reports that she has ongoing financial, family, life stressors.  Patient reports history consistent with bipolar 1   With manic episode lasting for 1 month but reports that these days she is not  having manic symptoms and mostly getting depressed.   9/19: On interview today patient is noted to be seated up in bed looking towards the window.  She is more engaged in interview today than she has been in recent days.   She was compliant with Seroquel  dose last night, and states she feels that it is working.  She reports tolerating medication well without adverse effects.  She is sleeping well and reports appetite as normal.  She denies SI/HI/plan and denies hallucinations.   She rates depression as a 4 out of 10 and anxiety as a 0 out of 10 today.  She does not voice any concerns or complaints today.  9/18: On interview today patient is noted to be alert and laying in bed.  She declines to communicate with provider verbally.  Patient responds to questions during interview with a head nod or head shake.  Patient declined medication last night per nursing report.  She denies SI/HI/plan and denies hallucinations via shaking her head to indicate 'no' when asked.  She declined to answer questions regarding sleep and appetite.  When asked if tolerating medication patient nods head to indicate 'yes'.  Patient declined to participate further in interview.  9/17: On interview today patient is noted to be laying in bed with hand covering her face.  She continues to be withdrawn and minimizes symptoms.  She denies current SI/HI/plan and denies hallucinations.  She states she is tolerating initiation of Seroquel  well.  Patient declines to answer further questions.  9/16: On interview, patient is noted to be laying in bed.  Per nursing report, she has primarily been isolated to her room since her arrival on the unit.  She endorses passive SI without current intent or plan.  She denies HI/plan and denies hallucinations.  She reports she is sleeping and eating well.  Interviewer discussed treatment options with patient to include medication.  Patient states she prefers to be restarted on Seroquel , which she has taken in the past with good  response.  Patient does not voice any other complaints or concerns today.   Sleep: Patient declined to answer  Appetite: Patient declined to answer  Past Psychiatric History: see h&P Family History:  Family History  Problem Relation Age of Onset   Schizophrenia Sister    Social History:  Social History   Substance and Sexual Activity  Alcohol Use Yes   Alcohol/week: 4.0 standard drinks of alcohol   Types: 4 Glasses of wine per week      Social History   Substance and Sexual Activity  Drug Use Not Currently   Types: Marijuana    Social History   Socioeconomic History   Marital status: Single    Spouse name: Not on file   Number of children: Not on file   Years of education: Not on file   Highest education level: Not on file  Occupational History   Not on file  Tobacco Use   Smoking status: Every Day    Current packs/day: 0.50    Average packs/day: 0.5 packs/day for 6.6 years (3.3 ttl pk-yrs)    Types: Cigarettes    Start date: 10/2017   Smokeless tobacco: Never  Vaping Use   Vaping status: Never Used  Substance and Sexual Activity   Alcohol use: Yes    Alcohol/week: 4.0 standard drinks of alcohol    Types: 4 Glasses of wine per week   Drug use: Not Currently    Types: Marijuana   Sexual activity: Yes    Birth control/protection: None  Other Topics Concern   Not on file  Social History Narrative   Not on file   Social Drivers of Health   Financial Resource Strain: Not on file  Food Insecurity: Food Insecurity Present (04/30/2024)   Hunger Vital Sign    Worried About Running Out of Food in the Last Year: Sometimes true    Ran Out of Food in the Last Year: Sometimes true  Transportation Needs: Unmet Transportation Needs (04/30/2024)   PRAPARE - Administrator, Civil Service (Medical): Yes    Lack of Transportation (Non-Medical): Yes  Physical Activity: Not on file  Stress: Not on file  Social Connections: Not on file   Past Medical History:  Past Medical History:  Diagnosis Date   Bipolar 1 disorder (HCC)    Chlamydia    Gonorrhea     Past Surgical History:  Procedure Laterality Date   NO PAST SURGERIES      Current Medications: Current Facility-Administered Medications  Medication Dose Route Frequency Provider Last Rate Last Admin   acetaminophen  (TYLENOL ) tablet 650 mg  650 mg Oral Q6H PRN Motley-Mangrum, Jadeka A, PMHNP       alum & mag hydroxide-simeth (MAALOX/MYLANTA)  200-200-20 MG/5ML suspension 30 mL  30 mL Oral Q4H PRN Motley-Mangrum, Jadeka A, PMHNP       haloperidol  (HALDOL ) tablet 5 mg  5 mg Oral TID PRN Motley-Mangrum, Jadeka A, PMHNP       And   diphenhydrAMINE  (BENADRYL ) capsule 50 mg  50 mg Oral TID PRN Motley-Mangrum, Jadeka A, PMHNP       haloperidol  lactate (HALDOL ) injection 5 mg  5 mg Intramuscular TID PRN Motley-Mangrum, Jadeka A, PMHNP       And   diphenhydrAMINE  (BENADRYL ) injection 50 mg  50 mg Intramuscular TID PRN Motley-Mangrum, Jadeka A, PMHNP       And   LORazepam  (ATIVAN ) injection 2 mg  2 mg Intramuscular TID PRN Motley-Mangrum, Jadeka A, PMHNP  haloperidol  lactate (HALDOL ) injection 10 mg  10 mg Intramuscular TID PRN Motley-Mangrum, Jadeka A, PMHNP       And   diphenhydrAMINE  (BENADRYL ) injection 50 mg  50 mg Intramuscular TID PRN Motley-Mangrum, Jadeka A, PMHNP       And   LORazepam  (ATIVAN ) injection 2 mg  2 mg Intramuscular TID PRN Motley-Mangrum, Jadeka A, PMHNP       magnesium  hydroxide (MILK OF MAGNESIA) suspension 30 mL  30 mL Oral Daily PRN Motley-Mangrum, Jadeka A, PMHNP       QUEtiapine  (SEROQUEL  XR) 24 hr tablet 200 mg  200 mg Oral QHS Hunter, Crystal L, PA-C   200 mg at 05/06/24 2101   traZODone  (DESYREL ) tablet 50 mg  50 mg Oral QHS PRN Motley-Mangrum, Jadeka A, PMHNP   50 mg at 05/01/24 2108    Lab Results: No results found for this or any previous visit (from the past 48 hours).  Blood Alcohol level:  Lab Results  Component Value Date   [facility] <15 04/30/2024   ETH <15 04/09/2024    Metabolic Disorder Labs: Lab Results  Component Value Date   HGBA1C 5.1 10/31/2023   MPG 99.67 10/31/2023   No results found for: PROLACTIN Lab Results  Component Value Date   CHOL 136 10/31/2023   TRIG 75 10/31/2023   HDL 37 (L) 10/31/2023   CHOLHDL 3.7 10/31/2023   VLDL 15 10/31/2023   LDLCALC 84 10/31/2023    Physical Findings: AIMS:  , ,  ,  ,    CIWA:    COWS:      Psychiatric Specialty  Exam:  Presentation   Mental status exam - Appearance- casual grooming /dress. Appears stated age.  In hospital clothes.  Alert and oriented x3. Behavior - cooperative.  No acute distress. Motor activity- psychomotor retardation noted Speech -  slow rate rhythm volume and tone. [place] - within normal limits Mood-  " depressed" Affect -  constricted, congruent to the mood. Thought Perception-No auditory and visual hallucinations. Does not appear to be responding to internal stimuli . Thought content -  Reports having suicidal thoughts on and off.  Reports that if she has gun she would shoot herself. Thought process -  Latent Association intact . Memory -intact . Fund of knowledge -intact . Attention -intact . Insight and judgment  poor Estimated Level of intellectual functioning-average. Estimated level of functioning-average.   Musculoskeletal: Strength & Muscle Tone: within normal limits Gait & Station: normal Assets  Assets: Communication Skills    Physical Exam: Physical Exam HENT:     Nose: Nose normal.     Mouth/Throat:     Mouth: Mucous membranes are moist.  Pulmonary:     Effort: Pulmonary effort is normal.  Neurological:     Mental Status: She is alert.  Psychiatric:        Attention and Perception: Attention normal.        Mood and Affect: Mood is depressed.        Speech: Speech normal.        Behavior: Behavior is withdrawn. Behavior is not agitated, aggressive, hyperactive or combative. Behavior is cooperative.        Thought Content: Thought content does not include homicidal or suicidal ideation. Thought content does not include homicidal or suicidal plan.    Review of Systems  Psychiatric/Behavioral:  Positive for depression. Negative for hallucinations and suicidal ideas. The patient is not nervous/anxious and does not have insomnia.   All other systems  reviewed and are negative.  Blood pressure 112/85, pulse (!) 58, temperature 97.6 F (36.4  C), resp. rate 20, height 5' 4 (1.626 m), weight 109.3 kg, SpO2 100%. Body mass index is 41.37 kg/m.  Diagnosis: Principal Problem:   Bipolar 1 disorder (HCC)   PLAN: Safety and Monitoring:  -- Voluntary admission to inpatient psychiatric unit for safety, stabilization and treatment  -- Daily contact with patient to assess and evaluate symptoms and progress in treatment  -- Patient's case to be discussed in multi-disciplinary team meeting  -- Observation Level : q15 minute checks  -- Vital signs:  q12 hours  -- Precautions: suicide, elopement, and assault -- Encouraged patient to participate in unit milieu and in scheduled group therapies  2. Psychiatric Diagnoses and Treatment:  Bipolar disorder   Discontinue Seroquel .  Start patient on lithium  300 mg b.I.d. and Abilify  5 mg at bedtime.               The risks/benefits/side-effects/alternatives to this medication were discussed in detail with the patient and time was given for questions. The patient consents to medication trial. -- Metabolic profile and EKG monitoring obtained while on an atypical antipsychotic  -- Encouraged patient to participate in unit milieu and in scheduled group therapies                    3. Medical Issues Being Addressed:  No medical issues identified at this time.   4. Discharge Planning:             -  -- Social work and case management to assist with discharge planning and identification of hospital follow-up needs prior to discharge  -- Estimated LOS: 1-2 weeks  Desmond Chimera, MD 05/06/2024, 9:07 PM

## 2024-05-06 NOTE — Progress Notes (Signed)
   05/06/24 2100  Psych Admission Type (Psych Patients Only)  Admission Status Voluntary  Psychosocial Assessment  Patient Complaints Depression  Eye Contact Brief  Facial Expression Sad  Affect Sad  Speech Soft  Interaction Isolative;Minimal  Motor Activity Slow  Appearance/Hygiene Unremarkable  Behavior Characteristics Cooperative  Mood Apathetic  Thought Process  Coherency WDL  Content WDL  Delusions None reported or observed  Perception WDL  Hallucination None reported or observed  Judgment Poor  Confusion None  Danger to Self  Current suicidal ideation? Denies  Agreement Not to Harm Self Yes  Description of Agreement verbal  Danger to Others  Danger to Others None reported or observed   Patient refused night medications.

## 2024-05-06 NOTE — Plan of Care (Signed)
 Lauren Poole is a 33 y.o. female patient. No diagnosis found. Past Medical History:  Diagnosis Date   Bipolar 1 disorder (HCC)    Chlamydia    Gonorrhea    Current Facility-Administered Medications  Medication Dose Route Frequency Provider Last Rate Last Admin   acetaminophen  (TYLENOL ) tablet 650 mg  650 mg Oral Q6H PRN Motley-Mangrum, Jadeka A, PMHNP       alum & mag hydroxide-simeth (MAALOX/MYLANTA) 200-200-20 MG/5ML suspension 30 mL  30 mL Oral Q4H PRN Motley-Mangrum, Jadeka A, PMHNP       haloperidol  (HALDOL ) tablet 5 mg  5 mg Oral TID PRN Motley-Mangrum, Jadeka A, PMHNP       And   diphenhydrAMINE  (BENADRYL ) capsule 50 mg  50 mg Oral TID PRN Motley-Mangrum, Jadeka A, PMHNP       haloperidol  lactate (HALDOL ) injection 5 mg  5 mg Intramuscular TID PRN Motley-Mangrum, Jadeka A, PMHNP       And   diphenhydrAMINE  (BENADRYL ) injection 50 mg  50 mg Intramuscular TID PRN Motley-Mangrum, Jadeka A, PMHNP       And   LORazepam  (ATIVAN ) injection 2 mg  2 mg Intramuscular TID PRN Motley-Mangrum, Jadeka A, PMHNP       haloperidol  lactate (HALDOL ) injection 10 mg  10 mg Intramuscular TID PRN Motley-Mangrum, Jadeka A, PMHNP       And   diphenhydrAMINE  (BENADRYL ) injection 50 mg  50 mg Intramuscular TID PRN Motley-Mangrum, Jadeka A, PMHNP       And   LORazepam  (ATIVAN ) injection 2 mg  2 mg Intramuscular TID PRN Motley-Mangrum, Jadeka A, PMHNP       magnesium  hydroxide (MILK OF MAGNESIA) suspension 30 mL  30 mL Oral Daily PRN Motley-Mangrum, Jadeka A, PMHNP       QUEtiapine  (SEROQUEL  XR) 24 hr tablet 200 mg  200 mg Oral QHS Hunter, Crystal L, PA-C   200 mg at 05/04/24 2110   traZODone  (DESYREL ) tablet 50 mg  50 mg Oral QHS PRN Motley-Mangrum, Jadeka A, PMHNP   50 mg at 05/01/24 2108   No Known Allergies Principal Problem:   Bipolar 1 disorder (HCC)  Blood pressure 97/74, pulse 66, temperature (!) 97.2 F (36.2 C), resp. rate 19, height 5' 4 (1.626 m), weight 109.3 kg, SpO2  99%.  Patient refused med this shift and stay in room most of the evening.  Lauren Poole B Tatisha Cerino 05/06/2024

## 2024-05-06 NOTE — Plan of Care (Signed)
  Problem: Education: Goal: Emotional status will improve Outcome: Progressing   Problem: Education: Goal: Mental status will improve Outcome: Progressing   Problem: Coping: Goal: Ability to demonstrate self-control will improve Outcome: Progressing   Problem: Safety: Goal: Periods of time without injury will increase Outcome: Progressing   Problem: Activity: Goal: Interest or engagement in activities will improve Outcome: Not Progressing   Problem: Health Behavior/Discharge Planning: Goal: Identification of resources available to assist in meeting health care needs will improve Outcome: Not Progressing

## 2024-05-06 NOTE — Progress Notes (Signed)
   05/06/24 1700  Psych Admission Type (Psych Patients Only)  Admission Status Voluntary  Psychosocial Assessment  Patient Complaints Depression  Eye Contact Brief  Facial Expression Sullen  Affect Sullen  Speech Slow  Interaction Isolative;Minimal  Motor Activity Slow  Appearance/Hygiene Unremarkable  Behavior Characteristics Cooperative  Mood Apathetic  Aggressive Behavior  Effect No apparent injury  Thought Process  Coherency WDL  Content WDL  Delusions None reported or observed  Perception WDL  Hallucination None reported or observed  Judgment Poor  Confusion None  Danger to Self  Current suicidal ideation? Denies  Agreement Not to Harm Self Yes  Description of Agreement Verbal  Danger to Others  Danger to Others None reported or observed   Darenda spent the majority of this shift in her room. Pleasant upon approach, but was isolative and guarded with information. Encouraged to participate in unit groups and activities.

## 2024-05-06 NOTE — Group Note (Signed)
 Date:  05/06/2024 Time:  11:55 AM  Group Topic/Focus:  Emotional Education:   The focus of this group is to discuss what feelings/emotions are, and how they are experienced.    Participation Level:  Did Not Attend  Lauren Poole 05/06/2024, 11:55 AM

## 2024-05-07 DIAGNOSIS — F319 Bipolar disorder, unspecified: Secondary | ICD-10-CM | POA: Diagnosis not present

## 2024-05-07 NOTE — Plan of Care (Signed)
   Problem: Activity: Goal: Interest or engagement in activities will improve Outcome: Progressing Goal: Sleeping patterns will improve Outcome: Progressing

## 2024-05-07 NOTE — Progress Notes (Signed)
   05/07/24 0900  Psych Admission Type (Psych Patients Only)  Admission Status Voluntary  Psychosocial Assessment  Patient Complaints Depression  Eye Contact Brief  Facial Expression Sad  Affect Sad  Speech Soft  Interaction Isolative  Motor Activity Slow  Appearance/Hygiene Unremarkable  Behavior Characteristics Cooperative  Mood Apathetic;Sad  Thought Process  Coherency WDL  Content WDL  Delusions None reported or observed  Perception WDL  Hallucination None reported or observed  Judgment Poor  Confusion None  Danger to Self  Current suicidal ideation? Denies  Danger to Others  Danger to Others None reported or observed

## 2024-05-07 NOTE — Group Note (Signed)
 Date:  05/07/2024 Time:  8:43 PM  Group Topic/Focus:  Making Healthy Choices:   The focus of this group is to help patients identify negative/unhealthy choices they were using prior to admission and identify positive/healthier coping strategies to replace them upon discharge. Personal Choices and Values:   The focus of this group is to help patients assess and explore the importance of values in their lives, how their values affect their decisions, how they express their values and what opposes their expression. Self Care:   The focus of this group is to help patients understand the importance of self-care in order to improve or restore emotional, physical, spiritual, interpersonal, and financial health.    Participation Level:  Active  Participation Quality:  Appropriate and Attentive  Affect:  Appropriate  Cognitive:  Alert, Appropriate, and Oriented  Insight: Appropriate and Good  Engagement in Group:  Engaged  Modes of Intervention:  Discussion and Support  Additional Comments:  N/A  Butler LITTIE Gelineau 05/07/2024, 8:43 PM

## 2024-05-07 NOTE — BH IP Treatment Plan (Signed)
 Interdisciplinary Treatment and Diagnostic Plan Update  05/07/2024 Time of Session: 4:57 pm Lauren Poole MRN: [id number]  Principal Diagnosis: Bipolar 1 disorder (HCC)  Secondary Diagnoses: Principal Problem:   Bipolar 1 disorder (HCC)   Current Medications:  Current Facility-Administered Medications  Medication Dose Route Frequency Provider Last Rate Last Admin   acetaminophen  (TYLENOL ) tablet 650 mg  650 mg Oral Q6H PRN Motley-Mangrum, Jadeka A, PMHNP       alum & mag hydroxide-simeth (MAALOX/MYLANTA) 200-200-20 MG/5ML suspension 30 mL  30 mL Oral Q4H PRN Motley-Mangrum, Jadeka A, PMHNP       ARIPiprazole  (ABILIFY ) tablet 5 mg  5 mg Oral QHS Shrivastava, Aryendra, MD       haloperidol  (HALDOL ) tablet 5 mg  5 mg Oral TID PRN Motley-Mangrum, Jadeka A, PMHNP       And   diphenhydrAMINE  (BENADRYL ) capsule 50 mg  50 mg Oral TID PRN Motley-Mangrum, Jadeka A, PMHNP       haloperidol  lactate (HALDOL ) injection 5 mg  5 mg Intramuscular TID PRN Motley-Mangrum, Jadeka A, PMHNP       And   diphenhydrAMINE  (BENADRYL ) injection 50 mg  50 mg Intramuscular TID PRN Motley-Mangrum, Jadeka A, PMHNP       And   LORazepam  (ATIVAN ) injection 2 mg  2 mg Intramuscular TID PRN Motley-Mangrum, Jadeka A, PMHNP       haloperidol  lactate (HALDOL ) injection 10 mg  10 mg Intramuscular TID PRN Motley-Mangrum, Jadeka A, PMHNP       And   diphenhydrAMINE  (BENADRYL ) injection 50 mg  50 mg Intramuscular TID PRN Motley-Mangrum, Jadeka A, PMHNP       And   LORazepam  (ATIVAN ) injection 2 mg  2 mg Intramuscular TID PRN Motley-Mangrum, Jadeka A, PMHNP       lithium  carbonate (LITHOBID ) ER tablet 300 mg  300 mg Oral BID Shrivastava, Aryendra, MD   300 mg at 05/07/24 0919   magnesium  hydroxide (MILK OF MAGNESIA) suspension 30 mL  30 mL Oral Daily PRN Motley-Mangrum, Jadeka A, PMHNP       traZODone  (DESYREL ) tablet 50 mg  50 mg Oral QHS PRN Motley-Mangrum, Jadeka A, PMHNP   50 mg at 05/01/24 2108   PTA  Medications: Medications Prior to Admission  Medication Sig Dispense Refill Last Dose/Taking   cholecalciferol  (CHOLECALCIFEROL ) 25 MCG tablet Take 1 tablet (1,000 Units total) by mouth daily. (Patient taking differently: Take 1,000 Units by mouth every 3 (three) days.) 30 tablet 0    naproxen sodium (ALEVE) 220 MG tablet Take 440 mg by mouth daily as needed (pain).      traZODone  (DESYREL ) 100 MG tablet Take 1 tablet (100 mg total) by mouth at bedtime. (Patient not taking: Reported on 04/30/2024) 30 tablet 0     Patient Stressors: Financial difficulties    Patient Strengths: Manufacturing systems engineer   Treatment Modalities: Medication Management, Group therapy, Case management,  1 to 1 session with clinician, Psychoeducation, Recreational therapy.   Physician Treatment Plan for Primary Diagnosis: Bipolar 1 disorder (HCC) Long Term Goal(s): Improvement in symptoms so as ready for discharge   Short Term Goals: Ability to identify changes in lifestyle to reduce recurrence of condition will improve Ability to verbalize feelings will improve Ability to disclose and discuss suicidal ideas Ability to identify and develop effective coping behaviors will improve  Medication Management: Evaluate patient's response, side effects, and tolerance of medication regimen.  Therapeutic Interventions: 1 to 1 sessions, Unit Group sessions and Medication administration.  Evaluation of Outcomes: Progressing  Physician Treatment Plan for Secondary Diagnosis: Principal Problem:   Bipolar 1 disorder (HCC)  Long Term Goal(s): Improvement in symptoms so as ready for discharge   Short Term Goals: Ability to identify changes in lifestyle to reduce recurrence of condition will improve Ability to verbalize feelings will improve Ability to disclose and discuss suicidal ideas Ability to identify and develop effective coping behaviors will improve     Medication Management: Evaluate patient's response, side  effects, and tolerance of medication regimen.  Therapeutic Interventions: 1 to 1 sessions, Unit Group sessions and Medication administration.  Evaluation of Outcomes: Progressing   RN Treatment Plan for Primary Diagnosis: Bipolar 1 disorder (HCC) Long Term Goal(s): Knowledge of disease and therapeutic regimen to maintain health will improve  Short Term Goals: Ability to remain free from injury will improve, Ability to verbalize frustration and anger appropriately will improve, Ability to demonstrate self-control, Ability to participate in decision making will improve, Ability to verbalize feelings will improve, Ability to disclose and discuss suicidal ideas, Ability to identify and develop effective coping behaviors will improve, and Compliance with prescribed medications will improve  Medication Management: RN will administer medications as ordered by provider, will assess and evaluate patient's response and provide education to patient for prescribed medication. RN will report any adverse and/or side effects to prescribing provider.  Therapeutic Interventions: 1 on 1 counseling sessions, Psychoeducation, Medication administration, Evaluate responses to treatment, Monitor vital signs and CBGs as ordered, Perform/monitor CIWA, COWS, AIMS and Fall Risk screenings as ordered, Perform wound care treatments as ordered.  Evaluation of Outcomes: Progressing   LCSW Treatment Plan for Primary Diagnosis: Bipolar 1 disorder (HCC) Long Term Goal(s): Safe transition to appropriate next level of care at discharge, Engage patient in therapeutic group addressing interpersonal concerns.  Short Term Goals: Engage patient in aftercare planning with referrals and resources, Increase social support, Increase ability to appropriately verbalize feelings, Increase emotional regulation, Facilitate acceptance of mental health diagnosis and concerns, Facilitate patient progression through stages of change regarding  substance use diagnoses and concerns, Identify triggers associated with mental health/substance abuse issues, and Increase skills for wellness and recovery  Therapeutic Interventions: Assess for all discharge needs, 1 to 1 time with Social worker, Explore available resources and support systems, Assess for adequacy in community support network, Educate family and significant other(s) on suicide prevention, Complete Psychosocial Assessment, Interpersonal group therapy.  Evaluation of Outcomes: Progressing   Progress in Treatment: Attending groups: No. Participating in groups: No. Taking medication as prescribed: Yes. Toleration medication: Yes. Family/Significant other contact made: No, will contact:  Once permission is provided Patient understands diagnosis: Yes. Discussing patient identified problems/goals with staff: Yes. Medical problems stabilized or resolved: Yes. Denies suicidal/homicidal ideation: Yes. Issues/concerns per patient self-inventory: No. Other: None  New problem(s) identified: No, Describe:  none identified. Update 05/07/24: No changes at this time.   New Short Term/Long Term Goal(s): elimination of symptoms of psychosis, medication management for mood stabilization; elimination of SI thoughts; development of comprehensive mental wellness/sobriety plan. Update 05/07/24: No changes at this time.   Patient Goals: To go home.   Update 05/07/24: No changes at this time.   Discharge Plan or Barriers: CSW will assist pt with development of an appropriate aftercare/discharge plan.   Update 05/07/24: No changes at this time.   Reason for Continuation of Hospitalization: Aggression Medication stabilization   Estimated Length of Stay: 1-7 days  Update 05/07/24: No changes at this time.  Last 3 [place] Suicide Severity Risk Score: [employer]  Admission (Current) from 04/30/2024 in [facility] INPATIENT BEHAVIORAL MEDICINE Most recent reading at 04/30/2024  6:00 PM ED from  04/30/2024 in [facility] Emergency Department at [facility] Most recent reading at 04/30/2024  1:04 AM Admission (Discharged) from 04/09/2024 in [facility] INPATIENT BEHAVIORAL MEDICINE Most recent reading at 04/09/2024  6:00 PM  C-SSRS RISK CATEGORY High Risk High Risk Low Risk    Last PHQ 2/9 Scores:     No data to display          Scribe for Treatment Team: Roselyn GORMAN Lento, KEN 05/07/2024 4:57 PM

## 2024-05-07 NOTE — Group Note (Signed)
 Date:  05/07/2024 Time:  5:04 PM  Group Topic/Focus:  Diagnosis Education:   The focus of this group is to discuss the major disorders that patients maybe diagnosed with.  Group discusses the importance of knowing what one's diagnosis is so that one can understand treatment and better advocate for oneself.    Participation Level:  Did Not Attend   Lauren Poole 05/07/2024, 5:04 PM

## 2024-05-07 NOTE — Group Note (Signed)
 LCSW Group Therapy Note   Group Date: 05/07/2024 Start Time: 1355 End Time: 1445   Type of Therapy and Topic:  Group Therapy: Change and Accountability  Participation Level:  Did Not Attend     Summary of Patient Progress:  The patient did not attend group.    Roselyn GORMAN Lento, LCSWA 05/07/2024  4:10 PM

## 2024-05-07 NOTE — Progress Notes (Signed)
 [facility] MD Progress Note  05/05/2024 2:12 PM Lauren Poole  MRN:  [id number]  33 year old female who presented to the ED via EMS following a suicide attempt by overdose on 04/30/24. She reported ingesting approximately 30 Aleve pills. After ingestion, the patient did not experience vomiting or other acute physical symptoms. She reports that initially she wanted to not wake up, but later became fearful and contacted a friend, who encouraged her to seek care in the ED. The patient reports she has struggled with chronic suicidal ideation for about 1 year.  She states things have been hard but declines to elaborate on specific triggers.   Subjective:  Chart reviewed, case discussed in multidisciplinary meeting, patient seen during rounds.   05/07/2024: Per nursing report patient did not come out and remained mostly to her room.  This Clinical research associate talked with the patient and her room.  Patient reports that she got Seroquel  yesterday and still thinks sleepy because of that.  She reports that she got lithium  today in the morning for the first time and would get Abilify  tonight.  Patient continues to report having suicidal ideations.  Reports that the last time she had suicidal ideations was yesterday in the evening time and she just woke up in the morning.  Still feeling depressed and rates her depression as 5 out of 10.  Patient reports that her normal self is when she rates her depression as 2 out of 10.  Continues to report that she has thoughts of shooting herself if she would have a gun.  But she does not have any access to guns.    05/06/2024:   per staff report patient has refused to take the Seroquel  at night yesterday.  Patient is still coming out and staying in her home.  Patient on interview reports that she came suicide attempt and reports that she had 1 previous suicide attempt this year.  Patient continues to report that she is depressed.  Patient reports that it is end of road for her.  Patient  reports that she does not have access to gun but if she has done she would just get it over with.  Patient remained vague but reports that she has ongoing financial, family, life stressors.  Patient reports history consistent with bipolar 1   With manic episode lasting for 1 month but reports that these days she is not  having manic symptoms and mostly getting depressed.   9/19: On interview today patient is noted to be seated up in bed looking towards the window.  She is more engaged in interview today than she has been in recent days.   She was compliant with Seroquel  dose last night, and states she feels that it is working.  She reports tolerating medication well without adverse effects.  She is sleeping well and reports appetite as normal.  She denies SI/HI/plan and denies hallucinations.  She rates depression as a 4 out of 10 and anxiety as a 0 out of 10 today.  She does not voice any concerns or complaints today.  9/18: On interview today patient is noted to be alert and laying in bed.  She declines to communicate with provider verbally.  Patient responds to questions during interview with a head nod or head shake.  Patient declined medication last night per nursing report.  She denies SI/HI/plan and denies hallucinations via shaking her head to indicate 'no' when asked.  She declined to answer questions regarding sleep and appetite.  When asked if tolerating  medication patient nods head to indicate 'yes'.  Patient declined to participate further in interview.  9/17: On interview today patient is noted to be laying in bed with hand covering her face.  She continues to be withdrawn and minimizes symptoms.  She denies current SI/HI/plan and denies hallucinations.  She states she is tolerating initiation of Seroquel  well.  Patient declines to answer further questions.  9/16: On interview, patient is noted to be laying in bed.  Per nursing report, she has primarily been isolated to her room since her  arrival on the unit.  She endorses passive SI without current intent or plan.  She denies HI/plan and denies hallucinations.  She reports she is sleeping and eating well.  Interviewer discussed treatment options with patient to include medication.  Patient states she prefers to be restarted on Seroquel , which she has taken in the past with good response.  Patient does not voice any other complaints or concerns today.   Sleep: Patient declined to answer  Appetite: Patient declined to answer  Past Psychiatric History: see h&P Family History:  Family History  Problem Relation Age of Onset   Schizophrenia Sister    Social History:  Social History   Substance and Sexual Activity  Alcohol Use Yes   Alcohol/week: 4.0 standard drinks of alcohol   Types: 4 Glasses of wine per week     Social History   Substance and Sexual Activity  Drug Use Not Currently   Types: Marijuana    Social History   Socioeconomic History   Marital status: Single    Spouse name: Not on file   Number of children: Not on file   Years of education: Not on file   Highest education level: Not on file  Occupational History   Not on file  Tobacco Use   Smoking status: Every Day    Current packs/day: 0.50    Average packs/day: 0.5 packs/day for 6.6 years (3.3 ttl pk-yrs)    Types: Cigarettes    Start date: 10/2017   Smokeless tobacco: Never  Vaping Use   Vaping status: Never Used  Substance and Sexual Activity   Alcohol use: Yes    Alcohol/week: 4.0 standard drinks of alcohol    Types: 4 Glasses of wine per week   Drug use: Not Currently    Types: Marijuana   Sexual activity: Yes    Birth control/protection: None  Other Topics Concern   Not on file  Social History Narrative   Not on file   Social Drivers of Health   Financial Resource Strain: Not on file  Food Insecurity: Food Insecurity Present (04/30/2024)   Hunger Vital Sign    Worried About Running Out of Food in the Last Year: Sometimes  true    Ran Out of Food in the Last Year: Sometimes true  Transportation Needs: Unmet Transportation Needs (04/30/2024)   PRAPARE - Administrator, Civil Service (Medical): Yes    Lack of Transportation (Non-Medical): Yes  Physical Activity: Not on file  Stress: Not on file  Social Connections: Not on file   Past Medical History:  Past Medical History:  Diagnosis Date   Bipolar 1 disorder (HCC)    Chlamydia    Gonorrhea     Past Surgical History:  Procedure Laterality Date   NO PAST SURGERIES      Current Medications: Current Facility-Administered Medications  Medication Dose Route Frequency Provider Last Rate Last Admin   acetaminophen  (TYLENOL ) tablet 650 mg  650 mg Oral Q6H PRN Motley-Mangrum, Jadeka A, PMHNP       alum & mag hydroxide-simeth (MAALOX/MYLANTA) 200-200-20 MG/5ML suspension 30 mL  30 mL Oral Q4H PRN Motley-Mangrum, Jadeka A, PMHNP       ARIPiprazole  (ABILIFY ) tablet 5 mg  5 mg Oral QHS Cleda Imel, MD       haloperidol  (HALDOL ) tablet 5 mg  5 mg Oral TID PRN Motley-Mangrum, Jadeka A, PMHNP       And   diphenhydrAMINE  (BENADRYL ) capsule 50 mg  50 mg Oral TID PRN Motley-Mangrum, Jadeka A, PMHNP       haloperidol  lactate (HALDOL ) injection 5 mg  5 mg Intramuscular TID PRN Motley-Mangrum, Jadeka A, PMHNP       And   diphenhydrAMINE  (BENADRYL ) injection 50 mg  50 mg Intramuscular TID PRN Motley-Mangrum, Jadeka A, PMHNP       And   LORazepam  (ATIVAN ) injection 2 mg  2 mg Intramuscular TID PRN Motley-Mangrum, Jadeka A, PMHNP       haloperidol  lactate (HALDOL ) injection 10 mg  10 mg Intramuscular TID PRN Motley-Mangrum, Jadeka A, PMHNP       And   diphenhydrAMINE  (BENADRYL ) injection 50 mg  50 mg Intramuscular TID PRN Motley-Mangrum, Jadeka A, PMHNP       And   LORazepam  (ATIVAN ) injection 2 mg  2 mg Intramuscular TID PRN Motley-Mangrum, Jadeka A, PMHNP       lithium  carbonate (LITHOBID ) ER tablet 300 mg  300 mg Oral BID Caleb Decock, MD    300 mg at 05/07/24 0919   magnesium  hydroxide (MILK OF MAGNESIA) suspension 30 mL  30 mL Oral Daily PRN Motley-Mangrum, Jadeka A, PMHNP       traZODone  (DESYREL ) tablet 50 mg  50 mg Oral QHS PRN Motley-Mangrum, Jadeka A, PMHNP   50 mg at 05/01/24 2108    Lab Results: No results found for this or any previous visit (from the past 48 hours).  Blood Alcohol level:  Lab Results  Component Value Date   [facility] <15 04/30/2024   ETH <15 04/09/2024    Metabolic Disorder Labs: Lab Results  Component Value Date   HGBA1C 5.1 10/31/2023   MPG 99.67 10/31/2023   No results found for: PROLACTIN Lab Results  Component Value Date   CHOL 136 10/31/2023   TRIG 75 10/31/2023   HDL 37 (L) 10/31/2023   CHOLHDL 3.7 10/31/2023   VLDL 15 10/31/2023   LDLCALC 84 10/31/2023    Physical Findings: AIMS:  , ,  ,  ,    CIWA:    COWS:      Psychiatric Specialty Exam:  Presentation   Mental status exam - Appearance- casual grooming /dress. Appears stated age.  In hospital clothes.  Alert and oriented x3. Behavior - cooperative.  No acute distress. Motor activity- psychomotor retardation noted Speech -  slow rate rhythm volume and tone. [place] - within normal limits Mood-  " depressed" Affect -  constricted, congruent to the mood. Thought Perception-No auditory and visual hallucinations. Does not appear to be responding to internal stimuli . Thought content -  Reports having suicidal thoughts on and off.  Reports that if she has gun she would shoot herself. Thought process -  Latent Association intact . Memory -intact . Fund of knowledge -intact . Attention -intact . Insight and judgment  poor Estimated Level of intellectual functioning-average. Estimated level of functioning-average.   Musculoskeletal: Strength & Muscle Tone: within normal limits Gait & Station: normal Assets  Assets: Communication Skills    Physical Exam: Physical Exam HENT:     Nose: Nose normal.      Mouth/Throat:     Mouth: Mucous membranes are moist.  Pulmonary:     Effort: Pulmonary effort is normal.  Neurological:     Mental Status: She is alert.  Psychiatric:        Attention and Perception: Attention normal.        Mood and Affect: Mood is depressed.        Speech: Speech normal.        Behavior: Behavior is withdrawn. Behavior is not agitated, aggressive, hyperactive or combative. Behavior is cooperative.        Thought Content: Thought content does not include homicidal or suicidal ideation. Thought content does not include homicidal or suicidal plan.    Review of Systems  Psychiatric/Behavioral:  Positive for depression. Negative for hallucinations and suicidal ideas. The patient is not nervous/anxious and does not have insomnia.   All other systems reviewed and are negative.  Blood pressure 112/85, pulse (!) 58, temperature 97.6 F (36.4 C), resp. rate 20, height 5' 4 (1.626 m), weight 109.3 kg, SpO2 100%. Body mass index is 41.37 kg/m.  Diagnosis: Principal Problem:   Bipolar 1 disorder (HCC)   PLAN: Safety and Monitoring:  -- Voluntary admission to inpatient psychiatric unit for safety, stabilization and treatment  -- Daily contact with patient to assess and evaluate symptoms and progress in treatment  -- Patient's case to be discussed in multi-disciplinary team meeting  -- Observation Level : q15 minute checks  -- Vital signs:  q12 hours  -- Precautions: suicide, elopement, and assault -- Encouraged patient to participate in unit milieu and in scheduled group therapies  2. Psychiatric Diagnoses and Treatment:  Bipolar disorder   Discontinue Seroquel .  Start patient on lithium  300 mg b.I.d. and Abilify  5 mg at bedtime.               The risks/benefits/side-effects/alternatives to this medication were discussed in detail with the patient and time was given for questions. The patient consents to medication trial. -- Metabolic profile and EKG monitoring obtained  while on an atypical antipsychotic  -- Encouraged patient to participate in unit milieu and in scheduled group therapies                    3. Medical Issues Being Addressed:  No medical issues identified at this time.   4. Discharge Planning:             -  -- Social work and case management to assist with discharge planning and identification of hospital follow-up needs prior to discharge  -- Estimated LOS: 1-2 weeks  Desmond Chimera, MD 05/07/2024, 2:12 PM

## 2024-05-08 LAB — VITAMIN D 25 HYDROXY (VIT D DEFICIENCY, FRACTURES): Vit D, 25-Hydroxy: 27.58 ng/mL — ABNORMAL LOW (ref 30–100)

## 2024-05-08 NOTE — Group Note (Signed)
 Recreation Therapy Group Note   Group Topic:Other  Group Date: 05/08/2024 Start Time: 1455 End Time: 1540 Facilitators: Celestia Jeoffrey FORBES ARTICE, CTRS Location: Craft Room  Activity Description/Intervention: Therapeutic Drumming. Patients with peers and staff were given the opportunity to engage in a leader facilitated [facility] Group Empowerment Drumming Circle with staff from the [employer], in partnership with [employer]. Teaching laboratory technician and trained [employer], Norleen Mon leading with LRT observing and documenting intervention and pt response. This evidenced-based practice targets 7 areas of health and wellbeing in the human experience including: stress-reduction, exercise, self-expression, camaraderie/support, nurturing, spirituality, and music-making (leisure).    Goal Area(s) Addresses:  Patient will engage in pro-social way in music group.  Patient will follow directions of drum leader on the first prompt. Patient will demonstrate no behavioral issues during group.  Patient will identify if a reduction in stress level occurs as a result of participation in therapeutic drum circle.     Affect/Mood: Appropriate   Participation Level: Active and Engaged   Participation Quality: Independent   Behavior: Calm and Cooperative   Speech/Thought Process: Coherent   Insight: Good   Judgement: Good   Modes of Intervention: Music   Patient Response to Interventions:  Attentive and Receptive   Education Outcome:  Acknowledges education   Clinical Observations/Individualized Feedback: Willam was active in their participation of session activities and group discussion.    Plan: Continue to engage patient in RT group sessions 2-3x/week.   Jeoffrey FORBES Celestia, LRT, CTRS 05/08/2024 4:44 PM

## 2024-05-08 NOTE — Group Note (Signed)
 LCSW Group Therapy Note  Group Date: 05/08/2024 Start Time: 1300 End Time: 1400   Type of Therapy and Topic:  Group Therapy - How To Cope with Nervousness about Discharge   Participation Level:  None   Description of Group This process group involved identification of patients' feelings about discharge. Some of them are scheduled to be discharged soon, while others are new admissions, but each of them was asked to share thoughts and feelings surrounding discharge from the hospital. One common theme was that they are excited at the prospect of going home, while another was that many of them are apprehensive about sharing why they were hospitalized. Patients were given the opportunity to discuss these feelings with their peers in preparation for discharge.  Therapeutic Goals  Patient will identify their overall feelings about pending discharge. Patient will think about how they might proactively address issues that they believe will once again arise once they get home (i.e. with parents). Patients will participate in discussion about having hope for change.   Summary of Patient Progress:   Patient attended group at the close, however, did not participate in group discussions.     Therapeutic Modalities Cognitive Behavioral Therapy   Sherryle JINNY Margo, LCSW 05/08/2024  3:08 PM

## 2024-05-08 NOTE — Group Note (Signed)
 Date:  05/08/2024 Time:  8:48 PM  Group Topic/Focus:  Wrap-Up Group:   The focus of this group is to help patients review their daily goal of treatment and discuss progress on daily workbooks.    Participation Level:  Active  Participation Quality:  Appropriate  Affect:  Appropriate  Cognitive:  Appropriate  Insight: Appropriate  Engagement in Group:  Engaged  Modes of Intervention:  Discussion  Additional Comments:    Leigh VEAR Pais 05/08/2024, 8:48 PM

## 2024-05-08 NOTE — Progress Notes (Signed)
   05/08/24 2100  Psych Admission Type (Psych Patients Only)  Admission Status Voluntary  Psychosocial Assessment  Patient Complaints Depression  Eye Contact Fair  Facial Expression Sad  Affect Depressed  Speech Logical/coherent  Interaction Minimal  Motor Activity Slow  Appearance/Hygiene Unremarkable  Behavior Characteristics Calm  Mood Depressed  Thought Process  Coherency WDL  Content WDL  Delusions None reported or observed  Perception WDL  Hallucination None reported or observed  Judgment Impaired  Confusion None  Danger to Self  Current suicidal ideation? Denies  Danger to Others  Danger to Others None reported or observed

## 2024-05-08 NOTE — Plan of Care (Signed)
  Problem: Education: Goal: Knowledge of [facility] Education information/materials will improve Outcome: Progressing   Problem: Education: Goal: Emotional status will improve Outcome: Progressing   Problem: Education: Goal: Mental status will improve Outcome: Progressing   

## 2024-05-08 NOTE — Progress Notes (Signed)
   05/07/24 2000  Psych Admission Type (Psych Patients Only)  Admission Status Voluntary  Psychosocial Assessment  Patient Complaints Depression  Eye Contact Fair  Facial Expression Anxious  Affect Anxious  Speech Logical/coherent  Interaction Isolative  Motor Activity Slow  Appearance/Hygiene Improved  Behavior Characteristics Unwilling to participate  Mood Labile  Thought Process  Coherency WDL  Content WDL  Delusions None reported or observed  Perception WDL  Hallucination None reported or observed  Judgment UTA  Confusion None  Danger to Self  Current suicidal ideation? Denies  Danger to Others  Danger to Others None reported or observed   Patient alert and oriented x 4, affect is flat, she forwards very little and isolates. Patient's thoughts are organized and coherent. 15 minutes safety checks maintained.

## 2024-05-08 NOTE — Group Note (Signed)
 Recreation Therapy Group Note   Group Topic:Health and Wellness  Group Date: 05/08/2024 Start Time: 1100 End Time: 1135 Facilitators: Celestia Jeoffrey BRAVO, LRT, CTRS Location: Courtyard  Group Description: [employer]. LRT and patients played games of basketball, drew with chalk, and played corn hole while outside in the courtyard while getting fresh air and sunlight. Music was being played in the background. LRT and peers conversed about different games they have played before, what they do in their free time and anything else that is on their minds. LRT encouraged pts to drink water after being outside, sweating and getting their heart rate up.  Goal Area(s) Addressed: Patient will build on frustration tolerance skills. Patients will partake in a competitive play game with peers. Patients will gain knowledge of new leisure interest/hobby.    Affect/Mood: N/A   Participation Level: Did not attend    Clinical Observations/Individualized Feedback: Patient did not attend group.   Plan: Continue to engage patient in RT group sessions 2-3x/week.   Jeoffrey BRAVO Celestia, LRT, CTRS 05/08/2024 11:43 AM

## 2024-05-08 NOTE — Progress Notes (Incomplete)
 [facility] MD Progress Note  05/08/2024 10:24 AM Lauren Poole  MRN:  [id number]  33 year old female who presented to the ED via EMS following a suicide attempt by overdose on 04/30/24. She reported ingesting approximately 30 Aleve pills. After ingestion, the patient did not experience vomiting or other acute physical symptoms. She reports that initially she wanted to not wake up, but later became fearful and contacted a friend, who encouraged her to seek care in the ED. The patient reports she has struggled with chronic suicidal ideation for about 1 year.  She states things have been hard but declines to elaborate on specific triggers.   Subjective:  Chart reviewed, case discussed in multidisciplinary meeting, patient seen during rounds.   05/08/2024: On interview today patient is noted to be sitting up in bed.  She is more engaged in interview today.  She reports she has not started Abilify  yet but is planning to take first dose this evening.  She has started lithium  and is tolerating this well.  She endorses symptoms of depression but denies persistent anxiety.  She denies current SI/HI/plan and denies hallucinations.  She notes most recent episode of passive SI was yesterday.  She does not voice any concerns or complaints today.  She discusses desire to do something productive with her life.  05/07/2024: Per nursing report patient did not come out and remained mostly to her room.  This Clinical research associate talked with the patient and her room.  Patient reports that she got Seroquel  yesterday and still thinks sleepy because of that.  She reports that she got lithium  today in the morning for the first time and would get Abilify  tonight.  Patient continues to report having suicidal ideations.  Reports that the last time she had suicidal ideations was yesterday in the evening time and she just woke up in the morning.  Still feeling depressed and rates her depression as 5 out of 10.  Patient reports that her normal  self is when she rates her depression as 2 out of 10.  Continues to report that she has thoughts of shooting herself if she would have a gun.  But she does not have any access to guns.    05/06/2024:   per staff report patient has refused to take the Seroquel  at night yesterday.  Patient is still coming out and staying in her home.  Patient on interview reports that she came suicide attempt and reports that she had 1 previous suicide attempt this year.  Patient continues to report that she is depressed.  Patient reports that it is end of road for her.  Patient reports that she does not have access to gun but if she has done she would just get it over with.  Patient remained vague but reports that she has ongoing financial, family, life stressors.  Patient reports history consistent with bipolar 1   With manic episode lasting for 1 month but reports that these days she is not  having manic symptoms and mostly getting depressed.   9/19: On interview today patient is noted to be seated up in bed looking towards the window.  She is more engaged in interview today than she has been in recent days.   She was compliant with Seroquel  dose last night, and states she feels that it is working.  She reports tolerating medication well without adverse effects.  She is sleeping well and reports appetite as normal.  She denies SI/HI/plan and denies hallucinations.  She rates depression as a  4 out of 10 and anxiety as a 0 out of 10 today.  She does not voice any concerns or complaints today.  9/18: On interview today patient is noted to be alert and laying in bed.  She declines to communicate with provider verbally.  Patient responds to questions during interview with a head nod or head shake.  Patient declined medication last night per nursing report.  She denies SI/HI/plan and denies hallucinations via shaking her head to indicate 'no' when asked.  She declined to answer questions regarding sleep and appetite.  When  asked if tolerating medication patient nods head to indicate 'yes'.  Patient declined to participate further in interview.  9/17: On interview today patient is noted to be laying in bed with hand covering her face.  She continues to be withdrawn and minimizes symptoms.  She denies current SI/HI/plan and denies hallucinations.  She states she is tolerating initiation of Seroquel  well.  Patient declines to answer further questions.  9/16: On interview, patient is noted to be laying in bed.  Per nursing report, she has primarily been isolated to her room since her arrival on the unit.  She endorses passive SI without current intent or plan.  She denies HI/plan and denies hallucinations.  She reports she is sleeping and eating well.  Interviewer discussed treatment options with patient to include medication.  Patient states she prefers to be restarted on Seroquel , which she has taken in the past with good response.  Patient does not voice any other complaints or concerns today.   Sleep: Patient declined to answer  Appetite: Patient declined to answer  Past Psychiatric History: see h&P Family History:  Family History  Problem Relation Age of Onset   Schizophrenia Sister    Social History:  Social History   Substance and Sexual Activity  Alcohol Use Yes   Alcohol/week: 4.0 standard drinks of alcohol   Types: 4 Glasses of wine per week     Social History   Substance and Sexual Activity  Drug Use Not Currently   Types: Marijuana    Social History   Socioeconomic History   Marital status: Single    Spouse name: Not on file   Number of children: Not on file   Years of education: Not on file   Highest education level: Not on file  Occupational History   Not on file  Tobacco Use   Smoking status: Every Day    Current packs/day: 0.50    Average packs/day: 0.5 packs/day for 6.6 years (3.3 ttl pk-yrs)    Types: Cigarettes    Start date: 10/2017   Smokeless tobacco: Never  Vaping Use    Vaping status: Never Used  Substance and Sexual Activity   Alcohol use: Yes    Alcohol/week: 4.0 standard drinks of alcohol    Types: 4 Glasses of wine per week   Drug use: Not Currently    Types: Marijuana   Sexual activity: Yes    Birth control/protection: None  Other Topics Concern   Not on file  Social History Narrative   Not on file   Social Drivers of Health   Financial Resource Strain: Not on file  Food Insecurity: Food Insecurity Present (04/30/2024)   Hunger Vital Sign    Worried About Running Out of Food in the Last Year: Sometimes true    Ran Out of Food in the Last Year: Sometimes true  Transportation Needs: Unmet Transportation Needs (04/30/2024)   PRAPARE - Transportation  Lack of Transportation (Medical): Yes    Lack of Transportation (Non-Medical): Yes  Physical Activity: Not on file  Stress: Not on file  Social Connections: Not on file   Past Medical History:  Past Medical History:  Diagnosis Date   Bipolar 1 disorder (HCC)    Chlamydia    Gonorrhea     Past Surgical History:  Procedure Laterality Date   NO PAST SURGERIES      Current Medications: Current Facility-Administered Medications  Medication Dose Route Frequency Provider Last Rate Last Admin   acetaminophen  (TYLENOL ) tablet 650 mg  650 mg Oral Q6H PRN Motley-Mangrum, Jadeka A, PMHNP       alum & mag hydroxide-simeth (MAALOX/MYLANTA) 200-200-20 MG/5ML suspension 30 mL  30 mL Oral Q4H PRN Motley-Mangrum, Jadeka A, PMHNP       ARIPiprazole  (ABILIFY ) tablet 5 mg  5 mg Oral QHS Shrivastava, Aryendra, MD       haloperidol  (HALDOL ) tablet 5 mg  5 mg Oral TID PRN Motley-Mangrum, Jadeka A, PMHNP       And   diphenhydrAMINE  (BENADRYL ) capsule 50 mg  50 mg Oral TID PRN Motley-Mangrum, Jadeka A, PMHNP       haloperidol  lactate (HALDOL ) injection 5 mg  5 mg Intramuscular TID PRN Motley-Mangrum, Jadeka A, PMHNP       And   diphenhydrAMINE  (BENADRYL ) injection 50 mg  50 mg Intramuscular TID PRN  Motley-Mangrum, Jadeka A, PMHNP       And   LORazepam  (ATIVAN ) injection 2 mg  2 mg Intramuscular TID PRN Motley-Mangrum, Jadeka A, PMHNP       haloperidol  lactate (HALDOL ) injection 10 mg  10 mg Intramuscular TID PRN Motley-Mangrum, Jadeka A, PMHNP       And   diphenhydrAMINE  (BENADRYL ) injection 50 mg  50 mg Intramuscular TID PRN Motley-Mangrum, Jadeka A, PMHNP       And   LORazepam  (ATIVAN ) injection 2 mg  2 mg Intramuscular TID PRN Motley-Mangrum, Jadeka A, PMHNP       lithium  carbonate (LITHOBID ) ER tablet 300 mg  300 mg Oral BID Shrivastava, Aryendra, MD   300 mg at 05/08/24 0754   magnesium  hydroxide (MILK OF MAGNESIA) suspension 30 mL  30 mL Oral Daily PRN Motley-Mangrum, Jadeka A, PMHNP       traZODone  (DESYREL ) tablet 50 mg  50 mg Oral QHS PRN Motley-Mangrum, Jadeka A, PMHNP   50 mg at 05/01/24 2108    Lab Results: No results found for this or any previous visit (from the past 48 hours).  Blood Alcohol level:  Lab Results  Component Value Date   [facility] <15 04/30/2024   ETH <15 04/09/2024    Metabolic Disorder Labs: Lab Results  Component Value Date   HGBA1C 5.1 10/31/2023   MPG 99.67 10/31/2023   No results found for: PROLACTIN Lab Results  Component Value Date   CHOL 136 10/31/2023   TRIG 75 10/31/2023   HDL 37 (L) 10/31/2023   CHOLHDL 3.7 10/31/2023   VLDL 15 10/31/2023   LDLCALC 84 10/31/2023    Physical Findings: AIMS:  , ,  ,  ,    CIWA:    COWS:      Psychiatric Specialty Exam:  Presentation   Mental status exam - Appearance- casual grooming /dress. Appears stated age.  In hospital clothes.  Alert and oriented x3. Behavior - cooperative.  No acute distress. Motor activity- psychomotor retardation noted Speech -  slow rate rhythm volume and tone. [place] - within normal  limits Mood-  "depressed" Affect -  constricted, congruent to the mood. Thought Perception-No auditory and visual hallucinations. Does not appear to be responding to internal  stimuli . Thought content -  Reports having suicidal thoughts on and off.  Reports that if she has gun she would shoot herself. Thought process -  Latent Association intact . Memory -intact . Fund of knowledge -intact . Attention -intact . Insight and judgment  poor Estimated Level of intellectual functioning-average. Estimated level of functioning-average.   Musculoskeletal: Strength & Muscle Tone: within normal limits Gait & Station: normal Assets  Assets: Communication Skills    Physical Exam: Physical Exam ROS Blood pressure 135/83, pulse (!) 110, temperature 98.5 F (36.9 C), temperature source Oral, resp. rate 18, height 5' 4 (1.626 m), weight 109.3 kg, SpO2 99%. Body mass index is 41.37 kg/m.  Diagnosis: Principal Problem:   Bipolar 1 disorder (HCC)   PLAN: Safety and Monitoring:  -- Voluntary admission to inpatient psychiatric unit for safety, stabilization and treatment  -- Daily contact with patient to assess and evaluate symptoms and progress in treatment  -- Patient's case to be discussed in multi-disciplinary team meeting  -- Observation Level : q15 minute checks  -- Vital signs:  q12 hours  -- Precautions: suicide, elopement, and assault -- Encouraged patient to participate in unit milieu and in scheduled group therapies  2. Psychiatric Diagnoses and Treatment:  Bipolar disorder    Continue lithium  300 mg b.I.d. and Abilify  5 mg at bedtime.  Labs ordered for thyroid  panel and vitamin D .              The risks/benefits/side-effects/alternatives to this medication were discussed in detail with the patient and time was given for questions. The patient consents to medication trial. -- Metabolic profile and EKG monitoring obtained while on an atypical antipsychotic  -- Encouraged patient to participate in unit milieu and in scheduled group therapies                    3. Medical Issues Being Addressed:  No medical issues identified at this time.   4.  Discharge Planning:   -- Social work and case management to assist with discharge planning and identification of hospital follow-up needs prior to discharge  -- Estimated LOS: 1-2 weeks  Lizzy Hamre LITTIE Lukes, PA-C 05/08/2024, 10:24 AM

## 2024-05-08 NOTE — Plan of Care (Signed)
  Problem: Education: Goal: Knowledge of [facility] Education information/materials will improve 05/08/2024 0725 by Scarlet Jannett Humbles, RN Outcome: Progressing 05/08/2024 0721 by Scarlet Jannett Humbles, RN Outcome: Progressing   Problem: Education: Goal: Emotional status will improve Outcome: Progressing   Problem: Education: Goal: Mental status will improve 05/08/2024 0725 by Scarlet Jannett Humbles, RN Outcome: Progressing 05/08/2024 0721 by Jazmon Kos Abisola, RN Outcome: Progressing

## 2024-05-08 NOTE — Plan of Care (Signed)
   Problem: Education: Goal: Emotional status will improve Outcome: Progressing Goal: Mental status will improve Outcome: Progressing Goal: Verbalization of understanding the information provided will improve Outcome: Progressing

## 2024-05-08 NOTE — Plan of Care (Signed)
   Problem: Education: Goal: Emotional status will improve Outcome: Progressing

## 2024-05-08 NOTE — Progress Notes (Signed)
   05/08/24 0900  Psych Admission Type (Psych Patients Only)  Admission Status Voluntary  Psychosocial Assessment  Patient Complaints None  Eye Contact Fair  Facial Expression Sad  Affect Depressed  Speech Logical/coherent  Interaction Isolative;Minimal;Guarded  Motor Activity Slow  Appearance/Hygiene Unremarkable  Behavior Characteristics Calm  Mood Depressed  Thought Process  Coherency WDL  Content WDL  Delusions None reported or observed  Perception WDL  Hallucination None reported or observed  Judgment Impaired  Confusion None  Danger to Self  Current suicidal ideation? Denies

## 2024-05-08 NOTE — Plan of Care (Signed)
  Problem: Education: Goal: Emotional status will improve 05/08/2024 1435 by Marty Eleanor NOVAK, RN Outcome: Progressing 05/08/2024 0921 by Marty Eleanor NOVAK, RN Outcome: Progressing

## 2024-05-09 DIAGNOSIS — F319 Bipolar disorder, unspecified: Secondary | ICD-10-CM | POA: Diagnosis not present

## 2024-05-09 LAB — THYROID PANEL WITH TSH
Free Thyroxine Index: 1.3 (ref 1.2–4.9)
T3 Uptake Ratio: 25 % (ref 24–39)
T4, Total: 5 ug/dL (ref 4.5–12.0)
TSH: 1.55 u[IU]/mL (ref 0.450–4.500)

## 2024-05-09 MED ORDER — PALIPERIDONE ER 3 MG PO TB24
3.0000 mg | ORAL_TABLET | Freq: Every day | ORAL | Status: DC
  Administered 2024-05-09 – 2024-05-10 (×2): 3 mg via ORAL
  Filled 2024-05-09 (×2): qty 1

## 2024-05-09 NOTE — Group Note (Signed)
 [facility] LCSW Group Therapy Note   Group Date: 05/09/2024 Start Time: 1300 End Time: 1400  Type of Therapy/Topic:  Group Therapy:  Feelings about Diagnosis  Participation Level:  Active    Description of Group:    This group will allow patients to explore their thoughts and feelings about diagnoses they have received. Patients will be guided to explore their level of understanding and acceptance of these diagnoses. Facilitator will encourage patients to process their thoughts and feelings about the reactions of others to their diagnosis, and will guide patients in identifying ways to discuss their diagnosis with significant others in their lives. This group will be process-oriented, with patients participating in exploration of their own experiences as well as giving and receiving support and challenge from other group members.   Therapeutic Goals: 1. Patient will demonstrate understanding of diagnosis as evidence by identifying two or more symptoms of the disorder:  2. Patient will be able to express two feelings regarding the diagnosis 3. Patient will demonstrate ability to communicate their needs through discussion and/or role plays  Summary of Patient Progress: Patient was present for the entirety of group. She was actively engaged in the discussion and her comments furthered the conversation. She presented with some insight into the topic and herself. Pt appeared open and receptive to feedback/comments from both her peers and the facilitator.   Therapeutic Modalities:   Cognitive Behavioral Therapy Brief Therapy Feelings Identification    Nadara JONELLE Fam, LCSW

## 2024-05-09 NOTE — Progress Notes (Signed)
 Patient presents: Flat affect but cooperative and med compliant. Patient visible in milieu.    SI/HI/AVH: Passive SI with no plan or intent. Denies HI and A/V/H.    Plan: Denies   Groups attended: 3/3   Appetite: Adequate. Attended meals.   Sleep: No sleep disturbances reported.   PRNS: N/A   Disturbances: Patient started to have a verbal altercation with another patient over what to watch on TV in the dayroom. Patient pulled aside and allowed to vent about the issue and able to de-escalate verbal altercation. Patient able to walk back in dayroom with no further issues.   Questions/concerns: Patient stated I hate Abilify  and requested to have medication discontinued. Provider notified and patient appears to be tolerating invega  much better. No further concerns verbalized at this time.   VS: BP 116/86   Pulse 83   Temp 98.5 F (36.9 C) (Oral)   Resp 18   Ht 5' 4 (1.626 m)   Wt 109.3 kg   SpO2 99%   BMI 41.37 kg/m

## 2024-05-09 NOTE — Group Note (Signed)
 Recreation Therapy Group Note   Group Topic:Health and Wellness  Group Date: 05/09/2024 Start Time: 1000 End Time: 1100 Facilitators: Celestia Jeoffrey BRAVO, LRT, CTRS Location: Courtyard  Group Description: [employer]. LRT and patients played games of basketball, drew with chalk, and played corn hole while outside in the courtyard while getting fresh air and sunlight. Music was being played in the background. LRT and peers conversed about different games they have played before, what they do in their free time and anything else that is on their minds. LRT encouraged pts to drink water after being outside, sweating and getting their heart rate up.  Goal Area(s) Addressed: Patient will build on frustration tolerance skills. Patients will partake in a competitive play game with peers. Patients will gain knowledge of new leisure interest/hobby.    Affect/Mood: Appropriate   Participation Level: Active   Participation Quality: Independent   Behavior: Appropriate   Speech/Thought Process: Coherent   Insight: Good   Judgement: Good   Modes of Intervention: Activity   Patient Response to Interventions:  Receptive   Education Outcome:  Acknowledges education   Clinical Observations/Individualized Feedback: Lauren Poole was active in their participation of session activities and group discussion. Pt interacted well with LRT and peers duration of session.    Plan: Continue to engage patient in RT group sessions 2-3x/week.   Jeoffrey BRAVO Celestia, LRT, CTRS 05/09/2024 11:11 AM

## 2024-05-09 NOTE — Group Note (Signed)
 Date:  05/09/2024 Time:  2:13 PM  Group Topic/Focus:  Rediscovering Joy:   The focus of this group is to explore various ways to relieve stress in a positive manner.    Participation Level:  Active  Participation Quality:  Appropriate  Affect:  Appropriate  Cognitive:  Alert  Insight: Appropriate  Engagement in Group:  Engaged  Modes of Intervention:  Activity, Discussion, and Education  Additional Comments:    Skippy LITTIE Bennett 05/09/2024, 2:13 PM

## 2024-05-09 NOTE — Group Note (Signed)
 Date:  05/09/2024 Time:  8:49 PM  Group Topic/Focus:  Building Self Esteem:   The Focus of this group is helping patients become aware of the effects of self-esteem on their lives, the things they and others do that enhance or undermine their self-esteem, seeing the relationship between their level of self-esteem and the choices they make and learning ways to enhance self-esteem.    Participation Level:  Active  Participation Quality:  Appropriate, Attentive, Sharing, and Supportive  Affect:  Appropriate  Cognitive:  Appropriate  Insight: Appropriate and Good  Engagement in Group:  Engaged and Supportive  Modes of Intervention:  Discussion and Role-play  Additional Comments:     Kerri Katz 05/09/2024, 8:49 PM

## 2024-05-09 NOTE — Group Note (Signed)
 Recreation Therapy Group Note   Group Topic:Healthy Support Systems  Group Date: 05/09/2024 Start Time: 1530 End Time: 1620 Facilitators: Celestia Jeoffrey FORBES ARTICE, CTRS Location: Craft Room  Group Description: Straw Bridge. In groups or individually, patients were given 10 plastic drinking straws and an equal length of masking tape. Using the materials provided, patients were instructed to build a free-standing bridge-like structure to suspend an everyday item (ex: deck of cards) off the floor or table surface. All materials were required to be used in Secondary school teacher. LRT facilitated post-activity discussion reviewing the importance of having strong and healthy support systems in our lives. LRT discussed how the people in our lives serve as the tape and the deck of cards we placed on top of our straw structure are the stressors we face in daily life. LRT and pts discussed what happens in our life when things get too heavy for us , and we don't have strong supports outside of the hospital. Pt shared 2 of their healthy supports in their life aloud in the group.   Goal Area(s) Addressed:  Patient will identify 2 healthy supports in their life. Patient will identify skills to successfully complete activity. Patient will identify correlation of this activity to life post-discharge.  Patient will build on frustration tolerance skills. Patient will increase team building and communication skills.    Affect/Mood: Appropriate   Participation Level: Active and Engaged   Participation Quality: Independent   Behavior: Appropriate, Calm, and Cooperative   Speech/Thought Process: Coherent   Insight: Good   Judgement: Good   Modes of Intervention: STEM Activity   Patient Response to Interventions:  Attentive, Engaged, and Receptive   Education Outcome:  Acknowledges education   Clinical Observations/Individualized Feedback: Lauren Poole was active in their participation of session activities and group  discussion. Pt identified reading and classical piano as healthy supports.    Plan: Continue to engage patient in RT group sessions 2-3x/week.   Jeoffrey FORBES Celestia, LRT, CTRS 05/09/2024 4:55 PM

## 2024-05-09 NOTE — Progress Notes (Signed)
 [facility] MD Progress Note  05/08/2024 10:27 AM Lauren Poole  MRN:  [id number]  33 year old female who presented to the ED via EMS following a suicide attempt by overdose on 04/30/24. She reported ingesting approximately 30 Aleve pills. After ingestion, the patient did not experience vomiting or other acute physical symptoms. She reports that initially she wanted to not wake up, but later became fearful and contacted a friend, who encouraged her to seek care in the ED. The patient reports she has struggled with chronic suicidal ideation for about 1 year.  She states things have been hard but declines to elaborate on specific triggers.   Subjective:  Chart reviewed, case discussed in multidisciplinary meeting, patient seen during rounds.   05/09/24: Patient seen for follow up. They are alert and oriented. They report they are not agreeable to continuing abilify . They are not agreeable to ongoing lab test required for Lithium , will discontinue both. Discussed invega  and LAI, patient is agreeable to trial of invega  with openess to transitioning to LAI. She denies SI/HI/AVH. Notes improving sleep and stable appetite.   05/08/2024: On interview today patient is noted to be sitting up in bed.  She is more engaged in interview today.  She reports she has not started Abilify  yet but is planning to take first dose this evening.  She has started lithium  and is tolerating this well.  She endorses symptoms of depression but denies persistent anxiety.  She denies current SI/HI/plan and denies hallucinations.  She notes most recent episode of passive SI was yesterday.  She does not voice any concerns or complaints today.  She discusses desire to do something productive with her life.  05/07/2024: Per nursing report patient did not come out and remained mostly to her room.  This Clinical research associate talked with the patient and her room.  Patient reports that she got Seroquel  yesterday and still thinks sleepy because of that.   She reports that she got lithium  today in the morning for the first time and would get Abilify  tonight.  Patient continues to report having suicidal ideations.  Reports that the last time she had suicidal ideations was yesterday in the evening time and she just woke up in the morning.  Still feeling depressed and rates her depression as 5 out of 10.  Patient reports that her normal self is when she rates her depression as 2 out of 10.  Continues to report that she has thoughts of shooting herself if she would have a gun.  But she does not have any access to guns.    05/06/2024:   per staff report patient has refused to take the Seroquel  at night yesterday.  Patient is still coming out and staying in her home.  Patient on interview reports that she came suicide attempt and reports that she had 1 previous suicide attempt this year.  Patient continues to report that she is depressed.  Patient reports that it is end of road for her.  Patient reports that she does not have access to gun but if she has done she would just get it over with.  Patient remained vague but reports that she has ongoing financial, family, life stressors.  Patient reports history consistent with bipolar 1   With manic episode lasting for 1 month but reports that these days she is not  having manic symptoms and mostly getting depressed.   9/19: On interview today patient is noted to be seated up in bed looking towards the window.  She is  more engaged in interview today than she has been in recent days.   She was compliant with Seroquel  dose last night, and states she feels that it is working.  She reports tolerating medication well without adverse effects.  She is sleeping well and reports appetite as normal.  She denies SI/HI/plan and denies hallucinations.  She rates depression as a 4 out of 10 and anxiety as a 0 out of 10 today.  She does not voice any concerns or complaints today.  9/18: On interview today patient is noted to be alert  and laying in bed.  She declines to communicate with provider verbally.  Patient responds to questions during interview with a head nod or head shake.  Patient declined medication last night per nursing report.  She denies SI/HI/plan and denies hallucinations via shaking her head to indicate 'no' when asked.  She declined to answer questions regarding sleep and appetite.  When asked if tolerating medication patient nods head to indicate 'yes'.  Patient declined to participate further in interview.  9/17: On interview today patient is noted to be laying in bed with hand covering her face.  She continues to be withdrawn and minimizes symptoms.  She denies current SI/HI/plan and denies hallucinations.  She states she is tolerating initiation of Seroquel  well.  Patient declines to answer further questions.  9/16: On interview, patient is noted to be laying in bed.  Per nursing report, she has primarily been isolated to her room since her arrival on the unit.  She endorses passive SI without current intent or plan.  She denies HI/plan and denies hallucinations.  She reports she is sleeping and eating well.  Interviewer discussed treatment options with patient to include medication.  Patient states she prefers to be restarted on Seroquel , which she has taken in the past with good response.  Patient does not voice any other complaints or concerns today.   Sleep: Patient declined to answer  Appetite: Patient declined to answer  Past Psychiatric History: see h&P Family History:  Family History  Problem Relation Age of Onset   Schizophrenia Sister    Social History:  Social History   Substance and Sexual Activity  Alcohol Use Yes   Alcohol/week: 4.0 standard drinks of alcohol   Types: 4 Glasses of wine per week     Social History   Substance and Sexual Activity  Drug Use Not Currently   Types: Marijuana    Social History   Socioeconomic History   Marital status: Single    Spouse name: Not on  file   Number of children: Not on file   Years of education: Not on file   Highest education level: Not on file  Occupational History   Not on file  Tobacco Use   Smoking status: Every Day    Current packs/day: 0.50    Average packs/day: 0.5 packs/day for 6.6 years (3.3 ttl pk-yrs)    Types: Cigarettes    Start date: 10/2017   Smokeless tobacco: Never  Vaping Use   Vaping status: Never Used  Substance and Sexual Activity   Alcohol use: Yes    Alcohol/week: 4.0 standard drinks of alcohol    Types: 4 Glasses of wine per week   Drug use: Not Currently    Types: Marijuana   Sexual activity: Yes    Birth control/protection: None  Other Topics Concern   Not on file  Social History Narrative   Not on file   Social Drivers of Health  Financial Resource Strain: Not on file  Food Insecurity: Food Insecurity Present (04/30/2024)   Hunger Vital Sign    Worried About Running Out of Food in the Last Year: Sometimes true    Ran Out of Food in the Last Year: Sometimes true  Transportation Needs: Unmet Transportation Needs (04/30/2024)   PRAPARE - Administrator, Civil Service (Medical): Yes    Lack of Transportation (Non-Medical): Yes  Physical Activity: Not on file  Stress: Not on file  Social Connections: Not on file   Past Medical History:  Past Medical History:  Diagnosis Date   Bipolar 1 disorder (HCC)    Chlamydia    Gonorrhea     Past Surgical History:  Procedure Laterality Date   NO PAST SURGERIES      Current Medications: Current Facility-Administered Medications  Medication Dose Route Frequency Provider Last Rate Last Admin   acetaminophen  (TYLENOL ) tablet 650 mg  650 mg Oral Q6H PRN Motley-Mangrum, Jadeka A, PMHNP       alum & mag hydroxide-simeth (MAALOX/MYLANTA) 200-200-20 MG/5ML suspension 30 mL  30 mL Oral Q4H PRN Motley-Mangrum, Jadeka A, PMHNP       haloperidol  (HALDOL ) tablet 5 mg  5 mg Oral TID PRN Motley-Mangrum, Jadeka A, PMHNP       And    diphenhydrAMINE  (BENADRYL ) capsule 50 mg  50 mg Oral TID PRN Motley-Mangrum, Jadeka A, PMHNP       haloperidol  lactate (HALDOL ) injection 5 mg  5 mg Intramuscular TID PRN Motley-Mangrum, Jadeka A, PMHNP       And   diphenhydrAMINE  (BENADRYL ) injection 50 mg  50 mg Intramuscular TID PRN Motley-Mangrum, Jadeka A, PMHNP       And   LORazepam  (ATIVAN ) injection 2 mg  2 mg Intramuscular TID PRN Motley-Mangrum, Jadeka A, PMHNP       haloperidol  lactate (HALDOL ) injection 10 mg  10 mg Intramuscular TID PRN Motley-Mangrum, Jadeka A, PMHNP       And   diphenhydrAMINE  (BENADRYL ) injection 50 mg  50 mg Intramuscular TID PRN Motley-Mangrum, Jadeka A, PMHNP       And   LORazepam  (ATIVAN ) injection 2 mg  2 mg Intramuscular TID PRN Motley-Mangrum, Jadeka A, PMHNP       magnesium  hydroxide (MILK OF MAGNESIA) suspension 30 mL  30 mL Oral Daily PRN Motley-Mangrum, Jadeka A, PMHNP       paliperidone  (INVEGA ) 24 hr tablet 3 mg  3 mg Oral Daily Torien Ramroop E, PA-C       traZODone  (DESYREL ) tablet 50 mg  50 mg Oral QHS PRN Motley-Mangrum, Jadeka A, PMHNP   50 mg at 05/08/24 2300    Lab Results:  Results for orders placed or performed during the hospital encounter of 04/30/24 (from the past 48 hours)  Thyroid  Panel With TSH     Status: None   Collection Time: 05/08/24 10:47 AM  Result Value Ref Range   TSH 1.550 0.450 - 4.500 uIU/mL   T4, Total 5.0 4.5 - 12.0 ug/dL   T3 Uptake Ratio 25 24 - 39 %   Free Thyroxine Index 1.3 1.2 - 4.9    Comment: (NOTE) Performed At: [facility]  [address] [place], [place] [id number] Jennette Shorter MD Ey:[id number]   VITAMIN D  25 Hydroxy (Vit-D Deficiency, Fractures)     Status: Abnormal   Collection Time: 05/08/24 10:47 AM  Result Value Ref Range   Vit D, 25-Hydroxy 27.58 (L) 30 - 100 ng/mL    Comment: (  NOTE) Vitamin D  deficiency has been defined by the Institute of Medicine  and an Endocrine Society practice guideline as a level of serum 25-OH   vitamin D  less than 20 ng/mL (1,2). The Endocrine Society went on to  further define vitamin D  insufficiency as a level between 21 and 29  ng/mL (2).  1. IOM (Institute of Medicine). 2010. Dietary reference intakes for  calcium and D. [place]: [employer]. 2. Holick MF, Binkley Magalia, Bischoff-Ferrari HA, et al. Evaluation,  treatment, and prevention of vitamin D  deficiency: an Endocrine  Society clinical practice guideline, JCEM. 2011 Jul; 96(7): 1911-30.  Performed at [facility], [address] [place], [place] [address]     Blood Alcohol level:  Lab Results  Component Value Date   [facility] <15 04/30/2024   ETH <15 04/09/2024    Metabolic Disorder Labs: Lab Results  Component Value Date   HGBA1C 5.1 10/31/2023   MPG 99.67 10/31/2023   No results found for: PROLACTIN Lab Results  Component Value Date   CHOL 136 10/31/2023   TRIG 75 10/31/2023   HDL 37 (L) 10/31/2023   CHOLHDL 3.7 10/31/2023   VLDL 15 10/31/2023   LDLCALC 84 10/31/2023    Physical Findings: AIMS:  , ,  ,  ,    CIWA:    COWS:      Psychiatric Specialty Exam:  Presentation   Mental status exam - Appearance- casual grooming /dress. Appears stated age.  In hospital clothes.  Alert and oriented x3. Behavior - cooperative.  No acute distress. Motor activity- psychomotor retardation noted Speech -  slow rate rhythm volume and tone. [place] - within normal limits Mood-  "depressed" Affect -  constricted, congruent to the mood. Thought Perception-No auditory and visual hallucinations. Does not appear to be responding to internal stimuli . Thought content -  Reports having suicidal thoughts on and off.  Reports that if she has gun she would shoot herself. Thought process -  Latent Association intact . Memory -intact . Fund of knowledge -intact . Attention -intact . Insight and judgment  poor Estimated Level of intellectual functioning-average. Estimated level of  functioning-average.   Musculoskeletal: Strength & Muscle Tone: within normal limits Gait & Station: normal Assets  Assets: Communication Skills    Physical Exam: Physical Exam ROS Blood pressure 109/75, pulse 81, temperature 98.5 F (36.9 C), temperature source Oral, resp. rate 18, height 5' 4 (1.626 m), weight 109.3 kg, SpO2 98%. Body mass index is 41.37 kg/m.  Diagnosis: Principal Problem:   Bipolar 1 disorder (HCC)   PLAN: Safety and Monitoring:  -- Voluntary admission to inpatient psychiatric unit for safety, stabilization and treatment  -- Daily contact with patient to assess and evaluate symptoms and progress in treatment  -- Patient's case to be discussed in multi-disciplinary team meeting  -- Observation Level : q15 minute checks  -- Vital signs:  q12 hours  -- Precautions: suicide, elopement, and assault -- Encouraged patient to participate in unit milieu and in scheduled group therapies  2. Psychiatric Diagnoses and Treatment:  Bipolar disorder  D/C lithium  300 mg b.I.d. and Abilify  5 mg at bedtime.  Labs ordered for thyroid  panel and vitamin D .  Start invega  3 mg daily             The risks/benefits/side-effects/alternatives to this medication were discussed in detail with the patient and time was given for questions. The patient consents to medication trial. -- Metabolic profile and EKG monitoring  obtained while on an atypical antipsychotic  -- Encouraged patient to participate in unit milieu and in scheduled group therapies                    3. Medical Issues Being Addressed:  No medical issues identified at this time.   4. Discharge Planning:   -- Social work and case management to assist with discharge planning and identification of hospital follow-up needs prior to discharge  -- Estimated LOS: 1-2 weeks  Donnice FORBES Right, PA-C 05/09/2024, 10:27 AM

## 2024-05-09 NOTE — Progress Notes (Signed)
 Pt calm and pleasant during assessment denying HI/AVH. Pt endorses passive SI with no plan, verbally contracting for safety. Pt observed interacting appropriately with staff and peers on the unit. Pt didn't have medications scheduled tonight and hasn't requested anything PRN as of now. Pt given education, support, and encouragement to be active in her treatment plan. Pt being monitored Q 15 minutes for safety per unit protocol, remains safe on the unit

## 2024-05-09 NOTE — Plan of Care (Signed)
   Problem: Activity: Goal: Interest or engagement in activities will improve Outcome: Progressing   Problem: Health Behavior/Discharge Planning: Goal: Compliance with treatment plan for underlying cause of condition will improve Outcome: Progressing   Problem: Safety: Goal: Periods of time without injury will increase Outcome: Progressing

## 2024-05-10 DIAGNOSIS — F319 Bipolar disorder, unspecified: Secondary | ICD-10-CM | POA: Diagnosis not present

## 2024-05-10 MED ORDER — PALIPERIDONE ER 3 MG PO TB24
6.0000 mg | ORAL_TABLET | Freq: Every day | ORAL | Status: DC
  Administered 2024-05-11 – 2024-05-12 (×2): 6 mg via ORAL
  Filled 2024-05-10 (×2): qty 2

## 2024-05-10 NOTE — Progress Notes (Signed)
 Patient presents: Appropriate and cooperative. Med compliant. Visible in milieu.    SI/HI/AVH: Denies   Plan: Denies   Groups attended: 2/2   Appetite: Adequate. Attended meals.   Sleep: No sleep disturbances reported.   PRNS: N/A   Disturbances: No disturbances. Patient remains cooperative in milieu.    Questions/concerns: No further questions or concerns.   VS: BP 114/75 (BP Location: Right Arm)   Pulse 91   Temp 97.6 F (36.4 C)   Resp 18   Ht 5' 4 (1.626 m)   Wt 109.3 kg   SpO2 100%   BMI 41.37 kg/m

## 2024-05-10 NOTE — Group Note (Signed)
 LCSW Group Therapy Note  Group Date: 05/10/2024 Start Time: 1248 End Time: 1347   Type of Therapy and Topic:  Group Therapy: Positive Affirmations  Participation Level:  Minimal   Description of Group:   This group addressed positive affirmation towards self and others.  Patients went around the room and identified two positive things about themselves and two positive things about a peer in the room.  Patients reflected on how it felt to share something positive with others, to identify positive things about themselves, and to hear positive things from others/ Patients were encouraged to have a daily reflection of positive characteristics or circumstances.   Therapeutic Goals: Patients will verbalize two of their positive qualities Patients will demonstrate empathy for others by stating two positive qualities about a peer in the group Patients will verbalize their feelings when voicing positive self affirmations and when voicing positive affirmations of others Patients will discuss the potential positive impact on their wellness/recovery of focusing on positive traits of self and others.  Summary of Patient Progress:  Patient minimally engaged in the discussion and was able to identify positive affirmations about themself as well as other group members. Patient demonstrated minimal insight into the subject matter, was respectful of peers, participated throughout the entire session.  Therapeutic Modalities:   Cognitive Behavioral Therapy Motivational Interviewing    Lauren Poole, [place] 05/10/2024  1:49 PM

## 2024-05-10 NOTE — Plan of Care (Signed)
   Problem: Education: Goal: Emotional status will improve Outcome: Progressing Goal: Mental status will improve Outcome: Progressing

## 2024-05-10 NOTE — Progress Notes (Signed)
 [facility] MD Progress Note  05/08/2024 10:54 AM Lauren Poole  MRN:  [id number]  33 year old female who presented to the ED via EMS following a suicide attempt by overdose on 04/30/24. She reported ingesting approximately 30 Aleve pills. After ingestion, the patient did not experience vomiting or other acute physical symptoms. She reports that initially she wanted to not wake up, but later became fearful and contacted a friend, who encouraged her to seek care in the ED. The patient reports she has struggled with chronic suicidal ideation for about 1 year.  She states things have been hard but declines to elaborate on specific triggers.   Subjective:  Chart reviewed, case discussed in multidisciplinary meeting, patient seen during rounds.   05/10/24: Patient seen for follow up. They are alert and oriented. They are pleasant and cooperative on exam. They feel that the invega  is working well and are agreeable to continued titration. They noted some passive si today without plan or intent. Disussed medication options for depression and patient indicates she is only willing to take the Invega  at this time but that she is open to an LAI. She denies HI and AVH. Discussed ACTT services given the multiple recent hospitalizations. Sleep and appetite are stable. Had an argument with another pt during group yesterday but feels that it was because the pt was judging them. No behavioral PRNS overnight.   05/09/24: Patient seen for follow up. They are alert and oriented. They report they are not agreeable to continuing abilify . They are not agreeable to ongoing lab test required for Lithium , will discontinue both. Discussed invega  and LAI, patient is agreeable to trial of invega  with openess to transitioning to LAI. She denies SI/HI/AVH. Notes improving sleep and stable appetite.   05/08/2024: On interview today patient is noted to be sitting up in bed.  She is more engaged in interview today.  She reports she has not  started Abilify  yet but is planning to take first dose this evening.  She has started lithium  and is tolerating this well.  She endorses symptoms of depression but denies persistent anxiety.  She denies current SI/HI/plan and denies hallucinations.  She notes most recent episode of passive SI was yesterday.  She does not voice any concerns or complaints today.  She discusses desire to do something productive with her life.  05/07/2024: Per nursing report patient did not come out and remained mostly to her room.  This Clinical research associate talked with the patient and her room.  Patient reports that she got Seroquel  yesterday and still thinks sleepy because of that.  She reports that she got lithium  today in the morning for the first time and would get Abilify  tonight.  Patient continues to report having suicidal ideations.  Reports that the last time she had suicidal ideations was yesterday in the evening time and she just woke up in the morning.  Still feeling depressed and rates her depression as 5 out of 10.  Patient reports that her normal self is when she rates her depression as 2 out of 10.  Continues to report that she has thoughts of shooting herself if she would have a gun.  But she does not have any access to guns.    05/06/2024:   per staff report patient has refused to take the Seroquel  at night yesterday.  Patient is still coming out and staying in her home.  Patient on interview reports that she came suicide attempt and reports that she had 1 previous suicide attempt this  year.  Patient continues to report that she is depressed.  Patient reports that it is end of road for her.  Patient reports that she does not have access to gun but if she has done she would just get it over with.  Patient remained vague but reports that she has ongoing financial, family, life stressors.  Patient reports history consistent with bipolar 1   With manic episode lasting for 1 month but reports that these days she is not  having  manic symptoms and mostly getting depressed.   9/19: On interview today patient is noted to be seated up in bed looking towards the window.  She is more engaged in interview today than she has been in recent days.   She was compliant with Seroquel  dose last night, and states she feels that it is working.  She reports tolerating medication well without adverse effects.  She is sleeping well and reports appetite as normal.  She denies SI/HI/plan and denies hallucinations.  She rates depression as a 4 out of 10 and anxiety as a 0 out of 10 today.  She does not voice any concerns or complaints today.  9/18: On interview today patient is noted to be alert and laying in bed.  She declines to communicate with provider verbally.  Patient responds to questions during interview with a head nod or head shake.  Patient declined medication last night per nursing report.  She denies SI/HI/plan and denies hallucinations via shaking her head to indicate 'no' when asked.  She declined to answer questions regarding sleep and appetite.  When asked if tolerating medication patient nods head to indicate 'yes'.  Patient declined to participate further in interview.  9/17: On interview today patient is noted to be laying in bed with hand covering her face.  She continues to be withdrawn and minimizes symptoms.  She denies current SI/HI/plan and denies hallucinations.  She states she is tolerating initiation of Seroquel  well.  Patient declines to answer further questions.  9/16: On interview, patient is noted to be laying in bed.  Per nursing report, she has primarily been isolated to her room since her arrival on the unit.  She endorses passive SI without current intent or plan.  She denies HI/plan and denies hallucinations.  She reports she is sleeping and eating well.  Interviewer discussed treatment options with patient to include medication.  Patient states she prefers to be restarted on Seroquel , which she has taken in the  past with good response.  Patient does not voice any other complaints or concerns today.   Sleep: Patient declined to answer  Appetite: Patient declined to answer  Past Psychiatric History: see h&P Family History:  Family History  Problem Relation Age of Onset   Schizophrenia Sister    Social History:  Social History   Substance and Sexual Activity  Alcohol Use Yes   Alcohol/week: 4.0 standard drinks of alcohol   Types: 4 Glasses of wine per week     Social History   Substance and Sexual Activity  Drug Use Not Currently   Types: Marijuana    Social History   Socioeconomic History   Marital status: Single    Spouse name: Not on file   Number of children: Not on file   Years of education: Not on file   Highest education level: Not on file  Occupational History   Not on file  Tobacco Use   Smoking status: Every Day    Current packs/day: 0.50  Average packs/day: 0.5 packs/day for 6.6 years (3.3 ttl pk-yrs)    Types: Cigarettes    Start date: 10/2017   Smokeless tobacco: Never  Vaping Use   Vaping status: Never Used  Substance and Sexual Activity   Alcohol use: Yes    Alcohol/week: 4.0 standard drinks of alcohol    Types: 4 Glasses of wine per week   Drug use: Not Currently    Types: Marijuana   Sexual activity: Yes    Birth control/protection: None  Other Topics Concern   Not on file  Social History Narrative   Not on file   Social Drivers of Health   Financial Resource Strain: Not on file  Food Insecurity: Food Insecurity Present (04/30/2024)   Hunger Vital Sign    Worried About Running Out of Food in the Last Year: Sometimes true    Ran Out of Food in the Last Year: Sometimes true  Transportation Needs: Unmet Transportation Needs (04/30/2024)   PRAPARE - Administrator, Civil Service (Medical): Yes    Lack of Transportation (Non-Medical): Yes  Physical Activity: Not on file  Stress: Not on file  Social Connections: Not on file   Past  Medical History:  Past Medical History:  Diagnosis Date   Bipolar 1 disorder (HCC)    Chlamydia    Gonorrhea     Past Surgical History:  Procedure Laterality Date   NO PAST SURGERIES      Current Medications: Current Facility-Administered Medications  Medication Dose Route Frequency Provider Last Rate Last Admin   acetaminophen  (TYLENOL ) tablet 650 mg  650 mg Oral Q6H PRN Motley-Mangrum, Jadeka A, PMHNP       alum & mag hydroxide-simeth (MAALOX/MYLANTA) 200-200-20 MG/5ML suspension 30 mL  30 mL Oral Q4H PRN Motley-Mangrum, Jadeka A, PMHNP       haloperidol  (HALDOL ) tablet 5 mg  5 mg Oral TID PRN Motley-Mangrum, Jadeka A, PMHNP       And   diphenhydrAMINE  (BENADRYL ) capsule 50 mg  50 mg Oral TID PRN Motley-Mangrum, Jadeka A, PMHNP       haloperidol  lactate (HALDOL ) injection 5 mg  5 mg Intramuscular TID PRN Motley-Mangrum, Jadeka A, PMHNP       And   diphenhydrAMINE  (BENADRYL ) injection 50 mg  50 mg Intramuscular TID PRN Motley-Mangrum, Jadeka A, PMHNP       And   LORazepam  (ATIVAN ) injection 2 mg  2 mg Intramuscular TID PRN Motley-Mangrum, Jadeka A, PMHNP       haloperidol  lactate (HALDOL ) injection 10 mg  10 mg Intramuscular TID PRN Motley-Mangrum, Jadeka A, PMHNP       And   diphenhydrAMINE  (BENADRYL ) injection 50 mg  50 mg Intramuscular TID PRN Motley-Mangrum, Jadeka A, PMHNP       And   LORazepam  (ATIVAN ) injection 2 mg  2 mg Intramuscular TID PRN Motley-Mangrum, Jadeka A, PMHNP       magnesium  hydroxide (MILK OF MAGNESIA) suspension 30 mL  30 mL Oral Daily PRN Motley-Mangrum, Jadeka A, PMHNP       [START ON 05/11/2024] paliperidone  (INVEGA ) 24 hr tablet 6 mg  6 mg Oral Daily [place] Skalicky E, PA-C       traZODone  (DESYREL ) tablet 50 mg  50 mg Oral QHS PRN Motley-Mangrum, Jadeka A, PMHNP   50 mg at 05/08/24 2300    Lab Results:  No results found for this or any previous visit (from the past 48 hours).   Blood Alcohol level:  Lab Results  Component Value Date   [facility]  <15 04/30/2024   ETH <15 04/09/2024    Metabolic Disorder Labs: Lab Results  Component Value Date   HGBA1C 5.1 10/31/2023   MPG 99.67 10/31/2023   No results found for: PROLACTIN Lab Results  Component Value Date   CHOL 136 10/31/2023   TRIG 75 10/31/2023   HDL 37 (L) 10/31/2023   CHOLHDL 3.7 10/31/2023   VLDL 15 10/31/2023   LDLCALC 84 10/31/2023    Physical Findings: AIMS:  , ,  ,  ,    CIWA:    COWS:      Psychiatric Specialty Exam:  Presentation   Mental status exam - Appearance- casual grooming /dress. Appears stated age.  In hospital clothes.  Alert and oriented x3. Behavior - cooperative.  No acute distress. Motor activity- psychomotor retardation noted Speech -  slow rate rhythm volume and tone. [place] - within normal limits Mood-  "depressed" Affect -  constricted, congruent to the mood. Thought Perception-No auditory and visual hallucinations. Does not appear to be responding to internal stimuli . Thought content -  Reports having suicidal thoughts on and off.  Reports that if she has gun she would shoot herself. Thought process -  Latent Association intact . Memory -intact . Fund of knowledge -intact . Attention -intact . Insight and judgment  poor Estimated Level of intellectual functioning-average. Estimated level of functioning-average.   Musculoskeletal: Strength & Muscle Tone: within normal limits Gait & Station: normal Assets  Assets: Communication Skills    Physical Exam: Physical Exam ROS Blood pressure 116/86, pulse 83, temperature 98.5 F (36.9 C), temperature source Oral, resp. rate 18, height 5' 4 (1.626 m), weight 109.3 kg, SpO2 99%. Body mass index is 41.37 kg/m.  Diagnosis: Principal Problem:   Bipolar 1 disorder (HCC)   PLAN: Safety and Monitoring:  -- Voluntary admission to inpatient psychiatric unit for safety, stabilization and treatment  -- Daily contact with patient to assess and evaluate symptoms and  progress in treatment  -- Patient's case to be discussed in multi-disciplinary team meeting  -- Observation Level : q15 minute checks  -- Vital signs:  q12 hours  -- Precautions: suicide, elopement, and assault -- Encouraged patient to participate in unit milieu and in scheduled group therapies  2. Psychiatric Diagnoses and Treatment:  Bipolar disorder  D/C lithium  300 mg b.I.d. and Abilify  5 mg at bedtime.  Labs ordered for thyroid  panel and vitamin D .  Increase Invega  to 6 mg daily.              The risks/benefits/side-effects/alternatives to this medication were discussed in detail with the patient and time was given for questions. The patient consents to medication trial. -- Metabolic profile and EKG monitoring obtained while on an atypical antipsychotic  -- Encouraged patient to participate in unit milieu and in scheduled group therapies                    3. Medical Issues Being Addressed:  No medical issues identified at this time.   4. Discharge Planning:   -- Social work and case management to assist with discharge planning and identification of hospital follow-up needs prior to discharge  -- Estimated LOS: 1-2 weeks  Donnice FORBES Right, PA-C 05/10/2024, 10:54 AM

## 2024-05-10 NOTE — Group Note (Signed)
 Date:  05/10/2024 Time:  5:03 PM  Group Topic/Focus:  Activity Group: The focus of the group is to promote activity and encourage patients to go outside to the courtyard to get some fresh air and some exercise.    Participation Level:  Active  Participation Quality:  Appropriate  Affect:  Appropriate  Cognitive:  Appropriate  Insight: Appropriate  Engagement in Group:  Engaged  Modes of Intervention:  Activity  Additional Comments:    Lauren Poole Para Cossey 05/10/2024, 5:03 PM

## 2024-05-10 NOTE — Group Note (Signed)
 Date:  05/10/2024 Time:  4:57 PM  Group Topic/Focus:  Personal Choices and Values:   The focus of this group is to help patients assess and explore the importance of values in their lives, how their values affect their decisions, how they express their values and what opposes their expression.    Participation Level:  Active  Participation Quality:  Appropriate  Affect:  Appropriate  Cognitive:  Appropriate  Insight: Appropriate  Engagement in Group:  Engaged  Modes of Intervention:  Activity  Additional Comments:    Camellia [name] Yvett Rossel 05/10/2024, 4:57 PM

## 2024-05-10 NOTE — Plan of Care (Signed)
Problem: Education: Goal: Emotional status will improve Outcome: Progressing Goal: Mental status will improve Outcome: Progressing   Problem: Activity: Goal: Interest or engagement in activities will improve Outcome: Progressing Goal: Sleeping patterns will improve Outcome: Progressing   Problem: Health Behavior/Discharge Planning: Goal: Compliance with treatment plan for underlying cause of condition will improve Outcome: Progressing   Problem: Safety: Goal: Periods of time without injury will increase Outcome: Progressing

## 2024-05-10 NOTE — Group Note (Signed)
 Date:  05/10/2024 Time:  8:35 PM  Group Topic/Focus:  Wrap-Up Group:   The focus of this group is to help patients review their daily goal of treatment and discuss progress on daily workbooks.    Participation Level:  Active  Participation Quality:  Appropriate and Attentive  Affect:  Appropriate  Cognitive:  Alert and Appropriate  Insight: Appropriate and Good  Engagement in Group:  Engaged  Modes of Intervention:  Orientation  Additional Comments:    Bonnielee LOISE Pepper 05/10/2024, 8:35 PM

## 2024-05-11 DIAGNOSIS — F319 Bipolar disorder, unspecified: Secondary | ICD-10-CM | POA: Diagnosis not present

## 2024-05-11 NOTE — Progress Notes (Signed)
   05/11/24 2200  Psych Admission Type (Psych Patients Only)  Admission Status Voluntary  Psychosocial Assessment  Patient Complaints None  Eye Contact Fair  Facial Expression Flat  Affect Appropriate to circumstance  Speech Logical/coherent  Interaction Assertive  Motor Activity Other (Comment) (WDL)  Appearance/Hygiene Unremarkable  Behavior Characteristics Cooperative;Appropriate to situation  Mood Pleasant  Thought Process  Coherency WDL  Content WDL  Delusions None reported or observed  Perception WDL  Hallucination None reported or observed  Judgment Limited  Confusion None  Danger to Self  Current suicidal ideation? Denies  Agreement Not to Harm Self Yes  Description of Agreement Verbal  Danger to Others  Danger to Others None reported or observed

## 2024-05-11 NOTE — Plan of Care (Signed)
  Problem: Education: Goal: Emotional status will improve Outcome: Progressing   Problem: Education: Goal: Mental status will improve Outcome: Progressing   Problem: Activity: Goal: Interest or engagement in activities will improve Outcome: Progressing   Problem: Coping: Goal: Ability to demonstrate self-control will improve Outcome: Progressing   Problem: Physical Regulation: Goal: Ability to maintain clinical measurements within normal limits will improve Outcome: Progressing   Problem: Safety: Goal: Periods of time without injury will increase Outcome: Progressing

## 2024-05-11 NOTE — Plan of Care (Signed)
   Problem: Education: Goal: Knowledge of [facility] Education information/materials will improve Outcome: Progressing Goal: Emotional status will improve Outcome: Progressing Goal: Mental status will improve Outcome: Progressing Goal: Verbalization of understanding the information provided will improve Outcome: Progressing

## 2024-05-11 NOTE — Group Note (Signed)
 Date:  05/11/2024 Time:  6:46 PM  Group Topic/Focus:  Wellness Toolbox:   The focus of this group is to discuss various aspects of wellness, balancing those aspects and exploring ways to increase the ability to experience wellness.  Patients will create a wellness toolbox for use upon discharge.    Participation Level:  Minimal  Participation Quality:  Appropriate  Affect:  Appropriate  Cognitive:  Appropriate  Insight: Appropriate  Engagement in Group:  Engaged  Modes of Intervention:  Activity and Socialization  Additional Comments:    Deitra Caron Mainland 05/11/2024, 6:46 PM

## 2024-05-11 NOTE — Group Note (Signed)
 LCSW Group Therapy Note  Group Date: 05/11/2024 Start Time: 1315 End Time: 1400   Type of Therapy and Topic:  Group Therapy: Anger Cues and Responses  Participation Level:  Active   Description of Group:   In this group, patients learned how to recognize the physical, cognitive, emotional, and behavioral responses they have to anger-provoking situations.  They identified a recent time they became angry and how they reacted.  They analyzed how their reaction was possibly beneficial and how it was possibly unhelpful.  The group discussed a variety of healthier coping skills that could help with such a situation in the future.  Focus was placed on how helpful it is to recognize the underlying emotions to our anger, because working on those can lead to a more permanent solution as well as our ability to focus on the important rather than the urgent.  Therapeutic Goals: Patients will remember their last incident of anger and how they felt emotionally and physically, what their thoughts were at the time, and how they behaved. Patients will identify how their behavior at that time worked for them, as well as how it worked against them. Patients will explore possible new behaviors to use in future anger situations. Patients will learn that anger itself is normal and cannot be eliminated, and that healthier reactions can assist with resolving conflict rather than worsening situations.  Summary of Patient Progress:   Patient was active during the group.  She shared a recent occurrence wherein feeling taken advantage of led to anger. She demonstrated fair insight into the subject matter, was respectful of peers, and participated throughout the entire session.  Therapeutic Modalities:   Cognitive Behavioral Therapy    Sherryle JINNY Margo, LCSW 05/11/2024  2:50 PM

## 2024-05-11 NOTE — Progress Notes (Signed)
   05/11/24 1600  Psych Admission Type (Psych Patients Only)  Admission Status Voluntary  Psychosocial Assessment  Patient Complaints None  Eye Contact Fair  Facial Expression Flat  Affect Appropriate to circumstance  Speech Logical/coherent  Interaction Assertive  Motor Activity Other (Comment) (WDL)  Appearance/Hygiene Unremarkable  Behavior Characteristics Cooperative;Appropriate to situation  Mood Pleasant  Thought Process  Coherency WDL  Content WDL  Delusions None reported or observed  Perception WDL  Hallucination None reported or observed  Judgment Poor  Confusion None  Danger to Self  Current suicidal ideation? Denies  Agreement Not to Harm Self Yes  Description of Agreement Verbal  Danger to Others  Danger to Others None reported or observed   Lauren Poole was present in the milieu and participated in unit groups and activities. She reports some mild dizziness from her medication, but states that otherwise she is feeling fine at this time.

## 2024-05-11 NOTE — [facility] Counselor (Signed)
 CSW met with pt briefly to discuss ACTT services. She shared that she felt like referrals had been completed last time that she was in the hospital. Pt was agreeable to referrals being made. No other concerns expressed. Contact ended without incident.   ACTT referrals were completed for [facility], and [facility].   Nadara SAUNDERS. Chaim, MSW, LCSW, LCAS 05/11/2024 3:54 PM

## 2024-05-11 NOTE — Group Note (Signed)
 Recreation Therapy Group Note   Group Topic:Relaxation  Group Date: 05/11/2024 Start Time: 1530 End Time: 1610 Facilitators: Celestia Jeoffrey BRAVO, LRT, CTRS Location: Dayroom  Group Description: PMR (Progressive Muscle Relaxation). LRT educates patients on what PMR is and the benefits that come from it. Patients are asked to sit with their feet flat on the floor while sitting up and all the way back in their chair, if possible. LRT and pts follow a prompt through a speaker that requires you to tense and release different muscles in their body and focus on their breathing. During session, lights are off and soft music is being played. Pts are given a stress ball to use if needed.   Goal Area(s) Addressed:  Patients will be able to describe progressive muscle relaxation.  Patient will practice using relaxation technique. Patient will identify a new coping skill.  Patient will follow multistep directions to reduce anxiety and stress.   Affect/Mood: Appropriate   Participation Level: Active and Engaged   Participation Quality: Independent   Behavior: Appropriate, Calm, and Cooperative   Speech/Thought Process: Coherent   Insight: Good   Judgement: Fair    Modes of Intervention: Education and Exploration   Patient Response to Interventions:  Attentive, Engaged, and Receptive   Education Outcome:  Acknowledges education   Clinical Observations/Individualized Feedback: Lauren Poole was active in their participation of session activities and group discussion. Pt completed all exercises as prompted.    Plan: Continue to engage patient in RT group sessions 2-3x/week.   [address], LRT, CTRS 05/11/2024 5:10 PM

## 2024-05-11 NOTE — Plan of Care (Signed)
   Problem: Education: Goal: Emotional status will improve Outcome: Progressing Goal: Mental status will improve Outcome: Progressing

## 2024-05-11 NOTE — Group Note (Signed)
 Recreation Therapy Group Note   Group Topic:General Recreation  Group Date: 05/11/2024 Start Time: 1000 End Time: 1100 Facilitators: Celestia Jeoffrey BRAVO, LRT, CTRS Location: Courtyard  Group Description: [employer]. LRT and patients played games of basketball, drew with chalk, and played corn hole while outside in the courtyard while getting fresh air and sunlight. Music was being played in the background. LRT and peers conversed about different games they have played before, what they do in their free time and anything else that is on their minds. LRT encouraged pts to drink water after being outside, sweating and getting their heart rate up.  Goal Area(s) Addressed: Patient will build on frustration tolerance skills. Patients will partake in a competitive play game with peers. Patients will gain knowledge of new leisure interest/hobby.     Affect/Mood: Appropriate   Participation Level: Active   Participation Quality: Independent   Behavior: Appropriate   Speech/Thought Process: Coherent   Insight: Fair   Judgement: Fair    Modes of Intervention: Activity   Patient Response to Interventions:  Receptive   Education Outcome:  Acknowledges education   Clinical Observations/Individualized Feedback: Lauren Poole was active in their participation of session activities and group discussion. Pt interacted well with LRT and peers duration of session.    Plan: Continue to engage patient in RT group sessions 2-3x/week.   Jeoffrey BRAVO Celestia, LRT, CTRS 05/11/2024 11:29 AM

## 2024-05-11 NOTE — Progress Notes (Signed)
 [facility] MD Progress Note  05/08/2024 1:43 PM Lauren Poole  MRN:  [id number]  33 year old female who presented to the ED via EMS following a suicide attempt by overdose on 04/30/24. She reported ingesting approximately 30 Aleve pills. After ingestion, the patient did not experience vomiting or other acute physical symptoms. She reports that initially she wanted to not wake up, but later became fearful and contacted a friend, who encouraged her to seek care in the ED. The patient reports she has struggled with chronic suicidal ideation for about 1 year.  She states things have been hard but declines to elaborate on specific triggers.   Subjective:  Chart reviewed, case discussed in multidisciplinary meeting, patient seen during rounds.   05/11/24: Per follow-up this morning they are alert and oriented.  They are pleasant and cooperative.  They continue to feel the Invega  is effective and they feel her thought processes been more linear and their mood has been more stable.  They continue to note some passive SI they deny plan or intent.  They had some dizziness this morning they are unclear on the timeframe with reference to getting the higher dose of Invega  today we will continue to monitor.  She does not appear to be internally preoccupied nor she observed to be responding to internal stimuli.  She denies HI.  Given multiple recent hospitalizations we will continue to monitor work towards setting patient up with ACT team and progress towards LAI in the coming days.  05/10/24: Patient seen for follow up. They are alert and oriented. They are pleasant and cooperative on exam. They feel that the invega  is working well and are agreeable to continued titration. They noted some passive si today without plan or intent. Disussed medication options for depression and patient indicates she is only willing to take the Invega  at this time but that she is open to an LAI. She denies HI and AVH. Discussed ACTT  services given the multiple recent hospitalizations. Sleep and appetite are stable. Had an argument with another pt during group yesterday but feels that it was because the pt was judging them. No behavioral PRNS overnight.   05/09/24: Patient seen for follow up. They are alert and oriented. They report they are not agreeable to continuing abilify . They are not agreeable to ongoing lab test required for Lithium , will discontinue both. Discussed invega  and LAI, patient is agreeable to trial of invega  with openess to transitioning to LAI. She denies SI/HI/AVH. Notes improving sleep and stable appetite.   05/08/2024: On interview today patient is noted to be sitting up in bed.  She is more engaged in interview today.  She reports she has not started Abilify  yet but is planning to take first dose this evening.  She has started lithium  and is tolerating this well.  She endorses symptoms of depression but denies persistent anxiety.  She denies current SI/HI/plan and denies hallucinations.  She notes most recent episode of passive SI was yesterday.  She does not voice any concerns or complaints today.  She discusses desire to do something productive with her life.  05/07/2024: Per nursing report patient did not come out and remained mostly to her room.  This Clinical research associate talked with the patient and her room.  Patient reports that she got Seroquel  yesterday and still thinks sleepy because of that.  She reports that she got lithium  today in the morning for the first time and would get Abilify  tonight.  Patient continues to report having suicidal ideations.  Reports that the last time she had suicidal ideations was yesterday in the evening time and she just woke up in the morning.  Still feeling depressed and rates her depression as 5 out of 10.  Patient reports that her normal self is when she rates her depression as 2 out of 10.  Continues to report that she has thoughts of shooting herself if she would have a gun.  But she  does not have any access to guns.    05/06/2024:   per staff report patient has refused to take the Seroquel  at night yesterday.  Patient is still coming out and staying in her home.  Patient on interview reports that she came suicide attempt and reports that she had 1 previous suicide attempt this year.  Patient continues to report that she is depressed.  Patient reports that it is end of road for her.  Patient reports that she does not have access to gun but if she has done she would just get it over with.  Patient remained vague but reports that she has ongoing financial, family, life stressors.  Patient reports history consistent with bipolar 1   With manic episode lasting for 1 month but reports that these days she is not  having manic symptoms and mostly getting depressed.   9/19: On interview today patient is noted to be seated up in bed looking towards the window.  She is more engaged in interview today than she has been in recent days.   She was compliant with Seroquel  dose last night, and states she feels that it is working.  She reports tolerating medication well without adverse effects.  She is sleeping well and reports appetite as normal.  She denies SI/HI/plan and denies hallucinations.  She rates depression as a 4 out of 10 and anxiety as a 0 out of 10 today.  She does not voice any concerns or complaints today.  9/18: On interview today patient is noted to be alert and laying in bed.  She declines to communicate with provider verbally.  Patient responds to questions during interview with a head nod or head shake.  Patient declined medication last night per nursing report.  She denies SI/HI/plan and denies hallucinations via shaking her head to indicate 'no' when asked.  She declined to answer questions regarding sleep and appetite.  When asked if tolerating medication patient nods head to indicate 'yes'.  Patient declined to participate further in interview.  9/17: On interview today  patient is noted to be laying in bed with hand covering her face.  She continues to be withdrawn and minimizes symptoms.  She denies current SI/HI/plan and denies hallucinations.  She states she is tolerating initiation of Seroquel  well.  Patient declines to answer further questions.  9/16: On interview, patient is noted to be laying in bed.  Per nursing report, she has primarily been isolated to her room since her arrival on the unit.  She endorses passive SI without current intent or plan.  She denies HI/plan and denies hallucinations.  She reports she is sleeping and eating well.  Interviewer discussed treatment options with patient to include medication.  Patient states she prefers to be restarted on Seroquel , which she has taken in the past with good response.  Patient does not voice any other complaints or concerns today.   Sleep: Patient declined to answer  Appetite: Patient declined to answer  Past Psychiatric History: see h&P Family History:  Family History  Problem Relation Age  of Onset   Schizophrenia Sister    Social History:  Social History   Substance and Sexual Activity  Alcohol Use Yes   Alcohol/week: 4.0 standard drinks of alcohol   Types: 4 Glasses of wine per week     Social History   Substance and Sexual Activity  Drug Use Not Currently   Types: Marijuana    Social History   Socioeconomic History   Marital status: Single    Spouse name: Not on file   Number of children: Not on file   Years of education: Not on file   Highest education level: Not on file  Occupational History   Not on file  Tobacco Use   Smoking status: Every Day    Current packs/day: 0.50    Average packs/day: 0.5 packs/day for 6.6 years (3.3 ttl pk-yrs)    Types: Cigarettes    Start date: 10/2017   Smokeless tobacco: Never  Vaping Use   Vaping status: Never Used  Substance and Sexual Activity   Alcohol use: Yes    Alcohol/week: 4.0 standard drinks of alcohol    Types: 4 Glasses  of wine per week   Drug use: Not Currently    Types: Marijuana   Sexual activity: Yes    Birth control/protection: None  Other Topics Concern   Not on file  Social History Narrative   Not on file   Social Drivers of Health   Financial Resource Strain: Not on file  Food Insecurity: Food Insecurity Present (04/30/2024)   Hunger Vital Sign    Worried About Running Out of Food in the Last Year: Sometimes true    Ran Out of Food in the Last Year: Sometimes true  Transportation Needs: Unmet Transportation Needs (04/30/2024)   PRAPARE - Administrator, Civil Service (Medical): Yes    Lack of Transportation (Non-Medical): Yes  Physical Activity: Not on file  Stress: Not on file  Social Connections: Not on file   Past Medical History:  Past Medical History:  Diagnosis Date   Bipolar 1 disorder (HCC)    Chlamydia    Gonorrhea     Past Surgical History:  Procedure Laterality Date   NO PAST SURGERIES      Current Medications: Current Facility-Administered Medications  Medication Dose Route Frequency Provider Last Rate Last Admin   acetaminophen  (TYLENOL ) tablet 650 mg  650 mg Oral Q6H PRN Motley-Mangrum, Jadeka A, PMHNP       alum & mag hydroxide-simeth (MAALOX/MYLANTA) 200-200-20 MG/5ML suspension 30 mL  30 mL Oral Q4H PRN Motley-Mangrum, Jadeka A, PMHNP       haloperidol  (HALDOL ) tablet 5 mg  5 mg Oral TID PRN Motley-Mangrum, Jadeka A, PMHNP       And   diphenhydrAMINE  (BENADRYL ) capsule 50 mg  50 mg Oral TID PRN Motley-Mangrum, Jadeka A, PMHNP       haloperidol  lactate (HALDOL ) injection 5 mg  5 mg Intramuscular TID PRN Motley-Mangrum, Jadeka A, PMHNP       And   diphenhydrAMINE  (BENADRYL ) injection 50 mg  50 mg Intramuscular TID PRN Motley-Mangrum, Jadeka A, PMHNP       And   LORazepam  (ATIVAN ) injection 2 mg  2 mg Intramuscular TID PRN Motley-Mangrum, Jadeka A, PMHNP       haloperidol  lactate (HALDOL ) injection 10 mg  10 mg Intramuscular TID PRN Motley-Mangrum,  Jadeka A, PMHNP       And   diphenhydrAMINE  (BENADRYL ) injection 50 mg  50 mg Intramuscular TID  PRN Motley-Mangrum, Jadeka A, PMHNP       And   LORazepam  (ATIVAN ) injection 2 mg  2 mg Intramuscular TID PRN Motley-Mangrum, Jadeka A, PMHNP       magnesium  hydroxide (MILK OF MAGNESIA) suspension 30 mL  30 mL Oral Daily PRN Motley-Mangrum, Jadeka A, PMHNP       paliperidone  (INVEGA ) 24 hr tablet 6 mg  6 mg Oral Daily Paxson Harrower E, PA-C   6 mg at 05/11/24 0850   traZODone  (DESYREL ) tablet 50 mg  50 mg Oral QHS PRN Motley-Mangrum, Jadeka A, PMHNP   50 mg at 05/08/24 2300    Lab Results:  No results found for this or any previous visit (from the past 48 hours).   Blood Alcohol level:  Lab Results  Component Value Date   [facility] <15 04/30/2024   ETH <15 04/09/2024    Metabolic Disorder Labs: Lab Results  Component Value Date   HGBA1C 5.1 10/31/2023   MPG 99.67 10/31/2023   No results found for: PROLACTIN Lab Results  Component Value Date   CHOL 136 10/31/2023   TRIG 75 10/31/2023   HDL 37 (L) 10/31/2023   CHOLHDL 3.7 10/31/2023   VLDL 15 10/31/2023   LDLCALC 84 10/31/2023    Physical Findings: AIMS:  , ,  ,  ,    CIWA:    COWS:      Musculoskeletal: Strength & Muscle Tone: within normal limits Gait & Station: normal Assets  Assets: Communication Skills    Physical Exam: Physical Exam ROS Blood pressure 115/83, pulse (!) 144, temperature 97.9 F (36.6 C), resp. rate 18, height 5' 4 (1.626 m), weight 109.3 kg, SpO2 (!) 84%. Body mass index is 41.37 kg/m.  Diagnosis: Principal Problem:   Bipolar 1 disorder (HCC)   PLAN: Safety and Monitoring:  -- Voluntary admission to inpatient psychiatric unit for safety, stabilization and treatment  -- Daily contact with patient to assess and evaluate symptoms and progress in treatment  -- Patient's case to be discussed in multi-disciplinary team meeting  -- Observation Level : q15 minute checks  -- Vital signs:   q12 hours  -- Precautions: suicide, elopement, and assault -- Encouraged patient to participate in unit milieu and in scheduled group therapies  2. Psychiatric Diagnoses and Treatment:  Bipolar disorder  D/C lithium  300 mg b.I.d. and Abilify  5 mg at bedtime.  Labs ordered for thyroid  panel and vitamin D .  Continue Invega  to 6 mg daily.              The risks/benefits/side-effects/alternatives to this medication were discussed in detail with the patient and time was given for questions. The patient consents to medication trial. -- Metabolic profile and EKG monitoring obtained while on an atypical antipsychotic  -- Encouraged patient to participate in unit milieu and in scheduled group therapies                    3. Medical Issues Being Addressed:  No medical issues identified at this time.   4. Discharge Planning:   -- Social work and case management to assist with discharge planning and identification of hospital follow-up needs prior to discharge  -- Estimated LOS: 1-2 weeks  Donnice FORBES Right, PA-C 05/11/2024, 1:43 PM

## 2024-05-12 DIAGNOSIS — F319 Bipolar disorder, unspecified: Secondary | ICD-10-CM | POA: Diagnosis not present

## 2024-05-12 MED ORDER — PALIPERIDONE PALMITATE ER 234 MG/1.5ML IM SUSY
234.0000 mg | PREFILLED_SYRINGE | Freq: Once | INTRAMUSCULAR | Status: DC
  Filled 2024-05-12: qty 1.5

## 2024-05-12 MED ORDER — PALIPERIDONE PALMITATE ER 156 MG/ML IM SUSY: 156.0000 mg | PREFILLED_SYRINGE | INTRAMUSCULAR | Status: DC

## 2024-05-12 NOTE — Group Note (Signed)
 Date:  05/12/2024 Time:  10:53 PM  Group Topic/Focus:  Making Healthy Choices:   The focus of this group is to help patients identify negative/unhealthy choices they were using prior to admission and identify positive/healthier coping strategies to replace them upon discharge.    Participation Level:  Active  Participation Quality:  Appropriate  Affect:  Appropriate  Cognitive:  Appropriate  Insight: Appropriate  Engagement in Group:  Engaged  Modes of Intervention:  Discussion and Education  Additional Comments:    Iran Rowe L 05/12/2024, 10:53 PM

## 2024-05-12 NOTE — Progress Notes (Signed)
   05/12/24 1028  Psych Admission Type (Psych Patients Only)  Admission Status Voluntary  Psychosocial Assessment  Patient Complaints None  Eye Contact Fair  Facial Expression Animated  Affect Appropriate to circumstance  Speech Logical/coherent  Interaction Assertive  Motor Activity Other (Comment) (WDL)  Appearance/Hygiene Unremarkable  Behavior Characteristics Cooperative;Appropriate to situation  Mood Pleasant  Thought Process  Coherency WDL  Content WDL  Delusions None reported or observed  Perception WDL  Hallucination None reported or observed  Judgment Poor  Confusion None  Danger to Self  Current suicidal ideation? Denies  Agreement Not to Harm Self Yes  Description of Agreement Verbal  Danger to Others  Danger to Others None reported or observed

## 2024-05-12 NOTE — Group Note (Signed)
 Date:  05/12/2024 Time:  10:46 PM  Group Topic/Focus:  Emotional Education:   The focus of this group is to discuss what feelings/emotions are, and how they are experienced.    Participation Level:  Active  Participation Quality:  Appropriate  Affect:  Appropriate  Cognitive:  Appropriate  Insight: Appropriate  Engagement in Group:  Engaged  Modes of Intervention:  Discussion  Additional Comments:    Chyenne Sobczak L 05/12/2024, 10:46 PM

## 2024-05-12 NOTE — Progress Notes (Addendum)
 [facility] MD Progress Note  05/08/2024 10:35 AM Lauren Poole  MRN:  [id number]  33 year old female who presented to the ED via EMS following a suicide attempt by overdose on 04/30/24. She reported ingesting approximately 30 Aleve pills. After ingestion, the patient did not experience vomiting or other acute physical symptoms. She reports that initially she wanted to not wake up, but later became fearful and contacted a friend, who encouraged her to seek care in the ED. The patient reports she has struggled with chronic suicidal ideation for about 1 year.  She states things have been hard but declines to elaborate on specific triggers.   Subjective:  Chart reviewed, case discussed in multidisciplinary meeting, patient seen during rounds.   05/12/24: Per follow-up they are alert and oriented. They are feeling more like themself. Still some bizarre behavior discusses that they want to fast for 7 days and are unable to explain why. Then begins discussing their current hunger. They Deny SI/HI/AVH. She does laugh inappropriately at times. Notes no dizziness this morning. Risk, benefit, and adverse effects of invega  sustenna are reviewed and pt consents to LAI. They are performing ADLS. They voice no concerns or complaints. No behavioral PRNS overnight. Act referral is being placed.   05/11/24: Per follow-up this morning they are alert and oriented.  They are pleasant and cooperative.  They continue to feel the Invega  is effective and they feel her thought processes been more linear and their mood has been more stable.  They continue to note some passive SI they deny plan or intent.  They had some dizziness this morning they are unclear on the timeframe with reference to getting the higher dose of Invega  today we will continue to monitor.  She does not appear to be internally preoccupied nor she observed to be responding to internal stimuli.  She denies HI.  Given multiple recent hospitalizations we will  continue to monitor work towards setting patient up with ACT team and progress towards LAI in the coming days.  05/10/24: Patient seen for follow up. They are alert and oriented. They are pleasant and cooperative on exam. They feel that the invega  is working well and are agreeable to continued titration. They noted some passive si today without plan or intent. Disussed medication options for depression and patient indicates she is only willing to take the Invega  at this time but that she is open to an LAI. She denies HI and AVH. Discussed ACTT services given the multiple recent hospitalizations. Sleep and appetite are stable. Had an argument with another pt during group yesterday but feels that it was because the pt was judging them. No behavioral PRNS overnight.   05/09/24: Patient seen for follow up. They are alert and oriented. They report they are not agreeable to continuing abilify . They are not agreeable to ongoing lab test required for Lithium , will discontinue both. Discussed invega  and LAI, patient is agreeable to trial of invega  with openess to transitioning to LAI. She denies SI/HI/AVH. Notes improving sleep and stable appetite.   05/08/2024: On interview today patient is noted to be sitting up in bed.  She is more engaged in interview today.  She reports she has not started Abilify  yet but is planning to take first dose this evening.  She has started lithium  and is tolerating this well.  She endorses symptoms of depression but denies persistent anxiety.  She denies current SI/HI/plan and denies hallucinations.  She notes most recent episode of passive SI was yesterday.  She  does not voice any concerns or complaints today.  She discusses desire to do something productive with her life.  05/07/2024: Per nursing report patient did not come out and remained mostly to her room.  This Clinical research associate talked with the patient and her room.  Patient reports that she got Seroquel  yesterday and still thinks sleepy  because of that.  She reports that she got lithium  today in the morning for the first time and would get Abilify  tonight.  Patient continues to report having suicidal ideations.  Reports that the last time she had suicidal ideations was yesterday in the evening time and she just woke up in the morning.  Still feeling depressed and rates her depression as 5 out of 10.  Patient reports that her normal self is when she rates her depression as 2 out of 10.  Continues to report that she has thoughts of shooting herself if she would have a gun.  But she does not have any access to guns.    05/06/2024:   per staff report patient has refused to take the Seroquel  at night yesterday.  Patient is still coming out and staying in her home.  Patient on interview reports that she came suicide attempt and reports that she had 1 previous suicide attempt this year.  Patient continues to report that she is depressed.  Patient reports that it is end of road for her.  Patient reports that she does not have access to gun but if she has done she would just get it over with.  Patient remained vague but reports that she has ongoing financial, family, life stressors.  Patient reports history consistent with bipolar 1   With manic episode lasting for 1 month but reports that these days she is not  having manic symptoms and mostly getting depressed.   9/19: On interview today patient is noted to be seated up in bed looking towards the window.  She is more engaged in interview today than she has been in recent days.   She was compliant with Seroquel  dose last night, and states she feels that it is working.  She reports tolerating medication well without adverse effects.  She is sleeping well and reports appetite as normal.  She denies SI/HI/plan and denies hallucinations.  She rates depression as a 4 out of 10 and anxiety as a 0 out of 10 today.  She does not voice any concerns or complaints today.  9/18: On interview today patient is  noted to be alert and laying in bed.  She declines to communicate with provider verbally.  Patient responds to questions during interview with a head nod or head shake.  Patient declined medication last night per nursing report.  She denies SI/HI/plan and denies hallucinations via shaking her head to indicate 'no' when asked.  She declined to answer questions regarding sleep and appetite.  When asked if tolerating medication patient nods head to indicate 'yes'.  Patient declined to participate further in interview.  9/17: On interview today patient is noted to be laying in bed with hand covering her face.  She continues to be withdrawn and minimizes symptoms.  She denies current SI/HI/plan and denies hallucinations.  She states she is tolerating initiation of Seroquel  well.  Patient declines to answer further questions.  9/16: On interview, patient is noted to be laying in bed.  Per nursing report, she has primarily been isolated to her room since her arrival on the unit.  She endorses passive SI without  current intent or plan.  She denies HI/plan and denies hallucinations.  She reports she is sleeping and eating well.  Interviewer discussed treatment options with patient to include medication.  Patient states she prefers to be restarted on Seroquel , which she has taken in the past with good response.  Patient does not voice any other complaints or concerns today.   Sleep: Patient declined to answer  Appetite: Patient declined to answer  Past Psychiatric History: see h&P Family History:  Family History  Problem Relation Age of Onset   Schizophrenia Sister    Social History:  Social History   Substance and Sexual Activity  Alcohol Use Yes   Alcohol/week: 4.0 standard drinks of alcohol   Types: 4 Glasses of wine per week     Social History   Substance and Sexual Activity  Drug Use Not Currently   Types: Marijuana    Social History   Socioeconomic History   Marital status: Single     Spouse name: Not on file   Number of children: Not on file   Years of education: Not on file   Highest education level: Not on file  Occupational History   Not on file  Tobacco Use   Smoking status: Every Day    Current packs/day: 0.50    Average packs/day: 0.5 packs/day for 6.6 years (3.3 ttl pk-yrs)    Types: Cigarettes    Start date: 10/2017   Smokeless tobacco: Never  Vaping Use   Vaping status: Never Used  Substance and Sexual Activity   Alcohol use: Yes    Alcohol/week: 4.0 standard drinks of alcohol    Types: 4 Glasses of wine per week   Drug use: Not Currently    Types: Marijuana   Sexual activity: Yes    Birth control/protection: None  Other Topics Concern   Not on file  Social History Narrative   Not on file   Social Drivers of Health   Financial Resource Strain: Not on file  Food Insecurity: Food Insecurity Present (04/30/2024)   Hunger Vital Sign    Worried About Running Out of Food in the Last Year: Sometimes true    Ran Out of Food in the Last Year: Sometimes true  Transportation Needs: Unmet Transportation Needs (04/30/2024)   PRAPARE - Administrator, Civil Service (Medical): Yes    Lack of Transportation (Non-Medical): Yes  Physical Activity: Not on file  Stress: Not on file  Social Connections: Not on file   Past Medical History:  Past Medical History:  Diagnosis Date   Bipolar 1 disorder (HCC)    Chlamydia    Gonorrhea     Past Surgical History:  Procedure Laterality Date   NO PAST SURGERIES      Current Medications: Current Facility-Administered Medications  Medication Dose Route Frequency Provider Last Rate Last Admin   acetaminophen  (TYLENOL ) tablet 650 mg  650 mg Oral Q6H PRN Motley-Mangrum, Jadeka A, PMHNP       alum & mag hydroxide-simeth (MAALOX/MYLANTA) 200-200-20 MG/5ML suspension 30 mL  30 mL Oral Q4H PRN Motley-Mangrum, Jadeka A, PMHNP       haloperidol  (HALDOL ) tablet 5 mg  5 mg Oral TID PRN Motley-Mangrum, Jadeka A,  PMHNP       And   diphenhydrAMINE  (BENADRYL ) capsule 50 mg  50 mg Oral TID PRN Motley-Mangrum, Jadeka A, PMHNP       haloperidol  lactate (HALDOL ) injection 5 mg  5 mg Intramuscular TID PRN Motley-Mangrum, Jadeka A, PMHNP  And   diphenhydrAMINE  (BENADRYL ) injection 50 mg  50 mg Intramuscular TID PRN Motley-Mangrum, Jadeka A, PMHNP       And   LORazepam  (ATIVAN ) injection 2 mg  2 mg Intramuscular TID PRN Motley-Mangrum, Jadeka A, PMHNP       haloperidol  lactate (HALDOL ) injection 10 mg  10 mg Intramuscular TID PRN Motley-Mangrum, Jadeka A, PMHNP       And   diphenhydrAMINE  (BENADRYL ) injection 50 mg  50 mg Intramuscular TID PRN Motley-Mangrum, Jadeka A, PMHNP       And   LORazepam  (ATIVAN ) injection 2 mg  2 mg Intramuscular TID PRN Motley-Mangrum, Jadeka A, PMHNP       magnesium  hydroxide (MILK OF MAGNESIA) suspension 30 mL  30 mL Oral Daily PRN Motley-Mangrum, Jadeka A, PMHNP       paliperidone  (INVEGA ) 24 hr tablet 6 mg  6 mg Oral Daily Alessia Gonsalez E, PA-C   6 mg at 05/12/24 0745   traZODone  (DESYREL ) tablet 50 mg  50 mg Oral QHS PRN Motley-Mangrum, Jadeka A, PMHNP   50 mg at 05/08/24 2300    Lab Results:  No results found for this or any previous visit (from the past 48 hours).   Blood Alcohol level:  Lab Results  Component Value Date   [facility] <15 04/30/2024   ETH <15 04/09/2024    Metabolic Disorder Labs: Lab Results  Component Value Date   HGBA1C 5.1 10/31/2023   MPG 99.67 10/31/2023   No results found for: PROLACTIN Lab Results  Component Value Date   CHOL 136 10/31/2023   TRIG 75 10/31/2023   HDL 37 (L) 10/31/2023   CHOLHDL 3.7 10/31/2023   VLDL 15 10/31/2023   LDLCALC 84 10/31/2023    Physical Findings: AIMS:  , ,  ,  ,    CIWA:    COWS:      Musculoskeletal: Strength & Muscle Tone: within normal limits Gait & Station: normal Assets  Assets: Communication Skills    Physical Exam: Physical Exam Vitals and nursing note reviewed.  HENT:      Head: Atraumatic.  Eyes:     Extraocular Movements: Extraocular movements intact.  Pulmonary:     Effort: Pulmonary effort is normal.  Neurological:     Mental Status: She is alert and oriented to person, place, and time.    ROS Blood pressure (!) 113/58, pulse 94, temperature (!) 97.4 F (36.3 C), resp. rate 20, height 5' 4 (1.626 m), weight 109.3 kg, SpO2 99%. Body mass index is 41.37 kg/m.  Diagnosis: Principal Problem:   Bipolar 1 disorder (HCC)   PLAN: Safety and Monitoring:  -- Voluntary admission to inpatient psychiatric unit for safety, stabilization and treatment  -- Daily contact with patient to assess and evaluate symptoms and progress in treatment  -- Patient's case to be discussed in multi-disciplinary team meeting  -- Observation Level : q15 minute checks  -- Vital signs:  q12 hours  -- Precautions: suicide, elopement, and assault -- Encouraged patient to participate in unit milieu and in scheduled group therapies  2. Psychiatric Diagnoses and Treatment:  Reviewed case with attending Dr. Donnelly who was agreeable with proceeding with LAI. Reviewed dosing with pharmacy who agreed.  Bipolar disorder  D/C lithium  300 mg b.I.d. and Abilify  5 mg at bedtime.  Labs ordered for thyroid  panel and vitamin D .  D/c Invega  to 6 mg daily oral overlap not required.  First dose of Invega  Sustenna 234 mg scheduled for 05/13/24 'Second dose  of invega  Sustenna 156 mg Scheduled for 05/17/24 Q monthly             The risks/benefits/side-effects/alternatives to this medication were discussed in detail with the patient and time was given for questions. The patient consents to medication trial. -- Metabolic profile and EKG monitoring obtained while on an atypical antipsychotic  -- Encouraged patient to participate in unit milieu and in scheduled group therapies                    3. Medical Issues Being Addressed:  No medical issues identified at this time.   4. Discharge  Planning:   -- Social work and case management to assist with discharge planning and identification of hospital follow-up needs prior to discharge  -- Estimated LOS: 1-2 weeks  Donnice FORBES Right, PA-C 05/12/2024, 10:35 AM

## 2024-05-12 NOTE — BH IP Treatment Plan (Signed)
 Interdisciplinary Treatment and Diagnostic Plan Update  05/12/2024 Time of Session: 14:00 Lauren Poole MRN: [id number]  Principal Diagnosis: Bipolar 1 disorder (HCC)  Secondary Diagnoses: Principal Problem:   Bipolar 1 disorder (HCC)   Current Medications:  Current Facility-Administered Medications  Medication Dose Route Frequency Provider Last Rate Last Admin   acetaminophen  (TYLENOL ) tablet 650 mg  650 mg Oral Q6H PRN Motley-Mangrum, Jadeka A, PMHNP       alum & mag hydroxide-simeth (MAALOX/MYLANTA) 200-200-20 MG/5ML suspension 30 mL  30 mL Oral Q4H PRN Motley-Mangrum, Jadeka A, PMHNP       haloperidol  (HALDOL ) tablet 5 mg  5 mg Oral TID PRN Motley-Mangrum, Jadeka A, PMHNP       And   diphenhydrAMINE  (BENADRYL ) capsule 50 mg  50 mg Oral TID PRN Motley-Mangrum, Jadeka A, PMHNP       haloperidol  lactate (HALDOL ) injection 5 mg  5 mg Intramuscular TID PRN Motley-Mangrum, Jadeka A, PMHNP       And   diphenhydrAMINE  (BENADRYL ) injection 50 mg  50 mg Intramuscular TID PRN Motley-Mangrum, Jadeka A, PMHNP       And   LORazepam  (ATIVAN ) injection 2 mg  2 mg Intramuscular TID PRN Motley-Mangrum, Jadeka A, PMHNP       haloperidol  lactate (HALDOL ) injection 10 mg  10 mg Intramuscular TID PRN Motley-Mangrum, Jadeka A, PMHNP       And   diphenhydrAMINE  (BENADRYL ) injection 50 mg  50 mg Intramuscular TID PRN Motley-Mangrum, Jadeka A, PMHNP       And   LORazepam  (ATIVAN ) injection 2 mg  2 mg Intramuscular TID PRN Motley-Mangrum, Jadeka A, PMHNP       magnesium  hydroxide (MILK OF MAGNESIA) suspension 30 mL  30 mL Oral Daily PRN Motley-Mangrum, Jadeka A, PMHNP       [START ON 05/17/2024] paliperidone  (INVEGA  SUSTENNA) injection 156 mg  156 mg Intramuscular Q30 days Millington, Matthew E, PA-C       [START ON 05/13/2024] paliperidone  (INVEGA  SUSTENNA) injection 234 mg  234 mg Intramuscular Once Millington, Matthew E, PA-C       traZODone  (DESYREL ) tablet 50 mg  50 mg Oral QHS PRN Motley-Mangrum,  Jadeka A, PMHNP   50 mg at 05/08/24 2300   PTA Medications: Medications Prior to Admission  Medication Sig Dispense Refill Last Dose/Taking   cholecalciferol  (CHOLECALCIFEROL ) 25 MCG tablet Take 1 tablet (1,000 Units total) by mouth daily. (Patient taking differently: Take 1,000 Units by mouth every 3 (three) days.) 30 tablet 0    naproxen sodium (ALEVE) 220 MG tablet Take 440 mg by mouth daily as needed (pain).      traZODone  (DESYREL ) 100 MG tablet Take 1 tablet (100 mg total) by mouth at bedtime. (Patient not taking: Reported on 04/30/2024) 30 tablet 0     Patient Stressors: Financial difficulties    Patient Strengths: Manufacturing systems engineer   Treatment Modalities: Medication Management, Group therapy, Case management,  1 to 1 session with clinician, Psychoeducation, Recreational therapy.   Physician Treatment Plan for Primary Diagnosis: Bipolar 1 disorder (HCC) Long Term Goal(s): Improvement in symptoms so as ready for discharge   Short Term Goals: Ability to identify changes in lifestyle to reduce recurrence of condition will improve Ability to verbalize feelings will improve Ability to disclose and discuss suicidal ideas Ability to identify and develop effective coping behaviors will improve  Medication Management: Evaluate patient's response, side effects, and tolerance of medication regimen.  Therapeutic Interventions: 1 to 1 sessions, Unit Group sessions and Medication administration.  Evaluation of Outcomes: Progressing  Physician Treatment Plan for Secondary Diagnosis: Principal Problem:   Bipolar 1 disorder (HCC)  Long Term Goal(s): Improvement in symptoms so as ready for discharge   Short Term Goals: Ability to identify changes in lifestyle to reduce recurrence of condition will improve Ability to verbalize feelings will improve Ability to disclose and discuss suicidal ideas Ability to identify and develop effective coping behaviors will improve     Medication  Management: Evaluate patient's response, side effects, and tolerance of medication regimen.  Therapeutic Interventions: 1 to 1 sessions, Unit Group sessions and Medication administration.  Evaluation of Outcomes: Progressing   RN Treatment Plan for Primary Diagnosis: Bipolar 1 disorder (HCC) Long Term Goal(s): Knowledge of disease and therapeutic regimen to maintain health will improve  Short Term Goals: Ability to remain free from injury will improve, Ability to verbalize frustration and anger appropriately will improve, Ability to demonstrate self-control, Ability to participate in decision making will improve, Ability to verbalize feelings will improve, Ability to disclose and discuss suicidal ideas, Ability to identify and develop effective coping behaviors will improve, and Compliance with prescribed medications will improve  Medication Management: RN will administer medications as ordered by provider, will assess and evaluate patient's response and provide education to patient for prescribed medication. RN will report any adverse and/or side effects to prescribing provider.  Therapeutic Interventions: 1 on 1 counseling sessions, Psychoeducation, Medication administration, Evaluate responses to treatment, Monitor vital signs and CBGs as ordered, Perform/monitor CIWA, COWS, AIMS and Fall Risk screenings as ordered, Perform wound care treatments as ordered.  Evaluation of Outcomes: Progressing   LCSW Treatment Plan for Primary Diagnosis: Bipolar 1 disorder (HCC) Long Term Goal(s): Safe transition to appropriate next level of care at discharge, Engage patient in therapeutic group addressing interpersonal concerns.  Short Term Goals: Engage patient in aftercare planning with referrals and resources, Increase social support, Increase ability to appropriately verbalize feelings, Increase emotional regulation, Facilitate acceptance of mental health diagnosis and concerns, Facilitate patient  progression through stages of change regarding substance use diagnoses and concerns, Identify triggers associated with mental health/substance abuse issues, and Increase skills for wellness and recovery  Therapeutic Interventions: Assess for all discharge needs, 1 to 1 time with Social worker, Explore available resources and support systems, Assess for adequacy in community support network, Educate family and significant other(s) on suicide prevention, Complete Psychosocial Assessment, Interpersonal group therapy.  Evaluation of Outcomes: Progressing   Progress in Treatment: Attending groups: Yes. Participating in groups: Yes. Taking medication as prescribed: Yes. Toleration medication: Yes. Family/Significant other contact made: No, will contact:  if given permission.  Patient understands diagnosis: Yes. Discussing patient identified problems/goals with staff: Yes. Medical problems stabilized or resolved: Yes. Denies suicidal/homicidal ideation: Yes. Issues/concerns per patient self-inventory: No. Other: none.   New problem(s) identified: No, Describe:  none identified. Update 05/07/24: No changes at this time. Update 05/12/24: No changes at this time.    New Short Term/Long Term Goal(s): elimination of symptoms of psychosis, medication management for mood stabilization; elimination of SI thoughts; development of comprehensive mental wellness/sobriety plan. Update 05/07/24: No changes at this time. Update 05/12/24: No changes at this time.   Patient Goals: To go home.   Update 05/07/24: No changes at this time. Update 05/12/24: No changes at this time.   Discharge Plan or Barriers: CSW will assist pt with development of an appropriate aftercare/discharge plan.   Update 05/07/24: No changes at this time. Update 05/12/24: No changes at this time.  Reason for Continuation of Hospitalization: Aggression Medication stabilization   Estimated Length of Stay: 1-7 days  Update 05/07/24: No  changes at this time. Update 05/12/24: TBD.   Last 3 [place] Suicide Severity Risk Score: Flowsheet Row Admission (Current) from 04/30/2024 in [facility] INPATIENT BEHAVIORAL MEDICINE Most recent reading at 04/30/2024  6:00 PM ED from 04/30/2024 in [facility] Emergency Department [facility] Most recent reading at 04/30/2024  1:04 AM Admission (Discharged) from 04/09/2024 in [facility] INPATIENT BEHAVIORAL MEDICINE Most recent reading at 04/09/2024  6:00 PM  C-SSRS RISK CATEGORY High Risk High Risk Low Risk    Last PHQ 2/9 Scores:     No data to display          Scribe for Treatment Team: Nadara JONELLE Fam, LCSW 05/12/2024 4:08 PM

## 2024-05-12 NOTE — Plan of Care (Signed)
   Problem: Education: Goal: Emotional status will improve Outcome: Progressing Goal: Mental status will improve Outcome: Progressing Goal: Verbalization of understanding the information provided will improve Outcome: Progressing

## 2024-05-12 NOTE — Group Note (Signed)
 Recreation Therapy Group Note   Group Topic:Leisure Education  Group Date: 05/12/2024 Start Time: 1300 End Time: 1400 Facilitators: Celestia Jeoffrey BRAVO, LRT, CTRS Location: Craft Room  Group Description: Leisure. Patients were given the option to choose from journaling, coloring, drawing, making origami, playing with playdoh, listening to music or singing karaoke. LRT and pts discussed the meaning of leisure, the importance of participating in leisure during their free time/when they're outside of the hospital, as well as how our leisure interests can also serve as coping skills.   Goal Area(s) Addressed:  Patient will identify a current leisure interest.  Patient will learn the definition of "leisure". Patient will practice making a positive decision. Patient will have the opportunity to try a new leisure activity. Patient will communicate with peers and LRT.    Affect/Mood: N/A   Participation Level: Did not attend    Clinical Observations/Individualized Feedback: Patient did not attend group.   Plan: Continue to engage patient in RT group sessions 2-3x/week.   Jeoffrey BRAVO Celestia, LRT, CTRS 05/12/2024 3:16 PM

## 2024-05-12 NOTE — Group Note (Signed)
 Recreation Therapy Group Note   Group Topic:Health and Wellness  Group Date: 05/12/2024 Start Time: 1020 End Time: 1115 Facilitators: Celestia Jeoffrey BRAVO, LRT, CTRS Location: Courtyard  Group Description: [employer]. LRT and patients played games of basketball, drew with chalk, and played corn hole while outside in the courtyard while getting fresh air and sunlight. Music was being played in the background. LRT and peers conversed about different games they have played before, what they do in their free time and anything else that is on their minds. LRT encouraged pts to drink water after being outside, sweating and getting their heart rate up.  Goal Area(s) Addressed: Patient will build on frustration tolerance skills. Patients will partake in a competitive play game with peers. Patients will gain knowledge of new leisure interest/hobby.     Affect/Mood: Appropriate   Participation Level: Minimal    Clinical Observations/Individualized Feedback: Lauren Poole was present in group for 10 minutes.   Plan: Continue to engage patient in RT group sessions 2-3x/week.   Jeoffrey BRAVO Celestia, LRT, CTRS 05/12/2024 11:43 AM

## 2024-05-12 NOTE — Group Note (Signed)
 Date:  05/12/2024 Time:  6:03 PM  Group Topic/Focus:  Wellness Toolbox:   The focus of this group is to discuss various aspects of wellness, balancing those aspects and exploring ways to increase the ability to experience wellness.  Patients will create a wellness toolbox for use upon discharge.    Participation Level:  Did Not Attend   Deitra Clap [facility] 05/12/2024, 6:03 PM

## 2024-05-12 NOTE — Group Note (Deleted)
 Date:  05/12/2024 Time:  10:40 PM  Group Topic/Focus:  Emotional Education:   The focus of this group is to discuss what feelings/emotions are, and how they are experienced.     Participation Level:  {BHH PARTICIPATION OZCZO:77735}  Participation Quality:  {BHH PARTICIPATION QUALITY:22265}  Affect:  {BHH AFFECT:22266}  Cognitive:  {BHH COGNITIVE:22267}  Insight: {BHH Insight2:20797}  Engagement in Group:  {BHH ENGAGEMENT IN HMNLE:77731}  Modes of Intervention:  {BHH MODES OF INTERVENTION:22269}  Additional Comments:  ***  Lauren Poole L 05/12/2024, 10:40 PM

## 2024-05-13 DIAGNOSIS — F319 Bipolar disorder, unspecified: Secondary | ICD-10-CM | POA: Diagnosis not present

## 2024-05-13 MED ORDER — PALIPERIDONE ER 3 MG PO TB24
3.0000 mg | ORAL_TABLET | Freq: Every day | ORAL | Status: DC
  Administered 2024-05-13 – 2024-05-15 (×3): 3 mg via ORAL
  Filled 2024-05-13 (×4): qty 1

## 2024-05-13 MED ORDER — INFLUENZA VIRUS VACC SPLIT PF (FLUZONE) 0.5 ML IM SUSY: 0.5000 mL | PREFILLED_SYRINGE | INTRAMUSCULAR | Status: DC

## 2024-05-13 NOTE — Progress Notes (Signed)
 [facility] MD Progress Note  05/08/2024 3:13 PM Alyze Lauf  MRN:  [id number]  33 year old female who presented to the ED via EMS following a suicide attempt by overdose on 04/30/24. She reported ingesting approximately 30 Aleve pills. After ingestion, the patient did not experience vomiting or other acute physical symptoms. She reports that initially she wanted to not wake up, but later became fearful and contacted a friend, who encouraged her to seek care in the ED. The patient reports she has struggled with chronic suicidal ideation for about 1 year.  She states things have been hard but declines to elaborate on specific triggers.   Subjective:  Chart reviewed, case discussed in multidisciplinary meeting, patient seen during rounds.   05/13/24: Per nursing patient declined LAI this morning.  On interview she indicates she does not take any longer.  She denies SI, HI, and AVH.  She reports she is agreeable to taking the oral 3 mg dose.  She demonstrates poor insight.  Discussed her past psych records of poor medication compliance and how long-acting injectable would remove the need for her to take medication every day.  She continues to decline despite previously agreeing.  She did not require behavioral PRNs overnight they are performing ADLs.  They voiced no concerns or complaints.  Continue with some paranoia and bizarre behavior we will continue to monitor.  05/12/24: Per follow-up they are alert and oriented. They are feeling more like themself. Still some bizarre behavior discusses that they want to fast for 7 days and are unable to explain why. Then begins discussing their current hunger. They Deny SI/HI/AVH. She does laugh inappropriately at times. Notes no dizziness this morning. Risk, benefit, and adverse effects of invega  sustenna are reviewed and pt consents to LAI. They are performing ADLS. They voice no concerns or complaints. No behavioral PRNS overnight. Act referral is being placed.    05/11/24: Per follow-up this morning they are alert and oriented.  They are pleasant and cooperative.  They continue to feel the Invega  is effective and they feel her thought processes been more linear and their mood has been more stable.  They continue to note some passive SI they deny plan or intent.  They had some dizziness this morning they are unclear on the timeframe with reference to getting the higher dose of Invega  today we will continue to monitor.  She does not appear to be internally preoccupied nor she observed to be responding to internal stimuli.  She denies HI.  Given multiple recent hospitalizations we will continue to monitor work towards setting patient up with ACT team and progress towards LAI in the coming days.  05/10/24: Patient seen for follow up. They are alert and oriented. They are pleasant and cooperative on exam. They feel that the invega  is working well and are agreeable to continued titration. They noted some passive si today without plan or intent. Disussed medication options for depression and patient indicates she is only willing to take the Invega  at this time but that she is open to an LAI. She denies HI and AVH. Discussed ACTT services given the multiple recent hospitalizations. Sleep and appetite are stable. Had an argument with another pt during group yesterday but feels that it was because the pt was judging them. No behavioral PRNS overnight.   05/09/24: Patient seen for follow up. They are alert and oriented. They report they are not agreeable to continuing abilify . They are not agreeable to ongoing lab test required for Lithium , will  discontinue both. Discussed invega  and LAI, patient is agreeable to trial of invega  with openess to transitioning to LAI. She denies SI/HI/AVH. Notes improving sleep and stable appetite.   05/08/2024: On interview today patient is noted to be sitting up in bed.  She is more engaged in interview today.  She reports she has not started  Abilify  yet but is planning to take first dose this evening.  She has started lithium  and is tolerating this well.  She endorses symptoms of depression but denies persistent anxiety.  She denies current SI/HI/plan and denies hallucinations.  She notes most recent episode of passive SI was yesterday.  She does not voice any concerns or complaints today.  She discusses desire to do something productive with her life.  05/07/2024: Per nursing report patient did not come out and remained mostly to her room.  This Clinical research associate talked with the patient and her room.  Patient reports that she got Seroquel  yesterday and still thinks sleepy because of that.  She reports that she got lithium  today in the morning for the first time and would get Abilify  tonight.  Patient continues to report having suicidal ideations.  Reports that the last time she had suicidal ideations was yesterday in the evening time and she just woke up in the morning.  Still feeling depressed and rates her depression as 5 out of 10.  Patient reports that her normal self is when she rates her depression as 2 out of 10.  Continues to report that she has thoughts of shooting herself if she would have a gun.  But she does not have any access to guns.    05/06/2024:   per staff report patient has refused to take the Seroquel  at night yesterday.  Patient is still coming out and staying in her home.  Patient on interview reports that she came suicide attempt and reports that she had 1 previous suicide attempt this year.  Patient continues to report that she is depressed.  Patient reports that it is end of road for her.  Patient reports that she does not have access to gun but if she has done she would just get it over with.  Patient remained vague but reports that she has ongoing financial, family, life stressors.  Patient reports history consistent with bipolar 1   With manic episode lasting for 1 month but reports that these days she is not  having manic  symptoms and mostly getting depressed.   9/19: On interview today patient is noted to be seated up in bed looking towards the window.  She is more engaged in interview today than she has been in recent days.   She was compliant with Seroquel  dose last night, and states she feels that it is working.  She reports tolerating medication well without adverse effects.  She is sleeping well and reports appetite as normal.  She denies SI/HI/plan and denies hallucinations.  She rates depression as a 4 out of 10 and anxiety as a 0 out of 10 today.  She does not voice any concerns or complaints today.  9/18: On interview today patient is noted to be alert and laying in bed.  She declines to communicate with provider verbally.  Patient responds to questions during interview with a head nod or head shake.  Patient declined medication last night per nursing report.  She denies SI/HI/plan and denies hallucinations via shaking her head to indicate 'no' when asked.  She declined to answer questions regarding sleep  and appetite.  When asked if tolerating medication patient nods head to indicate 'yes'.  Patient declined to participate further in interview.  9/17: On interview today patient is noted to be laying in bed with hand covering her face.  She continues to be withdrawn and minimizes symptoms.  She denies current SI/HI/plan and denies hallucinations.  She states she is tolerating initiation of Seroquel  well.  Patient declines to answer further questions.  9/16: On interview, patient is noted to be laying in bed.  Per nursing report, she has primarily been isolated to her room since her arrival on the unit.  She endorses passive SI without current intent or plan.  She denies HI/plan and denies hallucinations.  She reports she is sleeping and eating well.  Interviewer discussed treatment options with patient to include medication.  Patient states she prefers to be restarted on Seroquel , which she has taken in the past  with good response.  Patient does not voice any other complaints or concerns today.   Sleep: Patient declined to answer  Appetite: Patient declined to answer  Past Psychiatric History: see h&P Family History:  Family History  Problem Relation Age of Onset   Schizophrenia Sister    Social History:  Social History   Substance and Sexual Activity  Alcohol Use Yes   Alcohol/week: 4.0 standard drinks of alcohol   Types: 4 Glasses of wine per week     Social History   Substance and Sexual Activity  Drug Use Not Currently   Types: Marijuana    Social History   Socioeconomic History   Marital status: Single    Spouse name: Not on file   Number of children: Not on file   Years of education: Not on file   Highest education level: Not on file  Occupational History   Not on file  Tobacco Use   Smoking status: Every Day    Current packs/day: 0.50    Average packs/day: 0.5 packs/day for 6.6 years (3.3 ttl pk-yrs)    Types: Cigarettes    Start date: 10/2017   Smokeless tobacco: Never  Vaping Use   Vaping status: Never Used  Substance and Sexual Activity   Alcohol use: Yes    Alcohol/week: 4.0 standard drinks of alcohol    Types: 4 Glasses of wine per week   Drug use: Not Currently    Types: Marijuana   Sexual activity: Yes    Birth control/protection: None  Other Topics Concern   Not on file  Social History Narrative   Not on file   Social Drivers of Health   Financial Resource Strain: Not on file  Food Insecurity: Food Insecurity Present (04/30/2024)   Hunger Vital Sign    Worried About Running Out of Food in the Last Year: Sometimes true    Ran Out of Food in the Last Year: Sometimes true  Transportation Needs: Unmet Transportation Needs (04/30/2024)   PRAPARE - Administrator, Civil Service (Medical): Yes    Lack of Transportation (Non-Medical): Yes  Physical Activity: Not on file  Stress: Not on file  Social Connections: Not on file   Past  Medical History:  Past Medical History:  Diagnosis Date   Bipolar 1 disorder (HCC)    Chlamydia    Gonorrhea     Past Surgical History:  Procedure Laterality Date   NO PAST SURGERIES      Current Medications: Current Facility-Administered Medications  Medication Dose Route Frequency Provider Last Rate Last Admin  acetaminophen  (TYLENOL ) tablet 650 mg  650 mg Oral Q6H PRN Motley-Mangrum, Jadeka A, PMHNP       alum & mag hydroxide-simeth (MAALOX/MYLANTA) 200-200-20 MG/5ML suspension 30 mL  30 mL Oral Q4H PRN Motley-Mangrum, Jadeka A, PMHNP       haloperidol  (HALDOL ) tablet 5 mg  5 mg Oral TID PRN Motley-Mangrum, Jadeka A, PMHNP       And   diphenhydrAMINE  (BENADRYL ) capsule 50 mg  50 mg Oral TID PRN Motley-Mangrum, Jadeka A, PMHNP       haloperidol  lactate (HALDOL ) injection 5 mg  5 mg Intramuscular TID PRN Motley-Mangrum, Jadeka A, PMHNP       And   diphenhydrAMINE  (BENADRYL ) injection 50 mg  50 mg Intramuscular TID PRN Motley-Mangrum, Jadeka A, PMHNP       And   LORazepam  (ATIVAN ) injection 2 mg  2 mg Intramuscular TID PRN Motley-Mangrum, Jadeka A, PMHNP       haloperidol  lactate (HALDOL ) injection 10 mg  10 mg Intramuscular TID PRN Motley-Mangrum, Jadeka A, PMHNP       And   diphenhydrAMINE  (BENADRYL ) injection 50 mg  50 mg Intramuscular TID PRN Motley-Mangrum, Jadeka A, PMHNP       And   LORazepam  (ATIVAN ) injection 2 mg  2 mg Intramuscular TID PRN Motley-Mangrum, Jadeka A, PMHNP       magnesium  hydroxide (MILK OF MAGNESIA) suspension 30 mL  30 mL Oral Daily PRN Motley-Mangrum, Jadeka A, PMHNP       paliperidone  (INVEGA ) 24 hr tablet 3 mg  3 mg Oral Daily Cruz Bong E, PA-C   3 mg at 05/13/24 1330   traZODone  (DESYREL ) tablet 50 mg  50 mg Oral QHS PRN Motley-Mangrum, Jadeka A, PMHNP   50 mg at 05/12/24 2145    Lab Results:  No results found for this or any previous visit (from the past 48 hours).   Blood Alcohol level:  Lab Results  Component Value Date   [facility] <15  04/30/2024   ETH <15 04/09/2024    Metabolic Disorder Labs: Lab Results  Component Value Date   HGBA1C 5.1 10/31/2023   MPG 99.67 10/31/2023   No results found for: PROLACTIN Lab Results  Component Value Date   CHOL 136 10/31/2023   TRIG 75 10/31/2023   HDL 37 (L) 10/31/2023   CHOLHDL 3.7 10/31/2023   VLDL 15 10/31/2023   LDLCALC 84 10/31/2023    Physical Findings: AIMS:  , ,  ,  ,    CIWA:    COWS:      Musculoskeletal: Strength & Muscle Tone: within normal limits Gait & Station: normal Assets  Assets: Communication Skills    Physical Exam: Physical Exam Vitals and nursing note reviewed.  HENT:     Head: Atraumatic.  Eyes:     Extraocular Movements: Extraocular movements intact.  Pulmonary:     Effort: Pulmonary effort is normal.  Neurological:     Mental Status: She is alert and oriented to person, place, and time.    ROS Blood pressure 117/82, pulse 81, temperature (!) 97.5 F (36.4 C), resp. rate 20, height 5' 4 (1.626 m), weight 109.3 kg, SpO2 98%. Body mass index is 41.37 kg/m.  Diagnosis: Principal Problem:   Bipolar 1 disorder (HCC)   PLAN: Safety and Monitoring:  -- Voluntary admission to inpatient psychiatric unit for safety, stabilization and treatment  -- Daily contact with patient to assess and evaluate symptoms and progress in treatment  -- Patient's case to be discussed  in multi-disciplinary team meeting  -- Observation Level : q15 minute checks  -- Vital signs:  q12 hours  -- Precautions: suicide, elopement, and assault -- Encouraged patient to participate in unit milieu and in scheduled group therapies  2. Psychiatric Diagnoses and Treatment:  Reviewed case with attending Dr. Donnelly who was agreeable with proceeding with LAI. Reviewed dosing with pharmacy who agreed.  Bipolar disorder  D/C lithium  300 mg b.I.d. and Abilify  5 mg at bedtime.  Labs ordered for thyroid  panel and vitamin D .  Patient changed her mind and now is  not taking long-acting injectable they have since been discontinued we will restart Invega  3 mg daily as she does not agree to take the 6 mg dose             The risks/benefits/side-effects/alternatives to this medication were discussed in detail with the patient and time was given for questions. The patient consents to medication trial. -- Metabolic profile and EKG monitoring obtained while on an atypical antipsychotic  -- Encouraged patient to participate in unit milieu and in scheduled group therapies                    3. Medical Issues Being Addressed:  No medical issues identified at this time.   4. Discharge Planning:   -- Social work and case management to assist with discharge planning and identification of hospital follow-up needs prior to discharge  -- Estimated LOS: 1-2 weeks  Donnice FORBES Right, PA-C 05/13/2024, 3:13 PM

## 2024-05-13 NOTE — Plan of Care (Signed)
 Lauren Poole is a 33 y.o. female patient. No diagnosis found. Past Medical History:  Diagnosis Date   Bipolar 1 disorder (HCC)    Chlamydia    Gonorrhea    Current Facility-Administered Medications  Medication Dose Route Frequency Provider Last Rate Last Admin   acetaminophen  (TYLENOL ) tablet 650 mg  650 mg Oral Q6H PRN Motley-Mangrum, Jadeka A, PMHNP       alum & mag hydroxide-simeth (MAALOX/MYLANTA) 200-200-20 MG/5ML suspension 30 mL  30 mL Oral Q4H PRN Motley-Mangrum, Jadeka A, PMHNP       haloperidol  (HALDOL ) tablet 5 mg  5 mg Oral TID PRN Motley-Mangrum, Jadeka A, PMHNP       And   diphenhydrAMINE  (BENADRYL ) capsule 50 mg  50 mg Oral TID PRN Motley-Mangrum, Jadeka A, PMHNP       haloperidol  lactate (HALDOL ) injection 5 mg  5 mg Intramuscular TID PRN Motley-Mangrum, Jadeka A, PMHNP       And   diphenhydrAMINE  (BENADRYL ) injection 50 mg  50 mg Intramuscular TID PRN Motley-Mangrum, Jadeka A, PMHNP       And   LORazepam  (ATIVAN ) injection 2 mg  2 mg Intramuscular TID PRN Motley-Mangrum, Jadeka A, PMHNP       haloperidol  lactate (HALDOL ) injection 10 mg  10 mg Intramuscular TID PRN Motley-Mangrum, Jadeka A, PMHNP       And   diphenhydrAMINE  (BENADRYL ) injection 50 mg  50 mg Intramuscular TID PRN Motley-Mangrum, Jadeka A, PMHNP       And   LORazepam  (ATIVAN ) injection 2 mg  2 mg Intramuscular TID PRN Motley-Mangrum, Jadeka A, PMHNP       [START ON 05/14/2024] influenza vac split trivalent PF (FLUZONE) injection 0.5 mL  0.5 mL Intramuscular Tomorrow-1000 Donnelly Mellow, MD       magnesium  hydroxide (MILK OF MAGNESIA) suspension 30 mL  30 mL Oral Daily PRN Motley-Mangrum, Jadeka A, PMHNP       [START ON 05/17/2024] paliperidone  (INVEGA  SUSTENNA) injection 156 mg  156 mg Intramuscular Q30 days Millington, Matthew E, PA-C       paliperidone  (INVEGA  SUSTENNA) injection 234 mg  234 mg Intramuscular Once Millington, Matthew E, PA-C       traZODone  (DESYREL ) tablet 50 mg  50 mg Oral QHS  PRN Motley-Mangrum, Jadeka A, PMHNP   50 mg at 05/12/24 2145   No Known Allergies Principal Problem:   Bipolar 1 disorder (HCC)  Blood pressure 117/82, pulse 81, temperature (!) 97.5 F (36.4 C), resp. rate 20, height 5' 4 (1.626 m), weight 109.3 kg, SpO2 98%.  Subjective Objective Assessment & Plan  Lauren Poole 05/13/2024

## 2024-05-13 NOTE — Plan of Care (Signed)
  Problem: Education: Goal: Emotional status will improve Outcome: Progressing   Problem: Education: Goal: Mental status will improve Outcome: Progressing   Problem: Activity: Goal: Interest or engagement in activities will improve Outcome: Progressing   Problem: Physical Regulation: Goal: Ability to maintain clinical measurements within normal limits will improve Outcome: Progressing   Problem: Health Behavior/Discharge Planning: Goal: Compliance with treatment plan for underlying cause of condition will improve Outcome: Progressing

## 2024-05-13 NOTE — Progress Notes (Signed)
   05/13/24 1600  Psych Admission Type (Psych Patients Only)  Admission Status Voluntary  Psychosocial Assessment  Patient Complaints None  Eye Contact Fair  Facial Expression Animated  Affect Appropriate to circumstance  Speech Logical/coherent  Interaction Assertive  Motor Activity Other (Comment) (WNL)  Appearance/Hygiene Unremarkable  Behavior Characteristics Cooperative;Appropriate to situation  Mood Pleasant  Thought Process  Coherency WDL  Content WDL  Delusions None reported or observed  Perception WDL  Hallucination None reported or observed  Judgment Poor  Confusion None  Danger to Self  Current suicidal ideation? Denies  Agreement Not to Harm Self Yes  Description of Agreement Verbal  Danger to Others  Danger to Others None reported or observed

## 2024-05-13 NOTE — Group Note (Signed)
 Date:  05/13/2024 Time:  12:48 PM  Group Topic/Focus:  Personal Choices and Values:   The focus of this group is to help patients assess and explore the importance of values in their lives, how their values affect their decisions, how they express their values and what opposes their expression.    Participation Level:  Active  Participation Quality:  Appropriate  Affect:  Appropriate  Cognitive:  Appropriate  Insight: Appropriate  Engagement in Group:  Engaged  Modes of Intervention:  Discussion, Education, and Support  Additional Comments:    Deitra Caron [place] 05/13/2024, 12:48 PM

## 2024-05-13 NOTE — Group Note (Signed)
 Date:  05/13/2024 Time:  11:52 PM  Group Topic/Focus:  Dimensions of Wellness:   The focus of this group is to introduce the topic of wellness and discuss the role each dimension of wellness plays in total health.    Participation Level:  Active  Participation Quality:  Appropriate  Affect:  Appropriate  Cognitive:  Appropriate  Insight: Appropriate  Engagement in Group:  Engaged  Modes of Intervention:  Discussion  Additional Comments:   Angeline Trick L 05/13/2024, 11:52 PM

## 2024-05-14 DIAGNOSIS — F319 Bipolar disorder, unspecified: Secondary | ICD-10-CM | POA: Diagnosis not present

## 2024-05-14 NOTE — Progress Notes (Signed)
 Pt lying in bed on engagement; presents preoccupied in thoughts and guarded, brief in response; I am fine.  Withdrawn, no peer engagement, did not participate in unit activity.  Endorsed anxiety rated mild; denied SI/HI, AVH and depression.   05/14/24 2200  Psych Admission Type (Psych Patients Only)  Admission Status Voluntary  Psychosocial Assessment  Patient Complaints None  Eye Contact Brief  Facial Expression Flat  Affect Preoccupied  Speech Soft  Interaction Minimal;Seclusive;Guarded  Motor Activity Other (Comment) (WDL)  Appearance/Hygiene Unremarkable  Behavior Characteristics Appropriate to situation;Guarded  Mood Preoccupied;Pleasant  Thought Process  Coherency WDL  Content Preoccupation  Delusions None reported or observed  Perception WDL  Hallucination None reported or observed  Judgment Poor  Confusion None  Danger to Self  Current suicidal ideation? Denies  Agreement Not to Harm Self Yes  Description of Agreement Verbal  Danger to Others  Danger to Others None reported or observed

## 2024-05-14 NOTE — Progress Notes (Signed)
 [name] MD Progress Note  05/08/2024 1:12 PM Lauren Poole  MRN:  [id number]  33 year old female who presented to the ED via EMS following a suicide attempt by overdose on 04/30/24. She reported ingesting approximately 30 Aleve pills. After ingestion, the patient did not experience vomiting or other acute physical symptoms. She reports that initially she wanted to not wake up, but later became fearful and contacted a friend, who encouraged her to seek care in the ED. The patient reports she has struggled with chronic suicidal ideation for about 1 year.  She states things have been hard but declines to elaborate on specific triggers.   Subjective:  Chart reviewed, case discussed in multidisciplinary meeting, patient seen during rounds.   9/28: Patient is seen for follow-up they are alert and oriented on exam.  They continue to decline LAI and indicated they will continue with the oral medication.  They continue to have poor insight into the need for medication compliance and outpatient follow-up.  They deny current SI HI and AVH.  They do not require behavioral PRNs overnight.  They have been reclusive to room.  Engaged him in discharge planning discussing options such as shelters, mother's house, or friend's house and they are reaching out to be able today it appears they will likely need to go to the shelter though.  05/13/24: Per nursing patient declined LAI this morning.  On interview she indicates she does not take any longer.  She denies SI, HI, and AVH.  She reports she is agreeable to taking the oral 3 mg dose.  She demonstrates poor insight.  Discussed her past psych records of poor medication compliance and how long-acting injectable would remove the need for her to take medication every day.  She continues to decline despite previously agreeing.  She did not require behavioral PRNs overnight they are performing ADLs.  They voiced no concerns or complaints.  Continue with some paranoia and  bizarre behavior we will continue to monitor.  05/12/24: Per follow-up they are alert and oriented. They are feeling more like themself. Still some bizarre behavior discusses that they want to fast for 7 days and are unable to explain why. Then begins discussing their current hunger. They Deny SI/HI/AVH. She does laugh inappropriately at times. Notes no dizziness this morning. Risk, benefit, and adverse effects of invega  sustenna are reviewed and pt consents to LAI. They are performing ADLS. They voice no concerns or complaints. No behavioral PRNS overnight. Act referral is being placed.   05/11/24: Per follow-up this morning they are alert and oriented.  They are pleasant and cooperative.  They continue to feel the Invega  is effective and they feel her thought processes been more linear and their mood has been more stable.  They continue to note some passive SI they deny plan or intent.  They had some dizziness this morning they are unclear on the timeframe with reference to getting the higher dose of Invega  today we will continue to monitor.  She does not appear to be internally preoccupied nor she observed to be responding to internal stimuli.  She denies HI.  Given multiple recent hospitalizations we will continue to monitor work towards setting patient up with ACT team and progress towards LAI in the coming days.  05/10/24: Patient seen for follow up. They are alert and oriented. They are pleasant and cooperative on exam. They feel that the invega  is working well and are agreeable to continued titration. They noted some passive si today without plan  or intent. Disussed medication options for depression and patient indicates she is only willing to take the Invega  at this time but that she is open to an LAI. She denies HI and AVH. Discussed ACTT services given the multiple recent hospitalizations. Sleep and appetite are stable. Had an argument with another pt during group yesterday but feels that it was  because the pt was judging them. No behavioral PRNS overnight.   05/09/24: Patient seen for follow up. They are alert and oriented. They report they are not agreeable to continuing abilify . They are not agreeable to ongoing lab test required for Lithium , will discontinue both. Discussed invega  and LAI, patient is agreeable to trial of invega  with openess to transitioning to LAI. She denies SI/HI/AVH. Notes improving sleep and stable appetite.   05/08/2024: On interview today patient is noted to be sitting up in bed.  She is more engaged in interview today.  She reports she has not started Abilify  yet but is planning to take first dose this evening.  She has started lithium  and is tolerating this well.  She endorses symptoms of depression but denies persistent anxiety.  She denies current SI/HI/plan and denies hallucinations.  She notes most recent episode of passive SI was yesterday.  She does not voice any concerns or complaints today.  She discusses desire to do something productive with her life.  05/07/2024: Per nursing report patient did not come out and remained mostly to her room.  This Clinical research associate talked with the patient and her room.  Patient reports that she got Seroquel  yesterday and still thinks sleepy because of that.  She reports that she got lithium  today in the morning for the first time and would get Abilify  tonight.  Patient continues to report having suicidal ideations.  Reports that the last time she had suicidal ideations was yesterday in the evening time and she just woke up in the morning.  Still feeling depressed and rates her depression as 5 out of 10.  Patient reports that her normal self is when she rates her depression as 2 out of 10.  Continues to report that she has thoughts of shooting herself if she would have a gun.  But she does not have any access to guns.    05/06/2024:   per staff report patient has refused to take the Seroquel  at night yesterday.  Patient is still coming out  and staying in her home.  Patient on interview reports that she came suicide attempt and reports that she had 1 previous suicide attempt this year.  Patient continues to report that she is depressed.  Patient reports that it is end of road for her.  Patient reports that she does not have access to gun but if she has done she would just get it over with.  Patient remained vague but reports that she has ongoing financial, family, life stressors.  Patient reports history consistent with bipolar 1   With manic episode lasting for 1 month but reports that these days she is not  having manic symptoms and mostly getting depressed.   9/19: On interview today patient is noted to be seated up in bed looking towards the window.  She is more engaged in interview today than she has been in recent days.   She was compliant with Seroquel  dose last night, and states she feels that it is working.  She reports tolerating medication well without adverse effects.  She is sleeping well and reports appetite as normal.  She denies SI/HI/plan and denies hallucinations.  She rates depression as a 4 out of 10 and anxiety as a 0 out of 10 today.  She does not voice any concerns or complaints today.  9/18: On interview today patient is noted to be alert and laying in bed.  She declines to communicate with provider verbally.  Patient responds to questions during interview with a head nod or head shake.  Patient declined medication last night per nursing report.  She denies SI/HI/plan and denies hallucinations via shaking her head to indicate 'no' when asked.  She declined to answer questions regarding sleep and appetite.  When asked if tolerating medication patient nods head to indicate 'yes'.  Patient declined to participate further in interview.  9/17: On interview today patient is noted to be laying in bed with hand covering her face.  She continues to be withdrawn and minimizes symptoms.  She denies current SI/HI/plan and denies  hallucinations.  She states she is tolerating initiation of Seroquel  well.  Patient declines to answer further questions.  9/16: On interview, patient is noted to be laying in bed.  Per nursing report, she has primarily been isolated to her room since her arrival on the unit.  She endorses passive SI without current intent or plan.  She denies HI/plan and denies hallucinations.  She reports she is sleeping and eating well.  Interviewer discussed treatment options with patient to include medication.  Patient states she prefers to be restarted on Seroquel , which she has taken in the past with good response.  Patient does not voice any other complaints or concerns today.   Sleep: Patient declined to answer  Appetite: Patient declined to answer  Past Psychiatric History: see h&P Family History:  Family History  Problem Relation Age of Onset   Schizophrenia Sister    Social History:  Social History   Substance and Sexual Activity  Alcohol Use Yes   Alcohol/week: 4.0 standard drinks of alcohol   Types: 4 Glasses of wine per week     Social History   Substance and Sexual Activity  Drug Use Not Currently   Types: Marijuana    Social History   Socioeconomic History   Marital status: Single    Spouse name: Not on file   Number of children: Not on file   Years of education: Not on file   Highest education level: Not on file  Occupational History   Not on file  Tobacco Use   Smoking status: Every Day    Current packs/day: 0.50    Average packs/day: 0.5 packs/day for 6.6 years (3.3 ttl pk-yrs)    Types: Cigarettes    Start date: 10/2017   Smokeless tobacco: Never  Vaping Use   Vaping status: Never Used  Substance and Sexual Activity   Alcohol use: Yes    Alcohol/week: 4.0 standard drinks of alcohol    Types: 4 Glasses of wine per week   Drug use: Not Currently    Types: Marijuana   Sexual activity: Yes    Birth control/protection: None  Other Topics Concern   Not on file   Social History Narrative   Not on file   Social Drivers of Health   Financial Resource Strain: Not on file  Food Insecurity: Food Insecurity Present (04/30/2024)   Hunger Vital Sign    Worried About Running Out of Food in the Last Year: Sometimes true    Ran Out of Food in the Last Year: Sometimes true  Transportation  Needs: Unmet Transportation Needs (04/30/2024)   PRAPARE - Administrator, Civil Service (Medical): Yes    Lack of Transportation (Non-Medical): Yes  Physical Activity: Not on file  Stress: Not on file  Social Connections: Not on file   Past Medical History:  Past Medical History:  Diagnosis Date   Bipolar 1 disorder (HCC)    Chlamydia    Gonorrhea     Past Surgical History:  Procedure Laterality Date   NO PAST SURGERIES      Current Medications: Current Facility-Administered Medications  Medication Dose Route Frequency Provider Last Rate Last Admin   acetaminophen  (TYLENOL ) tablet 650 mg  650 mg Oral Q6H PRN Motley-Mangrum, Jadeka A, PMHNP       alum & mag hydroxide-simeth (MAALOX/MYLANTA) 200-200-20 MG/5ML suspension 30 mL  30 mL Oral Q4H PRN Motley-Mangrum, Jadeka A, PMHNP       haloperidol  (HALDOL ) tablet 5 mg  5 mg Oral TID PRN Motley-Mangrum, Jadeka A, PMHNP       And   diphenhydrAMINE  (BENADRYL ) capsule 50 mg  50 mg Oral TID PRN Motley-Mangrum, Jadeka A, PMHNP       haloperidol  lactate (HALDOL ) injection 5 mg  5 mg Intramuscular TID PRN Motley-Mangrum, Jadeka A, PMHNP       And   diphenhydrAMINE  (BENADRYL ) injection 50 mg  50 mg Intramuscular TID PRN Motley-Mangrum, Jadeka A, PMHNP       And   LORazepam  (ATIVAN ) injection 2 mg  2 mg Intramuscular TID PRN Motley-Mangrum, Jadeka A, PMHNP       haloperidol  lactate (HALDOL ) injection 10 mg  10 mg Intramuscular TID PRN Motley-Mangrum, Jadeka A, PMHNP       And   diphenhydrAMINE  (BENADRYL ) injection 50 mg  50 mg Intramuscular TID PRN Motley-Mangrum, Jadeka A, PMHNP       And   LORazepam   (ATIVAN ) injection 2 mg  2 mg Intramuscular TID PRN Motley-Mangrum, Jadeka A, PMHNP       magnesium  hydroxide (MILK OF MAGNESIA) suspension 30 mL  30 mL Oral Daily PRN Motley-Mangrum, Jadeka A, PMHNP       paliperidone  (INVEGA ) 24 hr tablet 3 mg  3 mg Oral Daily Micaiah Remillard E, PA-C   3 mg at 05/14/24 9160   traZODone  (DESYREL ) tablet 50 mg  50 mg Oral QHS PRN Motley-Mangrum, Jadeka A, PMHNP   50 mg at 05/12/24 2145    Lab Results:  No results found for this or any previous visit (from the past 48 hours).   Blood Alcohol level:  Lab Results  Component Value Date   [facility] <15 04/30/2024   ETH <15 04/09/2024    Metabolic Disorder Labs: Lab Results  Component Value Date   HGBA1C 5.1 10/31/2023   MPG 99.67 10/31/2023   No results found for: PROLACTIN Lab Results  Component Value Date   CHOL 136 10/31/2023   TRIG 75 10/31/2023   HDL 37 (L) 10/31/2023   CHOLHDL 3.7 10/31/2023   VLDL 15 10/31/2023   LDLCALC 84 10/31/2023    Physical Findings: AIMS:  , ,  ,  ,    CIWA:    COWS:      Musculoskeletal: Strength & Muscle Tone: within normal limits Gait & Station: normal Assets  Assets: Communication Skills    Physical Exam: Physical Exam Vitals and nursing note reviewed.  HENT:     Head: Atraumatic.  Eyes:     Extraocular Movements: Extraocular movements intact.  Pulmonary:     Effort:  Pulmonary effort is normal.  Neurological:     Mental Status: She is alert and oriented to person, place, and time.    ROS Blood pressure (!) 127/99, pulse 79, temperature (!) 97.2 F (36.2 C), resp. rate 18, height 5' 4 (1.626 m), weight 109.3 kg, SpO2 99%. Body mass index is 41.37 kg/m.  Diagnosis: Principal Problem:   Bipolar 1 disorder (HCC)   PLAN: Safety and Monitoring:  -- Voluntary admission to inpatient psychiatric unit for safety, stabilization and treatment  -- Daily contact with patient to assess and evaluate symptoms and progress in treatment  --  Patient's case to be discussed in multi-disciplinary team meeting  -- Observation Level : q15 minute checks  -- Vital signs:  q12 hours  -- Precautions: suicide, elopement, and assault -- Encouraged patient to participate in unit milieu and in scheduled group therapies  2. Psychiatric Diagnoses and Treatment:  Reviewed case with attending Dr. Donnelly who was agreeable with proceeding with LAI. Reviewed dosing with pharmacy who agreed.  Bipolar disorder  D/C lithium  300 mg b.I.d. and Abilify  5 mg at bedtime.  Labs ordered for thyroid  panel and vitamin D .  Patient changed her mind and now is not taking long-acting injectable they have since been discontinued we will restart Invega  3 mg daily as she does not agree to take the 6 mg dose             The risks/benefits/side-effects/alternatives to this medication were discussed in detail with the patient and time was given for questions. The patient consents to medication trial. -- Metabolic profile and EKG monitoring obtained while on an atypical antipsychotic  -- Encouraged patient to participate in unit milieu and in scheduled group therapies                    3. Medical Issues Being Addressed:  No medical issues identified at this time.   4. Discharge Planning:  Patient to be referred for ACT services.   Plan for discharge Wednesday  -- Social work and case management to assist with discharge planning and identification of hospital follow-up needs prior to discharge  -- Estimated LOS: 1-2 weeks  Donnice FORBES Right, PA-C 05/14/2024, 1:12 PM

## 2024-05-14 NOTE — Progress Notes (Signed)
   05/14/24 0839  Psych Admission Type (Psych Patients Only)  Admission Status Voluntary  Psychosocial Assessment  Patient Complaints None  Eye Contact Brief  Facial Expression Sad  Affect Sad;Preoccupied  Speech Soft  Interaction Minimal;Guarded  Motor Activity Other (Comment) (WDL)  Appearance/Hygiene In scrubs  Behavior Characteristics Cooperative;Guarded  Mood Sad;Preoccupied  Thought Process  Coherency WDL  Content Preoccupation  Delusions None reported or observed  Perception WDL  Hallucination None reported or observed  Judgment Poor  Confusion None  Danger to Self  Current suicidal ideation? Denies  Agreement Not to Harm Self Yes  Description of Agreement Verbal  Danger to Others  Danger to Others None reported or observed

## 2024-05-14 NOTE — Plan of Care (Signed)
  Problem: Education: Goal: Knowledge of [facility] Education information/materials will improve Outcome: Progressing   Problem: Education: Goal: Emotional status will improve Outcome: Progressing   Problem: Education: Goal: Mental status will improve Outcome: Progressing   

## 2024-05-14 NOTE — Group Note (Signed)
 Date:  05/14/2024 Time:  6:23 PM  Group Topic/Focus:  Healthy Communication:   The focus of this group is to discuss communication, barriers to communication, as well as healthy ways to communicate with others.    Participation Level:  Did Not Attend   Lauren Poole 05/14/2024, 6:23 PM

## 2024-05-14 NOTE — Plan of Care (Signed)
   Problem: Education: Goal: Emotional status will improve Outcome: Progressing Goal: Mental status will improve Outcome: Progressing Goal: Verbalization of understanding the information provided will improve Outcome: Progressing

## 2024-05-14 NOTE — Group Note (Signed)
 Date:  05/14/2024 Time:  9:02 PM  Group Topic/Focus:  Wrap-Up Group:   The focus of this group is to help patients review their daily goal of treatment and discuss progress on daily workbooks.    Participation Level:  Did Not Attend   Deitra Clap [facility] 05/14/2024, 9:02 PM

## 2024-05-15 NOTE — Plan of Care (Signed)
  Problem: Education: Goal: Mental status will improve Outcome: Progressing Goal: Verbalization of understanding the information provided will improve Outcome: Progressing   Problem: Coping: Goal: Ability to verbalize frustrations and anger appropriately will improve Outcome: Progressing Goal: Ability to demonstrate self-control will improve Outcome: Progressing   Problem: Safety: Goal: Periods of time without injury will increase Outcome: Progressing

## 2024-05-15 NOTE — Progress Notes (Signed)
 [facility] MD Progress Note  05/15/2024 5:09 PM Lauren Poole  MRN:  [id number]  33 year old female who presented to the ED via EMS following a suicide attempt by overdose on 04/30/24. She reported ingesting approximately 30 Aleve pills. After ingestion, the patient did not experience vomiting or other acute physical symptoms. She reports that initially she wanted to not wake up, but later became fearful and contacted a friend, who encouraged her to seek care in the ED. The patient reports she has struggled with chronic suicidal ideation for about 1 year.  She states things have been hard but declines to elaborate on specific triggers.   Subjective:  Chart reviewed, case discussed in multidisciplinary meeting, patient seen during rounds.   9/29: On interview today, patient is found to be laying in bed and is minimally engaging with the provider.  She shows apathy, poor insight, and is unable to discuss her treatment goals.  Patient declined LAI.  She reports some emotional blunting with Invega  but otherwise is tolerating it well.  She states she initially found the medication beneficial in the first few days of taking it.  When asked about discharge planning, patient states she is not sure where she will go and states she does not care where she goes, [place]  When asked about support system, patient states that she does not have any friends or family.  She denies SI/HI/plan and denies hallucinations.  Patient declines to participate further in the interview.  9/28: Patient is seen for follow-up they are alert and oriented on exam.  They continue to decline LAI and indicated they will continue with the oral medication.  They continue to have poor insight into the need for medication compliance and outpatient follow-up.  They deny current SI HI and AVH.  They do not require behavioral PRNs overnight.  They have been reclusive to room.  Engaged him in discharge  planning discussing options such as shelters, mother's house, or friend's house and they are reaching out to be able today it appears they will likely need to go to the shelter though.  05/13/24: Per nursing patient declined LAI this morning.  On interview she indicates she does not take any longer.  She denies SI, HI, and AVH.  She reports she is agreeable to taking the oral 3 mg dose.  She demonstrates poor insight.  Discussed her past psych records of poor medication compliance and how long-acting injectable would remove the need for her to take medication every day.  She continues to decline despite previously agreeing.  She did not require behavioral PRNs overnight they are performing ADLs.  They voiced no concerns or complaints.  Continue with some paranoia and bizarre behavior we will continue to monitor.  05/12/24: Per follow-up they are alert and oriented. They are feeling more like themself. Still some bizarre behavior discusses that they want to fast for 7 days and are unable to explain why. Then begins discussing their current hunger. They Deny SI/HI/AVH. She does laugh inappropriately at times. Notes no dizziness this morning. Risk, benefit, and adverse effects of invega  sustenna are reviewed and pt consents to LAI. They are performing ADLS. They voice no concerns or complaints. No behavioral PRNS overnight. Act referral is being placed.   05/11/24: Per follow-up this morning they are alert and oriented.  They are pleasant and cooperative.  They continue to feel the Invega  is effective and they feel her thought processes been more linear  and their mood has been more stable.  They continue to note some passive SI they deny plan or intent.  They had some dizziness this morning they are unclear on the timeframe with reference to getting the higher dose of Invega  today we will continue to monitor.  She does not appear to be internally preoccupied nor she observed to be responding to internal stimuli.   She denies HI.  Given multiple recent hospitalizations we will continue to monitor work towards setting patient up with ACT team and progress towards LAI in the coming days.  05/10/24: Patient seen for follow up. They are alert and oriented. They are pleasant and cooperative on exam. They feel that the invega  is working well and are agreeable to continued titration. They noted some passive si today without plan or intent. Disussed medication options for depression and patient indicates she is only willing to take the Invega  at this time but that she is open to an LAI. She denies HI and AVH. Discussed ACTT services given the multiple recent hospitalizations. Sleep and appetite are stable. Had an argument with another pt during group yesterday but feels that it was because the pt was judging them. No behavioral PRNS overnight.   05/09/24: Patient seen for follow up. They are alert and oriented. They report they are not agreeable to continuing abilify . They are not agreeable to ongoing lab test required for Lithium , will discontinue both. Discussed invega  and LAI, patient is agreeable to trial of invega  with openess to transitioning to LAI. She denies SI/HI/AVH. Notes improving sleep and stable appetite.   05/08/2024: On interview today patient is noted to be sitting up in bed.  She is more engaged in interview today.  She reports she has not started Abilify  yet but is planning to take first dose this evening.  She has started lithium  and is tolerating this well.  She endorses symptoms of depression but denies persistent anxiety.  She denies current SI/HI/plan and denies hallucinations.  She notes most recent episode of passive SI was yesterday.  She does not voice any concerns or complaints today.  She discusses desire to do something productive with her life.  05/07/2024: Per nursing report patient did not come out and remained mostly to her room.  This Clinical research associate talked with the patient and her room.  Patient  reports that she got Seroquel  yesterday and still thinks sleepy because of that.  She reports that she got lithium  today in the morning for the first time and would get Abilify  tonight.  Patient continues to report having suicidal ideations.  Reports that the last time she had suicidal ideations was yesterday in the evening time and she just woke up in the morning.  Still feeling depressed and rates her depression as 5 out of 10.  Patient reports that her normal self is when she rates her depression as 2 out of 10.  Continues to report that she has thoughts of shooting herself if she would have a gun.  But she does not have any access to guns.    05/06/2024:   per staff report patient has refused to take the Seroquel  at night yesterday.  Patient is still coming out and staying in her home.  Patient on interview reports that she came suicide attempt and reports that she had 1 previous suicide attempt this year.  Patient continues to report that she is depressed.  Patient reports that it is end of road for her.  Patient reports that she  does not have access to gun but if she has done she would just get it over with.  Patient remained vague but reports that she has ongoing financial, family, life stressors.  Patient reports history consistent with bipolar 1   With manic episode lasting for 1 month but reports that these days she is not  having manic symptoms and mostly getting depressed.   9/19: On interview today patient is noted to be seated up in bed looking towards the window.  She is more engaged in interview today than she has been in recent days.   She was compliant with Seroquel  dose last night, and states she feels that it is working.  She reports tolerating medication well without adverse effects.  She is sleeping well and reports appetite as normal.  She denies SI/HI/plan and denies hallucinations.  She rates depression as a 4 out of 10 and anxiety as a 0 out of 10 today.  She does not voice any  concerns or complaints today.  9/18: On interview today patient is noted to be alert and laying in bed.  She declines to communicate with provider verbally.  Patient responds to questions during interview with a head nod or head shake.  Patient declined medication last night per nursing report.  She denies SI/HI/plan and denies hallucinations via shaking her head to indicate 'no' when asked.  She declined to answer questions regarding sleep and appetite.  When asked if tolerating medication patient nods head to indicate 'yes'.  Patient declined to participate further in interview.  9/17: On interview today patient is noted to be laying in bed with hand covering her face.  She continues to be withdrawn and minimizes symptoms.  She denies current SI/HI/plan and denies hallucinations.  She states she is tolerating initiation of Seroquel  well.  Patient declines to answer further questions.  9/16: On interview, patient is noted to be laying in bed.  Per nursing report, she has primarily been isolated to her room since her arrival on the unit.  She endorses passive SI without current intent or plan.  She denies HI/plan and denies hallucinations.  She reports she is sleeping and eating well.  Interviewer discussed treatment options with patient to include medication.  Patient states she prefers to be restarted on Seroquel , which she has taken in the past with good response.  Patient does not voice any other complaints or concerns today.   Sleep: Fair  Appetite: Fair  Past Psychiatric History: see h&P Family History:  Family History  Problem Relation Age of Onset   Schizophrenia Sister    Social History:  Social History   Substance and Sexual Activity  Alcohol Use Yes   Alcohol/week: 4.0 standard drinks of alcohol   Types: 4 Glasses of wine per week     Social History   Substance and Sexual Activity  Drug Use Not Currently   Types: Marijuana    Social History   Socioeconomic History    Marital status: Single    Spouse name: Not on file   Number of children: Not on file   Years of education: Not on file   Highest education level: Not on file  Occupational History   Not on file  Tobacco Use   Smoking status: Every Day    Current packs/day: 0.50    Average packs/day: 0.5 packs/day for 6.6 years (3.3 ttl pk-yrs)    Types: Cigarettes    Start date: 10/2017   Smokeless tobacco: Never  Vaping Use  Vaping status: Never Used  Substance and Sexual Activity   Alcohol use: Yes    Alcohol/week: 4.0 standard drinks of alcohol    Types: 4 Glasses of wine per week   Drug use: Not Currently    Types: Marijuana   Sexual activity: Yes    Birth control/protection: None  Other Topics Concern   Not on file  Social History Narrative   Not on file   Social Drivers of Health   Financial Resource Strain: Not on file  Food Insecurity: Food Insecurity Present (04/30/2024)   Hunger Vital Sign    Worried About Running Out of Food in the Last Year: Sometimes true    Ran Out of Food in the Last Year: Sometimes true  Transportation Needs: Unmet Transportation Needs (04/30/2024)   PRAPARE - Administrator, Civil Service (Medical): Yes    Lack of Transportation (Non-Medical): Yes  Physical Activity: Not on file  Stress: Not on file  Social Connections: Not on file   Past Medical History:  Past Medical History:  Diagnosis Date   Bipolar 1 disorder (HCC)    Chlamydia    Gonorrhea     Past Surgical History:  Procedure Laterality Date   NO PAST SURGERIES      Current Medications: Current Facility-Administered Medications  Medication Dose Route Frequency Provider Last Rate Last Admin   acetaminophen  (TYLENOL ) tablet 650 mg  650 mg Oral Q6H PRN Motley-Mangrum, Jadeka A, PMHNP       alum & mag hydroxide-simeth (MAALOX/MYLANTA) 200-200-20 MG/5ML suspension 30 mL  30 mL Oral Q4H PRN Motley-Mangrum, Jadeka A, PMHNP       haloperidol  (HALDOL ) tablet 5 mg  5 mg Oral TID PRN  Motley-Mangrum, Jadeka A, PMHNP       And   diphenhydrAMINE  (BENADRYL ) capsule 50 mg  50 mg Oral TID PRN Motley-Mangrum, Jadeka A, PMHNP       haloperidol  lactate (HALDOL ) injection 5 mg  5 mg Intramuscular TID PRN Motley-Mangrum, Jadeka A, PMHNP       And   diphenhydrAMINE  (BENADRYL ) injection 50 mg  50 mg Intramuscular TID PRN Motley-Mangrum, Jadeka A, PMHNP       And   LORazepam  (ATIVAN ) injection 2 mg  2 mg Intramuscular TID PRN Motley-Mangrum, Jadeka A, PMHNP       haloperidol  lactate (HALDOL ) injection 10 mg  10 mg Intramuscular TID PRN Motley-Mangrum, Jadeka A, PMHNP       And   diphenhydrAMINE  (BENADRYL ) injection 50 mg  50 mg Intramuscular TID PRN Motley-Mangrum, Jadeka A, PMHNP       And   LORazepam  (ATIVAN ) injection 2 mg  2 mg Intramuscular TID PRN Motley-Mangrum, Jadeka A, PMHNP       magnesium  hydroxide (MILK OF MAGNESIA) suspension 30 mL  30 mL Oral Daily PRN Motley-Mangrum, Jadeka A, PMHNP       paliperidone  (INVEGA ) 24 hr tablet 3 mg  3 mg Oral Daily Millington, Matthew E, PA-C   3 mg at 05/15/24 0831   traZODone  (DESYREL ) tablet 50 mg  50 mg Oral QHS PRN Motley-Mangrum, Jadeka A, PMHNP   50 mg at 05/14/24 2105    Lab Results:  No results found for this or any previous visit (from the past 48 hours).   Blood Alcohol level:  Lab Results  Component Value Date   [facility] <15 04/30/2024   ETH <15 04/09/2024    Metabolic Disorder Labs: Lab Results  Component Value Date   HGBA1C 5.1 10/31/2023  MPG 99.67 10/31/2023   No results found for: PROLACTIN Lab Results  Component Value Date   CHOL 136 10/31/2023   TRIG 75 10/31/2023   HDL 37 (L) 10/31/2023   CHOLHDL 3.7 10/31/2023   VLDL 15 10/31/2023   LDLCALC 84 10/31/2023    Physical Findings: AIMS:  , ,  ,  ,    CIWA:    COWS:      Musculoskeletal: Strength & Muscle Tone: within normal limits Gait & Station: normal Assets  Assets: Communication Skills    Physical Exam: Physical Exam Vitals and  nursing note reviewed.  HENT:     Head: Atraumatic.  Eyes:     Extraocular Movements: Extraocular movements intact.  Pulmonary:     Effort: Pulmonary effort is normal.  Neurological:     Mental Status: She is alert and oriented to person, place, and time.    ROS Blood pressure (!) 122/95, pulse 75, temperature 98 F (36.7 C), resp. rate 17, height 5' 4 (1.626 m), weight 109.3 kg, SpO2 99%. Body mass index is 41.37 kg/m.  Diagnosis: Principal Problem:   Bipolar 1 disorder (HCC)   PLAN: Safety and Monitoring:  -- Voluntary admission to inpatient psychiatric unit for safety, stabilization and treatment  -- Daily contact with patient to assess and evaluate symptoms and progress in treatment  -- Patient's case to be discussed in multi-disciplinary team meeting  -- Observation Level : q15 minute checks  -- Vital signs:  q12 hours  -- Precautions: suicide, elopement, and assault -- Encouraged patient to participate in unit milieu and in scheduled group therapies  2. Psychiatric Diagnoses and Treatment:  Bipolar disorder  Reviewed case with attending Dr. Donnelly who was agreeable with proceeding with LAI. Reviewed dosing with pharmacy who agreed.  Patient subsequently declined LAI. D/C lithium  300 mg b.I.d. and Abilify  5 mg at bedtime.  Labs ordered for thyroid  panel and vitamin D .  Patient changed her mind and now is not taking long-acting injectable they have since been discontinued we will restart Invega  3 mg daily as she does not agree to take the 6 mg dose             The risks/benefits/side-effects/alternatives to this medication were discussed in detail with the patient and time was given for questions. The patient consents to medication trial. -- Metabolic profile and EKG monitoring obtained while on an atypical antipsychotic  -- Encouraged patient to participate in unit milieu and in scheduled group therapies                    3. Medical Issues Being Addressed:  No  medical issues identified at this time.   4. Discharge Planning:  Patient to be referred for ACT services.    -- Social work and case management to assist with discharge planning and identification of hospital follow-up needs prior to discharge  -- Estimated LOS: 1-2 weeks  Lauren LITTIE Lukes, PA-C 05/15/2024, 5:09 PM

## 2024-05-15 NOTE — Plan of Care (Signed)
   Problem: Education: Goal: Knowledge of [facility] Education information/materials will improve Outcome: Progressing Goal: Emotional status will improve Outcome: Progressing Goal: Mental status will improve Outcome: Progressing Goal: Verbalization of understanding the information provided will improve Outcome: Progressing   Problem: Activity: Goal: Interest or engagement in activities will improve Outcome: Progressing Goal: Sleeping patterns will improve Outcome: Progressing   Problem: Coping: Goal: Ability to verbalize frustrations and anger appropriately will improve Outcome: Progressing Goal: Ability to demonstrate self-control will improve Outcome: Progressing

## 2024-05-15 NOTE — Group Note (Signed)
 [facility] LCSW Group Therapy Note    Group Date: 05/15/2024 Start Time: 1300 End Time: 1340  Type of Therapy and Topic:  Group Therapy:  Overcoming Obstacles  Participation Level:  [facility] PARTICIPATION LEVEL: Did Not Attend   Description of Group:   In this group patients will be encouraged to explore what they see as obstacles to their own wellness and recovery. They will be guided to discuss their thoughts, feelings, and behaviors related to these obstacles. The group will process together ways to cope with barriers, with attention given to specific choices patients can make. Each patient will be challenged to identify changes they are motivated to make in order to overcome their obstacles. This group will be process-oriented, with patients participating in exploration of their own experiences as well as giving and receiving support and challenge from other group members.  Therapeutic Goals: 1. Patient will identify personal and current obstacles as they relate to admission. 2. Patient will identify barriers that currently interfere with their wellness or overcoming obstacles.  3. Patient will identify feelings, thought process and behaviors related to these barriers. 4. Patient will identify two changes they are willing to make to overcome these obstacles:    Summary of Patient Progress X   Therapeutic Modalities:   Cognitive Behavioral Therapy Solution Focused Therapy Motivational Interviewing Relapse Prevention Therapy   Nadara JONELLE Fam, LCSW

## 2024-05-15 NOTE — Group Note (Signed)
 Recreation Therapy Group Note   Group Topic:Coping Skills  Group Date: 05/15/2024 Start Time: 1045 End Time: 1135 Facilitators: Celestia Jeoffrey FORBES ARTICE, CTRS Location: Craft Room  Group Description: Mind Map.  Patient was provided a blank template of a diagram with 32 blank boxes in a tiered system, branching from the center (similar to a bubble chart). LRT directed patients to label the middle of the diagram Coping Skills. LRT and patients then came up with 8 different coping skills as examples. Pt were directed to record their coping skills in the 2nd tier boxes closest to the center.  Patients would then share their coping skills with the group as LRT wrote them out. LRT gave a handout of 99 different coping skills at the end of group.   Goal Area(s) Addressed: Patients will be able to define "coping skills". Patient will identify new coping skills.  Patient will increase communication.   Affect/Mood: N/A   Participation Level: Did not attend    Clinical Observations/Individualized Feedback: Patient did not attend group.   Plan: Continue to engage patient in RT group sessions 2-3x/week.   Jeoffrey FORBES Celestia, LRT, CTRS 05/15/2024 1:07 PM

## 2024-05-15 NOTE — Group Note (Signed)
 Date:  05/15/2024 Time:  8:58 PM  Group Topic/Focus:  Overcoming Stress:   The focus of this group is to define stress and help patients assess their triggers. Wrap-Up Group:   The focus of this group is to help patients review their daily goal of treatment and discuss progress on daily workbooks.    Participation Level:  Did Not Attend    Lauren Poole 05/15/2024, 8:58 PM

## 2024-05-15 NOTE — Progress Notes (Addendum)
   05/15/24 1100  Psych Admission Type (Psych Patients Only)  Admission Status Voluntary  Psychosocial Assessment  Patient Complaints None  Eye Contact Brief  Facial Expression Sad  Affect Preoccupied  Speech Soft  Interaction Minimal;Guarded;Seclusive  Motor Activity Other (Comment) (WNL)  Appearance/Hygiene Unremarkable  Behavior Characteristics Unwilling to participate  Mood Preoccupied  Thought Process  Coherency WDL  Content Preoccupation  Delusions None reported or observed  Perception WDL  Hallucination None reported or observed  Judgment Impaired  Confusion None  Danger to Self  Current suicidal ideation? Denies  Agreement Not to Harm Self Yes  Description of Agreement verbal  Danger to Others  Danger to Others None reported or observed   Patient refused P.O medications and not receptive with staff this morning. Later patient took the medications and apologized staff. Patient states  my body does not need medicine. Patient refused to take injections.

## 2024-05-16 MED ORDER — QUETIAPINE FUMARATE ER 50 MG PO TB24
100.0000 mg | ORAL_TABLET | Freq: Every day | ORAL | Status: DC
  Administered 2024-05-16 – 2024-05-17 (×2): 100 mg via ORAL
  Filled 2024-05-16 (×2): qty 2

## 2024-05-16 NOTE — Progress Notes (Signed)
   05/16/24 2100  Psych Admission Type (Psych Patients Only)  Admission Status Voluntary  Psychosocial Assessment  Patient Complaints None  Eye Contact Poor  Facial Expression Sad;Flat  Affect Sad  Speech Soft  Interaction No initiation  Motor Activity Slow  Appearance/Hygiene In scrubs  Behavior Characteristics Cooperative  Thought Process  Coherency WDL  Content WDL  Delusions None reported or observed  Perception WDL  Hallucination None reported or observed  Judgment Poor  Confusion None  Danger to Self  Current suicidal ideation? Denies  Agreement Not to Harm Self Yes  Description of Agreement Verbalized  Danger to Others  Danger to Others None reported or observed

## 2024-05-16 NOTE — [facility] Counselor (Signed)
 ACTT referral completed for [facility].   Lauren Poole. Chaim, MSW, LCSW, LCAS 05/16/2024 9:02 AM

## 2024-05-16 NOTE — Progress Notes (Signed)
 [facility] MD Progress Note  05/16/2024 4:34 PM Lauren Poole  MRN:  [id number]  33 year old female who presented to the ED via EMS following a suicide attempt by overdose on 04/30/24. She reported ingesting approximately 30 Aleve pills. After ingestion, the patient did not experience vomiting or other acute physical symptoms. She reports that initially she wanted to not wake up, but later became fearful and contacted a friend, who encouraged her to seek care in the ED. The patient reports she has struggled with chronic suicidal ideation for about 1 year.  She states things have been hard but declines to elaborate on specific triggers.   Subjective:  Chart reviewed, case discussed in multidisciplinary meeting, patient seen during rounds.   9/30: On interview today, patient is noted to be laying in bed with a pillow covering her face.  She sits up to engage with provider.  She rates depression as a 2-3 out of 10 and anxiety as 4-5 out of 10 today.  She denies SI/HI/plan and denies hallucinations.  She is sleeping well and appetite is stable.  She states she does not like Invega  and refused dose today, stating it has made her feel more down than even.  She requests to discontinue Invega  for this reason.  She would like to restart Seroquel , though requests to start at a lower dose than what she was previously taking which was 200 mg, stating she felt tired with 200 mg dose.  Case discussed with attending physician and social work team.  Referral to be placed to APS.  9/29: On interview today, patient is found to be laying in bed and is minimally engaging with the provider.  She shows apathy, poor insight, and is unable to discuss her treatment goals.  Patient declined LAI.  She reports some emotional blunting with Invega  but otherwise is tolerating it well.  She states she initially found the medication beneficial in the first few days of taking it.  When asked about discharge planning, patient states  she is not sure where she will go and states she does not care where she goes, [place]  When asked about support system, patient states that she does not have any friends or family.  She denies SI/HI/plan and denies hallucinations.  Patient declines to participate further in the interview.  9/28: Patient is seen for follow-up they are alert and oriented on exam.  They continue to decline LAI and indicated they will continue with the oral medication.  They continue to have poor insight into the need for medication compliance and outpatient follow-up.  They deny current SI HI and AVH.  They do not require behavioral PRNs overnight.  They have been reclusive to room.  Engaged him in discharge planning discussing options such as shelters, mother's house, or friend's house and they are reaching out to be able today it appears they will likely need to go to the shelter though.  05/13/24: Per nursing patient declined LAI this morning.  On interview she indicates she does not take any longer.  She denies SI, HI, and AVH.  She reports she is agreeable to taking the oral 3 mg dose.  She demonstrates poor insight.  Discussed her past psych records of poor medication compliance and how long-acting injectable would remove the need for her to take medication every day.  She continues to decline despite previously agreeing.  She did not require behavioral PRNs overnight they are performing ADLs.  They  voiced no concerns or complaints.  Continue with some paranoia and bizarre behavior we will continue to monitor.  05/12/24: Per follow-up they are alert and oriented. They are feeling more like themself. Still some bizarre behavior discusses that they want to fast for 7 days and are unable to explain why. Then begins discussing their current hunger. They Deny SI/HI/AVH. She does laugh inappropriately at times. Notes no dizziness this morning. Risk, benefit, and adverse effects of invega   sustenna are reviewed and pt consents to LAI. They are performing ADLS. They voice no concerns or complaints. No behavioral PRNS overnight. Act referral is being placed.   05/11/24: Per follow-up this morning they are alert and oriented.  They are pleasant and cooperative.  They continue to feel the Invega  is effective and they feel her thought processes been more linear and their mood has been more stable.  They continue to note some passive SI they deny plan or intent.  They had some dizziness this morning they are unclear on the timeframe with reference to getting the higher dose of Invega  today we will continue to monitor.  She does not appear to be internally preoccupied nor she observed to be responding to internal stimuli.  She denies HI.  Given multiple recent hospitalizations we will continue to monitor work towards setting patient up with ACT team and progress towards LAI in the coming days.  05/10/24: Patient seen for follow up. They are alert and oriented. They are pleasant and cooperative on exam. They feel that the invega  is working well and are agreeable to continued titration. They noted some passive si today without plan or intent. Disussed medication options for depression and patient indicates she is only willing to take the Invega  at this time but that she is open to an LAI. She denies HI and AVH. Discussed ACTT services given the multiple recent hospitalizations. Sleep and appetite are stable. Had an argument with another pt during group yesterday but feels that it was because the pt was judging them. No behavioral PRNS overnight.   05/09/24: Patient seen for follow up. They are alert and oriented. They report they are not agreeable to continuing abilify . They are not agreeable to ongoing lab test required for Lithium , will discontinue both. Discussed invega  and LAI, patient is agreeable to trial of invega  with openess to transitioning to LAI. She denies SI/HI/AVH. Notes improving sleep and  stable appetite.   05/08/2024: On interview today patient is noted to be sitting up in bed.  She is more engaged in interview today.  She reports she has not started Abilify  yet but is planning to take first dose this evening.  She has started lithium  and is tolerating this well.  She endorses symptoms of depression but denies persistent anxiety.  She denies current SI/HI/plan and denies hallucinations.  She notes most recent episode of passive SI was yesterday.  She does not voice any concerns or complaints today.  She discusses desire to do something productive with her life.  05/07/2024: Per nursing report patient did not come out and remained mostly to her room.  This Clinical research associate talked with the patient and her room.  Patient reports that she got Seroquel  yesterday and still thinks sleepy because of that.  She reports that she got lithium  today in the morning for the first time and would get Abilify  tonight.  Patient continues to report having suicidal ideations.  Reports that the last time she had suicidal ideations was yesterday in the evening time  and she just woke up in the morning.  Still feeling depressed and rates her depression as 5 out of 10.  Patient reports that her normal self is when she rates her depression as 2 out of 10.  Continues to report that she has thoughts of shooting herself if she would have a gun.  But she does not have any access to guns.    05/06/2024:   per staff report patient has refused to take the Seroquel  at night yesterday.  Patient is still coming out and staying in her home.  Patient on interview reports that she came suicide attempt and reports that she had 1 previous suicide attempt this year.  Patient continues to report that she is depressed.  Patient reports that it is end of road for her.  Patient reports that she does not have access to gun but if she has done she would just get it over with.  Patient remained vague but reports that she has ongoing financial,  family, life stressors.  Patient reports history consistent with bipolar 1   With manic episode lasting for 1 month but reports that these days she is not  having manic symptoms and mostly getting depressed.   9/19: On interview today patient is noted to be seated up in bed looking towards the window.  She is more engaged in interview today than she has been in recent days.   She was compliant with Seroquel  dose last night, and states she feels that it is working.  She reports tolerating medication well without adverse effects.  She is sleeping well and reports appetite as normal.  She denies SI/HI/plan and denies hallucinations.  She rates depression as a 4 out of 10 and anxiety as a 0 out of 10 today.  She does not voice any concerns or complaints today.  9/18: On interview today patient is noted to be alert and laying in bed.  She declines to communicate with provider verbally.  Patient responds to questions during interview with a head nod or head shake.  Patient declined medication last night per nursing report.  She denies SI/HI/plan and denies hallucinations via shaking her head to indicate 'no' when asked.  She declined to answer questions regarding sleep and appetite.  When asked if tolerating medication patient nods head to indicate 'yes'.  Patient declined to participate further in interview.  9/17: On interview today patient is noted to be laying in bed with hand covering her face.  She continues to be withdrawn and minimizes symptoms.  She denies current SI/HI/plan and denies hallucinations.  She states she is tolerating initiation of Seroquel  well.  Patient declines to answer further questions.  9/16: On interview, patient is noted to be laying in bed.  Per nursing report, she has primarily been isolated to her room since her arrival on the unit.  She endorses passive SI without current intent or plan.  She denies HI/plan and denies hallucinations.  She reports she is sleeping and eating  well.  Interviewer discussed treatment options with patient to include medication.  Patient states she prefers to be restarted on Seroquel , which she has taken in the past with good response.  Patient does not voice any other complaints or concerns today.   Sleep: Good  Appetite: Fair  Past Psychiatric History: see h&P Family History:  Family History  Problem Relation Age of Onset   Schizophrenia Sister    Social History:  Social History   Substance and Sexual Activity  Alcohol Use Yes   Alcohol/week: 4.0 standard drinks of alcohol   Types: 4 Glasses of wine per week     Social History   Substance and Sexual Activity  Drug Use Not Currently   Types: Marijuana    Social History   Socioeconomic History   Marital status: Single    Spouse name: Not on file   Number of children: Not on file   Years of education: Not on file   Highest education level: Not on file  Occupational History   Not on file  Tobacco Use   Smoking status: Every Day    Current packs/day: 0.50    Average packs/day: 0.5 packs/day for 6.6 years (3.3 ttl pk-yrs)    Types: Cigarettes    Start date: 10/2017   Smokeless tobacco: Never  Vaping Use   Vaping status: Never Used  Substance and Sexual Activity   Alcohol use: Yes    Alcohol/week: 4.0 standard drinks of alcohol    Types: 4 Glasses of wine per week   Drug use: Not Currently    Types: Marijuana   Sexual activity: Yes    Birth control/protection: None  Other Topics Concern   Not on file  Social History Narrative   Not on file   Social Drivers of Health   Financial Resource Strain: Not on file  Food Insecurity: Food Insecurity Present (04/30/2024)   Hunger Vital Sign    Worried About Running Out of Food in the Last Year: Sometimes true    Ran Out of Food in the Last Year: Sometimes true  Transportation Needs: Unmet Transportation Needs (04/30/2024)   PRAPARE - Administrator, Civil Service (Medical): Yes    Lack of  Transportation (Non-Medical): Yes  Physical Activity: Not on file  Stress: Not on file  Social Connections: Not on file   Past Medical History:  Past Medical History:  Diagnosis Date   Bipolar 1 disorder (HCC)    Chlamydia    Gonorrhea     Past Surgical History:  Procedure Laterality Date   NO PAST SURGERIES      Current Medications: Current Facility-Administered Medications  Medication Dose Route Frequency Provider Last Rate Last Admin   acetaminophen  (TYLENOL ) tablet 650 mg  650 mg Oral Q6H PRN Motley-Mangrum, Jadeka A, PMHNP       alum & mag hydroxide-simeth (MAALOX/MYLANTA) 200-200-20 MG/5ML suspension 30 mL  30 mL Oral Q4H PRN Motley-Mangrum, Jadeka A, PMHNP       haloperidol  (HALDOL ) tablet 5 mg  5 mg Oral TID PRN Motley-Mangrum, Jadeka A, PMHNP       And   diphenhydrAMINE  (BENADRYL ) capsule 50 mg  50 mg Oral TID PRN Motley-Mangrum, Jadeka A, PMHNP       haloperidol  lactate (HALDOL ) injection 5 mg  5 mg Intramuscular TID PRN Motley-Mangrum, Jadeka A, PMHNP       And   diphenhydrAMINE  (BENADRYL ) injection 50 mg  50 mg Intramuscular TID PRN Motley-Mangrum, Jadeka A, PMHNP       And   LORazepam  (ATIVAN ) injection 2 mg  2 mg Intramuscular TID PRN Motley-Mangrum, Jadeka A, PMHNP       haloperidol  lactate (HALDOL ) injection 10 mg  10 mg Intramuscular TID PRN Motley-Mangrum, Jadeka A, PMHNP       And   diphenhydrAMINE  (BENADRYL ) injection 50 mg  50 mg Intramuscular TID PRN Motley-Mangrum, Jadeka A, PMHNP       And   LORazepam  (ATIVAN ) injection 2 mg  2 mg  Intramuscular TID PRN Motley-Mangrum, Jadeka A, PMHNP       magnesium  hydroxide (MILK OF MAGNESIA) suspension 30 mL  30 mL Oral Daily PRN Motley-Mangrum, Jadeka A, PMHNP       QUEtiapine  (SEROQUEL  XR) 24 hr tablet 100 mg  100 mg Oral QHS Elita Dame L, PA-C       traZODone  (DESYREL ) tablet 50 mg  50 mg Oral QHS PRN Motley-Mangrum, Jadeka A, PMHNP   50 mg at 05/14/24 2105    Lab Results:  No results found for this or any  previous visit (from the past 48 hours).   Blood Alcohol level:  Lab Results  Component Value Date   [facility] <15 04/30/2024   ETH <15 04/09/2024    Metabolic Disorder Labs: Lab Results  Component Value Date   HGBA1C 5.1 10/31/2023   MPG 99.67 10/31/2023   No results found for: PROLACTIN Lab Results  Component Value Date   CHOL 136 10/31/2023   TRIG 75 10/31/2023   HDL 37 (L) 10/31/2023   CHOLHDL 3.7 10/31/2023   VLDL 15 10/31/2023   LDLCALC 84 10/31/2023    Physical Findings: AIMS:  , ,  ,  ,    CIWA:    COWS:      Musculoskeletal: Strength & Muscle Tone: within normal limits Gait & Station: normal Assets  Assets: Communication Skills    Physical Exam: Physical Exam Vitals and nursing note reviewed.  HENT:     Head: Atraumatic.  Eyes:     Extraocular Movements: Extraocular movements intact.  Pulmonary:     Effort: Pulmonary effort is normal.  Neurological:     Mental Status: She is alert and oriented to person, place, and time.    ROS Blood pressure 125/82, pulse 82, temperature (!) 97.2 F (36.2 C), resp. rate 16, height 5' 4 (1.626 m), weight 109.3 kg, SpO2 100%. Body mass index is 41.37 kg/m.  Diagnosis: Principal Problem:   Bipolar 1 disorder (HCC)   PLAN: Safety and Monitoring:  -- Voluntary admission to inpatient psychiatric unit for safety, stabilization and treatment  -- Daily contact with patient to assess and evaluate symptoms and progress in treatment  -- Patient's case to be discussed in multi-disciplinary team meeting  -- Observation Level : q15 minute checks  -- Vital signs:  q12 hours  -- Precautions: suicide, elopement, and assault -- Encouraged patient to participate in unit milieu and in scheduled group therapies  2. Psychiatric Diagnoses and Treatment:  Bipolar disorder  Reviewed case with attending Dr. Donnelly who was agreeable with proceeding with LAI. Reviewed dosing with pharmacy who agreed.  Patient subsequently  declined LAI. D/C lithium  300 mg b.I.d. and Abilify  5 mg at bedtime.  Labs ordered for thyroid  panel and vitamin D .  Discontinued Invega  per patient request Start Seroquel  XR 100 mg nightly with plan to titrate              The risks/benefits/side-effects/alternatives to this medication were discussed in detail with the patient and time was given for questions. The patient consents to medication trial. -- Metabolic profile and EKG monitoring obtained while on an atypical antipsychotic  -- Encouraged patient to participate in unit milieu and in scheduled group therapies                    3. Medical Issues Being Addressed:  No medical issues identified at this time.   4. Discharge Planning:  Patient to be referred for ACT services.    --  Social work and case management to assist with discharge planning and identification of hospital follow-up needs prior to discharge  -- Estimated LOS: 1-2 weeks  Camelia LITTIE Lukes, PA-C 05/16/2024, 4:34 PM

## 2024-05-16 NOTE — Plan of Care (Signed)
  Problem: Education: Goal: Mental status will improve Outcome: Not Progressing   Problem: Activity: Goal: Interest or engagement in activities will improve Outcome: Not Progressing   Problem: Safety: Goal: Periods of time without injury will increase Outcome: Progressing

## 2024-05-16 NOTE — Group Note (Signed)

## 2024-05-16 NOTE — Progress Notes (Signed)
   05/16/24 0500  Psych Admission Type (Psych Patients Only)  Admission Status Voluntary  Psychosocial Assessment  Patient Complaints Irritability  Eye Contact Avoids  Facial Expression Flat  Affect Irritable;Preoccupied  Speech Soft  Interaction Hostile;Guarded;No initiation;Isolative  Motor Activity Slow  Appearance/Hygiene In scrubs  Behavior Characteristics Unable to participate;Resistant to care  Mood Preoccupied  Thought Process  Coherency WDL  Content WDL  Delusions None reported or observed  Perception WDL  Hallucination None reported or observed  Judgment Impaired  Confusion None  Danger to Self  Current suicidal ideation? Denies  Agreement Not to Harm Self Yes  Danger to Others  Danger to Others None reported or observed

## 2024-05-16 NOTE — Progress Notes (Signed)
 Patient refused scheduled Invega  stating can we skip it today? This Clinical research associate will notify providers.

## 2024-05-16 NOTE — Plan of Care (Signed)

## 2024-05-16 NOTE — Group Note (Signed)
 [facility] LCSW Group Therapy Note   Group Date: 05/16/2024 Start Time: 1300 End Time: 1405   Type of Therapy/Topic:  Group Therapy:  Emotion Regulation  Participation Level:  Did Not Attend   Mood: X  Description of Group:    The purpose of this group is to assist patients in learning to regulate negative emotions and experience positive emotions. Patients will be guided to discuss ways in which they have been vulnerable to their negative emotions. These vulnerabilities will be juxtaposed with experiences of positive emotions or situations, and patients challenged to use positive emotions to combat negative ones. Special emphasis will be placed on coping with negative emotions in conflict situations, and patients will process healthy conflict resolution skills.  Therapeutic Goals: Patient will identify two positive emotions or experiences to reflect on in order to balance out negative emotions:  Patient will label two or more emotions that they find the most difficult to experience:  Patient will be able to demonstrate positive conflict resolution skills through discussion or role plays:   Summary of Patient Progress:   Patient did not attend.     Therapeutic Modalities:   Cognitive Behavioral Therapy Feelings Identification Dialectical Behavioral Therapy   Alveta CHRISTELLA Kerns, LCSW

## 2024-05-16 NOTE — Group Note (Signed)
 Date:  05/16/2024 Time:  4:46 PM  Group Topic/Focus:  Goals Group:   The focus of this group is to help patients establish daily goals to achieve during treatment and discuss how the patient can incorporate goal setting into their daily lives to aide in recovery.  Participation Level:  Did Not Attend  Awilda Covin A Oluwadarasimi Redmon 05/16/2024, 4:46 PM

## 2024-05-16 NOTE — Group Note (Signed)
 Date:  05/16/2024 Time:  8:37 PM  Group Topic/Focus:  Orientation:   The focus of this group is to educate the patient on the purpose and policies of crisis stabilization and provide a format to answer questions about their admission.  The group details unit policies and expectations of patients while admitted.    Participation Level:  Did Not Attend  Participation Quality:  NONE  Affect:  NONE  Cognitive:  NONE  Insight: None  Engagement in Group:  NONE  Modes of Intervention:  NONE  Additional Comments:  NONE    Willey Due 05/16/2024, 8:37 PM

## 2024-05-16 NOTE — Progress Notes (Signed)
   05/16/24 0900  Psych Admission Type (Psych Patients Only)  Admission Status Voluntary  Psychosocial Assessment  Patient Complaints None  Eye Contact Brief  Facial Expression Flat  Affect Blunted  Speech Soft  Interaction Minimal  Motor Activity Slow  Appearance/Hygiene In scrubs  Behavior Characteristics Resistant to care (patient refuses scheduled Invega , stating can we skip it today?)  Mood Pleasant  Aggressive Behavior  Effect No apparent injury  Thought Process  Coherency WDL  Content WDL  Delusions None reported or observed  Perception WDL  Hallucination None reported or observed  Judgment Poor  Confusion None  Danger to Self  Current suicidal ideation? Denies  Self-Injurious Behavior No self-injurious ideation or behavior indicators observed or expressed   Agreement Not to Harm Self Yes  Description of Agreement Verbal  Danger to Others  Danger to Others None reported or observed

## 2024-05-16 NOTE — Plan of Care (Signed)
   Problem: Education: Goal: Knowledge of [facility] Education information/materials will improve Outcome: Progressing Goal: Emotional status will improve Outcome: Progressing Goal: Mental status will improve Outcome: Progressing Goal: Verbalization of understanding the information provided will improve Outcome: Progressing

## 2024-05-17 NOTE — Group Note (Signed)
 [facility] LCSW Group Therapy Note   Group Date: 05/17/2024 Start Time: 1300 End Time: 1350   Type of Therapy/Topic:  Group Therapy:  Emotion Regulation  Participation Level:  Did Not Attend    Description of Group:    The purpose of this group is to assist patients in learning to regulate negative emotions and experience positive emotions. Patients will be guided to discuss ways in which they have been vulnerable to their negative emotions. These vulnerabilities will be juxtaposed with experiences of positive emotions or situations, and patients challenged to use positive emotions to combat negative ones. Special emphasis will be placed on coping with negative emotions in conflict situations, and patients will process healthy conflict resolution skills.  Therapeutic Goals: Patient will identify two positive emotions or experiences to reflect on in order to balance out negative emotions:  Patient will label two or more emotions that they find the most difficult to experience:  Patient will be able to demonstrate positive conflict resolution skills through discussion or role plays:   Summary of Patient Progress: Patient did not attend group.    Therapeutic Modalities:   Cognitive Behavioral Therapy Feelings Identification Dialectical Behavioral Therapy   Nadara JONELLE Fam, LCSW

## 2024-05-17 NOTE — Progress Notes (Signed)
 [facility] MD Progress Note  05/17/2024 9:10 AM Lauren Poole  MRN:  [id number]  33 year old female who presented to the ED via EMS following a suicide attempt by overdose on 04/30/24. She reported ingesting approximately 30 Aleve pills. After ingestion, the patient did not experience vomiting or other acute physical symptoms. She reports that initially she wanted to not wake up, but later became fearful and contacted a friend, who encouraged her to seek care in the ED. The patient reports she has struggled with chronic suicidal ideation for about 1 year.  She states things have been hard but declines to elaborate on specific triggers.   Subjective:  Chart reviewed, case discussed in multidisciplinary meeting, patient seen during rounds.   10/1: On interview today, patient is noted to be laying in bed and engages minimally with the provider. She reports tolerating Seroquel  restart well without adverse effects.  Patient endorses passive SI without intent or plan.  She denies HI/plan and denies hallucinations.  Patient declines to participate in interview further.  9/30: On interview today, patient is noted to be laying in bed with a pillow covering her face.  She sits up to engage with provider.  She rates depression as a 2-3 out of 10 and anxiety as 4-5 out of 10 today.  She denies SI/HI/plan and denies hallucinations.  She is sleeping well and appetite is stable.  She states she does not like Invega  and refused dose today, stating it has made her feel more down than even.  She requests to discontinue Invega  for this reason.  She would like to restart Seroquel , though requests to start at a lower dose than what she was previously taking which was 200 mg, stating she felt tired with 200 mg dose.  Case discussed with attending physician and social work team.  Referral to be placed to APS.  9/29: On interview today, patient is found to be laying in bed and is minimally engaging with the provider.   She shows apathy, poor insight, and is unable to discuss her treatment goals.  Patient declined LAI.  She reports some emotional blunting with Invega  but otherwise is tolerating it well.  She states she initially found the medication beneficial in the first few days of taking it.  When asked about discharge planning, patient states she is not sure where she will go and states she does not care where she goes, commenting [place]  When asked about support system, patient states that she does not have any friends or family.  She denies SI/HI/plan and denies hallucinations.  Patient declines to participate further in the interview.  9/28: Patient is seen for follow-up they are alert and oriented on exam.  They continue to decline LAI and indicated they will continue with the oral medication.  They continue to have poor insight into the need for medication compliance and outpatient follow-up.  They deny current SI HI and AVH.  They do not require behavioral PRNs overnight.  They have been reclusive to room.  Engaged him in discharge planning discussing options such as shelters, mother's house, or friend's house and they are reaching out to be able today it appears they will likely need to go to the shelter though.  05/13/24: Per nursing patient declined LAI this morning.  On interview she indicates she does not take any longer.  She denies SI, HI, and AVH.  She reports she is agreeable to taking the oral 3 mg dose.  She demonstrates poor insight.  Discussed her past psych records of poor medication compliance and how long-acting injectable would remove the need for her to take medication every day.  She continues to decline despite previously agreeing.  She did not require behavioral PRNs overnight they are performing ADLs.  They voiced no concerns or complaints.  Continue with some paranoia and bizarre behavior we will continue to monitor.  05/12/24: Per follow-up they are alert and  oriented. They are feeling more like themself. Still some bizarre behavior discusses that they want to fast for 7 days and are unable to explain why. Then begins discussing their current hunger. They Deny SI/HI/AVH. She does laugh inappropriately at times. Notes no dizziness this morning. Risk, benefit, and adverse effects of invega  sustenna are reviewed and pt consents to LAI. They are performing ADLS. They voice no concerns or complaints. No behavioral PRNS overnight. Act referral is being placed.   05/11/24: Per follow-up this morning they are alert and oriented.  They are pleasant and cooperative.  They continue to feel the Invega  is effective and they feel her thought processes been more linear and their mood has been more stable.  They continue to note some passive SI they deny plan or intent.  They had some dizziness this morning they are unclear on the timeframe with reference to getting the higher dose of Invega  today we will continue to monitor.  She does not appear to be internally preoccupied nor she observed to be responding to internal stimuli.  She denies HI.  Given multiple recent hospitalizations we will continue to monitor work towards setting patient up with ACT team and progress towards LAI in the coming days.  05/10/24: Patient seen for follow up. They are alert and oriented. They are pleasant and cooperative on exam. They feel that the invega  is working well and are agreeable to continued titration. They noted some passive si today without plan or intent. Disussed medication options for depression and patient indicates she is only willing to take the Invega  at this time but that she is open to an LAI. She denies HI and AVH. Discussed ACTT services given the multiple recent hospitalizations. Sleep and appetite are stable. Had an argument with another pt during group yesterday but feels that it was because the pt was judging them. No behavioral PRNS overnight.   05/09/24: Patient seen for  follow up. They are alert and oriented. They report they are not agreeable to continuing abilify . They are not agreeable to ongoing lab test required for Lithium , will discontinue both. Discussed invega  and LAI, patient is agreeable to trial of invega  with openess to transitioning to LAI. She denies SI/HI/AVH. Notes improving sleep and stable appetite.   05/08/2024: On interview today patient is noted to be sitting up in bed.  She is more engaged in interview today.  She reports she has not started Abilify  yet but is planning to take first dose this evening.  She has started lithium  and is tolerating this well.  She endorses symptoms of depression but denies persistent anxiety.  She denies current SI/HI/plan and denies hallucinations.  She notes most recent episode of passive SI was yesterday.  She does not voice any concerns or complaints today.  She discusses desire to do something productive with her life.  05/07/2024: Per nursing report patient did not come out and remained mostly to her room.  This Clinical research associate talked with the patient and her room.  Patient reports that she got Seroquel  yesterday  and still thinks sleepy because of that.  She reports that she got lithium  today in the morning for the first time and would get Abilify  tonight.  Patient continues to report having suicidal ideations.  Reports that the last time she had suicidal ideations was yesterday in the evening time and she just woke up in the morning.  Still feeling depressed and rates her depression as 5 out of 10.  Patient reports that her normal self is when she rates her depression as 2 out of 10.  Continues to report that she has thoughts of shooting herself if she would have a gun.  But she does not have any access to guns.    05/06/2024:   per staff report patient has refused to take the Seroquel  at night yesterday.  Patient is still coming out and staying in her home.  Patient on interview reports that she came suicide attempt and  reports that she had 1 previous suicide attempt this year.  Patient continues to report that she is depressed.  Patient reports that it is end of road for her.  Patient reports that she does not have access to gun but if she has done she would just get it over with.  Patient remained vague but reports that she has ongoing financial, family, life stressors.  Patient reports history consistent with bipolar 1   With manic episode lasting for 1 month but reports that these days she is not  having manic symptoms and mostly getting depressed.   9/19: On interview today patient is noted to be seated up in bed looking towards the window.  She is more engaged in interview today than she has been in recent days.   She was compliant with Seroquel  dose last night, and states she feels that it is working.  She reports tolerating medication well without adverse effects.  She is sleeping well and reports appetite as normal.  She denies SI/HI/plan and denies hallucinations.  She rates depression as a 4 out of 10 and anxiety as a 0 out of 10 today.  She does not voice any concerns or complaints today.  9/18: On interview today patient is noted to be alert and laying in bed.  She declines to communicate with provider verbally.  Patient responds to questions during interview with a head nod or head shake.  Patient declined medication last night per nursing report.  She denies SI/HI/plan and denies hallucinations via shaking her head to indicate 'no' when asked.  She declined to answer questions regarding sleep and appetite.  When asked if tolerating medication patient nods head to indicate 'yes'.  Patient declined to participate further in interview.  9/17: On interview today patient is noted to be laying in bed with hand covering her face.  She continues to be withdrawn and minimizes symptoms.  She denies current SI/HI/plan and denies hallucinations.  She states she is tolerating initiation of Seroquel  well.  Patient  declines to answer further questions.  9/16: On interview, patient is noted to be laying in bed.  Per nursing report, she has primarily been isolated to her room since her arrival on the unit.  She endorses passive SI without current intent or plan.  She denies HI/plan and denies hallucinations.  She reports she is sleeping and eating well.  Interviewer discussed treatment options with patient to include medication.  Patient states she prefers to be restarted on Seroquel , which she has taken in the past with good response.  Patient does  not voice any other complaints or concerns today.   Sleep: Fair  Appetite: Fair  Past Psychiatric History: see h&P Family History:  Family History  Problem Relation Age of Onset   Schizophrenia Sister    Social History:  Social History   Substance and Sexual Activity  Alcohol Use Yes   Alcohol/week: 4.0 standard drinks of alcohol   Types: 4 Glasses of wine per week     Social History   Substance and Sexual Activity  Drug Use Not Currently   Types: Marijuana    Social History   Socioeconomic History   Marital status: Single    Spouse name: Not on file   Number of children: Not on file   Years of education: Not on file   Highest education level: Not on file  Occupational History   Not on file  Tobacco Use   Smoking status: Every Day    Current packs/day: 0.50    Average packs/day: 0.5 packs/day for 6.6 years (3.3 ttl pk-yrs)    Types: Cigarettes    Start date: 10/2017   Smokeless tobacco: Never  Vaping Use   Vaping status: Never Used  Substance and Sexual Activity   Alcohol use: Yes    Alcohol/week: 4.0 standard drinks of alcohol    Types: 4 Glasses of wine per week   Drug use: Not Currently    Types: Marijuana   Sexual activity: Yes    Birth control/protection: None  Other Topics Concern   Not on file  Social History Narrative   Not on file   Social Drivers of Health   Financial Resource Strain: Not on file  Food  Insecurity: Food Insecurity Present (04/30/2024)   Hunger Vital Sign    Worried About Running Out of Food in the Last Year: Sometimes true    Ran Out of Food in the Last Year: Sometimes true  Transportation Needs: Unmet Transportation Needs (04/30/2024)   PRAPARE - Administrator, Civil Service (Medical): Yes    Lack of Transportation (Non-Medical): Yes  Physical Activity: Not on file  Stress: Not on file  Social Connections: Not on file   Past Medical History:  Past Medical History:  Diagnosis Date   Bipolar 1 disorder (HCC)    Chlamydia    Gonorrhea     Past Surgical History:  Procedure Laterality Date   NO PAST SURGERIES      Current Medications: Current Facility-Administered Medications  Medication Dose Route Frequency Provider Last Rate Last Admin   acetaminophen  (TYLENOL ) tablet 650 mg  650 mg Oral Q6H PRN Motley-Mangrum, Jadeka A, PMHNP       alum & mag hydroxide-simeth (MAALOX/MYLANTA) 200-200-20 MG/5ML suspension 30 mL  30 mL Oral Q4H PRN Motley-Mangrum, Jadeka A, PMHNP       haloperidol  (HALDOL ) tablet 5 mg  5 mg Oral TID PRN Motley-Mangrum, Jadeka A, PMHNP       And   diphenhydrAMINE  (BENADRYL ) capsule 50 mg  50 mg Oral TID PRN Motley-Mangrum, Jadeka A, PMHNP       haloperidol  lactate (HALDOL ) injection 5 mg  5 mg Intramuscular TID PRN Motley-Mangrum, Jadeka A, PMHNP       And   diphenhydrAMINE  (BENADRYL ) injection 50 mg  50 mg Intramuscular TID PRN Motley-Mangrum, Jadeka A, PMHNP       And   LORazepam  (ATIVAN ) injection 2 mg  2 mg Intramuscular TID PRN Motley-Mangrum, Jadeka A, PMHNP       haloperidol  lactate (HALDOL ) injection 10 mg  10 mg Intramuscular TID PRN Motley-Mangrum, Jadeka A, PMHNP   10 mg at 05/17/24 1233   And   diphenhydrAMINE  (BENADRYL ) injection 50 mg  50 mg Intramuscular TID PRN Motley-Mangrum, Jadeka A, PMHNP   50 mg at 05/17/24 1233   And   LORazepam  (ATIVAN ) injection 2 mg  2 mg Intramuscular TID PRN Motley-Mangrum, Jadeka A, PMHNP    2 mg at 05/17/24 1233   magnesium  hydroxide (MILK OF MAGNESIA) suspension 30 mL  30 mL Oral Daily PRN Motley-Mangrum, Jadeka A, PMHNP       QUEtiapine  (SEROQUEL  XR) 24 hr tablet 100 mg  100 mg Oral QHS Lasheena Frieze L, PA-C   100 mg at 05/17/24 2121   traZODone  (DESYREL ) tablet 50 mg  50 mg Oral QHS PRN Motley-Mangrum, Jadeka A, PMHNP   50 mg at 05/14/24 2105    Lab Results:  No results found for this or any previous visit (from the past 48 hours).   Blood Alcohol level:  Lab Results  Component Value Date   [facility] <15 04/30/2024   ETH <15 04/09/2024    Metabolic Disorder Labs: Lab Results  Component Value Date   HGBA1C 5.1 10/31/2023   MPG 99.67 10/31/2023   No results found for: PROLACTIN Lab Results  Component Value Date   CHOL 136 10/31/2023   TRIG 75 10/31/2023   HDL 37 (L) 10/31/2023   CHOLHDL 3.7 10/31/2023   VLDL 15 10/31/2023   LDLCALC 84 10/31/2023    Physical Findings: AIMS:  , ,  ,  ,    CIWA:    COWS:      Musculoskeletal: Strength & Muscle Tone: within normal limits Gait & Station: normal Assets  Assets: Communication Skills  Mental Status Exam:  General Appearance: Casual Eye contact: Minimal Speech: Normal rate Speech volume: Decreased Mood: Depressed Affect: Flat Thought process: Linear Descriptions of associations: Intact Orientation: Full (time, place and person) he Thought content: WDL Hallucinations: None Ideas of reference: None Suicidal thoughts: Yes, passive, without intent, without plan Homicidal thoughts: No Memory: Immediate fair, recent fair, remote fair Judgment: Poor Insight: Poor Concentration: Fair Attention span: Fair Recall: [employer] of knowledge: Fair Language: Fair Psychomotor activity: Normal  Physical Exam: Physical Exam Vitals and nursing note reviewed.  HENT:     Head: Atraumatic.  Eyes:     Extraocular Movements: Extraocular movements intact.  Pulmonary:     Effort: Pulmonary effort is normal.   Neurological:     Mental Status: She is alert and oriented to person, place, and time.    ROS Blood pressure 125/82, pulse 82, temperature (!) 97.2 F (36.2 C), resp. rate 16, height 5' 4 (1.626 m), weight 109.3 kg, SpO2 100%. Body mass index is 41.37 kg/m.  Diagnosis: Principal Problem:   Bipolar 1 disorder (HCC)   PLAN: Safety and Monitoring:  -- Voluntary admission to inpatient psychiatric unit for safety, stabilization and treatment  -- Daily contact with patient to assess and evaluate symptoms and progress in treatment  -- Patient's case to be discussed in multi-disciplinary team meeting  -- Observation Level : q15 minute checks  -- Vital signs:  q12 hours  -- Precautions: suicide, elopement, and assault -- Encouraged patient to participate in unit milieu and in scheduled group therapies  2. Psychiatric Diagnoses and Treatment:  Bipolar disorder  Reviewed case with attending Dr. Donnelly who was agreeable with proceeding with LAI. Reviewed dosing with pharmacy who agreed.  Patient subsequently declined LAI. D/C lithium  300 mg b.I.d.  and Abilify  5 mg at bedtime.  Labs ordered for thyroid  panel and vitamin D .  Discontinued Invega  per patient request Continue Seroquel  XR 100 mg nightly with plan to titrate              The risks/benefits/side-effects/alternatives to this medication were discussed in detail with the patient and time was given for questions. The patient consents to medication trial. -- Metabolic profile and EKG monitoring obtained while on an atypical antipsychotic  -- Encouraged patient to participate in unit milieu and in scheduled group therapies                    3. Medical Issues Being Addressed:  No medical issues identified at this time.   4. Discharge Planning:  Patient to be referred for ACT services.    -- Social work and case management to assist with discharge planning and identification of hospital follow-up needs prior to discharge  --  Estimated LOS: 1-2 weeks  [employer], PA-C 05/18/2024, 9:10 AM

## 2024-05-17 NOTE — BH IP Treatment Plan (Signed)
 Interdisciplinary Treatment and Diagnostic Plan Update  05/17/2024 Time of Session: 1400 Lauren Poole MRN: [id number]  Principal Diagnosis: Bipolar 1 disorder (HCC)  Secondary Diagnoses: Principal Problem:   Bipolar 1 disorder (HCC)   Current Medications:  Current Facility-Administered Medications  Medication Dose Route Frequency Provider Last Rate Last Admin   acetaminophen  (TYLENOL ) tablet 650 mg  650 mg Oral Q6H PRN Motley-Mangrum, Jadeka A, PMHNP       alum & mag hydroxide-simeth (MAALOX/MYLANTA) 200-200-20 MG/5ML suspension 30 mL  30 mL Oral Q4H PRN Motley-Mangrum, Jadeka A, PMHNP       haloperidol  (HALDOL ) tablet 5 mg  5 mg Oral TID PRN Motley-Mangrum, Jadeka A, PMHNP       And   diphenhydrAMINE  (BENADRYL ) capsule 50 mg  50 mg Oral TID PRN Motley-Mangrum, Jadeka A, PMHNP       haloperidol  lactate (HALDOL ) injection 5 mg  5 mg Intramuscular TID PRN Motley-Mangrum, Jadeka A, PMHNP       And   diphenhydrAMINE  (BENADRYL ) injection 50 mg  50 mg Intramuscular TID PRN Motley-Mangrum, Jadeka A, PMHNP       And   LORazepam  (ATIVAN ) injection 2 mg  2 mg Intramuscular TID PRN Motley-Mangrum, Jadeka A, PMHNP       haloperidol  lactate (HALDOL ) injection 10 mg  10 mg Intramuscular TID PRN Motley-Mangrum, Jadeka A, PMHNP   10 mg at 05/17/24 1233   And   diphenhydrAMINE  (BENADRYL ) injection 50 mg  50 mg Intramuscular TID PRN Motley-Mangrum, Jadeka A, PMHNP   50 mg at 05/17/24 1233   And   LORazepam  (ATIVAN ) injection 2 mg  2 mg Intramuscular TID PRN Motley-Mangrum, Jadeka A, PMHNP   2 mg at 05/17/24 1233   magnesium  hydroxide (MILK OF MAGNESIA) suspension 30 mL  30 mL Oral Daily PRN Motley-Mangrum, Jadeka A, PMHNP       QUEtiapine  (SEROQUEL  XR) 24 hr tablet 100 mg  100 mg Oral QHS Hunter, Crystal L, PA-C   100 mg at 05/16/24 2117   traZODone  (DESYREL ) tablet 50 mg  50 mg Oral QHS PRN Motley-Mangrum, Jadeka A, PMHNP   50 mg at 05/14/24 2105   PTA Medications: Medications Prior to  Admission  Medication Sig Dispense Refill Last Dose/Taking   cholecalciferol  (CHOLECALCIFEROL ) 25 MCG tablet Take 1 tablet (1,000 Units total) by mouth daily. (Patient taking differently: Take 1,000 Units by mouth every 3 (three) days.) 30 tablet 0    naproxen sodium (ALEVE) 220 MG tablet Take 440 mg by mouth daily as needed (pain).      traZODone  (DESYREL ) 100 MG tablet Take 1 tablet (100 mg total) by mouth at bedtime. (Patient not taking: Reported on 04/30/2024) 30 tablet 0     Patient Stressors: Financial difficulties    Patient Strengths: Manufacturing systems engineer   Treatment Modalities: Medication Management, Group therapy, Case management,  1 to 1 session with clinician, Psychoeducation, Recreational therapy.   Physician Treatment Plan for Primary Diagnosis: Bipolar 1 disorder (HCC) Long Term Goal(s): Improvement in symptoms so as ready for discharge   Short Term Goals: Ability to identify changes in lifestyle to reduce recurrence of condition will improve Ability to verbalize feelings will improve Ability to disclose and discuss suicidal ideas Ability to identify and develop effective coping behaviors will improve  Medication Management: Evaluate patient's response, side effects, and tolerance of medication regimen.  Therapeutic Interventions: 1 to 1 sessions, Unit Group sessions and Medication administration.  Evaluation of Outcomes: Not Progressing  Physician Treatment Plan for Secondary Diagnosis: Principal  Problem:   Bipolar 1 disorder (HCC)  Long Term Goal(s): Improvement in symptoms so as ready for discharge   Short Term Goals: Ability to identify changes in lifestyle to reduce recurrence of condition will improve Ability to verbalize feelings will improve Ability to disclose and discuss suicidal ideas Ability to identify and develop effective coping behaviors will improve     Medication Management: Evaluate patient's response, side effects, and tolerance of medication  regimen.  Therapeutic Interventions: 1 to 1 sessions, Unit Group sessions and Medication administration.  Evaluation of Outcomes: Not Progressing   RN Treatment Plan for Primary Diagnosis: Bipolar 1 disorder (HCC) Long Term Goal(s): Knowledge of disease and therapeutic regimen to maintain health will improve  Short Term Goals: Ability to remain free from injury will improve, Ability to verbalize frustration and anger appropriately will improve, Ability to demonstrate self-control, Ability to participate in decision making will improve, Ability to verbalize feelings will improve, Ability to disclose and discuss suicidal ideas, Ability to identify and develop effective coping behaviors will improve, and Compliance with prescribed medications will improve  Medication Management: RN will administer medications as ordered by provider, will assess and evaluate patient's response and provide education to patient for prescribed medication. RN will report any adverse and/or side effects to prescribing provider.  Therapeutic Interventions: 1 on 1 counseling sessions, Psychoeducation, Medication administration, Evaluate responses to treatment, Monitor vital signs and CBGs as ordered, Perform/monitor CIWA, COWS, AIMS and Fall Risk screenings as ordered, Perform wound care treatments as ordered.  Evaluation of Outcomes: Not Progressing   LCSW Treatment Plan for Primary Diagnosis: Bipolar 1 disorder (HCC) Long Term Goal(s): Safe transition to appropriate next level of care at discharge, Engage patient in therapeutic group addressing interpersonal concerns.  Short Term Goals: Engage patient in aftercare planning with referrals and resources, Increase social support, Increase ability to appropriately verbalize feelings, Increase emotional regulation, Facilitate acceptance of mental health diagnosis and concerns, Facilitate patient progression through stages of change regarding substance use diagnoses and  concerns, Identify triggers associated with mental health/substance abuse issues, and Increase skills for wellness and recovery  Therapeutic Interventions: Assess for all discharge needs, 1 to 1 time with Social worker, Explore available resources and support systems, Assess for adequacy in community support network, Educate family and significant other(s) on suicide prevention, Complete Psychosocial Assessment, Interpersonal group therapy.  Evaluation of Outcomes: Not Progressing   Progress in Treatment: Attending groups: No. Participating in groups: No. Taking medication as prescribed: No. Toleration medication: No. Family/Significant other contact made: No, will contact:  if given permission.  Patient understands diagnosis: Yes. Discussing patient identified problems/goals with staff: Yes. Medical problems stabilized or resolved: Yes. Denies suicidal/homicidal ideation: Yes. Issues/concerns per patient self-inventory: No. Other: none.   New problem(s) identified: No, Describe:  none identified. Update 05/07/24: No changes at this time. Update 05/12/24: No changes at this time. Update 05/17/24: No changes at this time.    New Short Term/Long Term Goal(s): elimination of symptoms of psychosis, medication management for mood stabilization; elimination of SI thoughts; development of comprehensive mental wellness/sobriety plan. Update 05/07/24: No changes at this time. Update 05/12/24: No changes at this time. Update 05/17/24: No changes at this time.   Patient Goals: To go home.   Update 05/07/24: No changes at this time. Update 05/12/24: No changes at this time. Update 05/17/24: No changes at this time.   Discharge Plan or Barriers: CSW will assist pt with development of an appropriate aftercare/discharge plan.   Update 05/07/24: No  changes at this time. Update 05/12/24: No changes at this time. Update 05/17/24: Pt has stopped taking her medication and stopped attending group. She had to receive  agitation protocol today due to aggressive behavior on unit.     Reason for Continuation of Hospitalization: Aggression Medication stabilization   Estimated Length of Stay: 1-7 days  Update 05/07/24: No changes at this time. Update 05/12/24: TBD. Update 05/17/24: TBD  Last 3 [place] Suicide Severity Risk Score: Flowsheet Row Admission (Current) from 04/30/2024 in [facility] INPATIENT BEHAVIORAL MEDICINE Most recent reading at 04/30/2024  6:00 PM ED from 04/30/2024 in [facility] Most recent reading at 04/30/2024  1:04 AM Admission (Discharged) from 04/09/2024 in [facility] INPATIENT BE[facility] MEDICINE Most recent reading at 04/09/2024  6:00 PM  C-SSRS RISK CATEGORY High Risk High Risk Low Risk    Last PHQ 2/9 Scores:     No data to display          Scribe for Treatment Team: Nadara JONELLE Fam, LCSW 05/17/2024 3:07 PM

## 2024-05-17 NOTE — Plan of Care (Signed)
  Problem: Education: Goal: Emotional status will improve Outcome: Not Progressing Goal: Verbalization of understanding the information provided will improve Outcome: Not Progressing   Problem: Activity: Goal: Interest or engagement in activities will improve Outcome: Not Progressing   Problem: Coping: Goal: Ability to verbalize frustrations and anger appropriately will improve Outcome: Not Progressing   Problem: Health Behavior/Discharge Planning: Goal: Identification of resources available to assist in meeting health care needs will improve Outcome: Not Progressing

## 2024-05-17 NOTE — Progress Notes (Signed)
   05/17/24 1100  Psych Admission Type (Psych Patients Only)  Admission Status Voluntary  Psychosocial Assessment  Patient Complaints None  Eye Contact Brief  Facial Expression Masked  Affect Inconsistent with thought content  Speech Soft  Interaction No initiation  Motor Activity Slow  Appearance/Hygiene In scrubs  Behavior Characteristics Resistant to care  Mood Pleasant  Aggressive Behavior  Effect No apparent injury  Thought Process  Coherency WDL  Content Preoccupation  Delusions None reported or observed  Perception WDL  Hallucination None reported or observed  Judgment Poor  Confusion None  Danger to Self  Current suicidal ideation? Denies  Agreement Not to Harm Self Yes  Description of Agreement verbal  Danger to Others  Danger to Others None reported or observed   Patient refused to answer any questions this morning.Just talking to staff which doesn't make any sense.After lunch patient talking loud and making gestures at the hallway and yelled at staff. Patient refused medications first. But with security patient accepted IM injection.Support and encouragement given. Patient sleeping at this time.

## 2024-05-17 NOTE — [facility] Counselor (Addendum)
 Addendum CSW received notification from [facility] that case has been accepted for investigation.   Nadara SAUNDERS. Chaim, MSW, LCSW, LCAS 05/17/2024 4:02 PM  APS reported submitted to [facility]. CSW was informed that they would give him a call back to let him know if they are going to investigate.   Nadara SAUNDERS. Chaim, MSW, LCSW, LCAS 05/17/2024 3:16 PM

## 2024-05-17 NOTE — Plan of Care (Signed)
   Problem: Education: Goal: Emotional status will improve Outcome: Not Progressing Goal: Mental status will improve Outcome: Not Progressing

## 2024-05-18 MED ORDER — QUETIAPINE FUMARATE ER 200 MG PO TB24
200.0000 mg | ORAL_TABLET | Freq: Every day | ORAL | Status: DC
  Administered 2024-05-18 – 2024-05-20 (×3): 200 mg via ORAL
  Filled 2024-05-18 (×3): qty 1

## 2024-05-18 NOTE — Plan of Care (Signed)
  Problem: Education: Goal: Emotional status will improve Outcome: Progressing   Problem: Education: Goal: Mental status will improve Outcome: Progressing   Problem: Coping: Goal: Ability to demonstrate self-control will improve Outcome: Progressing   Problem: Safety: Goal: Periods of time without injury will increase Outcome: Progressing   Problem: Physical Regulation: Goal: Ability to maintain clinical measurements within normal limits will improve Outcome: Progressing

## 2024-05-18 NOTE — Progress Notes (Signed)
   05/18/24 0815  Psych Admission Type (Psych Patients Only)  Admission Status Voluntary  Psychosocial Assessment  Patient Complaints None  Eye Contact Brief  Facial Expression Masked  Affect Inconsistent with thought content  Speech Soft  Interaction No initiation  Motor Activity Slow  Appearance/Hygiene In scrubs  Behavior Characteristics Resistant to care  Mood Pleasant  Aggressive Behavior  Effect No apparent injury  Thought Process  Coherency WDL  Content Preoccupation  Delusions None reported or observed  Perception WDL  Hallucination None reported or observed  Judgment Poor  Confusion WDL  Danger to Self  Current suicidal ideation? Denies  Self-Injurious Behavior No self-injurious ideation or behavior indicators observed or expressed   Agreement Not to Harm Self Yes  Description of Agreement Verbal  Danger to Others  Danger to Others None reported or observed

## 2024-05-18 NOTE — Progress Notes (Signed)
 [facility] MD Progress Note  05/18/2024 5:58 PM Lauren Poole  MRN:  [id number]  33 year old female who presented to the ED via EMS following a suicide attempt by overdose on 04/30/24. She reported ingesting approximately 30 Aleve pills. After ingestion, the patient did not experience vomiting or other acute physical symptoms. She reports that initially she wanted to not wake up, but later became fearful and contacted a friend, who encouraged her to seek care in the ED. The patient reports she has struggled with chronic suicidal ideation for about 1 year.  She states things have been hard but declines to elaborate on specific triggers.   Subjective:  Chart reviewed, case discussed in multidisciplinary meeting, patient seen during rounds.   10/2: On interview today, patient noted to be laying in bed, she continues to be minimally engaging during interview and is noted to have a flat affect.  She continues to lack insight into her mental health.  She required behavioral PRN yesterday after she was observed to be making gestures in the hallway and yelling at staff.  She rates depression as 3 out of 10 and anxiety at 0 out of 10 today.  She denies SI/HI/plan and denies hallucinations.  She is agreeable to Seroquel  dose increase.  10/1: On interview today, patient is noted to be laying in bed and engages minimally with the provider. She reports tolerating Seroquel  restart well without adverse effects.  Patient endorses passive SI without intent or plan.  She denies HI/plan and denies hallucinations.  Patient declines to participate in interview further.  9/30: On interview today, patient is noted to be laying in bed with a pillow covering her face.  She sits up to engage with provider.  She rates depression as a 2-3 out of 10 and anxiety as 4-5 out of 10 today.  She denies SI/HI/plan and denies hallucinations.  She is sleeping well and appetite is stable.  She states she does not like Invega  and refused  dose today, stating it has made her feel more down than even.  She requests to discontinue Invega  for this reason.  She would like to restart Seroquel , though requests to start at a lower dose than what she was previously taking which was 200 mg, stating she felt tired with 200 mg dose.  Case discussed with attending physician and social work team.  Referral to be placed to APS.  9/29: On interview today, patient is found to be laying in bed and is minimally engaging with the provider.  She shows apathy, poor insight, and is unable to discuss her treatment goals.  Patient declined LAI.  She reports some emotional blunting with Invega  but otherwise is tolerating it well.  She states she initially found the medication beneficial in the first few days of taking it.  When asked about discharge planning, patient states she is not sure where she will go and states she does not care where she goes, commenting [place]  When asked about support system, patient states that she does not have any friends or family.  She denies SI/HI/plan and denies hallucinations.  Patient declines to participate further in the interview.  9/28: Patient is seen for follow-up they are alert and oriented on exam.  They continue to decline LAI and indicated they will continue with the oral medication.  They continue to have poor insight into the need for medication compliance and outpatient follow-up.  They deny current SI HI and AVH.  They do not require behavioral PRNs overnight.  They have been reclusive to room.  Engaged him in discharge planning discussing options such as shelters, mother's house, or friend's house and they are reaching out to be able today it appears they will likely need to go to the shelter though.  05/13/24: Per nursing patient declined LAI this morning.  On interview she indicates she does not take any longer.  She denies SI, HI, and AVH.  She reports she is agreeable to taking the  oral 3 mg dose.  She demonstrates poor insight.  Discussed her past psych records of poor medication compliance and how long-acting injectable would remove the need for her to take medication every day.  She continues to decline despite previously agreeing.  She did not require behavioral PRNs overnight they are performing ADLs.  They voiced no concerns or complaints.  Continue with some paranoia and bizarre behavior we will continue to monitor.  05/12/24: Per follow-up they are alert and oriented. They are feeling more like themself. Still some bizarre behavior discusses that they want to fast for 7 days and are unable to explain why. Then begins discussing their current hunger. They Deny SI/HI/AVH. She does laugh inappropriately at times. Notes no dizziness this morning. Risk, benefit, and adverse effects of invega  sustenna are reviewed and pt consents to LAI. They are performing ADLS. They voice no concerns or complaints. No behavioral PRNS overnight. Act referral is being placed.   05/11/24: Per follow-up this morning they are alert and oriented.  They are pleasant and cooperative.  They continue to feel the Invega  is effective and they feel her thought processes been more linear and their mood has been more stable.  They continue to note some passive SI they deny plan or intent.  They had some dizziness this morning they are unclear on the timeframe with reference to getting the higher dose of Invega  today we will continue to monitor.  She does not appear to be internally preoccupied nor she observed to be responding to internal stimuli.  She denies HI.  Given multiple recent hospitalizations we will continue to monitor work towards setting patient up with ACT team and progress towards LAI in the coming days.  05/10/24: Patient seen for follow up. They are alert and oriented. They are pleasant and cooperative on exam. They feel that the invega  is working well and are agreeable to continued titration. They  noted some passive si today without plan or intent. Disussed medication options for depression and patient indicates she is only willing to take the Invega  at this time but that she is open to an LAI. She denies HI and AVH. Discussed ACTT services given the multiple recent hospitalizations. Sleep and appetite are stable. Had an argument with another pt during group yesterday but feels that it was because the pt was judging them. No behavioral PRNS overnight.   05/09/24: Patient seen for follow up. They are alert and oriented. They report they are not agreeable to continuing abilify . They are not agreeable to ongoing lab test required for Lithium , will discontinue both. Discussed invega  and LAI, patient is agreeable to trial of invega  with openess to transitioning to LAI. She denies SI/HI/AVH. Notes improving sleep and stable appetite.   05/08/2024: On interview today patient is noted to be sitting up in bed.  She is more engaged in interview today.  She reports she has not started Abilify  yet but is planning to take first dose this evening.  She  has started lithium  and is tolerating this well.  She endorses symptoms of depression but denies persistent anxiety.  She denies current SI/HI/plan and denies hallucinations.  She notes most recent episode of passive SI was yesterday.  She does not voice any concerns or complaints today.  She discusses desire to do something productive with her life.  05/07/2024: Per nursing report patient did not come out and remained mostly to her room.  This Clinical research associate talked with the patient and her room.  Patient reports that she got Seroquel  yesterday and still thinks sleepy because of that.  She reports that she got lithium  today in the morning for the first time and would get Abilify  tonight.  Patient continues to report having suicidal ideations.  Reports that the last time she had suicidal ideations was yesterday in the evening time and she just woke up in the morning.  Still  feeling depressed and rates her depression as 5 out of 10.  Patient reports that her normal self is when she rates her depression as 2 out of 10.  Continues to report that she has thoughts of shooting herself if she would have a gun.  But she does not have any access to guns.    05/06/2024:   per staff report patient has refused to take the Seroquel  at night yesterday.  Patient is still coming out and staying in her home.  Patient on interview reports that she came suicide attempt and reports that she had 1 previous suicide attempt this year.  Patient continues to report that she is depressed.  Patient reports that it is end of road for her.  Patient reports that she does not have access to gun but if she has done she would just get it over with.  Patient remained vague but reports that she has ongoing financial, family, life stressors.  Patient reports history consistent with bipolar 1   With manic episode lasting for 1 month but reports that these days she is not  having manic symptoms and mostly getting depressed.   9/19: On interview today patient is noted to be seated up in bed looking towards the window.  She is more engaged in interview today than she has been in recent days.   She was compliant with Seroquel  dose last night, and states she feels that it is working.  She reports tolerating medication well without adverse effects.  She is sleeping well and reports appetite as normal.  She denies SI/HI/plan and denies hallucinations.  She rates depression as a 4 out of 10 and anxiety as a 0 out of 10 today.  She does not voice any concerns or complaints today.  9/18: On interview today patient is noted to be alert and laying in bed.  She declines to communicate with provider verbally.  Patient responds to questions during interview with a head nod or head shake.  Patient declined medication last night per nursing report.  She denies SI/HI/plan and denies hallucinations via shaking her head to indicate  'no' when asked.  She declined to answer questions regarding sleep and appetite.  When asked if tolerating medication patient nods head to indicate 'yes'.  Patient declined to participate further in interview.  9/17: On interview today patient is noted to be laying in bed with hand covering her face.  She continues to be withdrawn and minimizes symptoms.  She denies current SI/HI/plan and denies hallucinations.  She states she is tolerating initiation of Seroquel  well.  Patient declines to  answer further questions.  9/16: On interview, patient is noted to be laying in bed.  Per nursing report, she has primarily been isolated to her room since her arrival on the unit.  She endorses passive SI without current intent or plan.  She denies HI/plan and denies hallucinations.  She reports she is sleeping and eating well.  Interviewer discussed treatment options with patient to include medication.  Patient states she prefers to be restarted on Seroquel , which she has taken in the past with good response.  Patient does not voice any other complaints or concerns today.   Sleep: Fair  Appetite: Fair  Past Psychiatric History: see h&P Family History:  Family History  Problem Relation Age of Onset   Schizophrenia Sister    Social History:  Social History   Substance and Sexual Activity  Alcohol Use Yes   Alcohol/week: 4.0 standard drinks of alcohol   Types: 4 Glasses of wine per week     Social History   Substance and Sexual Activity  Drug Use Not Currently   Types: Marijuana    Social History   Socioeconomic History   Marital status: Single    Spouse name: Not on file   Number of children: Not on file   Years of education: Not on file   Highest education level: Not on file  Occupational History   Not on file  Tobacco Use   Smoking status: Every Day    Current packs/day: 0.50    Average packs/day: 0.5 packs/day for 6.6 years (3.3 ttl pk-yrs)    Types: Cigarettes    Start date:  10/2017   Smokeless tobacco: Never  Vaping Use   Vaping status: Never Used  Substance and Sexual Activity   Alcohol use: Yes    Alcohol/week: 4.0 standard drinks of alcohol    Types: 4 Glasses of wine per week   Drug use: Not Currently    Types: Marijuana   Sexual activity: Yes    Birth control/protection: None  Other Topics Concern   Not on file  Social History Narrative   Not on file   Social Drivers of Health   Financial Resource Strain: Not on file  Food Insecurity: Food Insecurity Present (04/30/2024)   Hunger Vital Sign    Worried About Running Out of Food in the Last Year: Sometimes true    Ran Out of Food in the Last Year: Sometimes true  Transportation Needs: Unmet Transportation Needs (04/30/2024)   PRAPARE - Administrator, Civil Service (Medical): Yes    Lack of Transportation (Non-Medical): Yes  Physical Activity: Not on file  Stress: Not on file  Social Connections: Not on file   Past Medical History:  Past Medical History:  Diagnosis Date   Bipolar 1 disorder (HCC)    Chlamydia    Gonorrhea     Past Surgical History:  Procedure Laterality Date   NO PAST SURGERIES      Current Medications: Current Facility-Administered Medications  Medication Dose Route Frequency Provider Last Rate Last Admin   acetaminophen  (TYLENOL ) tablet 650 mg  650 mg Oral Q6H PRN Motley-Mangrum, Jadeka A, PMHNP       alum & mag hydroxide-simeth (MAALOX/MYLANTA) 200-200-20 MG/5ML suspension 30 mL  30 mL Oral Q4H PRN Motley-Mangrum, Jadeka A, PMHNP       haloperidol  (HALDOL ) tablet 5 mg  5 mg Oral TID PRN Motley-Mangrum, Jadeka A, PMHNP       And   diphenhydrAMINE  (BENADRYL ) capsule 50 mg  50 mg Oral TID PRN Motley-Mangrum, Jadeka A, PMHNP       haloperidol  lactate (HALDOL ) injection 5 mg  5 mg Intramuscular TID PRN Motley-Mangrum, Jadeka A, PMHNP       And   diphenhydrAMINE  (BENADRYL ) injection 50 mg  50 mg Intramuscular TID PRN Motley-Mangrum, Jadeka A, PMHNP        And   LORazepam  (ATIVAN ) injection 2 mg  2 mg Intramuscular TID PRN Motley-Mangrum, Jadeka A, PMHNP       haloperidol  lactate (HALDOL ) injection 10 mg  10 mg Intramuscular TID PRN Motley-Mangrum, Jadeka A, PMHNP   10 mg at 05/17/24 1233   And   diphenhydrAMINE  (BENADRYL ) injection 50 mg  50 mg Intramuscular TID PRN Motley-Mangrum, Jadeka A, PMHNP   50 mg at 05/17/24 1233   And   LORazepam  (ATIVAN ) injection 2 mg  2 mg Intramuscular TID PRN Motley-Mangrum, Jadeka A, PMHNP   2 mg at 05/17/24 1233   magnesium  hydroxide (MILK OF MAGNESIA) suspension 30 mL  30 mL Oral Daily PRN Motley-Mangrum, Jadeka A, PMHNP       QUEtiapine  (SEROQUEL  XR) 24 hr tablet 200 mg  200 mg Oral QHS Khila Papp L, PA-C       traZODone  (DESYREL ) tablet 50 mg  50 mg Oral QHS PRN Motley-Mangrum, Jadeka A, PMHNP   50 mg at 05/14/24 2105    Lab Results:  No results found for this or any previous visit (from the past 48 hours).   Blood Alcohol level:  Lab Results  Component Value Date   [facility] <15 04/30/2024   ETH <15 04/09/2024    Metabolic Disorder Labs: Lab Results  Component Value Date   HGBA1C 5.1 10/31/2023   MPG 99.67 10/31/2023   No results found for: PROLACTIN Lab Results  Component Value Date   CHOL 136 10/31/2023   TRIG 75 10/31/2023   HDL 37 (L) 10/31/2023   CHOLHDL 3.7 10/31/2023   VLDL 15 10/31/2023   LDLCALC 84 10/31/2023    Physical Findings: AIMS:  , ,  ,  ,    CIWA:    COWS:      Musculoskeletal: Strength & Muscle Tone: within normal limits Gait & Station: normal Assets  Assets: Communication Skills  Mental Status Exam:  General Appearance: Casual Eye contact: Minimal Speech: Normal rate Speech volume: Decreased Mood: Depressed Affect: Flat Thought process: Linear Descriptions of associations: Intact Orientation: Full (time, place and person) he Thought content: WDL Hallucinations: None Ideas of reference: None Suicidal thoughts: No Homicidal thoughts: No Memory:  Immediate fair, recent fair, remote fair Judgment: Poor Insight: Poor Concentration: Fair Attention span: Fair Recall: [employer] of knowledge: Fair Language: Fair Psychomotor activity: Normal  Physical Exam: Physical Exam Vitals and nursing note reviewed.  HENT:     Head: Atraumatic.  Eyes:     Extraocular Movements: Extraocular movements intact.  Pulmonary:     Effort: Pulmonary effort is normal.  Neurological:     Mental Status: She is alert and oriented to person, place, and time.    ROS Blood pressure 125/82, pulse 82, temperature (!) 97.2 F (36.2 C), resp. rate 16, height 5' 4 (1.626 m), weight 109.3 kg, SpO2 100%. Body mass index is 41.37 kg/m.  Diagnosis: Principal Problem:   Bipolar 1 disorder (HCC)   PLAN: Safety and Monitoring:  -- Voluntary admission to inpatient psychiatric unit for safety, stabilization and treatment  -- Daily contact with patient to assess and evaluate symptoms and progress in treatment  --  Patient's case to be discussed in multi-disciplinary team meeting  -- Observation Level : q15 minute checks  -- Vital signs:  q12 hours  -- Precautions: suicide, elopement, and assault -- Encouraged patient to participate in unit milieu and in scheduled group therapies  2. Psychiatric Diagnoses and Treatment:  Bipolar disorder  Reviewed case with attending Dr. Donnelly who was agreeable with proceeding with LAI. Reviewed dosing with pharmacy who agreed.  Patient subsequently declined LAI. D/C lithium  300 mg b.I.d. and Abilify  5 mg at bedtime.  Labs ordered for thyroid  panel and vitamin D .  Discontinued Invega  per patient request Increase Seroquel  XR to 200 mg nightly               The risks/benefits/side-effects/alternatives to this medication were discussed in detail with the patient and time was given for questions. The patient consents to medication trial. -- Metabolic profile and EKG monitoring obtained while on an atypical antipsychotic  --  Encouraged patient to participate in unit milieu and in scheduled group therapies                    3. Medical Issues Being Addressed:  No medical issues identified at this time.   4. Discharge Planning:  Patient to be referred for ACT services.    -- Social work and case management to assist with discharge planning and identification of hospital follow-up needs prior to discharge  -- Estimated LOS: 1-2 weeks  Camelia LITTIE Lukes, PA-C 05/18/2024, 5:58 PM

## 2024-05-18 NOTE — Group Note (Signed)
 Date:  05/18/2024 Time:  4:35 PM  Group Topic/Focus:  Wellness Toolbox:   The focus of this group is to discuss various aspects of wellness, balancing those aspects and exploring ways to increase the ability to experience wellness.  Patients will create a wellness toolbox for use upon discharge.    Participation Level:  Minimal  Participation Quality:  Appropriate  Affect:  Appropriate  Cognitive:  Appropriate  Insight: Appropriate  Engagement in Group:  Engaged  Modes of Intervention:  Activity and Socialization  Additional Comments:    Lauren Poole 05/18/2024, 4:35 PM

## 2024-05-18 NOTE — Plan of Care (Signed)
   Problem: Education: Goal: Knowledge of [facility] Education information/materials will improve Outcome: Progressing Goal: Emotional status will improve Outcome: Progressing Goal: Mental status will improve Outcome: Progressing Goal: Verbalization of understanding the information provided will improve Outcome: Progressing   Problem: Activity: Goal: Interest or engagement in activities will improve Outcome: Progressing Goal: Sleeping patterns will improve Outcome: Progressing   Problem: Coping: Goal: Ability to verbalize frustrations and anger appropriately will improve Outcome: Progressing Goal: Ability to demonstrate self-control will improve Outcome: Progressing

## 2024-05-18 NOTE — Group Note (Signed)
 Date:  05/18/2024 Time:  1:34 AM  Group Topic/Focus:  Wrap-Up Group:   The focus of this group is to help patients review their daily goal of treatment and discuss progress on daily workbooks.    Participation Level:  Did Not Attend   Additional Comments:    Kristen VEAR Gibbon 05/18/2024, 1:34 AM

## 2024-05-18 NOTE — Group Note (Signed)
 Recreation Therapy Group Note   Group Topic:Health and Wellness  Group Date: 05/18/2024 Start Time: 1000 End Time: 1055 Facilitators: Celestia Jeoffrey BRAVO, LRT, CTRS Location: Courtyard  Group Description: [employer]. LRT and patients played games of basketball, drew with chalk, and played corn hole while outside in the courtyard while getting fresh air and sunlight. Music was being played in the background. LRT and peers conversed about different games they have played before, what they do in their free time and anything else that is on their minds. LRT encouraged pts to drink water after being outside, sweating and getting their heart rate up.  Goal Area(s) Addressed: Patient will build on frustration tolerance skills. Patients will partake in a competitive play game with peers. Patients will gain knowledge of new leisure interest/hobby.    Affect/Mood: N/A   Participation Level: Did not attend    Clinical Observations/Individualized Feedback: Patient did not attend group.  Plan: Continue to engage patient in RT group sessions 2-3x/week.   Jeoffrey BRAVO Celestia, LRT, CTRS 05/18/2024 12:59 PM

## 2024-05-18 NOTE — Group Note (Signed)
 Date:  05/18/2024 Time:  1:40 PM  Group Topic/Focus:  Goals Group:   The focus of this group is to help patients establish daily goals to achieve during treatment and discuss how the patient can incorporate goal setting into their daily lives to aide in recovery.    Participation Level:  Did Not Attend   Lauren Poole 05/18/2024, 1:40 PM

## 2024-05-18 NOTE — Group Note (Signed)
 LCSW Group Therapy Note  Group Date: 05/18/2024 Start Time: 1315 End Time: 1400   Type of Therapy and Topic:  Group Therapy - How To Cope with Nervousness about Discharge   Participation Level:  None   Description of Group This process group involved identification of patients' feelings about discharge. Some of them are scheduled to be discharged soon, while others are new admissions, but each of them was asked to share thoughts and feelings surrounding discharge from the hospital. One common theme was that they are excited at the prospect of going home, while another was that many of them are apprehensive about sharing why they were hospitalized. Patients were given the opportunity to discuss these feelings with their peers in preparation for discharge.  Therapeutic Goals  Patient will identify their overall feelings about pending discharge. Patient will think about how they might proactively address issues that they believe will once again arise once they get home (i.e. with parents). Patients will participate in discussion about having hope for change.   Summary of Patient Progress:   Patient attended group late.  Once in attendance the pt did not participate in group discussion.      Therapeutic Modalities Cognitive Behavioral Therapy   Sherryle JINNY Margo, LCSW 05/18/2024  3:13 PM

## 2024-05-18 NOTE — Progress Notes (Signed)
 Pt in be through out the shift. When engaged, soft spoken and will not process with staff.  She took scheduled Seroquel .   05/17/24 2200  Psychosocial Assessment  Patient Complaints None  Eye Contact Brief  Facial Expression Masked  Affect Inconsistent with thought content  Speech Soft  Interaction No initiation  Motor Activity Slow  Appearance/Hygiene In scrubs  Behavior Characteristics Resistant to care  Mood Pleasant  Aggressive Behavior  Effect No apparent injury  Thought Process  Coherency WDL  Content Preoccupation  Delusions None reported or observed  Perception WDL  Hallucination None reported or observed  Judgment Poor  Confusion WDL  Danger to Self  Current suicidal ideation? Denies  Agreement Not to Harm Self Yes  Description of Agreement verbal  Danger to Others  Danger to Others None reported or observed

## 2024-05-19 NOTE — Plan of Care (Signed)
  Problem: Activity: Goal: Interest or engagement in activities will improve Outcome: Not Progressing   Problem: Physical Regulation: Goal: Ability to maintain clinical measurements within normal limits will improve Outcome: Progressing   Problem: Safety: Goal: Periods of time without injury will increase Outcome: Progressing

## 2024-05-19 NOTE — Group Note (Signed)
 Recreation Therapy Group Note   Group Topic:Leisure Education  Group Date: 05/19/2024 Start Time: 1300 End Time: 1400 Facilitators: Celestia Jeoffrey BRAVO, LRT, CTRS Location: Craft Room  Group Description: Leisure. Patients were given the option to choose from journaling, coloring, drawing, making origami, playing with playdoh, listening to music or singing karaoke. LRT and pts discussed the meaning of leisure, the importance of participating in leisure during their free time/when they're outside of the hospital, as well as how our leisure interests can also serve as coping skills.    Goal Area(s) Addressed:    Patient will identify a current leisure interest.    Patient will learn the definition of "leisure".   Patient will practice making a positive decision.   Patient will have the opportunity to try a new leisure activity.   Patient will communicate with peers and LRT.     Affect/Mood: N/A   Participation Level: Did not attend    Clinical Observations/Individualized Feedback: Patient did not attend group.   Plan: Continue to engage patient in RT group sessions 2-3x/week.   Jeoffrey BRAVO Celestia, LRT, CTRS 05/19/2024 2:06 PM

## 2024-05-19 NOTE — Progress Notes (Signed)
   05/19/24 2007  Psych Admission Type (Psych Patients Only)  Admission Status Voluntary  Psychosocial Assessment  Patient Complaints None  Eye Contact Fair  Facial Expression Flat  Affect Flat  Speech Soft  Interaction Minimal  Motor Activity Slow  Appearance/Hygiene In scrubs  Behavior Characteristics Cooperative;Calm  Mood Pleasant  Aggressive Behavior  Effect No apparent injury  Thought Process  Coherency WDL  Content WDL  Delusions None reported or observed  Perception WDL  Hallucination None reported or observed  Judgment Poor  Confusion WDL  Danger to Self  Current suicidal ideation? Denies  Self-Injurious Behavior No self-injurious ideation or behavior indicators observed or expressed   Agreement Not to Harm Self Yes  Description of Agreement Verbal  Danger to Others  Danger to Others None reported or observed

## 2024-05-19 NOTE — Group Note (Signed)
 Date:  05/19/2024 Time:  5:17 PM  Group Topic/Focus:  Wellness Toolbox:   The focus of this group is to discuss various aspects of wellness, balancing those aspects and exploring ways to increase the ability to experience wellness.  Patients will create a wellness toolbox for use upon discharge.    Participation Level:  Did Not Attend   Lauren Poole [facility] 05/19/2024, 5:17 PM

## 2024-05-19 NOTE — Group Note (Signed)
 Date:  05/19/2024 Time:  8:57 PM  Group Topic/Focus:  Managing Feelings:   The focus of this group is to identify what feelings patients have difficulty handling and develop a plan to handle them in a healthier way upon discharge.    Participation Level:  Did Not Attend  Participation Quality:  none  Affect:  none  Cognitive:  none  Insight: Appropriate and Good  Engagement in Group:  Engaged  Modes of Intervention:  Discussion  Additional Comments:     Kerri Katz 05/19/2024, 8:57 PM

## 2024-05-19 NOTE — Plan of Care (Signed)
  Problem: Activity: Goal: Sleeping patterns will improve Outcome: Progressing   Problem: Health Behavior/Discharge Planning: Goal: Compliance with treatment plan for underlying cause of condition will improve Outcome: Progressing   Problem: Safety: Goal: Periods of time without injury will increase Outcome: Progressing   

## 2024-05-19 NOTE — Progress Notes (Signed)
 [facility] MD Progress Note  05/19/2024 8:53 PM Lauren Poole  MRN:  [id number]  33 year old female who presented to the ED via EMS following a suicide attempt by overdose on 04/30/24. She reported ingesting approximately 30 Aleve pills. After ingestion, the patient did not experience vomiting or other acute physical symptoms. She reports that initially she wanted to not wake up, but later became fearful and contacted a friend, who encouraged her to seek care in the ED. The patient reports she has struggled with chronic suicidal ideation for about 1 year.  She states things have been hard but declines to elaborate on specific triggers.   Subjective:  Chart reviewed, case discussed in multidisciplinary meeting, patient seen during rounds.   10/3: On interview today, patient is noted to be laying in bed.  She continues to display flat affect and be minimally engaged in interview.  She reports tolerating increased dose of Seroquel  well without adverse effects.  She rates depression as a 1 out of 10 and anxiety is at a 0 out of 10 today.  She denies SI/HI/plan and denies hallucinations.  She continues to minimize symptoms and demonstrates lack of insight into her mental health condition.  Per social work team, APS report has been made and they have accepted the case.  Patient does not voice any concerns or complaints today.    10/2: On interview today, patient noted to be laying in bed, she continues to be minimally engaging during interview and is noted to have a flat affect.  She continues to lack insight into her mental health.  She required behavioral PRN yesterday after she was observed to be making gestures in the hallway and yelling at staff.  She rates depression as 3 out of 10 and anxiety at 0 out of 10 today.  She denies SI/HI/plan and denies hallucinations.  She is agreeable to Seroquel  dose increase.  10/1: On interview today, patient is noted to be laying in bed and engages minimally with  the provider. She reports tolerating Seroquel  restart well without adverse effects.  Patient endorses passive SI without intent or plan.  She denies HI/plan and denies hallucinations.  Patient declines to participate in interview further.  9/30: On interview today, patient is noted to be laying in bed with a pillow covering her face.  She sits up to engage with provider.  She rates depression as a 2-3 out of 10 and anxiety as 4-5 out of 10 today.  She denies SI/HI/plan and denies hallucinations.  She is sleeping well and appetite is stable.  She states she does not like Invega  and refused dose today, stating it has made her feel more down than even.  She requests to discontinue Invega  for this reason.  She would like to restart Seroquel , though requests to start at a lower dose than what she was previously taking which was 200 mg, stating she felt tired with 200 mg dose.  Case discussed with attending physician and social work team.  Referral to be placed to APS.  9/29: On interview today, patient is found to be laying in bed and is minimally engaging with the provider.  She shows apathy, poor insight, and is unable to discuss her treatment goals.  Patient declined LAI.  She reports some emotional blunting with Invega  but otherwise is tolerating it well.  She states she initially found the medication beneficial in the first few days of taking it.  When asked about discharge planning, patient states she is not sure where  she will go and states she does not care where she goes, commenting [place]  When asked about support system, patient states that she does not have any friends or family.  She denies SI/HI/plan and denies hallucinations.  Patient declines to participate further in the interview.  9/28: Patient is seen for follow-up they are alert and oriented on exam.  They continue to decline LAI and indicated they will continue with the oral medication.  They continue to have  poor insight into the need for medication compliance and outpatient follow-up.  They deny current SI HI and AVH.  They do not require behavioral PRNs overnight.  They have been reclusive to room.  Engaged him in discharge planning discussing options such as shelters, mother's house, or friend's house and they are reaching out to be able today it appears they will likely need to go to the shelter though.  05/13/24: Per nursing patient declined LAI this morning.  On interview she indicates she does not take any longer.  She denies SI, HI, and AVH.  She reports she is agreeable to taking the oral 3 mg dose.  She demonstrates poor insight.  Discussed her past psych records of poor medication compliance and how long-acting injectable would remove the need for her to take medication every day.  She continues to decline despite previously agreeing.  She did not require behavioral PRNs overnight they are performing ADLs.  They voiced no concerns or complaints.  Continue with some paranoia and bizarre behavior we will continue to monitor.  05/12/24: Per follow-up they are alert and oriented. They are feeling more like themself. Still some bizarre behavior discusses that they want to fast for 7 days and are unable to explain why. Then begins discussing their current hunger. They Deny SI/HI/AVH. She does laugh inappropriately at times. Notes no dizziness this morning. Risk, benefit, and adverse effects of invega  sustenna are reviewed and pt consents to LAI. They are performing ADLS. They voice no concerns or complaints. No behavioral PRNS overnight. Act referral is being placed.   05/11/24: Per follow-up this morning they are alert and oriented.  They are pleasant and cooperative.  They continue to feel the Invega  is effective and they feel her thought processes been more linear and their mood has been more stable.  They continue to note some passive SI they deny plan or intent.  They had some dizziness this morning they  are unclear on the timeframe with reference to getting the higher dose of Invega  today we will continue to monitor.  She does not appear to be internally preoccupied nor she observed to be responding to internal stimuli.  She denies HI.  Given multiple recent hospitalizations we will continue to monitor work towards setting patient up with ACT team and progress towards LAI in the coming days.  05/10/24: Patient seen for follow up. They are alert and oriented. They are pleasant and cooperative on exam. They feel that the invega  is working well and are agreeable to continued titration. They noted some passive si today without plan or intent. Disussed medication options for depression and patient indicates she is only willing to take the Invega  at this time but that she is open to an LAI. She denies HI and AVH. Discussed ACTT services given the multiple recent hospitalizations. Sleep and appetite are stable. Had an argument with another pt during group yesterday but feels that it was because the pt was judging them.  No behavioral PRNS overnight.   05/09/24: Patient seen for follow up. They are alert and oriented. They report they are not agreeable to continuing abilify . They are not agreeable to ongoing lab test required for Lithium , will discontinue both. Discussed invega  and LAI, patient is agreeable to trial of invega  with openess to transitioning to LAI. She denies SI/HI/AVH. Notes improving sleep and stable appetite.   05/08/2024: On interview today patient is noted to be sitting up in bed.  She is more engaged in interview today.  She reports she has not started Abilify  yet but is planning to take first dose this evening.  She has started lithium  and is tolerating this well.  She endorses symptoms of depression but denies persistent anxiety.  She denies current SI/HI/plan and denies hallucinations.  She notes most recent episode of passive SI was yesterday.  She does not voice any concerns or complaints today.   She discusses desire to do something productive with her life.  05/07/2024: Per nursing report patient did not come out and remained mostly to her room.  This Clinical research associate talked with the patient and her room.  Patient reports that she got Seroquel  yesterday and still thinks sleepy because of that.  She reports that she got lithium  today in the morning for the first time and would get Abilify  tonight.  Patient continues to report having suicidal ideations.  Reports that the last time she had suicidal ideations was yesterday in the evening time and she just woke up in the morning.  Still feeling depressed and rates her depression as 5 out of 10.  Patient reports that her normal self is when she rates her depression as 2 out of 10.  Continues to report that she has thoughts of shooting herself if she would have a gun.  But she does not have any access to guns.    05/06/2024:   per staff report patient has refused to take the Seroquel  at night yesterday.  Patient is still coming out and staying in her home.  Patient on interview reports that she came suicide attempt and reports that she had 1 previous suicide attempt this year.  Patient continues to report that she is depressed.  Patient reports that it is end of road for her.  Patient reports that she does not have access to gun but if she has done she would just get it over with.  Patient remained vague but reports that she has ongoing financial, family, life stressors.  Patient reports history consistent with bipolar 1   With manic episode lasting for 1 month but reports that these days she is not  having manic symptoms and mostly getting depressed.   9/19: On interview today patient is noted to be seated up in bed looking towards the window.  She is more engaged in interview today than she has been in recent days.   She was compliant with Seroquel  dose last night, and states she feels that it is working.  She reports tolerating medication well without adverse  effects.  She is sleeping well and reports appetite as normal.  She denies SI/HI/plan and denies hallucinations.  She rates depression as a 4 out of 10 and anxiety as a 0 out of 10 today.  She does not voice any concerns or complaints today.  9/18: On interview today patient is noted to be alert and laying in bed.  She declines to communicate with provider verbally.  Patient responds to questions during interview with a  head nod or head shake.  Patient declined medication last night per nursing report.  She denies SI/HI/plan and denies hallucinations via shaking her head to indicate 'no' when asked.  She declined to answer questions regarding sleep and appetite.  When asked if tolerating medication patient nods head to indicate 'yes'.  Patient declined to participate further in interview.  9/17: On interview today patient is noted to be laying in bed with hand covering her face.  She continues to be withdrawn and minimizes symptoms.  She denies current SI/HI/plan and denies hallucinations.  She states she is tolerating initiation of Seroquel  well.  Patient declines to answer further questions.  9/16: On interview, patient is noted to be laying in bed.  Per nursing report, she has primarily been isolated to her room since her arrival on the unit.  She endorses passive SI without current intent or plan.  She denies HI/plan and denies hallucinations.  She reports she is sleeping and eating well.  Interviewer discussed treatment options with patient to include medication.  Patient states she prefers to be restarted on Seroquel , which she has taken in the past with good response.  Patient does not voice any other complaints or concerns today.   Sleep: Fair  Appetite: Fair  Past Psychiatric History: see h&P Family History:  Family History  Problem Relation Age of Onset   Schizophrenia Sister    Social History:  Social History   Substance and Sexual Activity  Alcohol Use Yes   Alcohol/week: 4.0  standard drinks of alcohol   Types: 4 Glasses of wine per week     Social History   Substance and Sexual Activity  Drug Use Not Currently   Types: Marijuana    Social History   Socioeconomic History   Marital status: Single    Spouse name: Not on file   Number of children: Not on file   Years of education: Not on file   Highest education level: Not on file  Occupational History   Not on file  Tobacco Use   Smoking status: Every Day    Current packs/day: 0.50    Average packs/day: 0.5 packs/day for 6.6 years (3.3 ttl pk-yrs)    Types: Cigarettes    Start date: 10/2017   Smokeless tobacco: Never  Vaping Use   Vaping status: Never Used  Substance and Sexual Activity   Alcohol use: Yes    Alcohol/week: 4.0 standard drinks of alcohol    Types: 4 Glasses of wine per week   Drug use: Not Currently    Types: Marijuana   Sexual activity: Yes    Birth control/protection: None  Other Topics Concern   Not on file  Social History Narrative   Not on file   Social Drivers of Health   Financial Resource Strain: Not on file  Food Insecurity: Food Insecurity Present (04/30/2024)   Hunger Vital Sign    Worried About Running Out of Food in the Last Year: Sometimes true    Ran Out of Food in the Last Year: Sometimes true  Transportation Needs: Unmet Transportation Needs (04/30/2024)   PRAPARE - Administrator, Civil Service (Medical): Yes    Lack of Transportation (Non-Medical): Yes  Physical Activity: Not on file  Stress: Not on file  Social Connections: Not on file   Past Medical History:  Past Medical History:  Diagnosis Date   Bipolar 1 disorder (HCC)    Chlamydia    Gonorrhea     Past  Surgical History:  Procedure Laterality Date   NO PAST SURGERIES      Current Medications: Current Facility-Administered Medications  Medication Dose Route Frequency Provider Last Rate Last Admin   acetaminophen  (TYLENOL ) tablet 650 mg  650 mg Oral Q6H PRN Motley-Mangrum,  Jadeka A, PMHNP       alum & mag hydroxide-simeth (MAALOX/MYLANTA) 200-200-20 MG/5ML suspension 30 mL  30 mL Oral Q4H PRN Motley-Mangrum, Jadeka A, PMHNP       haloperidol  (HALDOL ) tablet 5 mg  5 mg Oral TID PRN Motley-Mangrum, Jadeka A, PMHNP       And   diphenhydrAMINE  (BENADRYL ) capsule 50 mg  50 mg Oral TID PRN Motley-Mangrum, Jadeka A, PMHNP       haloperidol  lactate (HALDOL ) injection 5 mg  5 mg Intramuscular TID PRN Motley-Mangrum, Jadeka A, PMHNP       And   diphenhydrAMINE  (BENADRYL ) injection 50 mg  50 mg Intramuscular TID PRN Motley-Mangrum, Jadeka A, PMHNP       And   LORazepam  (ATIVAN ) injection 2 mg  2 mg Intramuscular TID PRN Motley-Mangrum, Jadeka A, PMHNP       haloperidol  lactate (HALDOL ) injection 10 mg  10 mg Intramuscular TID PRN Motley-Mangrum, Jadeka A, PMHNP   10 mg at 05/17/24 1233   And   diphenhydrAMINE  (BENADRYL ) injection 50 mg  50 mg Intramuscular TID PRN Motley-Mangrum, Jadeka A, PMHNP   50 mg at 05/17/24 1233   And   LORazepam  (ATIVAN ) injection 2 mg  2 mg Intramuscular TID PRN Motley-Mangrum, Jadeka A, PMHNP   2 mg at 05/17/24 1233   magnesium  hydroxide (MILK OF MAGNESIA) suspension 30 mL  30 mL Oral Daily PRN Motley-Mangrum, Jadeka A, PMHNP       QUEtiapine  (SEROQUEL  XR) 24 hr tablet 200 mg  200 mg Oral QHS Roberto Hlavaty L, PA-C   200 mg at 05/18/24 2131   traZODone  (DESYREL ) tablet 50 mg  50 mg Oral QHS PRN Motley-Mangrum, Jadeka A, PMHNP   50 mg at 05/14/24 2105    Lab Results:  No results found for this or any previous visit (from the past 48 hours).   Blood Alcohol level:  Lab Results  Component Value Date   [facility] <15 04/30/2024   ETH <15 04/09/2024    Metabolic Disorder Labs: Lab Results  Component Value Date   HGBA1C 5.1 10/31/2023   MPG 99.67 10/31/2023   No results found for: PROLACTIN Lab Results  Component Value Date   CHOL 136 10/31/2023   TRIG 75 10/31/2023   HDL 37 (L) 10/31/2023   CHOLHDL 3.7 10/31/2023   VLDL 15 10/31/2023    LDLCALC 84 10/31/2023    Physical Findings: AIMS:  , ,  ,  ,    CIWA:    COWS:      Musculoskeletal: Strength & Muscle Tone: within normal limits Gait & Station: normal Assets  Assets: Communication Skills  Mental Status Exam:  General Appearance: Casual Eye contact: Minimal Speech: Normal rate Speech volume: Decreased Mood: Depressed Affect: Flat Thought process: Linear Descriptions of associations: Intact Orientation: Full (time, place and person) he Thought content: WDL Hallucinations: None Ideas of reference: None Suicidal thoughts: No Homicidal thoughts: No Memory: Immediate fair, recent fair, remote fair Judgment: Poor Insight: Poor Concentration: Fair Attention span: Fair Recall: [employer] of knowledge: Fair Language: Fair Psychomotor activity: Normal  Physical Exam: Physical Exam Vitals and nursing note reviewed.  HENT:     Head: Atraumatic.  Eyes:  Extraocular Movements: Extraocular movements intact.  Pulmonary:     Effort: Pulmonary effort is normal.  Neurological:     Mental Status: She is alert and oriented to person, place, and time.    ROS Blood pressure 125/82, pulse 82, temperature (!) 97.2 F (36.2 C), resp. rate 16, height 5' 4 (1.626 m), weight 109.3 kg, SpO2 100%. Body mass index is 41.37 kg/m.  Diagnosis: Principal Problem:   Bipolar 1 disorder (HCC)   PLAN: Safety and Monitoring:  -- Voluntary admission to inpatient psychiatric unit for safety, stabilization and treatment  -- Daily contact with patient to assess and evaluate symptoms and progress in treatment  -- Patient's case to be discussed in multi-disciplinary team meeting  -- Observation Level : q15 minute checks  -- Vital signs:  q12 hours  -- Precautions: suicide, elopement, and assault -- Encouraged patient to participate in unit milieu and in scheduled group therapies  2. Psychiatric Diagnoses and Treatment:  Bipolar disorder  Reviewed case with attending  Dr. Donnelly who was agreeable with proceeding with LAI. Reviewed dosing with pharmacy who agreed.  Patient subsequently declined LAI. D/C lithium  300 mg b.I.d. and Abilify  5 mg at bedtime.  Labs ordered for thyroid  panel and vitamin D .  Discontinued Invega  per patient request Continue Seroquel  XR 200 mg nightly               The risks/benefits/side-effects/alternatives to this medication were discussed in detail with the patient and time was given for questions. The patient consents to medication trial. -- Metabolic profile and EKG monitoring obtained while on an atypical antipsychotic  -- Encouraged patient to participate in unit milieu and in scheduled group therapies                    3. Medical Issues Being Addressed:  No medical issues identified at this time.   4. Discharge Planning:  Patient to be referred for ACT services.    -- Social work and case management to assist with discharge planning and identification of hospital follow-up needs prior to discharge  -- Estimated LOS: 1-2 weeks  Camelia LITTIE Lukes, PA-C 05/19/2024, 8:53 PM

## 2024-05-19 NOTE — Progress Notes (Signed)
   05/19/24 0900  Psych Admission Type (Psych Patients Only)  Admission Status Voluntary  Psychosocial Assessment  Patient Complaints None  Eye Contact Fair  Facial Expression Flat  Affect Blunted  Speech Soft  Interaction No initiation  Motor Activity Slow  Appearance/Hygiene In scrubs  Behavior Characteristics Unwilling to participate  Mood Pleasant  Thought Process  Coherency WDL  Content Preoccupation  Delusions None reported or observed  Perception WDL  Hallucination None reported or observed  Judgment Poor  Confusion WDL  Danger to Self  Current suicidal ideation? Denies  Self-Injurious Behavior No self-injurious ideation or behavior indicators observed or expressed   Agreement Not to Harm Self Yes  Description of Agreement verbal  Danger to Others  Danger to Others None reported or observed

## 2024-05-20 NOTE — Progress Notes (Signed)
   05/20/24 1400  Psych Admission Type (Psych Patients Only)  Admission Status Voluntary  Psychosocial Assessment  Patient Complaints Depression  Eye Contact Fair  Facial Expression Flat  Affect Flat  Speech Soft  Interaction Isolative;No initiation  Motor Activity Slow  Appearance/Hygiene Unremarkable  Behavior Characteristics Cooperative  Mood Apathetic  Thought Process  Coherency WDL  Content WDL  Delusions None reported or observed  Perception WDL  Hallucination None reported or observed  Judgment Poor  Confusion None  Danger to Self  Current suicidal ideation? Denies  Self-Injurious Behavior No self-injurious ideation or behavior indicators observed or expressed   Danger to Others  Danger to Others None reported or observed

## 2024-05-20 NOTE — Progress Notes (Signed)
 [facility] MD Progress Note  05/20/2024 2:47 PM Lauren Poole  MRN:  [id number]  33 year old female who presented to the ED via EMS following a suicide attempt by overdose on 04/30/24. She reported ingesting approximately 30 Aleve pills. After ingestion, the patient did not experience vomiting or other acute physical symptoms. She reports that initially she wanted to not wake up, but later became fearful and contacted a friend, who encouraged her to seek care in the ED. The patient reports she has struggled with chronic suicidal ideation for about 1 year.  She states things have been hard but declines to elaborate on specific triggers.   Subjective:  Chart reviewed, case discussed in multidisciplinary meeting, patient seen during rounds.   10/4: On interview today, patient is noted to be laying in bed and is minimally engaged in interview.  She rates depression as 2 out of 10 and anxiety is 2 out of 10 today she denies SI/HI/plan and denies hallucinations.  She continues to minimize symptoms and shows poor insight into her mental health condition.  She denies physical complaints.  She reports tolerating current medication regimen well and denies adverse effects.  She declines Seroquel  dose increase at this time stating she will refuse it if dose is increased.  Medication trials during this admission include lithium , Abilify , and Invega .  10/3: On interview today, patient is noted to be laying in bed.  She continues to display flat affect and be minimally engaged in interview.  She reports tolerating increased dose of Seroquel  well without adverse effects.  She rates depression as a 1 out of 10 and anxiety is at a 0 out of 10 today.  She denies SI/HI/plan and denies hallucinations.  She continues to minimize symptoms and demonstrates lack of insight into her mental health condition.  Per social work team, APS report has been made and they have accepted the case.  Patient does not voice any concerns or  complaints today.    10/2: On interview today, patient noted to be laying in bed, she continues to be minimally engaging during interview and is noted to have a flat affect.  She continues to lack insight into her mental health.  She required behavioral PRN yesterday after she was observed to be making gestures in the hallway and yelling at staff.  She rates depression as 3 out of 10 and anxiety at 0 out of 10 today.  She denies SI/HI/plan and denies hallucinations.  She is agreeable to Seroquel  dose increase.  10/1: On interview today, patient is noted to be laying in bed and engages minimally with the provider. She reports tolerating Seroquel  restart well without adverse effects.  Patient endorses passive SI without intent or plan.  She denies HI/plan and denies hallucinations.  Patient declines to participate in interview further.  9/30: On interview today, patient is noted to be laying in bed with a pillow covering her face.  She sits up to engage with provider.  She rates depression as a 2-3 out of 10 and anxiety as 4-5 out of 10 today.  She denies SI/HI/plan and denies hallucinations.  She is sleeping well and appetite is stable.  She states she does not like Invega  and refused dose today, stating it has made her feel more down than even.  She requests to discontinue Invega  for this reason.  She would like to restart Seroquel , though requests to start at a lower dose than what she was previously taking which was 200 mg, stating she felt tired  with 200 mg dose.  Case discussed with attending physician and social work team.  Referral to be placed to APS.  9/29: On interview today, patient is found to be laying in bed and is minimally engaging with the provider.  She shows apathy, poor insight, and is unable to discuss her treatment goals.  Patient declined LAI.  She reports some emotional blunting with Invega  but otherwise is tolerating it well.  She states she initially found the medication  beneficial in the first few days of taking it.  When asked about discharge planning, patient states she is not sure where she will go and states she does not care where she goes, commenting [place]  When asked about support system, patient states that she does not have any friends or family.  She denies SI/HI/plan and denies hallucinations.  Patient declines to participate further in the interview.  9/28: Patient is seen for follow-up they are alert and oriented on exam.  They continue to decline LAI and indicated they will continue with the oral medication.  They continue to have poor insight into the need for medication compliance and outpatient follow-up.  They deny current SI HI and AVH.  They do not require behavioral PRNs overnight.  They have been reclusive to room.  Engaged him in discharge planning discussing options such as shelters, mother's house, or friend's house and they are reaching out to be able today it appears they will likely need to go to the shelter though.  05/13/24: Per nursing patient declined LAI this morning.  On interview she indicates she does not take any longer.  She denies SI, HI, and AVH.  She reports she is agreeable to taking the oral 3 mg dose.  She demonstrates poor insight.  Discussed her past psych records of poor medication compliance and how long-acting injectable would remove the need for her to take medication every day.  She continues to decline despite previously agreeing.  She did not require behavioral PRNs overnight they are performing ADLs.  They voiced no concerns or complaints.  Continue with some paranoia and bizarre behavior we will continue to monitor.  05/12/24: Per follow-up they are alert and oriented. They are feeling more like themself. Still some bizarre behavior discusses that they want to fast for 7 days and are unable to explain why. Then begins discussing their current hunger. They Deny SI/HI/AVH. She does laugh  inappropriately at times. Notes no dizziness this morning. Risk, benefit, and adverse effects of invega  sustenna are reviewed and pt consents to LAI. They are performing ADLS. They voice no concerns or complaints. No behavioral PRNS overnight. Act referral is being placed.   05/11/24: Per follow-up this morning they are alert and oriented.  They are pleasant and cooperative.  They continue to feel the Invega  is effective and they feel her thought processes been more linear and their mood has been more stable.  They continue to note some passive SI they deny plan or intent.  They had some dizziness this morning they are unclear on the timeframe with reference to getting the higher dose of Invega  today we will continue to monitor.  She does not appear to be internally preoccupied nor she observed to be responding to internal stimuli.  She denies HI.  Given multiple recent hospitalizations we will continue to monitor work towards setting patient up with ACT team and progress towards LAI in the coming days.  05/10/24: Patient seen for  follow up. They are alert and oriented. They are pleasant and cooperative on exam. They feel that the invega  is working well and are agreeable to continued titration. They noted some passive si today without plan or intent. Disussed medication options for depression and patient indicates she is only willing to take the Invega  at this time but that she is open to an LAI. She denies HI and AVH. Discussed ACTT services given the multiple recent hospitalizations. Sleep and appetite are stable. Had an argument with another pt during group yesterday but feels that it was because the pt was judging them. No behavioral PRNS overnight.   05/09/24: Patient seen for follow up. They are alert and oriented. They report they are not agreeable to continuing abilify . They are not agreeable to ongoing lab test required for Lithium , will discontinue both. Discussed invega  and LAI, patient is agreeable to  trial of invega  with openess to transitioning to LAI. She denies SI/HI/AVH. Notes improving sleep and stable appetite.   05/08/2024: On interview today patient is noted to be sitting up in bed.  She is more engaged in interview today.  She reports she has not started Abilify  yet but is planning to take first dose this evening.  She has started lithium  and is tolerating this well.  She endorses symptoms of depression but denies persistent anxiety.  She denies current SI/HI/plan and denies hallucinations.  She notes most recent episode of passive SI was yesterday.  She does not voice any concerns or complaints today.  She discusses desire to do something productive with her life.  05/07/2024: Per nursing report patient did not come out and remained mostly to her room.  This Clinical research associate talked with the patient and her room.  Patient reports that she got Seroquel  yesterday and still thinks sleepy because of that.  She reports that she got lithium  today in the morning for the first time and would get Abilify  tonight.  Patient continues to report having suicidal ideations.  Reports that the last time she had suicidal ideations was yesterday in the evening time and she just woke up in the morning.  Still feeling depressed and rates her depression as 5 out of 10.  Patient reports that her normal self is when she rates her depression as 2 out of 10.  Continues to report that she has thoughts of shooting herself if she would have a gun.  But she does not have any access to guns.    05/06/2024:   per staff report patient has refused to take the Seroquel  at night yesterday.  Patient is still coming out and staying in her home.  Patient on interview reports that she came suicide attempt and reports that she had 1 previous suicide attempt this year.  Patient continues to report that she is depressed.  Patient reports that it is end of road for her.  Patient reports that she does not have access to gun but if she has done she  would just get it over with.  Patient remained vague but reports that she has ongoing financial, family, life stressors.  Patient reports history consistent with bipolar 1   With manic episode lasting for 1 month but reports that these days she is not  having manic symptoms and mostly getting depressed.   9/19: On interview today patient is noted to be seated up in bed looking towards the window.  She is more engaged in interview today than she has been in recent days.  She was compliant with Seroquel  dose last night, and states she feels that it is working.  She reports tolerating medication well without adverse effects.  She is sleeping well and reports appetite as normal.  She denies SI/HI/plan and denies hallucinations.  She rates depression as a 4 out of 10 and anxiety as a 0 out of 10 today.  She does not voice any concerns or complaints today.  9/18: On interview today patient is noted to be alert and laying in bed.  She declines to communicate with provider verbally.  Patient responds to questions during interview with a head nod or head shake.  Patient declined medication last night per nursing report.  She denies SI/HI/plan and denies hallucinations via shaking her head to indicate 'no' when asked.  She declined to answer questions regarding sleep and appetite.  When asked if tolerating medication patient nods head to indicate 'yes'.  Patient declined to participate further in interview.  9/17: On interview today patient is noted to be laying in bed with hand covering her face.  She continues to be withdrawn and minimizes symptoms.  She denies current SI/HI/plan and denies hallucinations.  She states she is tolerating initiation of Seroquel  well.  Patient declines to answer further questions.  9/16: On interview, patient is noted to be laying in bed.  Per nursing report, she has primarily been isolated to her room since her arrival on the unit.  She endorses passive SI without current intent or  plan.  She denies HI/plan and denies hallucinations.  She reports she is sleeping and eating well.  Interviewer discussed treatment options with patient to include medication.  Patient states she prefers to be restarted on Seroquel , which she has taken in the past with good response.  Patient does not voice any other complaints or concerns today.   Sleep: Fair  Appetite: Fair  Past Psychiatric History: see h&P Family History:  Family History  Problem Relation Age of Onset   Schizophrenia Sister    Social History:  Social History   Substance and Sexual Activity  Alcohol Use Yes   Alcohol/week: 4.0 standard drinks of alcohol   Types: 4 Glasses of wine per week     Social History   Substance and Sexual Activity  Drug Use Not Currently   Types: Marijuana    Social History   Socioeconomic History   Marital status: Single    Spouse name: Not on file   Number of children: Not on file   Years of education: Not on file   Highest education level: Not on file  Occupational History   Not on file  Tobacco Use   Smoking status: Every Day    Current packs/day: 0.50    Average packs/day: 0.5 packs/day for 6.6 years (3.3 ttl pk-yrs)    Types: Cigarettes    Start date: 10/2017   Smokeless tobacco: Never  Vaping Use   Vaping status: Never Used  Substance and Sexual Activity   Alcohol use: Yes    Alcohol/week: 4.0 standard drinks of alcohol    Types: 4 Glasses of wine per week   Drug use: Not Currently    Types: Marijuana   Sexual activity: Yes    Birth control/protection: None  Other Topics Concern   Not on file  Social History Narrative   Not on file   Social Drivers of Health   Financial Resource Strain: Not on file  Food Insecurity: Food Insecurity Present (04/30/2024)   Hunger Vital Sign  Worried About Programme researcher, broadcasting/film/video in the Last Year: Sometimes true    Ran Out of Food in the Last Year: Sometimes true  Transportation Needs: Unmet Transportation Needs  (04/30/2024)   PRAPARE - Administrator, Civil Service (Medical): Yes    Lack of Transportation (Non-Medical): Yes  Physical Activity: Not on file  Stress: Not on file  Social Connections: Not on file   Past Medical History:  Past Medical History:  Diagnosis Date   Bipolar 1 disorder (HCC)    Chlamydia    Gonorrhea     Past Surgical History:  Procedure Laterality Date   NO PAST SURGERIES      Current Medications: Current Facility-Administered Medications  Medication Dose Route Frequency Provider Last Rate Last Admin   acetaminophen  (TYLENOL ) tablet 650 mg  650 mg Oral Q6H PRN Motley-Mangrum, Jadeka A, PMHNP       alum & mag hydroxide-simeth (MAALOX/MYLANTA) 200-200-20 MG/5ML suspension 30 mL  30 mL Oral Q4H PRN Motley-Mangrum, Jadeka A, PMHNP       haloperidol  (HALDOL ) tablet 5 mg  5 mg Oral TID PRN Motley-Mangrum, Jadeka A, PMHNP       And   diphenhydrAMINE  (BENADRYL ) capsule 50 mg  50 mg Oral TID PRN Motley-Mangrum, Jadeka A, PMHNP       haloperidol  lactate (HALDOL ) injection 5 mg  5 mg Intramuscular TID PRN Motley-Mangrum, Jadeka A, PMHNP       And   diphenhydrAMINE  (BENADRYL ) injection 50 mg  50 mg Intramuscular TID PRN Motley-Mangrum, Jadeka A, PMHNP       And   LORazepam  (ATIVAN ) injection 2 mg  2 mg Intramuscular TID PRN Motley-Mangrum, Jadeka A, PMHNP       haloperidol  lactate (HALDOL ) injection 10 mg  10 mg Intramuscular TID PRN Motley-Mangrum, Jadeka A, PMHNP   10 mg at 05/17/24 1233   And   diphenhydrAMINE  (BENADRYL ) injection 50 mg  50 mg Intramuscular TID PRN Motley-Mangrum, Jadeka A, PMHNP   50 mg at 05/17/24 1233   And   LORazepam  (ATIVAN ) injection 2 mg  2 mg Intramuscular TID PRN Motley-Mangrum, Jadeka A, PMHNP   2 mg at 05/17/24 1233   magnesium  hydroxide (MILK OF MAGNESIA) suspension 30 mL  30 mL Oral Daily PRN Motley-Mangrum, Jadeka A, PMHNP       QUEtiapine  (SEROQUEL  XR) 24 hr tablet 200 mg  200 mg Oral QHS [place] Cordts L, PA-C   200 mg at  05/19/24 2119   traZODone  (DESYREL ) tablet 50 mg  50 mg Oral QHS PRN Motley-Mangrum, Jadeka A, PMHNP   50 mg at 05/14/24 2105    Lab Results:  No results found for this or any previous visit (from the past 48 hours).   Blood Alcohol level:  Lab Results  Component Value Date   [facility] <15 04/30/2024   ETH <15 04/09/2024    Metabolic Disorder Labs: Lab Results  Component Value Date   HGBA1C 5.1 10/31/2023   MPG 99.67 10/31/2023   No results found for: PROLACTIN Lab Results  Component Value Date   CHOL 136 10/31/2023   TRIG 75 10/31/2023   HDL 37 (L) 10/31/2023   CHOLHDL 3.7 10/31/2023   VLDL 15 10/31/2023   LDLCALC 84 10/31/2023    Physical Findings: AIMS:  , ,  ,  ,    CIWA:    COWS:      Musculoskeletal: Strength & Muscle Tone: within normal limits Gait & Station: normal Assets  Assets: Manufacturing systems engineer  Mental Status Exam:  General Appearance: Casual Eye contact: Minimal Speech: Normal rate Speech volume: Decreased Mood: Depressed Affect: Flat Thought process: Linear Descriptions of associations: Intact Orientation: Full (time, place and person) he Thought content: WDL Hallucinations: None Ideas of reference: None Suicidal thoughts: No Homicidal thoughts: No Memory: Immediate fair, recent fair, remote fair Judgment: Poor Insight: Poor Concentration: Fair Attention span: Fair Recall: [employer] of knowledge: Fair Language: Fair Psychomotor activity: Normal  Physical Exam: Physical Exam Vitals and nursing note reviewed.  HENT:     Head: Atraumatic.  Eyes:     Extraocular Movements: Extraocular movements intact.  Pulmonary:     Effort: Pulmonary effort is normal.  Neurological:     Mental Status: She is alert and oriented to person, place, and time.    ROS Blood pressure 125/82, pulse 82, temperature (!) 97.2 F (36.2 C), resp. rate 16, height 5' 4 (1.626 m), weight 109.3 kg, SpO2 100%. Body mass index is 41.37 kg/m.  Diagnosis:  Principal Problem:   Bipolar 1 disorder (HCC)   PLAN: Safety and Monitoring:  -- Voluntary admission to inpatient psychiatric unit for safety, stabilization and treatment  -- Daily contact with patient to assess and evaluate symptoms and progress in treatment  -- Patient's case to be discussed in multi-disciplinary team meeting  -- Observation Level : q15 minute checks  -- Vital signs:  q12 hours  -- Precautions: suicide, elopement, and assault -- Encouraged patient to participate in unit milieu and in scheduled group therapies  2. Psychiatric Diagnoses and Treatment:  Bipolar disorder  Reviewed case with attending Dr. Donnelly who was agreeable with proceeding with LAI. Reviewed dosing with pharmacy who agreed.  Patient subsequently declined LAI. D/C lithium  300 mg b.I.d. and Abilify  5 mg at bedtime.  Labs ordered for thyroid  panel and vitamin D .  Discontinued Invega  per patient request Continue Seroquel  XR 200 mg nightly with plan to titrate              The risks/benefits/side-effects/alternatives to this medication were discussed in detail with the patient and time was given for questions. The patient consents to medication trial. -- Metabolic profile and EKG monitoring obtained while on an atypical antipsychotic  -- Encouraged patient to participate in unit milieu and in scheduled group therapies                    3. Medical Issues Being Addressed:  No medical issues identified at this time.   4. Discharge Planning:  Patient to be referred for ACT services.   [facility] has accepted case  -- Social work and case management to assist with discharge planning and identification of hospital follow-up needs prior to discharge  -- Estimated LOS: 1-2 weeks  Camelia LITTIE Lukes, PA-C 05/20/2024, 2:47 PM

## 2024-05-20 NOTE — Group Note (Signed)
 Date:  05/20/2024 Time:  5:17 AM  Group Topic/Focus:  Conflict Resolution:   The focus of this group is to discuss the conflict resolution process and how it may be used upon discharge.    Participation Level:  Active  Participation Quality:  Appropriate  Affect:  Appropriate  Cognitive:  Appropriate  Insight: Appropriate  Engagement in Group:  Engaged  Modes of Intervention:  Discussion  Additional Comments:    Laiza Veenstra L 05/20/2024, 5:17 AM

## 2024-05-20 NOTE — Plan of Care (Signed)
  Problem: Education: Goal: Emotional status will improve Outcome: Not Progressing Goal: Mental status will improve Outcome: Not Progressing   Problem: Activity: Goal: Interest or engagement in activities will improve Outcome: Not Progressing   Problem: Health Behavior/Discharge Planning: Goal: Compliance with treatment plan for underlying cause of condition will improve Outcome: Progressing   Problem: Physical Regulation: Goal: Ability to maintain clinical measurements within normal limits will improve Outcome: Progressing   Problem: Safety: Goal: Periods of time without injury will increase Outcome: Progressing

## 2024-05-21 MED ORDER — QUETIAPINE FUMARATE ER 300 MG PO TB24
300.0000 mg | ORAL_TABLET | Freq: Every day | ORAL | Status: DC
  Administered 2024-05-21 – 2024-05-26 (×6): 300 mg via ORAL
  Filled 2024-05-21 (×7): qty 1

## 2024-05-21 NOTE — Plan of Care (Signed)
   Problem: Health Behavior/Discharge Planning: Goal: Compliance with treatment plan for underlying cause of condition will improve Outcome: Progressing   Problem: Safety: Goal: Periods of time without injury will increase Outcome: Progressing

## 2024-05-21 NOTE — Group Note (Signed)
 [facility] LCSW Group Therapy Note   Group Date: 05/21/2024 Start Time: 1300 End Time: 1400   Type of Therapy/Topic:  Group Therapy:  Balance in Life  Participation Level:  Did Not Attend   Description of Group:    This group will address the concept of balance and how it feels and looks when one is unbalanced. Patients will be encouraged to process areas in their lives that are out of balance, and identify reasons for remaining unbalanced. Facilitators will guide patients utilizing problem- solving interventions to address and correct the stressor making their life unbalanced. Understanding and applying boundaries will be explored and addressed for obtaining  and maintaining a balanced life. Patients will be encouraged to explore ways to assertively make their unbalanced needs known to significant others in their lives, using other group members and facilitator for support and feedback.  Therapeutic Goals: Patient will identify two or more emotions or situations they have that consume much of in their lives. Patient will identify signs/triggers that life has become out of balance:  Patient will identify two ways to set boundaries in order to achieve balance in their lives:  Patient will demonstrate ability to communicate their needs through discussion and/or role plays  Summary of Patient Progress:    Patient did not attend.    Therapeutic Modalities:   Cognitive Behavioral Therapy Solution-Focused Therapy Assertiveness Training   Rexene LELON Mae, LCSWA

## 2024-05-21 NOTE — Progress Notes (Signed)
 [facility] MD Progress Note  05/21/2024 3:56 PM Lauren Poole  MRN:  [id number]  33 year old female who presented to the ED via EMS following a suicide attempt by overdose on 04/30/24. She reported ingesting approximately 30 Aleve pills. After ingestion, the patient did not experience vomiting or other acute physical symptoms. She reports that initially she wanted to not wake up, but later became fearful and contacted a friend, who encouraged her to seek care in the ED. The patient reports she has struggled with chronic suicidal ideation for about 1 year.  She states things have been hard but declines to elaborate on specific triggers.   Subjective:  Chart reviewed, case discussed in multidisciplinary meeting, patient seen during rounds.   10/5: On interview today patient is noted to be laying in bed.  She continues to engage minimally in interview and displays a flat affect.  She continues to minimize mental health condition and displays poor insight.  She denies SI/HI/plan and denies hallucinations.  She declines to participate further in interview.  10/4: On interview today, patient is noted to be laying in bed and is minimally engaged in interview.  She rates depression as 2 out of 10 and anxiety is 2 out of 10 today she denies SI/HI/plan and denies hallucinations.  She continues to minimize symptoms and shows poor insight into her mental health condition.  She denies physical complaints.  She reports tolerating current medication regimen well and denies adverse effects.  She declines Seroquel  dose increase at this time stating she will refuse it if dose is increased.  Medication trials during this admission include lithium , Abilify , and Invega .  10/3: On interview today, patient is noted to be laying in bed.  She continues to display flat affect and be minimally engaged in interview.  She reports tolerating increased dose of Seroquel  well without adverse effects.  She rates depression as a 1 out  of 10 and anxiety is at a 0 out of 10 today.  She denies SI/HI/plan and denies hallucinations.  She continues to minimize symptoms and demonstrates lack of insight into her mental health condition.  Per social work team, APS report has been made and they have accepted the case.  Patient does not voice any concerns or complaints today.    10/2: On interview today, patient noted to be laying in bed, she continues to be minimally engaging during interview and is noted to have a flat affect.  She continues to lack insight into her mental health.  She required behavioral PRN yesterday after she was observed to be making gestures in the hallway and yelling at staff.  She rates depression as 3 out of 10 and anxiety at 0 out of 10 today.  She denies SI/HI/plan and denies hallucinations.  She is agreeable to Seroquel  dose increase.  10/1: On interview today, patient is noted to be laying in bed and engages minimally with the provider. She reports tolerating Seroquel  restart well without adverse effects.  Patient endorses passive SI without intent or plan.  She denies HI/plan and denies hallucinations.  Patient declines to participate in interview further.  9/30: On interview today, patient is noted to be laying in bed with a pillow covering her face.  She sits up to engage with provider.  She rates depression as a 2-3 out of 10 and anxiety as 4-5 out of 10 today.  She denies SI/HI/plan and denies hallucinations.  She is sleeping well and appetite is stable.  She states she does not like  Invega  and refused dose today, stating it has made her feel more down than even.  She requests to discontinue Invega  for this reason.  She would like to restart Seroquel , though requests to start at a lower dose than what she was previously taking which was 200 mg, stating she felt tired with 200 mg dose.  Case discussed with attending physician and social work team.  Referral to be placed to APS.  9/29: On interview today,  patient is found to be laying in bed and is minimally engaging with the provider.  She shows apathy, poor insight, and is unable to discuss her treatment goals.  Patient declined LAI.  She reports some emotional blunting with Invega  but otherwise is tolerating it well.  She states she initially found the medication beneficial in the first few days of taking it.  When asked about discharge planning, patient states she is not sure where she will go and states she does not care where she goes, [place]  When asked about support system, patient states that she does not have any friends or family.  She denies SI/HI/plan and denies hallucinations.  Patient declines to participate further in the interview.  9/28: Patient is seen for follow-up they are alert and oriented on exam.  They continue to decline LAI and indicated they will continue with the oral medication.  They continue to have poor insight into the need for medication compliance and outpatient follow-up.  They deny current SI HI and AVH.  They do not require behavioral PRNs overnight.  They have been reclusive to room.  Engaged him in discharge planning discussing options such as shelters, mother's house, or friend's house and they are reaching out to be able today it appears they will likely need to go to the shelter though.  05/13/24: Per nursing patient declined LAI this morning.  On interview she indicates she does not take any longer.  She denies SI, HI, and AVH.  She reports she is agreeable to taking the oral 3 mg dose.  She demonstrates poor insight.  Discussed her past psych records of poor medication compliance and how long-acting injectable would remove the need for her to take medication every day.  She continues to decline despite previously agreeing.  She did not require behavioral PRNs overnight they are performing ADLs.  They voiced no concerns or complaints.  Continue with some paranoia and bizarre  behavior we will continue to monitor.  05/12/24: Per follow-up they are alert and oriented. They are feeling more like themself. Still some bizarre behavior discusses that they want to fast for 7 days and are unable to explain why. Then begins discussing their current hunger. They Deny SI/HI/AVH. She does laugh inappropriately at times. Notes no dizziness this morning. Risk, benefit, and adverse effects of invega  sustenna are reviewed and pt consents to LAI. They are performing ADLS. They voice no concerns or complaints. No behavioral PRNS overnight. Act referral is being placed.   05/11/24: Per follow-up this morning they are alert and oriented.  They are pleasant and cooperative.  They continue to feel the Invega  is effective and they feel her thought processes been more linear and their mood has been more stable.  They continue to note some passive SI they deny plan or intent.  They had some dizziness this morning they are unclear on the timeframe with reference to getting the higher dose of Invega  today we will continue to monitor.  She does not appear to be internally preoccupied nor she observed to be responding to internal stimuli.  She denies HI.  Given multiple recent hospitalizations we will continue to monitor work towards setting patient up with ACT team and progress towards LAI in the coming days.  05/10/24: Patient seen for follow up. They are alert and oriented. They are pleasant and cooperative on exam. They feel that the invega  is working well and are agreeable to continued titration. They noted some passive si today without plan or intent. Disussed medication options for depression and patient indicates she is only willing to take the Invega  at this time but that she is open to an LAI. She denies HI and AVH. Discussed ACTT services given the multiple recent hospitalizations. Sleep and appetite are stable. Had an argument with another pt during group yesterday but feels that it was because the pt  was judging them. No behavioral PRNS overnight.   05/09/24: Patient seen for follow up. They are alert and oriented. They report they are not agreeable to continuing abilify . They are not agreeable to ongoing lab test required for Lithium , will discontinue both. Discussed invega  and LAI, patient is agreeable to trial of invega  with openess to transitioning to LAI. She denies SI/HI/AVH. Notes improving sleep and stable appetite.   05/08/2024: On interview today patient is noted to be sitting up in bed.  She is more engaged in interview today.  She reports she has not started Abilify  yet but is planning to take first dose this evening.  She has started lithium  and is tolerating this well.  She endorses symptoms of depression but denies persistent anxiety.  She denies current SI/HI/plan and denies hallucinations.  She notes most recent episode of passive SI was yesterday.  She does not voice any concerns or complaints today.  She discusses desire to do something productive with her life.  05/07/2024: Per nursing report patient did not come out and remained mostly to her room.  This Clinical research associate talked with the patient and her room.  Patient reports that she got Seroquel  yesterday and still thinks sleepy because of that.  She reports that she got lithium  today in the morning for the first time and would get Abilify  tonight.  Patient continues to report having suicidal ideations.  Reports that the last time she had suicidal ideations was yesterday in the evening time and she just woke up in the morning.  Still feeling depressed and rates her depression as 5 out of 10.  Patient reports that her normal self is when she rates her depression as 2 out of 10.  Continues to report that she has thoughts of shooting herself if she would have a gun.  But she does not have any access to guns.    05/06/2024:   per staff report patient has refused to take the Seroquel  at night yesterday.  Patient is still coming out and staying in  her home.  Patient on interview reports that she came suicide attempt and reports that she had 1 previous suicide attempt this year.  Patient continues to report that she is depressed.  Patient reports that it is end of road for her.  Patient reports that she does not have access to gun but if she has done she would just get it over with.  Patient remained vague but reports that she has ongoing financial, family, life stressors.  Patient reports history consistent with bipolar 1   With manic episode lasting for 1 month  but reports that these days she is not  having manic symptoms and mostly getting depressed.   9/19: On interview today patient is noted to be seated up in bed looking towards the window.  She is more engaged in interview today than she has been in recent days.   She was compliant with Seroquel  dose last night, and states she feels that it is working.  She reports tolerating medication well without adverse effects.  She is sleeping well and reports appetite as normal.  She denies SI/HI/plan and denies hallucinations.  She rates depression as a 4 out of 10 and anxiety as a 0 out of 10 today.  She does not voice any concerns or complaints today.  9/18: On interview today patient is noted to be alert and laying in bed.  She declines to communicate with provider verbally.  Patient responds to questions during interview with a head nod or head shake.  Patient declined medication last night per nursing report.  She denies SI/HI/plan and denies hallucinations via shaking her head to indicate 'no' when asked.  She declined to answer questions regarding sleep and appetite.  When asked if tolerating medication patient nods head to indicate 'yes'.  Patient declined to participate further in interview.  9/17: On interview today patient is noted to be laying in bed with hand covering her face.  She continues to be withdrawn and minimizes symptoms.  She denies current SI/HI/plan and denies hallucinations.   She states she is tolerating initiation of Seroquel  well.  Patient declines to answer further questions.  9/16: On interview, patient is noted to be laying in bed.  Per nursing report, she has primarily been isolated to her room since her arrival on the unit.  She endorses passive SI without current intent or plan.  She denies HI/plan and denies hallucinations.  She reports she is sleeping and eating well.  Interviewer discussed treatment options with patient to include medication.  Patient states she prefers to be restarted on Seroquel , which she has taken in the past with good response.  Patient does not voice any other complaints or concerns today.   Sleep: Fair  Appetite: Fair  Past Psychiatric History: see h&P Family History:  Family History  Problem Relation Age of Onset   Schizophrenia Sister    Social History:  Social History   Substance and Sexual Activity  Alcohol Use Yes   Alcohol/week: 4.0 standard drinks of alcohol   Types: 4 Glasses of wine per week     Social History   Substance and Sexual Activity  Drug Use Not Currently   Types: Marijuana    Social History   Socioeconomic History   Marital status: Single    Spouse name: Not on file   Number of children: Not on file   Years of education: Not on file   Highest education level: Not on file  Occupational History   Not on file  Tobacco Use   Smoking status: Every Day    Current packs/day: 0.50    Average packs/day: 0.5 packs/day for 6.6 years (3.3 ttl pk-yrs)    Types: Cigarettes    Start date: 10/2017   Smokeless tobacco: Never  Vaping Use   Vaping status: Never Used  Substance and Sexual Activity   Alcohol use: Yes    Alcohol/week: 4.0 standard drinks of alcohol    Types: 4 Glasses of wine per week   Drug use: Not Currently    Types: Marijuana   Sexual activity:  Yes    Birth control/protection: None  Other Topics Concern   Not on file  Social History Narrative   Not on file   Social Drivers  of Health   Financial Resource Strain: Not on file  Food Insecurity: Food Insecurity Present (04/30/2024)   Hunger Vital Sign    Worried About Running Out of Food in the Last Year: Sometimes true    Ran Out of Food in the Last Year: Sometimes true  Transportation Needs: Unmet Transportation Needs (04/30/2024)   PRAPARE - Administrator, Civil Service (Medical): Yes    Lack of Transportation (Non-Medical): Yes  Physical Activity: Not on file  Stress: Not on file  Social Connections: Not on file   Past Medical History:  Past Medical History:  Diagnosis Date   Bipolar 1 disorder (HCC)    Chlamydia    Gonorrhea     Past Surgical History:  Procedure Laterality Date   NO PAST SURGERIES      Current Medications: Current Facility-Administered Medications  Medication Dose Route Frequency Provider Last Rate Last Admin   acetaminophen  (TYLENOL ) tablet 650 mg  650 mg Oral Q6H PRN Motley-Mangrum, Jadeka A, PMHNP       alum & mag hydroxide-simeth (MAALOX/MYLANTA) 200-200-20 MG/5ML suspension 30 mL  30 mL Oral Q4H PRN Motley-Mangrum, Jadeka A, PMHNP       haloperidol  (HALDOL ) tablet 5 mg  5 mg Oral TID PRN Motley-Mangrum, Jadeka A, PMHNP       And   diphenhydrAMINE  (BENADRYL ) capsule 50 mg  50 mg Oral TID PRN Motley-Mangrum, Jadeka A, PMHNP       haloperidol  lactate (HALDOL ) injection 5 mg  5 mg Intramuscular TID PRN Motley-Mangrum, Jadeka A, PMHNP       And   diphenhydrAMINE  (BENADRYL ) injection 50 mg  50 mg Intramuscular TID PRN Motley-Mangrum, Jadeka A, PMHNP       And   LORazepam  (ATIVAN ) injection 2 mg  2 mg Intramuscular TID PRN Motley-Mangrum, Jadeka A, PMHNP       haloperidol  lactate (HALDOL ) injection 10 mg  10 mg Intramuscular TID PRN Motley-Mangrum, Jadeka A, PMHNP   10 mg at 05/17/24 1233   And   diphenhydrAMINE  (BENADRYL ) injection 50 mg  50 mg Intramuscular TID PRN Motley-Mangrum, Jadeka A, PMHNP   50 mg at 05/17/24 1233   And   LORazepam  (ATIVAN ) injection 2 mg  2  mg Intramuscular TID PRN Motley-Mangrum, Jadeka A, PMHNP   2 mg at 05/17/24 1233   magnesium  hydroxide (MILK OF MAGNESIA) suspension 30 mL  30 mL Oral Daily PRN Motley-Mangrum, Jadeka A, PMHNP       QUEtiapine  (SEROQUEL  XR) 24 hr tablet 300 mg  300 mg Oral QHS Adit Riddles L, PA-C       traZODone  (DESYREL ) tablet 50 mg  50 mg Oral QHS PRN Motley-Mangrum, Jadeka A, PMHNP   50 mg at 05/14/24 2105    Lab Results:  No results found for this or any previous visit (from the past 48 hours).   Blood Alcohol level:  Lab Results  Component Value Date   [facility] <15 04/30/2024   ETH <15 04/09/2024    Metabolic Disorder Labs: Lab Results  Component Value Date   HGBA1C 5.1 10/31/2023   MPG 99.67 10/31/2023   No results found for: PROLACTIN Lab Results  Component Value Date   CHOL 136 10/31/2023   TRIG 75 10/31/2023   HDL 37 (L) 10/31/2023   CHOLHDL 3.7 10/31/2023  VLDL 15 10/31/2023   LDLCALC 84 10/31/2023    Physical Findings: AIMS:  , ,  ,  ,    CIWA:    COWS:      Musculoskeletal: Strength & Muscle Tone: within normal limits Gait & Station: normal Assets  Assets: Communication Skills  Mental Status Exam:  General Appearance: Casual Eye contact: Minimal Speech: Normal rate Speech volume: Decreased Mood: Depressed Affect: Flat Thought process: Linear Descriptions of associations: Intact Orientation: Full (time, place and person) he Thought content: WDL Hallucinations: None Ideas of reference: None Suicidal thoughts: No Homicidal thoughts: No Memory: Immediate fair, recent fair, remote fair Judgment: Poor Insight: Poor Concentration: Fair Attention span: Fair Recall: [employer] of knowledge: Fair Language: Fair Psychomotor activity: Normal  Physical Exam: Physical Exam Vitals and nursing note reviewed.  HENT:     Head: Atraumatic.  Eyes:     Extraocular Movements: Extraocular movements intact.  Pulmonary:     Effort: Pulmonary effort is normal.   Neurological:     Mental Status: She is alert and oriented to person, place, and time.    ROS Blood pressure 102/79, pulse 76, temperature 98.3 F (36.8 C), resp. rate 18, height 5' 4 (1.626 m), weight 109.3 kg, SpO2 99%. Body mass index is 41.37 kg/m.  Diagnosis: Principal Problem:   Bipolar 1 disorder (HCC)   PLAN: Safety and Monitoring:  -- Voluntary admission to inpatient psychiatric unit for safety, stabilization and treatment  -- Daily contact with patient to assess and evaluate symptoms and progress in treatment  -- Patient's case to be discussed in multi-disciplinary team meeting  -- Observation Level : q15 minute checks  -- Vital signs:  q12 hours  -- Precautions: suicide, elopement, and assault -- Encouraged patient to participate in unit milieu and in scheduled group therapies  2. Psychiatric Diagnoses and Treatment:  Bipolar disorder  Reviewed case with attending Dr. Donnelly who was agreeable with proceeding with LAI. Reviewed dosing with pharmacy who agreed.  Patient subsequently declined LAI. D/C lithium  300 mg b.I.d. and Abilify  5 mg at bedtime.  Labs ordered for thyroid  panel and vitamin D .  Discontinued Invega  per patient request Increase Seroquel  XR to 300 mg nightly               The risks/benefits/side-effects/alternatives to this medication were discussed in detail with the patient and time was given for questions. The patient consents to medication trial. -- Metabolic profile and EKG monitoring obtained while on an atypical antipsychotic  -- Encouraged patient to participate in unit milieu and in scheduled group therapies                    3. Medical Issues Being Addressed:  No medical issues identified at this time.   4. Discharge Planning:  Patient to be referred for ACT services.   APS has accepted case  -- Social work and case management to assist with discharge planning and identification of hospital follow-up needs prior to discharge  --  Estimated LOS: 1-2 weeks  Camelia LITTIE Lukes, PA-C 05/21/2024, 3:56 PM

## 2024-05-21 NOTE — Progress Notes (Signed)
   05/21/24 0956  Psych Admission Type (Psych Patients Only)  Admission Status Voluntary  Psychosocial Assessment  Patient Complaints None  Eye Contact Fair  Facial Expression Flat  Affect Flat  Speech Soft  Interaction Minimal  Motor Activity Slow  Appearance/Hygiene Unremarkable  Behavior Characteristics Cooperative  Mood Pleasant  Thought Process  Coherency WDL  Content WDL  Delusions None reported or observed  Perception WDL  Hallucination None reported or observed  Judgment Poor  Confusion None  Danger to Self  Current suicidal ideation? Denies  Self-Injurious Behavior No self-injurious ideation or behavior indicators observed or expressed   Agreement Not to Harm Self Yes  Description of Agreement verbal  Danger to Others  Danger to Others None reported or observed

## 2024-05-21 NOTE — Progress Notes (Signed)
 Patient did not report any complaint this shift.  Q 15 minutes for safety per unit protocol. Pt. remains safe on the unit and is cooperative. No SI/HI reported. Pt. was pleasant. Pt. was med compliant. Pt. contracted verbally for safety.

## 2024-05-21 NOTE — Group Note (Signed)
 Date:  05/21/2024 Time:  2:01 PM  Group Topic/Focus:  Goals Group:   The focus of this group is to help patients establish daily goals to achieve during treatment and discuss how the patient can incorporate goal setting into their daily lives to aide in recovery.  Participation Level:  Did Not Attend  Lauren Poole A Lauren Poole 05/21/2024, 2:01 PM

## 2024-05-21 NOTE — Group Note (Signed)
 Date:  05/21/2024 Time:  10:06 PM  Group Topic/Focus:  Wrap-Up Group:   The focus of this group is to help patients review their daily goal of treatment and discuss progress on daily workbooks.    Participation Level:  Minimal  Participation Quality:  Appropriate and Attentive  Affect:  Appropriate  Cognitive:  Appropriate  Insight: Appropriate and Good  Engagement in Group:  Engaged  Modes of Intervention:  Confrontation  Additional Comments:     Arlester CHRISTELLA Servant 05/21/2024, 10:06 PM

## 2024-05-21 NOTE — Plan of Care (Signed)
 Lauren Poole is a 33 y.o. female patient. No diagnosis found. Past Medical History:  Diagnosis Date   Bipolar 1 disorder (HCC)    Chlamydia    Gonorrhea    Current Facility-Administered Medications  Medication Dose Route Frequency Provider Last Rate Last Admin   acetaminophen  (TYLENOL ) tablet 650 mg  650 mg Oral Q6H PRN Motley-Mangrum, Jadeka A, PMHNP       alum & mag hydroxide-simeth (MAALOX/MYLANTA) 200-200-20 MG/5ML suspension 30 mL  30 mL Oral Q4H PRN Motley-Mangrum, Jadeka A, PMHNP       haloperidol  (HALDOL ) tablet 5 mg  5 mg Oral TID PRN Motley-Mangrum, Jadeka A, PMHNP       And   diphenhydrAMINE  (BENADRYL ) capsule 50 mg  50 mg Oral TID PRN Motley-Mangrum, Jadeka A, PMHNP       haloperidol  lactate (HALDOL ) injection 5 mg  5 mg Intramuscular TID PRN Motley-Mangrum, Jadeka A, PMHNP       And   diphenhydrAMINE  (BENADRYL ) injection 50 mg  50 mg Intramuscular TID PRN Motley-Mangrum, Jadeka A, PMHNP       And   LORazepam  (ATIVAN ) injection 2 mg  2 mg Intramuscular TID PRN Motley-Mangrum, Jadeka A, PMHNP       haloperidol  lactate (HALDOL ) injection 10 mg  10 mg Intramuscular TID PRN Motley-Mangrum, Jadeka A, PMHNP   10 mg at 05/17/24 1233   And   diphenhydrAMINE  (BENADRYL ) injection 50 mg  50 mg Intramuscular TID PRN Motley-Mangrum, Jadeka A, PMHNP   50 mg at 05/17/24 1233   And   LORazepam  (ATIVAN ) injection 2 mg  2 mg Intramuscular TID PRN Motley-Mangrum, Jadeka A, PMHNP   2 mg at 05/17/24 1233   magnesium  hydroxide (MILK OF MAGNESIA) suspension 30 mL  30 mL Oral Daily PRN Motley-Mangrum, Jadeka A, PMHNP       QUEtiapine  (SEROQUEL  XR) 24 hr tablet 200 mg  200 mg Oral QHS Hunter, Crystal L, PA-C   200 mg at 05/20/24 2110   traZODone  (DESYREL ) tablet 50 mg  50 mg Oral QHS PRN Motley-Mangrum, Jadeka A, PMHNP   50 mg at 05/14/24 2105   No Known Allergies Principal Problem:   Bipolar 1 disorder (HCC)  Blood pressure 125/82, pulse 82, temperature (!) 97.2 F (36.2 C), resp. rate  16, height 5' 4 (1.626 m), weight 109.3 kg, SpO2 100%.  Subjective Objective Assessment & Plan  Lauren Poole B Lauren Poole 05/21/2024

## 2024-05-22 NOTE — Group Note (Signed)
 Date:  05/22/2024 Time:  9:02 PM  Group Topic/Focus:  Self Care:   The focus of this group is to help patients understand the importance of self-care in order to improve or restore emotional, physical, spiritual, interpersonal, and financial health.    Participation Level:  Active  Participation Quality:  Appropriate  Affect:  Appropriate  Cognitive:  Appropriate  Insight: Appropriate  Engagement in Group:  Engaged  Modes of Intervention:  Discussion  Additional Comments:    Leigh VEAR Pais 05/22/2024, 9:02 PM

## 2024-05-22 NOTE — Progress Notes (Incomplete)
 [facility] MD Progress Note  05/22/2024 9:57 PM Clydie Dillen  MRN:  [id number]  33 year old female who presented to the ED via EMS following a suicide attempt by overdose on 04/30/24. She reported ingesting approximately 30 Aleve pills. After ingestion, the patient did not experience vomiting or other acute physical symptoms. She reports that initially she wanted to not wake up, but later became fearful and contacted a friend, who encouraged her to seek care in the ED. The patient reports she has struggled with chronic suicidal ideation for about 1 year.  She states things have been hard but declines to elaborate on specific triggers.   Subjective:  Chart reviewed, case discussed in multidisciplinary meeting, patient seen during rounds.   10/6: On interview, patient is noted to be laying in bed.  She continues to be guarded and engages minimally with provider.  She continues to display poor insight and flat affect.  She does not actively engage in discussion of her treatment plan and is unable to discuss her treatment goals.  She has been compliant with Seroquel  XR 300 mg at bedtime and denies adverse effects.  She has not required any behavioral PRNs since 05/17/2024.  She does not voice any physical complaints at this time.  Provider encouraged patient to participate in group therapies and milieu activities. Social work team to make [facility] referral.   10/5: On interview today patient is noted to be laying in bed.  She continues to engage minimally in interview and displays a flat affect.  She continues to minimize mental health condition and displays poor insight.  She denies SI/HI/plan and denies hallucinations.  She declines to participate further in interview.  10/4: On interview today, patient is noted to be laying in bed and is minimally engaged in interview.  She rates depression as 2 out of 10 and anxiety is 2 out of 10 today she denies SI/HI/plan and denies hallucinations.  She continues to  minimize symptoms and shows poor insight into her mental health condition.  She denies physical complaints.  She reports tolerating current medication regimen well and denies adverse effects.  She declines Seroquel  dose increase at this time stating she will refuse it if dose is increased.  Medication trials during this admission include lithium , Abilify , and Invega .  10/3: On interview today, patient is noted to be laying in bed.  She continues to display flat affect and be minimally engaged in interview.  She reports tolerating increased dose of Seroquel  well without adverse effects.  She rates depression as a 1 out of 10 and anxiety is at a 0 out of 10 today.  She denies SI/HI/plan and denies hallucinations.  She continues to minimize symptoms and demonstrates lack of insight into her mental health condition.  Per social work team, [facility]PS report has been made and they have accepted the case.  Patient does not voice any concerns or complaints today.    10/2: On interview today, patient noted to be laying in bed, she continues to be minimally engaging during interview and is noted to have a flat affect.  She continues to lack insight into her mental health.  She required behavioral PRN yesterday after she was observed to be making gestures in the hallway and yelling at staff.  She rates depression as 3 out of 10 and anxiety at 0 out of 10 today.  She denies SI/HI/plan and denies hallucinations.  She is agreeable to Seroquel  dose increase.  10/1: On interview today, patient is noted to be  laying in bed and engages minimally with the provider. She reports tolerating Seroquel  restart well without adverse effects.  Patient endorses passive SI without intent or plan.  She denies HI/plan and denies hallucinations.  Patient declines to participate in interview further.  9/30: On interview today, patient is noted to be laying in bed with a pillow covering her face.  She sits up to engage with provider.  She rates  depression as a 2-3 out of 10 and anxiety as 4-5 out of 10 today.  She denies SI/HI/plan and denies hallucinations.  She is sleeping well and appetite is stable.  She states she does not like Invega  and refused dose today, stating it has made her feel more down than even.  She requests to discontinue Invega  for this reason.  She would like to restart Seroquel , though requests to start at a lower dose than what she was previously taking which was 200 mg, stating she felt tired with 200 mg dose.  Case discussed with attending physician and social work team.  Referral to be placed to APS.  9/29: On interview today, patient is found to be laying in bed and is minimally engaging with the provider.  She shows apathy, poor insight, and is unable to discuss her treatment goals.  Patient declined LAI.  She reports some emotional blunting with Invega  but otherwise is tolerating it well.  She states she initially found the medication beneficial in the first few days of taking it.  When asked about discharge planning, patient states she is not sure where she will go and states she does not care where she goes, commenting [place]  When asked about support system, patient states that she does not have any friends or family.  She denies SI/HI/plan and denies hallucinations.  Patient declines to participate further in the interview.  9/28: Patient is seen for follow-up they are alert and oriented on exam.  They continue to decline LAI and indicated they will continue with the oral medication.  They continue to have poor insight into the need for medication compliance and outpatient follow-up.  They deny current SI HI and AVH.  They do not require behavioral PRNs overnight.  They have been reclusive to room.  Engaged him in discharge planning discussing options such as shelters, mother's house, or friend's house and they are reaching out to be able today it appears they will likely need to go to  the shelter though.  05/13/24: Per nursing patient declined LAI this morning.  On interview she indicates she does not take any longer.  She denies SI, HI, and AVH.  She reports she is agreeable to taking the oral 3 mg dose.  She demonstrates poor insight.  Discussed her past psych records of poor medication compliance and how long-acting injectable would remove the need for her to take medication every day.  She continues to decline despite previously agreeing.  She did not require behavioral PRNs overnight they are performing ADLs.  They voiced no concerns or complaints.  Continue with some paranoia and bizarre behavior we will continue to monitor.  05/12/24: Per follow-up they are alert and oriented. They are feeling more like themself. Still some bizarre behavior discusses that they want to fast for 7 days and are unable to explain why. Then begins discussing their current hunger. They Deny SI/HI/AVH. She does laugh inappropriately at times. Notes no dizziness this morning. Risk, benefit, and adverse effects of invega  sustenna  are reviewed and pt consents to LAI. They are performing ADLS. They voice no concerns or complaints. No behavioral PRNS overnight. Act referral is being placed.   05/11/24: Per follow-up this morning they are alert and oriented.  They are pleasant and cooperative.  They continue to feel the Invega  is effective and they feel her thought processes been more linear and their mood has been more stable.  They continue to note some passive SI they deny plan or intent.  They had some dizziness this morning they are unclear on the timeframe with reference to getting the higher dose of Invega  today we will continue to monitor.  She does not appear to be internally preoccupied nor she observed to be responding to internal stimuli.  She denies HI.  Given multiple recent hospitalizations we will continue to monitor work towards setting patient up with ACT team and progress towards LAI in the coming  days.  05/10/24: Patient seen for follow up. They are alert and oriented. They are pleasant and cooperative on exam. They feel that the invega  is working well and are agreeable to continued titration. They noted some passive si today without plan or intent. Disussed medication options for depression and patient indicates she is only willing to take the Invega  at this time but that she is open to an LAI. She denies HI and AVH. Discussed ACTT services given the multiple recent hospitalizations. Sleep and appetite are stable. Had an argument with another pt during group yesterday but feels that it was because the pt was judging them. No behavioral PRNS overnight.   05/09/24: Patient seen for follow up. They are alert and oriented. They report they are not agreeable to continuing abilify . They are not agreeable to ongoing lab test required for Lithium , will discontinue both. Discussed invega  and LAI, patient is agreeable to trial of invega  with openess to transitioning to LAI. She denies SI/HI/AVH. Notes improving sleep and stable appetite.   05/08/2024: On interview today patient is noted to be sitting up in bed.  She is more engaged in interview today.  She reports she has not started Abilify  yet but is planning to take first dose this evening.  She has started lithium  and is tolerating this well.  She endorses symptoms of depression but denies persistent anxiety.  She denies current SI/HI/plan and denies hallucinations.  She notes most recent episode of passive SI was yesterday.  She does not voice any concerns or complaints today.  She discusses desire to do something productive with her life.  05/07/2024: Per nursing report patient did not come out and remained mostly to her room.  This Clinical research associate talked with the patient and her room.  Patient reports that she got Seroquel  yesterday and still thinks sleepy because of that.  She reports that she got lithium  today in the morning for the first time and would get  Abilify  tonight.  Patient continues to report having suicidal ideations.  Reports that the last time she had suicidal ideations was yesterday in the evening time and she just woke up in the morning.  Still feeling depressed and rates her depression as 5 out of 10.  Patient reports that her normal self is when she rates her depression as 2 out of 10.  Continues to report that she has thoughts of shooting herself if she would have a gun.  But she does not have any access to guns.    05/06/2024:   per staff report patient has refused to take  the Seroquel  at night yesterday.  Patient is still coming out and staying in her home.  Patient on interview reports that she came suicide attempt and reports that she had 1 previous suicide attempt this year.  Patient continues to report that she is depressed.  Patient reports that it is end of road for her.  Patient reports that she does not have access to gun but if she has done she would just get it over with.  Patient remained vague but reports that she has ongoing financial, family, life stressors.  Patient reports history consistent with bipolar 1   With manic episode lasting for 1 month but reports that these days she is not  having manic symptoms and mostly getting depressed.   9/19: On interview today patient is noted to be seated up in bed looking towards the window.  She is more engaged in interview today than she has been in recent days.   She was compliant with Seroquel  dose last night, and states she feels that it is working.  She reports tolerating medication well without adverse effects.  She is sleeping well and reports appetite as normal.  She denies SI/HI/plan and denies hallucinations.  She rates depression as a 4 out of 10 and anxiety as a 0 out of 10 today.  She does not voice any concerns or complaints today.  9/18: On interview today patient is noted to be alert and laying in bed.  She declines to communicate with provider verbally.  Patient  responds to questions during interview with a head nod or head shake.  Patient declined medication last night per nursing report.  She denies SI/HI/plan and denies hallucinations via shaking her head to indicate 'no' when asked.  She declined to answer questions regarding sleep and appetite.  When asked if tolerating medication patient nods head to indicate 'yes'.  Patient declined to participate further in interview.  9/17: On interview today patient is noted to be laying in bed with hand covering her face.  She continues to be withdrawn and minimizes symptoms.  She denies current SI/HI/plan and denies hallucinations.  She states she is tolerating initiation of Seroquel  well.  Patient declines to answer further questions.  9/16: On interview, patient is noted to be laying in bed.  Per nursing report, she has primarily been isolated to her room since her arrival on the unit.  She endorses passive SI without current intent or plan.  She denies HI/plan and denies hallucinations.  She reports she is sleeping and eating well.  Interviewer discussed treatment options with patient to include medication.  Patient states she prefers to be restarted on Seroquel , which she has taken in the past with good response.  Patient does not voice any other complaints or concerns today.   Sleep: Fair  Appetite: Fair  Past Psychiatric History: see h&P Family History:  Family History  Problem Relation Age of Onset   Schizophrenia Sister    Social History:  Social History   Substance and Sexual Activity  Alcohol Use Yes   Alcohol/week: 4.0 standard drinks of alcohol   Types: 4 Glasses of wine per week     Social History   Substance and Sexual Activity  Drug Use Not Currently   Types: Marijuana    Social History   Socioeconomic History   Marital status: Single    Spouse name: Not on file   Number of children: Not on file   Years of education: Not on file  Highest education level: Not on file   Occupational History   Not on file  Tobacco Use   Smoking status: Every Day    Current packs/day: 0.50    Average packs/day: 0.5 packs/day for 6.6 years (3.3 ttl pk-yrs)    Types: Cigarettes    Start date: 10/2017   Smokeless tobacco: Never  Vaping Use   Vaping status: Never Used  Substance and Sexual Activity   Alcohol use: Yes    Alcohol/week: 4.0 standard drinks of alcohol    Types: 4 Glasses of wine per week   Drug use: Not Currently    Types: Marijuana   Sexual activity: Yes    Birth control/protection: None  Other Topics Concern   Not on file  Social History Narrative   Not on file   Social Drivers of Health   Financial Resource Strain: Not on file  Food Insecurity: Food Insecurity Present (04/30/2024)   Hunger Vital Sign    Worried About Running Out of Food in the Last Year: Sometimes true    Ran Out of Food in the Last Year: Sometimes true  Transportation Needs: Unmet Transportation Needs (04/30/2024)   PRAPARE - Administrator, Civil Service (Medical): Yes    Lack of Transportation (Non-Medical): Yes  Physical Activity: Not on file  Stress: Not on file  Social Connections: Not on file   Past Medical History:  Past Medical History:  Diagnosis Date   Bipolar 1 disorder (HCC)    Chlamydia    Gonorrhea     Past Surgical History:  Procedure Laterality Date   NO PAST SURGERIES      Current Medications: Current Facility-Administered Medications  Medication Dose Route Frequency Provider Last Rate Last Admin   acetaminophen  (TYLENOL ) tablet 650 mg  650 mg Oral Q6H PRN Motley-Mangrum, Jadeka A, PMHNP       alum & mag hydroxide-simeth (MAALOX/MYLANTA) 200-200-20 MG/5ML suspension 30 mL  30 mL Oral Q4H PRN Motley-Mangrum, Jadeka A, PMHNP       haloperidol  (HALDOL ) tablet 5 mg  5 mg Oral TID PRN Motley-Mangrum, Jadeka A, PMHNP       And   diphenhydrAMINE  (BENADRYL ) capsule 50 mg  50 mg Oral TID PRN Motley-Mangrum, Jadeka A, PMHNP       haloperidol   lactate (HALDOL ) injection 5 mg  5 mg Intramuscular TID PRN Motley-Mangrum, Jadeka A, PMHNP       And   diphenhydrAMINE  (BENADRYL ) injection 50 mg  50 mg Intramuscular TID PRN Motley-Mangrum, Jadeka A, PMHNP       And   LORazepam  (ATIVAN ) injection 2 mg  2 mg Intramuscular TID PRN Motley-Mangrum, Jadeka A, PMHNP       haloperidol  lactate (HALDOL ) injection 10 mg  10 mg Intramuscular TID PRN Motley-Mangrum, Jadeka A, PMHNP   10 mg at 05/17/24 1233   And   diphenhydrAMINE  (BENADRYL ) injection 50 mg  50 mg Intramuscular TID PRN Motley-Mangrum, Jadeka A, PMHNP   50 mg at 05/17/24 1233   And   LORazepam  (ATIVAN ) injection 2 mg  2 mg Intramuscular TID PRN Motley-Mangrum, Jadeka A, PMHNP   2 mg at 05/17/24 1233   magnesium  hydroxide (MILK OF MAGNESIA) suspension 30 mL  30 mL Oral Daily PRN Motley-Mangrum, Jadeka A, PMHNP       QUEtiapine  (SEROQUEL  XR) 24 hr tablet 300 mg  300 mg Oral QHS Marzetta Lanza L, PA-C   300 mg at 05/22/24 2135   traZODone  (DESYREL ) tablet 50 mg  50 mg  Oral QHS PRN Motley-Mangrum, Jadeka A, PMHNP   50 mg at 05/14/24 2105    Lab Results:  No results found for this or any previous visit (from the past 48 hours).   Blood Alcohol level:  Lab Results  Component Value Date   [facility] <15 04/30/2024   ETH <15 04/09/2024    Metabolic Disorder Labs: Lab Results  Component Value Date   HGBA1C 5.1 10/31/2023   MPG 99.67 10/31/2023   No results found for: PROLACTIN Lab Results  Component Value Date   CHOL 136 10/31/2023   TRIG 75 10/31/2023   HDL 37 (L) 10/31/2023   CHOLHDL 3.7 10/31/2023   VLDL 15 10/31/2023   LDLCALC 84 10/31/2023    Physical Findings: AIMS:  , ,  ,  ,    CIWA:    COWS:      Musculoskeletal: Strength & Muscle Tone: within normal limits Gait & Station: normal Assets  Assets: Communication Skills  Mental Status Exam:  General Appearance: Casual Eye contact: Minimal Speech: Normal rate Speech volume: Decreased Mood: Depressed Affect:  Flat Thought process: Linear Descriptions of associations: Intact Orientation: Full (time, place and person) he Thought content: WDL Hallucinations: None Ideas of reference: None Suicidal thoughts: No Homicidal thoughts: No Memory: Immediate fair, recent fair, remote fair Judgment: Poor Insight: Poor Concentration: Fair Attention span: Fair Recall: [employer] of knowledge: Fair Language: Fair Psychomotor activity: Normal  Physical Exam: Physical Exam Vitals and nursing note reviewed.  HENT:     Head: Atraumatic.  Eyes:     Extraocular Movements: Extraocular movements intact.  Pulmonary:     Effort: Pulmonary effort is normal.  Neurological:     Mental Status: She is alert and oriented to person, place, and time.    ROS Blood pressure 110/85, pulse 88, temperature 98.1 F (36.7 C), resp. rate 18, height 5' 4 (1.626 m), weight 109.3 kg, SpO2 98%. Body mass index is 41.37 kg/m.  Diagnosis: Principal Problem:   Bipolar 1 disorder (HCC)   PLAN: Safety and Monitoring:  -- Voluntary admission to inpatient psychiatric unit for safety, stabilization and treatment  -- Daily contact with patient to assess and evaluate symptoms and progress in treatment  -- Patient's case to be discussed in multi-disciplinary team meeting  -- Observation Level : q15 minute checks  -- Vital signs:  q12 hours  -- Precautions: suicide, elopement, and assault -- Encouraged patient to participate in unit milieu and in scheduled group therapies  2. Psychiatric Diagnoses and Treatment:  Bipolar disorder  Reviewed case with attending Dr. Donnelly who was agreeable with proceeding with LAI. Reviewed dosing with pharmacy who agreed.  Patient subsequently declined LAI. D/C lithium  300 mg b.I.d. and Abilify  5 mg at bedtime.  Labs ordered for thyroid  panel and vitamin D .  Discontinued Invega  per patient request Continue Seroquel  XR 300 mg nightly               The  risks/benefits/side-effects/alternatives to this medication were discussed in detail with the patient and time was given for questions. The patient consents to medication trial. -- Metabolic profile and EKG monitoring obtained while on an atypical antipsychotic  -- Encouraged patient to participate in unit milieu and in scheduled group therapies                    3. Medical Issues Being Addressed:  No medical issues identified at this time.   4. Discharge Planning:  Patient to be referred for ACT services.  [facility] has accepted case  -- Social work and case management to assist with discharge planning and identification of hospital follow-up needs prior to discharge  -- Estimated LOS: 1-2 weeks  Camelia LITTIE Lukes, PA-C 05/22/2024, 9:57 PM

## 2024-05-22 NOTE — BH IP Treatment Plan (Signed)
 Interdisciplinary Treatment and Diagnostic Plan Update  05/22/2024 Time of Session: 14:00 Lauren Poole MRN: [id number]  Principal Diagnosis: Bipolar 1 disorder (HCC)  Secondary Diagnoses: Principal Problem:   Bipolar 1 disorder (HCC)   Current Medications:  Current Facility-Administered Medications  Medication Dose Route Frequency Provider Last Rate Last Admin   acetaminophen  (TYLENOL ) tablet 650 mg  650 mg Oral Q6H PRN Motley-Mangrum, Jadeka A, PMHNP       alum & mag hydroxide-simeth (MAALOX/MYLANTA) 200-200-20 MG/5ML suspension 30 mL  30 mL Oral Q4H PRN Motley-Mangrum, Jadeka A, PMHNP       haloperidol  (HALDOL ) tablet 5 mg  5 mg Oral TID PRN Motley-Mangrum, Jadeka A, PMHNP       And   diphenhydrAMINE  (BENADRYL ) capsule 50 mg  50 mg Oral TID PRN Motley-Mangrum, Jadeka A, PMHNP       haloperidol  lactate (HALDOL ) injection 5 mg  5 mg Intramuscular TID PRN Motley-Mangrum, Jadeka A, PMHNP       And   diphenhydrAMINE  (BENADRYL ) injection 50 mg  50 mg Intramuscular TID PRN Motley-Mangrum, Jadeka A, PMHNP       And   LORazepam  (ATIVAN ) injection 2 mg  2 mg Intramuscular TID PRN Motley-Mangrum, Jadeka A, PMHNP       haloperidol  lactate (HALDOL ) injection 10 mg  10 mg Intramuscular TID PRN Motley-Mangrum, Jadeka A, PMHNP   10 mg at 05/17/24 1233   And   diphenhydrAMINE  (BENADRYL ) injection 50 mg  50 mg Intramuscular TID PRN Motley-Mangrum, Jadeka A, PMHNP   50 mg at 05/17/24 1233   And   LORazepam  (ATIVAN ) injection 2 mg  2 mg Intramuscular TID PRN Motley-Mangrum, Jadeka A, PMHNP   2 mg at 05/17/24 1233   magnesium  hydroxide (MILK OF MAGNESIA) suspension 30 mL  30 mL Oral Daily PRN Motley-Mangrum, Jadeka A, PMHNP       QUEtiapine  (SEROQUEL  XR) 24 hr tablet 300 mg  300 mg Oral QHS Hunter, Crystal L, PA-C   300 mg at 05/21/24 2116   traZODone  (DESYREL ) tablet 50 mg  50 mg Oral QHS PRN Motley-Mangrum, Jadeka A, PMHNP   50 mg at 05/14/24 2105   PTA Medications: Medications Prior to  Admission  Medication Sig Dispense Refill Last Dose/Taking   cholecalciferol  (CHOLECALCIFEROL ) 25 MCG tablet Take 1 tablet (1,000 Units total) by mouth daily. (Patient taking differently: Take 1,000 Units by mouth every 3 (three) days.) 30 tablet 0    naproxen sodium (ALEVE) 220 MG tablet Take 440 mg by mouth daily as needed (pain).      traZODone  (DESYREL ) 100 MG tablet Take 1 tablet (100 mg total) by mouth at bedtime. (Patient not taking: Reported on 04/30/2024) 30 tablet 0     Patient Stressors: Financial difficulties    Patient Strengths: Manufacturing systems engineer   Treatment Modalities: Medication Management, Group therapy, Case management,  1 to 1 session with clinician, Psychoeducation, Recreational therapy.   Physician Treatment Plan for Primary Diagnosis: Bipolar 1 disorder (HCC) Long Term Goal(s): Improvement in symptoms so as ready for discharge   Short Term Goals: Ability to identify changes in lifestyle to reduce recurrence of condition will improve Ability to verbalize feelings will improve Ability to disclose and discuss suicidal ideas Ability to identify and develop effective coping behaviors will improve  Medication Management: Evaluate patient's response, side effects, and tolerance of medication regimen.  Therapeutic Interventions: 1 to 1 sessions, Unit Group sessions and Medication administration.  Evaluation of Outcomes: Not Progressing  Physician Treatment Plan for Secondary Diagnosis: Principal  Problem:   Bipolar 1 disorder (HCC)  Long Term Goal(s): Improvement in symptoms so as ready for discharge   Short Term Goals: Ability to identify changes in lifestyle to reduce recurrence of condition will improve Ability to verbalize feelings will improve Ability to disclose and discuss suicidal ideas Ability to identify and develop effective coping behaviors will improve     Medication Management: Evaluate patient's response, side effects, and tolerance of medication  regimen.  Therapeutic Interventions: 1 to 1 sessions, Unit Group sessions and Medication administration.  Evaluation of Outcomes: Not Progressing   RN Treatment Plan for Primary Diagnosis: Bipolar 1 disorder (HCC) Long Term Goal(s): Knowledge of disease and therapeutic regimen to maintain health will improve  Short Term Goals: Ability to remain free from injury will improve, Ability to verbalize frustration and anger appropriately will improve, Ability to demonstrate self-control, Ability to participate in decision making will improve, Ability to verbalize feelings will improve, Ability to disclose and discuss suicidal ideas, Ability to identify and develop effective coping behaviors will improve, and Compliance with prescribed medications will improve  Medication Management: RN will administer medications as ordered by provider, will assess and evaluate patient's response and provide education to patient for prescribed medication. RN will report any adverse and/or side effects to prescribing provider.  Therapeutic Interventions: 1 on 1 counseling sessions, Psychoeducation, Medication administration, Evaluate responses to treatment, Monitor vital signs and CBGs as ordered, Perform/monitor CIWA, COWS, AIMS and Fall Risk screenings as ordered, Perform wound care treatments as ordered.  Evaluation of Outcomes: Not Progressing   LCSW Treatment Plan for Primary Diagnosis: Bipolar 1 disorder (HCC) Long Term Goal(s): Safe transition to appropriate next level of care at discharge, Engage patient in therapeutic group addressing interpersonal concerns.  Short Term Goals: Engage patient in aftercare planning with referrals and resources, Increase social support, Increase ability to appropriately verbalize feelings, Increase emotional regulation, Facilitate acceptance of mental health diagnosis and concerns, Facilitate patient progression through stages of change regarding substance use diagnoses and  concerns, Identify triggers associated with mental health/substance abuse issues, and Increase skills for wellness and recovery  Therapeutic Interventions: Assess for all discharge needs, 1 to 1 time with Social worker, Explore available resources and support systems, Assess for adequacy in community support network, Educate family and significant other(s) on suicide prevention, Complete Psychosocial Assessment, Interpersonal group therapy.  Evaluation of Outcomes: Not Progressing   Progress in Treatment: Attending groups: No. Participating in groups: No. Taking medication as prescribed: No. Toleration medication: No. Family/Significant other contact made: No, will contact:  if given permission.  Patient understands diagnosis: Yes. Discussing patient identified problems/goals with staff: Yes. Medical problems stabilized or resolved: Yes. Denies suicidal/homicidal ideation: Yes. Issues/concerns per patient self-inventory: No. Other: none.  New problem(s) identified: No, Describe:  none identified. Update 05/07/24: No changes at this time. Update 05/12/24: No changes at this time. Update 05/17/24: No changes at this time. Update 05/22/24: No changes at this time.    New Short Term/Long Term Goal(s): elimination of symptoms of psychosis, medication management for mood stabilization; elimination of SI thoughts; development of comprehensive mental wellness/sobriety plan. Update 05/07/24: No changes at this time. Update 05/12/24: No changes at this time. Update 05/17/24: No changes at this time. Update 05/22/24: No changes at this time.   Patient Goals: To go home.   Update 05/07/24: No changes at this time. Update 05/12/24: No changes at this time. Update 05/17/24: No changes at this time. Update 05/22/24: No changes at this time.  Discharge Plan or Barriers: CSW will assist pt with development of an appropriate aftercare/discharge plan.   Update 05/07/24: No changes at this time. Update 05/12/24: No  changes at this time. Update 05/17/24: Pt has stopped taking her medication and stopped attending group. She had to receive agitation protocol today due to aggressive behavior on unit. Update 05/22/24: APS report has been made due to concerns of pt being able to care for herself due to poor judgement.     Reason for Continuation of Hospitalization: Aggression Medication stabilization   Estimated Length of Stay: 1-7 days  Update 05/07/24: No changes at this time. Update 05/12/24: TBD. Update 05/17/24: TBD Update 05/22/24: TBD  Last 3 [place] Suicide Severity Risk Score: Flowsheet Row Admission (Current) from 04/30/2024 in [facility]PATIENT BE[facility] MED[facility]INE Most recent reading at 04/30/2024  6:00 PM ED from 04/30/2024 in [facility] Emergency Department at [facility] Most recent reading at 04/30/2024  1:04 AM Admission (Discharged) from 04/09/2024 in [facility] INPATIENT BEHAVIORAL MEDICINE Most recent reading at 04/09/2024  6:00 PM  C-SSRS RISK CATEGORY High Risk High Risk Low Risk    Last PHQ 2/9 Scores:     No data to display          Scribe for Treatment Team: Nadara JONELLE Fam, KEN 05/22/2024 4:33 PM

## 2024-05-22 NOTE — Plan of Care (Signed)
   Problem: Education: Goal: Knowledge of [facility] Education information/materials will improve Outcome: Progressing Goal: Emotional status will improve Outcome: Progressing Goal: Mental status will improve Outcome: Progressing Goal: Verbalization of understanding the information provided will improve Outcome: Progressing

## 2024-05-22 NOTE — Group Note (Signed)
 Recreation Therapy Group Note   Group Topic:Other  Group Date: 05/22/2024 Start Time: 1550 End Time: 1635 Facilitators: Celestia Jeoffrey FORBES ARTICE, CTRS Location: Craft Room  Activity Description/Intervention: Therapeutic Drumming. Patients with peers and staff were given the opportunity to engage in a leader facilitated [facility] Group Empowerment Drumming Circle with staff from the [employer], in partnership with [employer]. Teaching laboratory technician and trained [employer], Norleen Mon leading with LRT observing and documenting intervention and pt response. This evidenced-based practice targets 7 areas of health and wellbeing in the human experience including: stress-reduction, exercise, self-expression, camaraderie/support, nurturing, spirituality, and music-making (leisure).    Goal Area(s) Addresses:  Patient will engage in pro-social way in music group.  Patient will follow directions of drum leader on the first prompt. Patient will demonstrate no behavioral issues during group.  Patient will identify if a reduction in stress level occurs as a result of participation in therapeutic drum circle.     Affect/Mood: N/A   Participation Level: Did not attend    Clinical Observations/Individualized Feedback: Patient did not attend group.   Plan: Continue to engage patient in RT group sessions 2-3x/week.   Jeoffrey FORBES Celestia, LRT, CTRS 05/22/2024 4:43 PM

## 2024-05-22 NOTE — Group Note (Signed)
 Recreation Therapy Group Note   Group Topic:General Recreation  Group Date: 05/22/2024 Start Time: 1045 End Time: 1130 Facilitators: Celestia Jeoffrey BRAVO, LRT, CTRS Location: Courtyard  Group Description: [employer]. LRT and patients played games of basketball, drew with chalk, and played corn hole while outside in the courtyard while getting fresh air and sunlight. Music was being played in the background. LRT and peers conversed about different games they have played before, what they do in their free time and anything else that is on their minds. LRT encouraged pts to drink water after being outside, sweating and getting their heart rate up.  Goal Area(s) Addressed: Patient will build on frustration tolerance skills. Patients will partake in a competitive play game with peers. Patients will gain knowledge of new leisure interest/hobby.    Affect/Mood: N/A   Participation Level: Did not attend    Clinical Observations/Individualized Feedback: Patient did not attend group.   Plan: Continue to engage patient in RT group sessions 2-3x/week.   Jeoffrey BRAVO Celestia, LRT, CTRS 05/22/2024 11:45 AM

## 2024-05-22 NOTE — Group Note (Signed)
 [facility] LCSW Group Therapy Note   Group Date: 05/22/2024 Start Time: 1300 End Time: 1400   Type of Therapy/Topic:  Group Therapy:  Emotion Regulation  Participation Level:  Did Not Attend   Mood:  Description of Group:    The purpose of this group is to assist patients in learning to regulate negative emotions and experience positive emotions. Patients will be guided to discuss ways in which they have been vulnerable to their negative emotions. These vulnerabilities will be juxtaposed with experiences of positive emotions or situations, and patients challenged to use positive emotions to combat negative ones. Special emphasis will be placed on coping with negative emotions in conflict situations, and patients will process healthy conflict resolution skills.  Therapeutic Goals: Patient will identify two positive emotions or experiences to reflect on in order to balance out negative emotions:  Patient will label two or more emotions that they find the most difficult to experience:  Patient will be able to demonstrate positive conflict resolution skills through discussion or role plays:   Summary of Patient Progress:   x    Therapeutic Modalities:   Cognitive Behavioral Therapy Feelings Identification Dialectical Behavioral Therapy   Lauren Lincoln M Zyara Riling, LCSW

## 2024-05-22 NOTE — Progress Notes (Signed)
   05/22/24 0600  Psych Admission Type (Psych Patients Only)  Admission Status Voluntary  Psychosocial Assessment  Patient Complaints None  Eye Contact Fair  Facial Expression Flat  Affect Flat  Speech Soft  Interaction Minimal  Motor Activity Slow  Appearance/Hygiene Unremarkable  Behavior Characteristics Cooperative  Mood Pleasant  Thought Process  Coherency WDL  Content WDL  Delusions None reported or observed  Perception WDL  Hallucination None reported or observed  Judgment Poor  Confusion None  Danger to Self  Current suicidal ideation? Denies  Self-Injurious Behavior No self-injurious ideation or behavior indicators observed or expressed   Agreement Not to Harm Self Yes  Danger to Others  Danger to Others None reported or observed

## 2024-05-22 NOTE — Plan of Care (Signed)
  Problem: Education: Goal: Emotional status will improve Outcome: Progressing Goal: Mental status will improve Outcome: Progressing Goal: Verbalization of understanding the information provided will improve Outcome: Progressing   Problem: Coping: Goal: Ability to verbalize frustrations and anger appropriately will improve Outcome: Progressing Goal: Ability to demonstrate self-control will improve Outcome: Progressing   Problem: Health Behavior/Discharge Planning: Goal: Compliance with treatment plan for underlying cause of condition will improve Outcome: Progressing   Problem: Activity: Goal: Interest or engagement in activities will improve Outcome: Not Progressing Goal: Sleeping patterns will improve Outcome: Not Progressing

## 2024-05-22 NOTE — Progress Notes (Signed)
   05/22/24 1057  Psych Admission Type (Psych Patients Only)  Admission Status Voluntary  Psychosocial Assessment  Patient Complaints None  Eye Contact Fair  Facial Expression Flat  Affect Flat  Speech Soft  Interaction Minimal;Isolative  Motor Activity Slow  Appearance/Hygiene Unremarkable  Behavior Characteristics Cooperative;Guarded  Mood Pleasant  Aggressive Behavior  Effect No apparent injury  Thought Process  Coherency WDL  Content WDL  Delusions None reported or observed  Perception WDL  Hallucination None reported or observed  Judgment Poor  Confusion WDL  Danger to Self  Current suicidal ideation? Denies  Self-Injurious Behavior No self-injurious ideation or behavior indicators observed or expressed   Agreement Not to Harm Self Yes  Description of Agreement verbal  Danger to Others  Danger to Others None reported or observed

## 2024-05-22 NOTE — Plan of Care (Signed)
   Problem: Education: Goal: Emotional status will improve Outcome: Progressing Goal: Mental status will improve Outcome: Progressing   Problem: Activity: Goal: Sleeping patterns will improve Outcome: Progressing   Problem: Coping: Goal: Ability to verbalize frustrations and anger appropriately will improve Outcome: Progressing

## 2024-05-23 NOTE — Group Note (Signed)
 [facility] LCSW Group Therapy Note   Group Date: 05/23/2024 Start Time: 1310 End Time: 1400  Type of Therapy/Topic:  Group Therapy:  Feelings about Diagnosis  Participation Level:  Did Not Attend   Description of Group:    This group will allow patients to explore their thoughts and feelings about diagnoses they have received. Patients will be guided to explore their level of understanding and acceptance of these diagnoses. Facilitator will encourage patients to process their thoughts and feelings about the reactions of others to their diagnosis, and will guide patients in identifying ways to discuss their diagnosis with significant others in their lives. This group will be process-oriented, with patients participating in exploration of their own experiences as well as giving and receiving support and challenge from other group members.   Therapeutic Goals: 1. Patient will demonstrate understanding of diagnosis as evidence by identifying two or more symptoms of the disorder:  2. Patient will be able to express two feelings regarding the diagnosis 3. Patient will demonstrate ability to communicate their needs through discussion and/or role plays  Summary of Patient Progress: Patient declined to attend group.    Therapeutic Modalities:   Cognitive Behavioral Therapy Brief Therapy Feelings Identification    Sherryle JINNY Margo, LCSW

## 2024-05-23 NOTE — Progress Notes (Signed)
 [facility] MD Progress Note  05/23/2024 7:14 PM Lauren Poole  MRN:  [id number]  33 year old female who presented to the ED via EMS following a suicide attempt by overdose on 04/30/24. She reported ingesting approximately 30 Aleve pills. After ingestion, the patient did not experience vomiting or other acute physical symptoms. She reports that initially she wanted to not wake up, but later became fearful and contacted a friend, who encouraged her to seek care in the ED. The patient reports she has struggled with chronic suicidal ideation for about 1 year.  She states things have been hard but declines to elaborate on specific triggers.   Subjective:  Chart reviewed, case discussed in multidisciplinary meeting, patient seen during rounds.   10/7: On interview, patient is noted to be laying in bed looking out window.  She continues to minimize symptoms, rating depression 0 out of 10 and anxiety 0/10 today. She demonstrates poor insight into mental health condition.  She denies SI/HI/plan and denies hallucinations.  She has been compliant with Seroquel  extended release 300 mg nightly and denies adverse effects.  She does not voice any physical complaints at this time.  Provider encouraged patient to engage in treatment plan and activities on the unit.  Attempted to engage patient in conversation regarding treatment goals and future planning, but patient does not participate meaningfully in conversation.   10/6: On interview, patient is noted to be laying in bed.  She continues to be guarded and engages minimally with provider.  She continues to display poor insight and flat affect.  She does not actively engage in discussion of her treatment plan and is unable to discuss her treatment goals.  She has been compliant with Seroquel  XR 300 mg at bedtime and denies adverse effects.  She has not required any behavioral PRNs since 05/17/2024.  She does not voice any physical complaints at this time.  Provider  encouraged patient to participate in group therapies and milieu activities. Social work team to make [facility] referral.   10/5: On interview today patient is noted to be laying in bed.  She continues to engage minimally in interview and displays a flat affect.  She continues to minimize mental health condition and displays poor insight.  She denies SI/HI/plan and denies hallucinations.  She declines to participate further in interview.  10/4: On interview today, patient is noted to be laying in bed and is minimally engaged in interview.  She rates depression as 2 out of 10 and anxiety is 2 out of 10 today she denies SI/HI/plan and denies hallucinations.  She continues to minimize symptoms and shows poor insight into her mental health condition.  She denies physical complaints.  She reports tolerating current medication regimen well and denies adverse effects.  She declines Seroquel  dose increase at this time stating she will refuse it if dose is increased.  Medication trials during this admission include lithium , Abilify , and Invega .  10/3: On interview today, patient is noted to be laying in bed.  She continues to display flat affect and be minimally engaged in interview.  She reports tolerating increased dose of Seroquel  well without adverse effects.  She rates depression as a 1 out of 10 and anxiety is at a 0 out of 10 today.  She denies SI/HI/plan and denies hallucinations.  She continues to minimize symptoms and demonstrates lack of insight into her mental health condition.  Per social work team, APS report has been made and they have accepted the case.  Patient does not  voice any concerns or complaints today.    10/2: On interview today, patient noted to be laying in bed, she continues to be minimally engaging during interview and is noted to have a flat affect.  She continues to lack insight into her mental health.  She required behavioral PRN yesterday after she was observed to be making gestures in the  hallway and yelling at staff.  She rates depression as 3 out of 10 and anxiety at 0 out of 10 today.  She denies SI/HI/plan and denies hallucinations.  She is agreeable to Seroquel  dose increase.  10/1: On interview today, patient is noted to be laying in bed and engages minimally with the provider. She reports tolerating Seroquel  restart well without adverse effects.  Patient endorses passive SI without intent or plan.  She denies HI/plan and denies hallucinations.  Patient declines to participate in interview further.  9/30: On interview today, patient is noted to be laying in bed with a pillow covering her face.  She sits up to engage with provider.  She rates depression as a 2-3 out of 10 and anxiety as 4-5 out of 10 today.  She denies SI/HI/plan and denies hallucinations.  She is sleeping well and appetite is stable.  She states she does not like Invega  and refused dose today, stating it has made her feel more down than even.  She requests to discontinue Invega  for this reason.  She would like to restart Seroquel , though requests to start at a lower dose than what she was previously taking which was 200 mg, stating she felt tired with 200 mg dose.  Case discussed with attending physician and social work team.  Referral to be placed to APS.  9/29: On interview today, patient is found to be laying in bed and is minimally engaging with the provider.  She shows apathy, poor insight, and is unable to discuss her treatment goals.  Patient declined LAI.  She reports some emotional blunting with Invega  but otherwise is tolerating it well.  She states she initially found the medication beneficial in the first few days of taking it.  When asked about discharge planning, patient states she is not sure where she will go and states she does not care where she goes, [place]  When asked about support system, patient states that she does not have any friends or family.  She  denies SI/HI/plan and denies hallucinations.  Patient declines to participate further in the interview.  9/28: Patient is seen for follow-up they are alert and oriented on exam.  They continue to decline LAI and indicated they will continue with the oral medication.  They continue to have poor insight into the need for medication compliance and outpatient follow-up.  They deny current SI HI and AVH.  They do not require behavioral PRNs overnight.  They have been reclusive to room.  Engaged him in discharge planning discussing options such as shelters, mother's house, or friend's house and they are reaching out to be able today it appears they will likely need to go to the shelter though.  05/13/24: Per nursing patient declined LAI this morning.  On interview she indicates she does not take any longer.  She denies SI, HI, and AVH.  She reports she is agreeable to taking the oral 3 mg dose.  She demonstrates poor insight.  Discussed her past psych records of poor medication compliance and how long-acting injectable would remove the need  for her to take medication every day.  She continues to decline despite previously agreeing.  She did not require behavioral PRNs overnight they are performing ADLs.  They voiced no concerns or complaints.  Continue with some paranoia and bizarre behavior we will continue to monitor.  05/12/24: Per follow-up they are alert and oriented. They are feeling more like themself. Still some bizarre behavior discusses that they want to fast for 7 days and are unable to explain why. Then begins discussing their current hunger. They Deny SI/HI/AVH. She does laugh inappropriately at times. Notes no dizziness this morning. Risk, benefit, and adverse effects of invega  sustenna are reviewed and pt consents to LAI. They are performing ADLS. They voice no concerns or complaints. No behavioral PRNS overnight. Act referral is being placed.   05/11/24: Per follow-up this morning they are alert and  oriented.  They are pleasant and cooperative.  They continue to feel the Invega  is effective and they feel her thought processes been more linear and their mood has been more stable.  They continue to note some passive SI they deny plan or intent.  They had some dizziness this morning they are unclear on the timeframe with reference to getting the higher dose of Invega  today we will continue to monitor.  She does not appear to be internally preoccupied nor she observed to be responding to internal stimuli.  She denies HI.  Given multiple recent hospitalizations we will continue to monitor work towards setting patient up with ACT team and progress towards LAI in the coming days.  05/10/24: Patient seen for follow up. They are alert and oriented. They are pleasant and cooperative on exam. They feel that the invega  is working well and are agreeable to continued titration. They noted some passive si today without plan or intent. Disussed medication options for depression and patient indicates she is only willing to take the Invega  at this time but that she is open to an LAI. She denies HI and AVH. Discussed ACTT services given the multiple recent hospitalizations. Sleep and appetite are stable. Had an argument with another pt during group yesterday but feels that it was because the pt was judging them. No behavioral PRNS overnight.   05/09/24: Patient seen for follow up. They are alert and oriented. They report they are not agreeable to continuing abilify . They are not agreeable to ongoing lab test required for Lithium , will discontinue both. Discussed invega  and LAI, patient is agreeable to trial of invega  with openess to transitioning to LAI. She denies SI/HI/AVH. Notes improving sleep and stable appetite.   05/08/2024: On interview today patient is noted to be sitting up in bed.  She is more engaged in interview today.  She reports she has not started Abilify  yet but is planning to take first dose this evening.   She has started lithium  and is tolerating this well.  She endorses symptoms of depression but denies persistent anxiety.  She denies current SI/HI/plan and denies hallucinations.  She notes most recent episode of passive SI was yesterday.  She does not voice any concerns or complaints today.  She discusses desire to do something productive with her life.  05/07/2024: Per nursing report patient did not come out and remained mostly to her room.  This Clinical research associate talked with the patient and her room.  Patient reports that she got Seroquel  yesterday and still thinks sleepy because of that.  She reports that she got lithium  today in the morning for the first time  and would get Abilify  tonight.  Patient continues to report having suicidal ideations.  Reports that the last time she had suicidal ideations was yesterday in the evening time and she just woke up in the morning.  Still feeling depressed and rates her depression as 5 out of 10.  Patient reports that her normal self is when she rates her depression as 2 out of 10.  Continues to report that she has thoughts of shooting herself if she would have a gun.  But she does not have any access to guns.    05/06/2024:   per staff report patient has refused to take the Seroquel  at night yesterday.  Patient is still coming out and staying in her home.  Patient on interview reports that she came suicide attempt and reports that she had 1 previous suicide attempt this year.  Patient continues to report that she is depressed.  Patient reports that it is end of road for her.  Patient reports that she does not have access to gun but if she has done she would just get it over with.  Patient remained vague but reports that she has ongoing financial, family, life stressors.  Patient reports history consistent with bipolar 1   With manic episode lasting for 1 month but reports that these days she is not  having manic symptoms and mostly getting depressed.   9/19: On interview  today patient is noted to be seated up in bed looking towards the window.  She is more engaged in interview today than she has been in recent days.   She was compliant with Seroquel  dose last night, and states she feels that it is working.  She reports tolerating medication well without adverse effects.  She is sleeping well and reports appetite as normal.  She denies SI/HI/plan and denies hallucinations.  She rates depression as a 4 out of 10 and anxiety as a 0 out of 10 today.  She does not voice any concerns or complaints today.  9/18: On interview today patient is noted to be alert and laying in bed.  She declines to communicate with provider verbally.  Patient responds to questions during interview with a head nod or head shake.  Patient declined medication last night per nursing report.  She denies SI/HI/plan and denies hallucinations via shaking her head to indicate 'no' when asked.  She declined to answer questions regarding sleep and appetite.  When asked if tolerating medication patient nods head to indicate 'yes'.  Patient declined to participate further in interview.  9/17: On interview today patient is noted to be laying in bed with hand covering her face.  She continues to be withdrawn and minimizes symptoms.  She denies current SI/HI/plan and denies hallucinations.  She states she is tolerating initiation of Seroquel  well.  Patient declines to answer further questions.  9/16: On interview, patient is noted to be laying in bed.  Per nursing report, she has primarily been isolated to her room since her arrival on the unit.  She endorses passive SI without current intent or plan.  She denies HI/plan and denies hallucinations.  She reports she is sleeping and eating well.  Interviewer discussed treatment options with patient to include medication.  Patient states she prefers to be restarted on Seroquel , which she has taken in the past with good response.  Patient does not voice any other  complaints or concerns today.   Sleep: Fair  Appetite: Fair  Past Psychiatric History: see h&P Family  History:  Family History  Problem Relation Age of Onset   Schizophrenia Sister    Social History:  Social History   Substance and Sexual Activity  Alcohol Use Yes   Alcohol/week: 4.0 standard drinks of alcohol   Types: 4 Glasses of wine per week     Social History   Substance and Sexual Activity  Drug Use Not Currently   Types: Marijuana    Social History   Socioeconomic History   Marital status: Single    Spouse name: Not on file   Number of children: Not on file   Years of education: Not on file   Highest education level: Not on file  Occupational History   Not on file  Tobacco Use   Smoking status: Every Day    Current packs/day: 0.50    Average packs/day: 0.5 packs/day for 6.6 years (3.3 ttl pk-yrs)    Types: Cigarettes    Start date: 10/2017   Smokeless tobacco: Never  Vaping Use   Vaping status: Never Used  Substance and Sexual Activity   Alcohol use: Yes    Alcohol/week: 4.0 standard drinks of alcohol    Types: 4 Glasses of wine per week   Drug use: Not Currently    Types: Marijuana   Sexual activity: Yes    Birth control/protection: None  Other Topics Concern   Not on file  Social History Narrative   Not on file   Social Drivers of Health   Financial Resource Strain: Not on file  Food Insecurity: Food Insecurity Present (04/30/2024)   Hunger Vital Sign    Worried About Running Out of Food in the Last Year: Sometimes true    Ran Out of Food in the Last Year: Sometimes true  Transportation Needs: Unmet Transportation Needs (04/30/2024)   PRAPARE - Administrator, Civil Service (Medical): Yes    Lack of Transportation (Non-Medical): Yes  Physical Activity: Not on file  Stress: Not on file  Social Connections: Not on file   Past Medical History:  Past Medical History:  Diagnosis Date   Bipolar 1 disorder (HCC)    Chlamydia     Gonorrhea     Past Surgical History:  Procedure Laterality Date   NO PAST SURGERIES      Current Medications: Current Facility-Administered Medications  Medication Dose Route Frequency Provider Last Rate Last Admin   acetaminophen  (TYLENOL ) tablet 650 mg  650 mg Oral Q6H PRN Motley-Mangrum, Jadeka A, PMHNP       alum & mag hydroxide-simeth (MAALOX/MYLANTA) 200-200-20 MG/5ML suspension 30 mL  30 mL Oral Q4H PRN Motley-Mangrum, Jadeka A, PMHNP       haloperidol  (HALDOL ) tablet 5 mg  5 mg Oral TID PRN Motley-Mangrum, Jadeka A, PMHNP       And   diphenhydrAMINE  (BENADRYL ) capsule 50 mg  50 mg Oral TID PRN Motley-Mangrum, Jadeka A, PMHNP       haloperidol  lactate (HALDOL ) injection 5 mg  5 mg Intramuscular TID PRN Motley-Mangrum, Jadeka A, PMHNP       And   diphenhydrAMINE  (BENADRYL ) injection 50 mg  50 mg Intramuscular TID PRN Motley-Mangrum, Jadeka A, PMHNP       And   LORazepam  (ATIVAN ) injection 2 mg  2 mg Intramuscular TID PRN Motley-Mangrum, Jadeka A, PMHNP       haloperidol  lactate (HALDOL ) injection 10 mg  10 mg Intramuscular TID PRN Motley-Mangrum, Jadeka A, PMHNP   10 mg at 05/17/24 1233   And  diphenhydrAMINE  (BENADRYL ) injection 50 mg  50 mg Intramuscular TID PRN Motley-Mangrum, Jadeka A, PMHNP   50 mg at 05/17/24 1233   And   LORazepam  (ATIVAN ) injection 2 mg  2 mg Intramuscular TID PRN Motley-Mangrum, Jadeka A, PMHNP   2 mg at 05/17/24 1233   magnesium  hydroxide (MILK OF MAGNESIA) suspension 30 mL  30 mL Oral Daily PRN Motley-Mangrum, Jadeka A, PMHNP       QUEtiapine  (SEROQUEL  XR) 24 hr tablet 300 mg  300 mg Oral QHS Haeli Gerlich L, PA-C   300 mg at 05/22/24 2135   traZODone  (DESYREL ) tablet 50 mg  50 mg Oral QHS PRN Motley-Mangrum, Jadeka A, PMHNP   50 mg at 05/14/24 2105    Lab Results:  No results found for this or any previous visit (from the past 48 hours).   Blood Alcohol level:  Lab Results  Component Value Date   [facility] <15 04/30/2024   ETH <15 04/09/2024     Metabolic Disorder Labs: Lab Results  Component Value Date   HGBA1C 5.1 10/31/2023   MPG 99.67 10/31/2023   No results found for: PROLACTIN Lab Results  Component Value Date   CHOL 136 10/31/2023   TRIG 75 10/31/2023   HDL 37 (L) 10/31/2023   CHOLHDL 3.7 10/31/2023   VLDL 15 10/31/2023   LDLCALC 84 10/31/2023    Physical Findings: AIMS:  , ,  ,  ,    CIWA:    COWS:      Musculoskeletal: Strength & Muscle Tone: within normal limits Gait & Station: normal Assets  Assets: Communication Skills  Mental Status Exam:  General Appearance: Casual Eye contact: Minimal Speech: Normal rate Speech volume: Decreased Mood: Depressed Affect: Flat Thought process: Linear Descriptions of associations: Intact Orientation: Full (time, place and person) he Thought content: WDL Hallucinations: None Ideas of reference: None Suicidal thoughts: No Homicidal thoughts: No Memory: Immediate fair, recent fair, remote fair Judgment: Poor Insight: Poor Concentration: Fair Attention span: Fair Recall: [employer] of knowledge: Fair Language: Fair Psychomotor activity: Normal  Physical Exam: Physical Exam Vitals and nursing note reviewed.  HENT:     Head: Atraumatic.  Eyes:     Extraocular Movements: Extraocular movements intact.  Pulmonary:     Effort: Pulmonary effort is normal.  Neurological:     Mental Status: She is alert and oriented to person, place, and time.    ROS Blood pressure 121/85, pulse 89, temperature 97.7 F (36.5 C), resp. rate 18, height 5' 4 (1.626 m), weight 109.3 kg, SpO2 99%. Body mass index is 41.37 kg/m.  Diagnosis: Principal Problem:   Bipolar 1 disorder (HCC)   PLAN: Safety and Monitoring:  -- Voluntary admission to inpatient psychiatric unit for safety, stabilization and treatment  -- Daily contact with patient to assess and evaluate symptoms and progress in treatment  -- Patient's case to be discussed in multi-disciplinary team  meeting  -- Observation Level : q15 minute checks  -- Vital signs:  q12 hours  -- Precautions: suicide, elopement, and assault -- Encouraged patient to participate in unit milieu and in scheduled group therapies  2. Psychiatric Diagnoses and Treatment:  Bipolar disorder  Reviewed case with attending Dr. Donnelly who was agreeable with proceeding with LAI. Reviewed dosing with pharmacy who agreed.  Patient subsequently declined LAI. D/C lithium  300 mg b.I.d. and Abilify  5 mg at bedtime.  Labs ordered for thyroid  panel and vitamin D .  Discontinued Invega  per patient request Continue Seroquel  XR 300 mg nightly  The risks/benefits/side-effects/alternatives to this medication were discussed in detail with the patient and time was given for questions. The patient consents to medication trial. -- Metabolic profile and EKG monitoring obtained while on an atypical antipsychotic  -- Encouraged patient to participate in unit milieu and in scheduled group therapies                    3. Medical Issues Being Addressed:  No medical issues identified at this time.   4. Discharge Planning:  Patient to be referred for ACT services.   [facility] has accepted case  -- Social work and case management to assist with discharge planning and identification of hospital follow-up needs prior to discharge  -- Estimated LOS: 1-2 weeks  Camelia LITTIE Lukes, PA-C 05/23/2024, 7:14 PM

## 2024-05-23 NOTE — Group Note (Signed)
 Recreation Therapy Group Note   Group Topic:Emotion Expression  Group Date: 05/23/2024 Start Time: 1530 End Time: 1630 Facilitators: Celestia Jeoffrey FORBES ARTICE, CTRS Location: Craft Room  Group Description: Painting a [employer]omatic [employer] operational officer. Patients and LRT discuss what it means to be "at peace", what it feels like physically and mentally. Pts are given a canvas and watercolor paint to use and encouraged to draw their idea of a peaceful place. Pts and LRT discuss how they use this in their daily life post discharge. Pts are encouraged to take their canvas home with them as a reminder to find their peaceful place whenever they are feeling depressed, anxious, etc.    Goal Area(s) Addressed:  Patient will identify what it means to experience a "peaceful" emotion. Patient will identify a new coping skill.  Patient will express their emotions through art. Patients will increase communication by talking with LRT and peers while in group.   Affect/Mood: N/A   Participation Level: Did not attend    Clinical Observations/Individualized Feedback: Patient did not attend group.   Plan: Continue to engage patient in RT group sessions 2-3x/week.   Jeoffrey FORBES Celestia, LRT, CTRS 05/23/2024 5:14 PM

## 2024-05-23 NOTE — Progress Notes (Signed)
 Patient calm and pleasant during assessment denying SI/HI/AVH. Pt observed by this Clinical research associate interacting appropriately with staff and peers on the unit. Pt compliant with medication administration per MD orders. Pt given education, support, and encouragement to be active in her treatment plan. Pt being monitored Q 15 minutes for safety per unit protocol, remains safe on the unit

## 2024-05-23 NOTE — Progress Notes (Signed)
   05/23/24 1028  Psych Admission Type (Psych Patients Only)  Admission Status Voluntary  Psychosocial Assessment  Patient Complaints None  Eye Contact Fair  Facial Expression Flat  Affect Flat;Blunted  Speech Soft  Interaction Minimal;Isolative  Motor Activity Slow  Appearance/Hygiene Unremarkable  Behavior Characteristics Guarded;Cooperative  Mood Pleasant  Aggressive Behavior  Effect No apparent injury  Thought Process  Coherency WDL  Content WDL  Delusions None reported or observed  Perception WDL  Hallucination None reported or observed  Judgment Poor  Confusion WDL  Danger to Self  Current suicidal ideation? Denies  Self-Injurious Behavior No self-injurious ideation or behavior indicators observed or expressed   Agreement Not to Harm Self Yes  Description of Agreement verbal  Danger to Others  Danger to Others None reported or observed

## 2024-05-23 NOTE — Plan of Care (Signed)
  Problem: Education: Goal: Mental status will improve Outcome: Progressing Goal: Verbalization of understanding the information provided will improve Outcome: Progressing   Problem: Coping: Goal: Ability to verbalize frustrations and anger appropriately will improve Outcome: Progressing Goal: Ability to demonstrate self-control will improve Outcome: Progressing   Problem: Health Behavior/Discharge Planning: Goal: Compliance with treatment plan for underlying cause of condition will improve Outcome: Progressing   Problem: Education: Goal: Emotional status will improve Outcome: Not Progressing   Problem: Activity: Goal: Interest or engagement in activities will improve Outcome: Not Progressing Goal: Sleeping patterns will improve Outcome: Not Progressing

## 2024-05-23 NOTE — Group Note (Signed)
 Date:  05/23/2024 Time:  9:29 PM  Group Topic/Focus:  Orientation:   The focus of this group is to educate the patient on the purpose and policies of crisis stabilization and provide a format to answer questions about their admission.  The group details unit policies and expectations of patients while admitted. Wrap-Up Group:   The focus of this group is to help patients review their daily goal of treatment and discuss progress on daily workbooks.    Participation Level:  Active  Participation Quality:  Appropriate and Attentive  Affect:  Appropriate  Cognitive:  Appropriate  Insight: Appropriate and Good  Engagement in Group:  Engaged  Modes of Intervention:  Orientation  Additional Comments:     Lauren Poole 05/23/2024, 9:29 PM

## 2024-05-23 NOTE — Progress Notes (Signed)
   05/22/24 2000  Psych Admission Type (Psych Patients Only)  Admission Status Voluntary  Psychosocial Assessment  Patient Complaints None  Eye Contact Fair  Facial Expression Flat  Affect Flat  Speech Soft  Interaction Minimal;Isolative  Motor Activity Slow  Appearance/Hygiene Unremarkable  Behavior Characteristics Cooperative;Appropriate to situation  Mood Pleasant  Aggressive Behavior  Effect No apparent injury  Thought Process  Coherency WDL  Content WDL  Delusions None reported or observed  Perception WDL  Hallucination None reported or observed  Judgment Poor  Confusion WDL  Danger to Self  Current suicidal ideation? Denies  Self-Injurious Behavior No self-injurious ideation or behavior indicators observed or expressed   Agreement Not to Harm Self Yes  Description of Agreement verbal  Danger to Others  Danger to Others None reported or observed

## 2024-05-23 NOTE — Group Note (Signed)
 Date:  05/23/2024 Time:  4:20 PM  Group Topic/Focus:  Goals Group:   The focus of this group is to help patients establish daily goals to achieve during treatment and discuss how the patient can incorporate goal setting into their daily lives to aide in recovery.    Participation Level:  Did Not Attend   Deitra Clap [facility] 05/23/2024, 4:20 PM

## 2024-05-23 NOTE — Group Note (Signed)
 Recreation Therapy Group Note   Group Topic:Health and Wellness  Group Date: 05/23/2024 Start Time: 1000 End Time: 1100 Facilitators: Celestia Jeoffrey BRAVO, LRT, CTRS Location: Courtyard  Group Description: [employer]. LRT and patients played games of basketball, drew with chalk, and played corn hole while outside in the courtyard while getting fresh air and sunlight. Music was being played in the background. LRT and peers conversed about different games they have played before, what they do in their free time and anything else that is on their minds. LRT encouraged pts to drink water after being outside, sweating and getting their heart rate up.  Goal Area(s) Addressed: Patient will build on frustration tolerance skills. Patients will partake in a competitive play game with peers. Patients will gain knowledge of new leisure interest/hobby.    Affect/Mood: Appropriate   Participation Level: Non-verbal    Clinical Observations/Individualized Feedback: Lauren Poole came outside for the last 5 minutes.   Plan: Continue to engage patient in RT group sessions 2-3x/week.   Jeoffrey BRAVO Celestia, LRT, CTRS 05/23/2024 11:18 AM

## 2024-05-24 NOTE — Progress Notes (Signed)
   05/24/24 1000  Psych Admission Type (Psych Patients Only)  Admission Status Voluntary  Psychosocial Assessment  Patient Complaints None  Eye Contact Fair  Facial Expression Flat  Affect Flat;Appropriate to circumstance  Speech Soft  Interaction Minimal;Isolative  Motor Activity Slow  Appearance/Hygiene Unremarkable  Behavior Characteristics Guarded  Mood Pleasant  Aggressive Behavior  Effect No apparent injury  Thought Process  Coherency WDL  Content WDL  Delusions None reported or observed  Perception WDL  Hallucination None reported or observed  Judgment Impaired  Confusion None  Danger to Self  Current suicidal ideation? Denies  Agreement Not to Harm Self Yes  Description of Agreement verbal  Danger to Others  Danger to Others None reported or observed   Patient attended group. Patient talked about the group  we talked about emotional control. I did that otherwise I would have been in the jail now. I am not a people person anymore. I don't care anyone now because I don't have noting to loose. Patient just  watching outside but not in an irritable mood. Support and encouragement given.

## 2024-05-24 NOTE — Group Note (Signed)
 Date:  05/24/2024 Time:  9:11 PM  Group Topic/Focus:  Wellness Toolbox:   The focus of this group is to discuss various aspects of wellness, balancing those aspects and exploring ways to increase the ability to experience wellness.  Patients will create a wellness toolbox for use upon discharge.    Participation Level:  Active  Participation Quality:  Sharing  Affect:  Appropriate  Cognitive:  Appropriate  Insight: Appropriate  Engagement in Group:  Distracting  Modes of Intervention:  Discussion and Socialization  Additional Comments:  she had a moment of sidebar conversations but, was easily redirected.   Kerri Katz 05/24/2024, 9:11 PM

## 2024-05-24 NOTE — Group Note (Unsigned)
 Date:  05/24/2024 Time:  9:00 PM  Group Topic/Focus:  Wellness Toolbox:   The focus of this group is to discuss various aspects of wellness, balancing those aspects and exploring ways to increase the ability to experience wellness.  Patients will create a wellness toolbox for use upon discharge.     Participation Level:  {BHH PARTICIPATION OZCZO:77735}  Participation Quality:  {BHH PARTICIPATION QUALITY:22265}  Affect:  {BHH AFFECT:22266}  Cognitive:  {BHH COGNITIVE:22267}  Insight: {BHH Insight2:20797}  Engagement in Group:  {BHH ENGAGEMENT IN HMNLE:77731}  Modes of Intervention:  {BHH MODES OF INTERVENTION:22269}  Additional Comments:  ***  Kerri Katz 05/24/2024, 9:00 PM

## 2024-05-24 NOTE — Plan of Care (Signed)
   Problem: Education: Goal: Emotional status will improve Outcome: Progressing Goal: Mental status will improve Outcome: Progressing

## 2024-05-24 NOTE — Plan of Care (Signed)
  Problem: Education: Goal: Emotional status will improve Outcome: Progressing   Problem: Activity: Goal: Interest or engagement in activities will improve Outcome: Progressing   Problem: Coping: Goal: Ability to verbalize frustrations and anger appropriately will improve Outcome: Progressing   Problem: Physical Regulation: Goal: Ability to maintain clinical measurements within normal limits will improve Outcome: Progressing   Problem: Safety: Goal: Periods of time without injury will increase Outcome: Progressing

## 2024-05-24 NOTE — Progress Notes (Signed)
 [facility] MD Progress Note  05/24/2024 12:26 PM Lauren Poole  MRN:  [id number]  33 year old female who presented to the ED via EMS following a suicide attempt by overdose on 04/30/24. She reported ingesting approximately 30 Aleve pills. After ingestion, the patient did not experience vomiting or other acute physical symptoms. She reports that initially she wanted to not wake up, but later became fearful and contacted a friend, who encouraged her to seek care in the ED. The patient reports she has struggled with chronic suicidal ideation for about 1 year.  She states things have been hard but declines to elaborate on specific triggers.   Subjective:  Chart reviewed, case discussed in multidisciplinary meeting, patient seen during rounds.    10/8: On interview today, patient is noted to be resting in bed, she is calm and cooperative.  She denies SI/HI/plan and denies hallucinations.  She has been compliant with medication.  She reports tolerating current medication regimen well without significant adverse effects, though does report some tolerable drowsiness.  She states she does not like people but when encouraged by provider to engage in unit activities, patient states she will consider this.  When asked about treatment goals, patient states she would like to not be easily triggered by things.  She states she has spent time reflecting on her triggers and how she responds to them, and would like to work on having a healthier response.  Provider discussed coping mechanisms with patient, and encouraged participation in group therapies.  When asked about discharge planning, patient states she is not sure where she will go upon hospital discharge.  Social work team working on [facility] referral.  [facility] has accepted patient's case.  10/7: On interview, patient is noted to be laying in bed looking out window.  She continues to minimize symptoms, rating depression 0 out of 10 and anxiety 0/10 today. She  demonstrates poor insight into mental health condition.  She denies SI/HI/plan and denies hallucinations.  She has been compliant with Seroquel  extended release 300 mg nightly and denies adverse effects.  She does not voice any physical complaints at this time.  Provider encouraged patient to engage in treatment plan and activities on the unit.  Attempted to engage patient in conversation regarding treatment goals and future planning, but patient does not participate meaningfully in conversation.   10/6: On interview, patient is noted to be laying in bed.  She continues to be guarded and engages minimally with provider.  She continues to display poor insight and flat affect.  She does not actively engage in discussion of her treatment plan and is unable to discuss her treatment goals.  She has been compliant with Seroquel  XR 300 mg at bedtime and denies adverse effects.  She has not required any behavioral PRNs since 05/17/2024.  She does not voice any physical complaints at this time.  Provider encouraged patient to participate in group therapies and milieu activities. Social work team to make [facility] referral.   10/5: On interview today patient is noted to be laying in bed.  She continues to engage minimally in interview and displays a flat affect.  She continues to minimize mental health condition and displays poor insight.  She denies SI/HI/plan and denies hallucinations.  She declines to participate further in interview.  10/4: On interview today, patient is noted to be laying in bed and is minimally engaged in interview.  She rates depression as 2 out of 10 and anxiety is 2 out of 10 today she denies  SI/HI/plan and denies hallucinations.  She continues to minimize symptoms and shows poor insight into her mental health condition.  She denies physical complaints.  She reports tolerating current medication regimen well and denies adverse effects.  She declines Seroquel  dose increase at this time stating she will  refuse it if dose is increased.  Medication trials during this admission include lithium , Abilify , and Invega .  10/3: On interview today, patient is noted to be laying in bed.  She continues to display flat affect and be minimally engaged in interview.  She reports tolerating increased dose of Seroquel  well without adverse effects.  She rates depression as a 1 out of 10 and anxiety is at a 0 out of 10 today.  She denies SI/HI/plan and denies hallucinations.  She continues to minimize symptoms and demonstrates lack of insight into her mental health condition.  Per social work team, [facility] report has been made and they have accepted the case.  Patient does not voice any concerns or complaints today.    10/2: On interview today, patient noted to be laying in bed, she continues to be minimally engaging during interview and is noted to have a flat affect.  She continues to lack insight into her mental health.  She required behavioral PRN yesterday after she was observed to be making gestures in the hallway and yelling at staff.  She rates depression as 3 out of 10 and anxiety at 0 out of 10 today.  She denies SI/HI/plan and denies hallucinations.  She is agreeable to Seroquel  dose increase.  10/1: On interview today, patient is noted to be laying in bed and engages minimally with the provider. She reports tolerating Seroquel  restart well without adverse effects.  Patient endorses passive SI without intent or plan.  She denies HI/plan and denies hallucinations.  Patient declines to participate in interview further.  9/30: On interview today, patient is noted to be laying in bed with a pillow covering her face.  She sits up to engage with provider.  She rates depression as a 2-3 out of 10 and anxiety as 4-5 out of 10 today.  She denies SI/HI/plan and denies hallucinations.  She is sleeping well and appetite is stable.  She states she does not like Invega  and refused dose today, stating it has made her feel more down  than even.  She requests to discontinue Invega  for this reason.  She would like to restart Seroquel , though requests to start at a lower dose than what she was previously taking which was 200 mg, stating she felt tired with 200 mg dose.  Case discussed with attending physician and social work team.  Referral to be placed to [facility].  9/29: On interview today, patient is found to be laying in bed and is minimally engaging with the provider.  She shows apathy, poor insight, and is unable to discuss her treatment goals.  Patient declined LAI.  She reports some emotional blunting with Invega  but otherwise is tolerating it well.  She states she initially found the medication beneficial in the first few days of taking it.  When asked about discharge planning, patient states she is not sure where she will go and states she does not care where she goes, [place]  When asked about support system, patient states that she does not have any friends or family.  She denies SI/HI/plan and denies hallucinations.  Patient declines to participate further in the interview.  9/28:  Patient is seen for follow-up they are alert and oriented on exam.  They continue to decline LAI and indicated they will continue with the oral medication.  They continue to have poor insight into the need for medication compliance and outpatient follow-up.  They deny current SI HI and AVH.  They do not require behavioral PRNs overnight.  They have been reclusive to room.  Engaged him in discharge planning discussing options such as shelters, mother's house, or friend's house and they are reaching out to be able today it appears they will likely need to go to the shelter though.  05/13/24: Per nursing patient declined LAI this morning.  On interview she indicates she does not take any longer.  She denies SI, HI, and AVH.  She reports she is agreeable to taking the oral 3 mg dose.  She demonstrates poor insight.   Discussed her past psych records of poor medication compliance and how long-acting injectable would remove the need for her to take medication every day.  She continues to decline despite previously agreeing.  She did not require behavioral PRNs overnight they are performing ADLs.  They voiced no concerns or complaints.  Continue with some paranoia and bizarre behavior we will continue to monitor.  05/12/24: Per follow-up they are alert and oriented. They are feeling more like themself. Still some bizarre behavior discusses that they want to fast for 7 days and are unable to explain why. Then begins discussing their current hunger. They Deny SI/HI/AVH. She does laugh inappropriately at times. Notes no dizziness this morning. Risk, benefit, and adverse effects of invega  sustenna are reviewed and pt consents to LAI. They are performing ADLS. They voice no concerns or complaints. No behavioral PRNS overnight. Act referral is being placed.   05/11/24: Per follow-up this morning they are alert and oriented.  They are pleasant and cooperative.  They continue to feel the Invega  is effective and they feel her thought processes been more linear and their mood has been more stable.  They continue to note some passive SI they deny plan or intent.  They had some dizziness this morning they are unclear on the timeframe with reference to getting the higher dose of Invega  today we will continue to monitor.  She does not appear to be internally preoccupied nor she observed to be responding to internal stimuli.  She denies HI.  Given multiple recent hospitalizations we will continue to monitor work towards setting patient up with ACT team and progress towards LAI in the coming days.  05/10/24: Patient seen for follow up. They are alert and oriented. They are pleasant and cooperative on exam. They feel that the invega  is working well and are agreeable to continued titration. They noted some passive si today without plan or intent.  Disussed medication options for depression and patient indicates she is only willing to take the Invega  at this time but that she is open to an LAI. She denies HI and AVH. Discussed ACTT services given the multiple recent hospitalizations. Sleep and appetite are stable. Had an argument with another pt during group yesterday but feels that it was because the pt was judging them. No behavioral PRNS overnight.   05/09/24: Patient seen for follow up. They are alert and oriented. They report they are not agreeable to continuing abilify . They are not agreeable to ongoing lab test required for Lithium , will discontinue both. Discussed invega  and LAI, patient is agreeable to trial of invega  with openess to transitioning to LAI.  She denies SI/HI/AVH. Notes improving sleep and stable appetite.   05/08/2024: On interview today patient is noted to be sitting up in bed.  She is more engaged in interview today.  She reports she has not started Abilify  yet but is planning to take first dose this evening.  She has started lithium  and is tolerating this well.  She endorses symptoms of depression but denies persistent anxiety.  She denies current SI/HI/plan and denies hallucinations.  She notes most recent episode of passive SI was yesterday.  She does not voice any concerns or complaints today.  She discusses desire to do something productive with her life.  05/07/2024: Per nursing report patient did not come out and remained mostly to her room.  This Clinical research associate talked with the patient and her room.  Patient reports that she got Seroquel  yesterday and still thinks sleepy because of that.  She reports that she got lithium  today in the morning for the first time and would get Abilify  tonight.  Patient continues to report having suicidal ideations.  Reports that the last time she had suicidal ideations was yesterday in the evening time and she just woke up in the morning.  Still feeling depressed and rates her depression as 5 out of  10.  Patient reports that her normal self is when she rates her depression as 2 out of 10.  Continues to report that she has thoughts of shooting herself if she would have a gun.  But she does not have any access to guns.    05/06/2024:   per staff report patient has refused to take the Seroquel  at night yesterday.  Patient is still coming out and staying in her home.  Patient on interview reports that she came suicide attempt and reports that she had 1 previous suicide attempt this year.  Patient continues to report that she is depressed.  Patient reports that it is end of road for her.  Patient reports that she does not have access to gun but if she has done she would just get it over with.  Patient remained vague but reports that she has ongoing financial, family, life stressors.  Patient reports history consistent with bipolar 1   With manic episode lasting for 1 month but reports that these days she is not  having manic symptoms and mostly getting depressed.   9/19: On interview today patient is noted to be seated up in bed looking towards the window.  She is more engaged in interview today than she has been in recent days.   She was compliant with Seroquel  dose last night, and states she feels that it is working.  She reports tolerating medication well without adverse effects.  She is sleeping well and reports appetite as normal.  She denies SI/HI/plan and denies hallucinations.  She rates depression as a 4 out of 10 and anxiety as a 0 out of 10 today.  She does not voice any concerns or complaints today.  9/18: On interview today patient is noted to be alert and laying in bed.  She declines to communicate with provider verbally.  Patient responds to questions during interview with a head nod or head shake.  Patient declined medication last night per nursing report.  She denies SI/HI/plan and denies hallucinations via shaking her head to indicate 'no' when asked.  She declined to answer questions  regarding sleep and appetite.  When asked if tolerating medication patient nods head to indicate 'yes'.  Patient declined to participate  further in interview.  9/17: On interview today patient is noted to be laying in bed with hand covering her face.  She continues to be withdrawn and minimizes symptoms.  She denies current SI/HI/plan and denies hallucinations.  She states she is tolerating initiation of Seroquel  well.  Patient declines to answer further questions.  9/16: On interview, patient is noted to be laying in bed.  Per nursing report, she has primarily been isolated to her room since her arrival on the unit.  She endorses passive SI without current intent or plan.  She denies HI/plan and denies hallucinations.  She reports she is sleeping and eating well.  Interviewer discussed treatment options with patient to include medication.  Patient states she prefers to be restarted on Seroquel , which she has taken in the past with good response.  Patient does not voice any other complaints or concerns today.   Sleep: Good  Appetite: Fair  Past Psychiatric History: see h&P Family History:  Family History  Problem Relation Age of Onset   Schizophrenia Sister    Social History:  Social History   Substance and Sexual Activity  Alcohol Use Yes   Alcohol/week: 4.0 standard drinks of alcohol   Types: 4 Glasses of wine per week     Social History   Substance and Sexual Activity  Drug Use Not Currently   Types: Marijuana    Social History   Socioeconomic History   Marital status: Single    Spouse name: Not on file   Number of children: Not on file   Years of education: Not on file   Highest education level: Not on file  Occupational History   Not on file  Tobacco Use   Smoking status: Every Day    Current packs/day: 0.50    Average packs/day: 0.5 packs/day for 6.6 years (3.3 ttl pk-yrs)    Types: Cigarettes    Start date: 10/2017   Smokeless tobacco: Never  Vaping Use    Vaping status: Never Used  Substance and Sexual Activity   Alcohol use: Yes    Alcohol/week: 4.0 standard drinks of alcohol    Types: 4 Glasses of wine per week   Drug use: Not Currently    Types: Marijuana   Sexual activity: Yes    Birth control/protection: None  Other Topics Concern   Not on file  Social History Narrative   Not on file   Social Drivers of Health   Financial Resource Strain: Not on file  Food Insecurity: Food Insecurity Present (04/30/2024)   Hunger Vital Sign    Worried About Running Out of Food in the Last Year: Sometimes true    Ran Out of Food in the Last Year: Sometimes true  Transportation Needs: Unmet Transportation Needs (04/30/2024)   PRAPARE - Administrator, Civil Service (Medical): Yes    Lack of Transportation (Non-Medical): Yes  Physical Activity: Not on file  Stress: Not on file  Social Connections: Not on file   Past Medical History:  Past Medical History:  Diagnosis Date   Bipolar 1 disorder (HCC)    Chlamydia    Gonorrhea     Past Surgical History:  Procedure Laterality Date   NO PAST SURGERIES      Current Medications: Current Facility-Administered Medications  Medication Dose Route Frequency Provider Last Rate Last Admin   acetaminophen  (TYLENOL ) tablet 650 mg  650 mg Oral Q6H PRN Motley-Mangrum, Jadeka A, PMHNP       alum & mag  hydroxide-simeth (MAALOX/MYLANTA) 200-200-20 MG/5ML suspension 30 mL  30 mL Oral Q4H PRN Motley-Mangrum, Jadeka A, PMHNP       haloperidol  (HALDOL ) tablet 5 mg  5 mg Oral TID PRN Motley-Mangrum, Jadeka A, PMHNP       And   diphenhydrAMINE  (BENADRYL ) capsule 50 mg  50 mg Oral TID PRN Motley-Mangrum, Jadeka A, PMHNP       haloperidol  lactate (HALDOL ) injection 5 mg  5 mg Intramuscular TID PRN Motley-Mangrum, Jadeka A, PMHNP       And   diphenhydrAMINE  (BENADRYL ) injection 50 mg  50 mg Intramuscular TID PRN Motley-Mangrum, Jadeka A, PMHNP       And   LORazepam  (ATIVAN ) injection 2 mg  2 mg  Intramuscular TID PRN Motley-Mangrum, Jadeka A, PMHNP       haloperidol  lactate (HALDOL ) injection 10 mg  10 mg Intramuscular TID PRN Motley-Mangrum, Jadeka A, PMHNP   10 mg at 05/17/24 1233   And   diphenhydrAMINE  (BENADRYL ) injection 50 mg  50 mg Intramuscular TID PRN Motley-Mangrum, Jadeka A, PMHNP   50 mg at 05/17/24 1233   And   LORazepam  (ATIVAN ) injection 2 mg  2 mg Intramuscular TID PRN Motley-Mangrum, Jadeka A, PMHNP   2 mg at 05/17/24 1233   magnesium  hydroxide (MILK OF MAGNESIA) suspension 30 mL  30 mL Oral Daily PRN Motley-Mangrum, Jadeka A, PMHNP       QUEtiapine  (SEROQUEL  XR) 24 hr tablet 300 mg  300 mg Oral QHS Delene Morais L, PA-C   300 mg at 05/23/24 2105   traZODone  (DESYREL ) tablet 50 mg  50 mg Oral QHS PRN Motley-Mangrum, Jadeka A, PMHNP   50 mg at 05/14/24 2105    Lab Results:  No results found for this or any previous visit (from the past 48 hours).   Blood Alcohol level:  Lab Results  Component Value Date   [facility] <15 04/30/2024   ETH <15 04/09/2024    Metabolic Disorder Labs: Lab Results  Component Value Date   HGBA1C 5.1 10/31/2023   MPG 99.67 10/31/2023   No results found for: PROLACTIN Lab Results  Component Value Date   CHOL 136 10/31/2023   TRIG 75 10/31/2023   HDL 37 (L) 10/31/2023   CHOLHDL 3.7 10/31/2023   VLDL 15 10/31/2023   LDLCALC 84 10/31/2023    Physical Findings: AIMS:  , ,  ,  ,    CIWA:    COWS:      Musculoskeletal: Strength & Muscle Tone: within normal limits Gait & Station: normal Assets  Assets: Communication Skills  Mental Status Exam:  General Appearance: Casual Eye contact: Minimal Speech: Normal rate Speech volume: Decreased Mood: Depressed Affect: Flat Thought process: Linear Descriptions of associations: Intact Orientation: Full (time, place and person) he Thought content: WDL Hallucinations: None Ideas of reference: None Suicidal thoughts: No Homicidal thoughts: No Memory: Immediate fair, recent  fair, remote fair Judgment: Poor Insight: Poor Concentration: Fair Attention span: Fair Recall: [employer] of knowledge: Fair Language: Fair Psychomotor activity: Normal  Physical Exam: Physical Exam Vitals and nursing note reviewed.  HENT:     Head: Atraumatic.  Eyes:     Extraocular Movements: Extraocular movements intact.  Pulmonary:     Effort: Pulmonary effort is normal.  Neurological:     Mental Status: She is alert and oriented to person, place, and time.    ROS Blood pressure 121/85, pulse 89, temperature 97.7 F (36.5 C), resp. rate 18, height 5' 4 (1.626 m), weight  109.3 kg, SpO2 99%. Body mass index is 41.37 kg/m.  Diagnosis: Principal Problem:   Bipolar 1 disorder (HCC)   PLAN: Safety and Monitoring:  -- Voluntary admission to inpatient psychiatric unit for safety, stabilization and treatment  -- Daily contact with patient to assess and evaluate symptoms and progress in treatment  -- Patient's case to be discussed in multi-disciplinary team meeting  -- Observation Level : q15 minute checks  -- Vital signs:  q12 hours  -- Precautions: suicide, elopement, and assault -- Encouraged patient to participate in unit milieu and in scheduled group therapies   2. Psychiatric Diagnoses and Treatment:  Bipolar disorder  Reviewed case with attending Dr. Donnelly who was agreeable with proceeding with LAI. Reviewed dosing with pharmacy who agreed.  Patient subsequently declined LAI. D/C lithium  300 mg b.I.d. and Abilify  5 mg at bedtime.  Labs ordered for thyroid  panel and vitamin D .  Discontinued Invega  per patient request Continue Seroquel  XR 300 mg nightly               The risks/benefits/side-effects/alternatives to this medication were discussed in detail with the patient and time was given for questions. The patient consents to medication trial. -- Metabolic profile and EKG monitoring obtained while on an atypical antipsychotic  -- Encouraged patient to  participate in unit milieu and in scheduled group therapies                    3. Medical Issues Being Addressed:  No medical issues identified at this time.   4. Discharge Planning:  Patient to be referred for ACT services.   [facility] has accepted case  -- Social work and case management to assist with discharge planning and identification of hospital follow-up needs prior to discharge  -- Estimated LOS: 1-2 weeks  Camelia LITTIE Lukes, PA-C 05/24/2024, 12:26 PM

## 2024-05-24 NOTE — Group Note (Signed)
 [facility] LCSW Group Therapy Note   Group Date: 05/24/2024 Start Time: 1300 End Time: 1350   Type of Therapy/Topic:  Group Therapy:  Emotion Regulation  Participation Level:  Active    Description of Group:    The purpose of this group is to assist patients in learning to regulate negative emotions and experience positive emotions. Patients will be guided to discuss ways in which they have been vulnerable to their negative emotions. These vulnerabilities will be juxtaposed with experiences of positive emotions or situations, and patients challenged to use positive emotions to combat negative ones. Special emphasis will be placed on coping with negative emotions in conflict situations, and patients will process healthy conflict resolution skills.  Therapeutic Goals: Patient will identify two positive emotions or experiences to reflect on in order to balance out negative emotions:  Patient will label two or more emotions that they find the most difficult to experience:  Patient will be able to demonstrate positive conflict resolution skills through discussion or role plays:   Summary of Patient Progress: Patient was present for the entirety of the group process. During her time in the room, she contributed to the discussion and was appropriate. She appeared to have some insight into the topic and herself. Pt appeared open and receptive to feedback/comments from both her peers and the facilitator.   Therapeutic Modalities:   Cognitive Behavioral Therapy Feelings Identification Dialectical Behavioral Therapy   Nadara JONELLE Fam, LCSW

## 2024-05-25 NOTE — Group Note (Signed)
 Recreation Therapy Group Note   Group Topic:Animal Assisted Therapy   Group Date: 05/25/2024 Start Time: 1400 End Time: 1430 Facilitators: Celestia Jeoffrey BRAVO, LRT, CTRS Location: [facility]  Group Description: AAA. Animal-Assisted Activity provides opportunities for motivational, educational, therapeutic and/or recreational benefits to enhance quality of life. Selinda and Rollo visited the unit to interact with patients.    Goal Areas Addressed:  Reduced anxiety and stress Improved mood Increased social interaction Enhanced communication skills Reduced loneliness and isolation Improved emotional regulation   Affect/Mood: N/A   Participation Level: Did not attend    Clinical Observations/Individualized Feedback: Patient did not attend group.   Plan: Continue to engage patient in RT group sessions 2-3x/week.   Jeoffrey BRAVO Celestia, LRT, CTRS 05/25/2024 5:02 PM

## 2024-05-25 NOTE — Progress Notes (Signed)
 [facility] MD Progress Note  05/25/2024 4:59 PM Mirely Pangle  MRN:  [id number]  33 year old female who presented to the ED via EMS following a suicide attempt by overdose on 04/30/24. She reported ingesting approximately 30 Aleve pills. After ingestion, the patient did not experience vomiting or other acute physical symptoms. She reports that initially she wanted to not wake up, but later became fearful and contacted a friend, who encouraged her to seek care in the ED. The patient reports she has struggled with chronic suicidal ideation for about 1 year.  She states things have been hard but declines to elaborate on specific triggers.   Subjective:  Chart reviewed, case discussed in multidisciplinary meeting, patient seen during rounds.    10/9: On interview today, patient is noted to be resting in bed.  She is more engaging with provider today.  She rates depression 0 out of 10 and anxiety as 2 out of 10.  She denies current SI/HI/plan and denies hallucinations.  Per social work report, patient did well in group therapy today and was actively participating.  Patient reports she is feeling good for the first time in a while, was seen interacting on the unit yesterday, patient states yesterday was a good day.  She is future oriented, thinking about where she will go upon discharge, stating that staying with her ex-boyfriend is an option but would not be her first choice.  [facility] has accepted her case.  She reports tolerating current medication regimen well without adverse effects.  10/8: On interview today, patient is noted to be resting in bed, she is calm and cooperative.  She denies SI/HI/plan and denies hallucinations.  She has been compliant with medication.  She reports tolerating current medication regimen well without significant adverse effects, though does report some tolerable drowsiness.  She states she does not like people but when encouraged by provider to engage in unit activities,  patient states she will consider this.  When asked about treatment goals, patient states she would like to not be easily triggered by things.  She states she has spent time reflecting on her triggers and how she responds to them, and would like to work on having a healthier response.  Provider discussed coping mechanisms with patient, and encouraged participation in group therapies.  When asked about discharge planning, patient states she is not sure where she will go upon hospital discharge.  Social work team working on [facility] referral.  [facility] has accepted patient's case.  10/7: On interview, patient is noted to be laying in bed looking out window.  She continues to minimize symptoms, rating depression 0 out of 10 and anxiety 0/10 today. She demonstrates poor insight into mental health condition.  She denies SI/HI/plan and denies hallucinations.  She has been compliant with Seroquel  extended release 300 mg nightly and denies adverse effects.  She does not voice any physical complaints at this time.  Provider encouraged patient to engage in treatment plan and activities on the unit.  Attempted to engage patient in conversation regarding treatment goals and future planning, but patient does not participate meaningfully in conversation.   10/6: On interview, patient is noted to be laying in bed.  She continues to be guarded and engages minimally with provider.  She continues to display poor insight and flat affect.  She does not actively engage in discussion of her treatment plan and is unable to discuss her treatment goals.  She has been compliant with Seroquel  XR 300 mg at bedtime and denies  adverse effects.  She has not required any behavioral PRNs since 05/17/2024.  She does not voice any physical complaints at this time.  Provider encouraged patient to participate in group therapies and milieu activities. Social work team to make [facility] referral.   10/5: On interview today patient is noted to be laying in bed.   She continues to engage minimally in interview and displays a flat affect.  She continues to minimize mental health condition and displays poor insight.  She denies SI/HI/plan and denies hallucinations.  She declines to participate further in interview.  10/4: On interview today, patient is noted to be laying in bed and is minimally engaged in interview.  She rates depression as 2 out of 10 and anxiety is 2 out of 10 today she denies SI/HI/plan and denies hallucinations.  She continues to minimize symptoms and shows poor insight into her mental health condition.  She denies physical complaints.  She reports tolerating current medication regimen well and denies adverse effects.  She declines Seroquel  dose increase at this time stating she will refuse it if dose is increased.  Medication trials during this admission include lithium , Abilify , and Invega .  10/3: On interview today, patient is noted to be laying in bed.  She continues to display flat affect and be minimally engaged in interview.  She reports tolerating increased dose of Seroquel  well without adverse effects.  She rates depression as a 1 out of 10 and anxiety is at a 0 out of 10 today.  She denies SI/HI/plan and denies hallucinations.  She continues to minimize symptoms and demonstrates lack of insight into her mental health condition.  Per social work team, [facility] report has been made and they have accepted the case.  Patient does not voice any concerns or complaints today.    10/2: On interview today, patient noted to be laying in bed, she continues to be minimally engaging during interview and is noted to have a flat affect.  She continues to lack insight into her mental health.  She required behavioral PRN yesterday after she was observed to be making gestures in the hallway and yelling at staff.  She rates depression as 3 out of 10 and anxiety at 0 out of 10 today.  She denies SI/HI/plan and denies hallucinations.  She is agreeable to Seroquel   dose increase.  10/1: On interview today, patient is noted to be laying in bed and engages minimally with the provider. She reports tolerating Seroquel  restart well without adverse effects.  Patient endorses passive SI without intent or plan.  She denies HI/plan and denies hallucinations.  Patient declines to participate in interview further.  9/30: On interview today, patient is noted to be laying in bed with a pillow covering her face.  She sits up to engage with provider.  She rates depression as a 2-3 out of 10 and anxiety as 4-5 out of 10 today.  She denies SI/HI/plan and denies hallucinations.  She is sleeping well and appetite is stable.  She states she does not like Invega  and refused dose today, stating it has made her feel more down than even.  She requests to discontinue Invega  for this reason.  She would like to restart Seroquel , though requests to start at a lower dose than what she was previously taking which was 200 mg, stating she felt tired with 200 mg dose.  Case discussed with attending physician and social work team.  Referral to be placed to [facility].  9/29: On interview today,  patient is found to be laying in bed and is minimally engaging with the provider.  She shows apathy, poor insight, and is unable to discuss her treatment goals.  Patient declined LAI.  She reports some emotional blunting with Invega  but otherwise is tolerating it well.  She states she initially found the medication beneficial in the first few days of taking it.  When asked about discharge planning, patient states she is not sure where she will go and states she does not care where she goes, commenting [place]  When asked about support system, patient states that she does not have any friends or family.  She denies SI/HI/plan and denies hallucinations.  Patient declines to participate further in the interview.  9/28: Patient is seen for follow-up they are alert and oriented on exam.   They continue to decline LAI and indicated they will continue with the oral medication.  They continue to have poor insight into the need for medication compliance and outpatient follow-up.  They deny current SI HI and AVH.  They do not require behavioral PRNs overnight.  They have been reclusive to room.  Engaged him in discharge planning discussing options such as shelters, mother's house, or friend's house and they are reaching out to be able today it appears they will likely need to go to the shelter though.  05/13/24: Per nursing patient declined LAI this morning.  On interview she indicates she does not take any longer.  She denies SI, HI, and AVH.  She reports she is agreeable to taking the oral 3 mg dose.  She demonstrates poor insight.  Discussed her past psych records of poor medication compliance and how long-acting injectable would remove the need for her to take medication every day.  She continues to decline despite previously agreeing.  She did not require behavioral PRNs overnight they are performing ADLs.  They voiced no concerns or complaints.  Continue with some paranoia and bizarre behavior we will continue to monitor.  05/12/24: Per follow-up they are alert and oriented. They are feeling more like themself. Still some bizarre behavior discusses that they want to fast for 7 days and are unable to explain why. Then begins discussing their current hunger. They Deny SI/HI/AVH. She does laugh inappropriately at times. Notes no dizziness this morning. Risk, benefit, and adverse effects of invega  sustenna are reviewed and pt consents to LAI. They are performing ADLS. They voice no concerns or complaints. No behavioral PRNS overnight. Act referral is being placed.   05/11/24: Per follow-up this morning they are alert and oriented.  They are pleasant and cooperative.  They continue to feel the Invega  is effective and they feel her thought processes been more linear and their mood has been more stable.   They continue to note some passive SI they deny plan or intent.  They had some dizziness this morning they are unclear on the timeframe with reference to getting the higher dose of Invega  today we will continue to monitor.  She does not appear to be internally preoccupied nor she observed to be responding to internal stimuli.  She denies HI.  Given multiple recent hospitalizations we will continue to monitor work towards setting patient up with ACT team and progress towards LAI in the coming days.  05/10/24: Patient seen for follow up. They are alert and oriented. They are pleasant and cooperative on exam. They feel that the invega  is working well and are agreeable to  continued titration. They noted some passive si today without plan or intent. Disussed medication options for depression and patient indicates she is only willing to take the Invega  at this time but that she is open to an LAI. She denies HI and AVH. Discussed ACTT services given the multiple recent hospitalizations. Sleep and appetite are stable. Had an argument with another pt during group yesterday but feels that it was because the pt was judging them. No behavioral PRNS overnight.   05/09/24: Patient seen for follow up. They are alert and oriented. They report they are not agreeable to continuing abilify . They are not agreeable to ongoing lab test required for Lithium , will discontinue both. Discussed invega  and LAI, patient is agreeable to trial of invega  with openess to transitioning to LAI. She denies SI/HI/AVH. Notes improving sleep and stable appetite.   05/08/2024: On interview today patient is noted to be sitting up in bed.  She is more engaged in interview today.  She reports she has not started Abilify  yet but is planning to take first dose this evening.  She has started lithium  and is tolerating this well.  She endorses symptoms of depression but denies persistent anxiety.  She denies current SI/HI/plan and denies hallucinations.  She  notes most recent episode of passive SI was yesterday.  She does not voice any concerns or complaints today.  She discusses desire to do something productive with her life.  05/07/2024: Per nursing report patient did not come out and remained mostly to her room.  This Clinical research associate talked with the patient and her room.  Patient reports that she got Seroquel  yesterday and still thinks sleepy because of that.  She reports that she got lithium  today in the morning for the first time and would get Abilify  tonight.  Patient continues to report having suicidal ideations.  Reports that the last time she had suicidal ideations was yesterday in the evening time and she just woke up in the morning.  Still feeling depressed and rates her depression as 5 out of 10.  Patient reports that her normal self is when she rates her depression as 2 out of 10.  Continues to report that she has thoughts of shooting herself if she would have a gun.  But she does not have any access to guns.    05/06/2024:   per staff report patient has refused to take the Seroquel  at night yesterday.  Patient is still coming out and staying in her home.  Patient on interview reports that she came suicide attempt and reports that she had 1 previous suicide attempt this year.  Patient continues to report that she is depressed.  Patient reports that it is end of road for her.  Patient reports that she does not have access to gun but if she has done she would just get it over with.  Patient remained vague but reports that she has ongoing financial, family, life stressors.  Patient reports history consistent with bipolar 1   With manic episode lasting for 1 month but reports that these days she is not  having manic symptoms and mostly getting depressed.   9/19: On interview today patient is noted to be seated up in bed looking towards the window.  She is more engaged in interview today than she has been in recent days.   She was compliant with Seroquel  dose  last night, and states she feels that it is working.  She reports tolerating medication well without adverse effects.  She is sleeping well and reports appetite as normal.  She denies SI/HI/plan and denies hallucinations.  She rates depression as a 4 out of 10 and anxiety as a 0 out of 10 today.  She does not voice any concerns or complaints today.  9/18: On interview today patient is noted to be alert and laying in bed.  She declines to communicate with provider verbally.  Patient responds to questions during interview with a head nod or head shake.  Patient declined medication last night per nursing report.  She denies SI/HI/plan and denies hallucinations via shaking her head to indicate 'no' when asked.  She declined to answer questions regarding sleep and appetite.  When asked if tolerating medication patient nods head to indicate 'yes'.  Patient declined to participate further in interview.  9/17: On interview today patient is noted to be laying in bed with hand covering her face.  She continues to be withdrawn and minimizes symptoms.  She denies current SI/HI/plan and denies hallucinations.  She states she is tolerating initiation of Seroquel  well.  Patient declines to answer further questions.  9/16: On interview, patient is noted to be laying in bed.  Per nursing report, she has primarily been isolated to her room since her arrival on the unit.  She endorses passive SI without current intent or plan.  She denies HI/plan and denies hallucinations.  She reports she is sleeping and eating well.  Interviewer discussed treatment options with patient to include medication.  Patient states she prefers to be restarted on Seroquel , which she has taken in the past with good response.  Patient does not voice any other complaints or concerns today.   Sleep: Good  Appetite: Fair  Past Psychiatric History: see h&P Family History:  Family History  Problem Relation Age of Onset   Schizophrenia Sister     Social History:  Social History   Substance and Sexual Activity  Alcohol Use Yes   Alcohol/week: 4.0 standard drinks of alcohol   Types: 4 Glasses of wine per week     Social History   Substance and Sexual Activity  Drug Use Not Currently   Types: Marijuana    Social History   Socioeconomic History   Marital status: Single    Spouse name: Not on file   Number of children: Not on file   Years of education: Not on file   Highest education level: Not on file  Occupational History   Not on file  Tobacco Use   Smoking status: Every Day    Current packs/day: 0.50    Average packs/day: 0.5 packs/day for 6.6 years (3.3 ttl pk-yrs)    Types: Cigarettes    Start date: 10/2017   Smokeless tobacco: Never  Vaping Use   Vaping status: Never Used  Substance and Sexual Activity   Alcohol use: Yes    Alcohol/week: 4.0 standard drinks of alcohol    Types: 4 Glasses of wine per week   Drug use: Not Currently    Types: Marijuana   Sexual activity: Yes    Birth control/protection: None  Other Topics Concern   Not on file  Social History Narrative   Not on file   Social Drivers of Health   Financial Resource Strain: Not on file  Food Insecurity: Food Insecurity Present (04/30/2024)   Hunger Vital Sign    Worried About Running Out of Food in the Last Year: Sometimes true    Ran Out of Food in the Last Year: Sometimes  true  Transportation Needs: Unmet Transportation Needs (04/30/2024)   PRAPARE - Administrator, Civil Service (Medical): Yes    Lack of Transportation (Non-Medical): Yes  Physical Activity: Not on file  Stress: Not on file  Social Connections: Not on file   Past Medical History:  Past Medical History:  Diagnosis Date   Bipolar 1 disorder (HCC)    Chlamydia    Gonorrhea     Past Surgical History:  Procedure Laterality Date   NO PAST SURGERIES      Current Medications: Current Facility-Administered Medications  Medication Dose Route  Frequency Provider Last Rate Last Admin   acetaminophen  (TYLENOL ) tablet 650 mg  650 mg Oral Q6H PRN Motley-Mangrum, Jadeka A, PMHNP       alum & mag hydroxide-simeth (MAALOX/MYLANTA) 200-200-20 MG/5ML suspension 30 mL  30 mL Oral Q4H PRN Motley-Mangrum, Jadeka A, PMHNP       haloperidol  (HALDOL ) tablet 5 mg  5 mg Oral TID PRN Motley-Mangrum, Jadeka A, PMHNP       And   diphenhydrAMINE  (BENADRYL ) capsule 50 mg  50 mg Oral TID PRN Motley-Mangrum, Jadeka A, PMHNP       haloperidol  lactate (HALDOL ) injection 5 mg  5 mg Intramuscular TID PRN Motley-Mangrum, Jadeka A, PMHNP       And   diphenhydrAMINE  (BENADRYL ) injection 50 mg  50 mg Intramuscular TID PRN Motley-Mangrum, Jadeka A, PMHNP       And   LORazepam  (ATIVAN ) injection 2 mg  2 mg Intramuscular TID PRN Motley-Mangrum, Jadeka A, PMHNP       haloperidol  lactate (HALDOL ) injection 10 mg  10 mg Intramuscular TID PRN Motley-Mangrum, Jadeka A, PMHNP   10 mg at 05/17/24 1233   And   diphenhydrAMINE  (BENADRYL ) injection 50 mg  50 mg Intramuscular TID PRN Motley-Mangrum, Jadeka A, PMHNP   50 mg at 05/17/24 1233   And   LORazepam  (ATIVAN ) injection 2 mg  2 mg Intramuscular TID PRN Motley-Mangrum, Jadeka A, PMHNP   2 mg at 05/17/24 1233   magnesium  hydroxide (MILK OF MAGNESIA) suspension 30 mL  30 mL Oral Daily PRN Motley-Mangrum, Jadeka A, PMHNP       QUEtiapine  (SEROQUEL  XR) 24 hr tablet 300 mg  300 mg Oral QHS Stuart Mirabile L, PA-C   300 mg at 05/24/24 2103   traZODone  (DESYREL ) tablet 50 mg  50 mg Oral QHS PRN Motley-Mangrum, Jadeka A, PMHNP   50 mg at 05/14/24 2105    Lab Results:  No results found for this or any previous visit (from the past 48 hours).   Blood Alcohol level:  Lab Results  Component Value Date   [facility] <15 04/30/2024   ETH <15 04/09/2024    Metabolic Disorder Labs: Lab Results  Component Value Date   HGBA1C 5.1 10/31/2023   MPG 99.67 10/31/2023   No results found for: PROLACTIN Lab Results  Component Value Date    CHOL 136 10/31/2023   TRIG 75 10/31/2023   HDL 37 (L) 10/31/2023   CHOLHDL 3.7 10/31/2023   VLDL 15 10/31/2023   LDLCALC 84 10/31/2023    Physical Findings: AIMS:  , ,  ,  ,    CIWA:    COWS:      Musculoskeletal: Strength & Muscle Tone: within normal limits Gait & Station: normal Assets  Assets: Communication Skills  Mental Status Exam:  General Appearance: Casual Eye contact: Minimal Speech: Normal rate Speech volume: Decreased Mood: Depressed Affect: Congruent Thought process: Linear  Descriptions of associations: Intact Orientation: Full (time, place and person) he Thought content: WDL Hallucinations: None Ideas of reference: None Suicidal thoughts: No Homicidal thoughts: No Memory: Immediate fair, recent fair, remote fair Judgment: Poor Insight: Poor Concentration: Fair Attention span: Fair Recall: [employer] of knowledge: Fair Language: Fair Psychomotor activity: Normal  Physical Exam: Physical Exam Vitals and nursing note reviewed.  HENT:     Head: Atraumatic.  Eyes:     Extraocular Movements: Extraocular movements intact.  Pulmonary:     Effort: Pulmonary effort is normal.  Neurological:     Mental Status: She is alert and oriented to person, place, and time.    ROS Blood pressure 113/82, pulse 73, temperature (!) 97.5 F (36.4 C), resp. rate 18, height 5' 4 (1.626 m), weight 109.3 kg, SpO2 100%. Body mass index is 41.37 kg/m.  Diagnosis: Principal Problem:   Bipolar 1 disorder (HCC)   PLAN: Safety and Monitoring:  -- Voluntary admission to inpatient psychiatric unit for safety, stabilization and treatment  -- Daily contact with patient to assess and evaluate symptoms and progress in treatment  -- Patient's case to be discussed in multi-disciplinary team meeting  -- Observation Level : q15 minute checks  -- Vital signs:  q12 hours  -- Precautions: suicide, elopement, and assault -- Encouraged patient to participate in unit milieu  and in scheduled group therapies   2. Psychiatric Diagnoses and Treatment:  Bipolar disorder  Reviewed case with attending Dr. Donnelly who was agreeable with proceeding with LAI. Reviewed dosing with pharmacy who agreed.  Patient subsequently declined LAI. D/C lithium  300 mg b.I.d. and Abilify  5 mg at bedtime.  Labs ordered for thyroid  panel and vitamin D .  Discontinued Invega  per patient request Continue Seroquel  XR 300 mg nightly               The risks/benefits/side-effects/alternatives to this medication were discussed in detail with the patient and time was given for questions. The patient consents to medication trial. -- Metabolic profile and EKG monitoring obtained while on an atypical antipsychotic  -- Encouraged patient to participate in unit milieu and in scheduled group therapies                    3. Medical Issues Being Addressed:  No medical issues identified at this time.   4. Discharge Planning:  Patient to be referred for ACT services.   [facility] has accepted case  -- Social work and case management to assist with discharge planning and identification of hospital follow-up needs prior to discharge  -- Estimated LOS: 1-2 weeks  Camelia LITTIE Lukes, PA-C 05/25/2024, 4:59 PM

## 2024-05-25 NOTE — Group Note (Signed)
 [facility] LCSW Group Therapy Note   Group Date: 05/25/2024 Start Time: 1300 End Time: 1400   Type of Therapy/Topic:  Group Therapy:  Emotion Regulation  Participation Level:  Active   Mood: Appropriate   Description of Group:    The purpose of this group is to assist patients in learning to regulate negative emotions and experience positive emotions. Patients will be guided to discuss ways in which they have been vulnerable to their negative emotions. These vulnerabilities will be juxtaposed with experiences of positive emotions or situations, and patients challenged to use positive emotions to combat negative ones. Special emphasis will be placed on coping with negative emotions in conflict situations, and patients will process healthy conflict resolution skills.  Therapeutic Goals: Patient will identify two positive emotions or experiences to reflect on in order to balance out negative emotions:  Patient will label two or more emotions that they find the most difficult to experience:  Patient will be able to demonstrate positive conflict resolution skills through discussion or role plays:   Summary of Patient Progress:   During group, patient and group explored the ways in which our thoughts can impact our feelings which impacts our behaviors. The patient actively engaged in the group session and shared personal strategies for regulating a range of emotions. The group, along with the facilitator, explored various situations that triggered both safe and unsafe feelings, and discussed effective methods for emotional regulation to support improved outcomes. The patient was open, supportive of peers, and receptive to feedback, contributing positively to the group dynamic.      Therapeutic Modalities:   Cognitive Behavioral Therapy Feelings Identification Dialectical Behavioral Therapy   Alveta CHRISTELLA Kerns, LCSW

## 2024-05-25 NOTE — Plan of Care (Signed)
   Problem: Education: Goal: Knowledge of [facility] Education information/materials will improve Outcome: Progressing Goal: Emotional status will improve Outcome: Progressing Goal: Mental status will improve Outcome: Progressing Goal: Verbalization of understanding the information provided will improve Outcome: Progressing   Problem: Activity: Goal: Interest or engagement in activities will improve Outcome: Progressing Goal: Sleeping patterns will improve Outcome: Progressing   Problem: Coping: Goal: Ability to verbalize frustrations and anger appropriately will improve Outcome: Progressing Goal: Ability to demonstrate self-control will improve Outcome: Progressing

## 2024-05-25 NOTE — Group Note (Signed)
 Recreation Therapy Group Note   Group Topic:General Recreation  Group Date: 05/25/2024 Start Time: 1000 End Time: 1100 Facilitators: Celestia Jeoffrey BRAVO, LRT, CTRS Location: Courtyard  Group Description: [employer]. LRT and patients played games of basketball, drew with chalk, and played corn hole while outside in the courtyard while getting fresh air and sunlight. Music was being played in the background. LRT and peers conversed about different games they have played before, what they do in their free time and anything else that is on their minds. LRT encouraged pts to drink water after being outside, sweating and getting their heart rate up.  Goal Area(s) Addressed: Patient will build on frustration tolerance skills. Patients will partake in a competitive play game with peers. Patients will gain knowledge of new leisure interest/hobby.     Affect/Mood: N/A   Participation Level: Did not attend    Clinical Observations/Individualized Feedback: Patient did not attend group.   Plan: Continue to engage patient in RT group sessions 2-3x/week.   Jeoffrey BRAVO Celestia, LRT, CTRS 05/25/2024 11:09 AM

## 2024-05-25 NOTE — Progress Notes (Signed)
 Patient was pleasant on engagement and noted to have increased presence in the milieu but with limited peers interactions.  Pt endorsed anxiety rated 4/10 but will not process about triggers.  She denied depression, AVH SI/HI, paranoia and delusional thoughts.  She remains medication compliant.   05/24/24 2029  Psych Admission Type (Psych Patients Only)  Admission Status Voluntary  Psychosocial Assessment  Patient Complaints None  Eye Contact Fair  Facial Expression Flat  Affect Appropriate to circumstance  Speech Soft  Interaction Minimal;Intrusive  Motor Activity Other (Comment) (WDL)  Appearance/Hygiene Unremarkable  Behavior Characteristics Guarded  Mood Pleasant  Aggressive Behavior  Effect No apparent injury  Thought Process  Coherency WDL  Content WDL  Delusions None reported or observed  Perception WDL  Hallucination None reported or observed  Judgment Limited  Confusion None  Danger to Self  Current suicidal ideation? Denies  Agreement Not to Harm Self Yes  Description of Agreement Verbal  Danger to Others  Danger to Others None reported or observed

## 2024-05-25 NOTE — Progress Notes (Signed)
   05/25/24 1300  Psych Admission Type (Psych Patients Only)  Admission Status Voluntary  Psychosocial Assessment  Patient Complaints None  Eye Contact Fair  Facial Expression Flat  Affect Flat  Speech Soft  Interaction Isolative;Forwards little (patient isolates to room, except for meals; patient did attend social work group this afternoon.)  Motor Activity Slow  Appearance/Hygiene Unremarkable  Behavior Characteristics Guarded  Mood Pleasant  Aggressive Behavior  Effect No apparent injury  Thought Process  Coherency WDL  Content WDL  Delusions None reported or observed  Perception WDL  Hallucination None reported or observed  Judgment WDL  Confusion None  Danger to Self  Current suicidal ideation? Denies  Self-Injurious Behavior No self-injurious ideation or behavior indicators observed or expressed   Agreement Not to Harm Self Yes  Description of Agreement Verbal  Danger to Others  Danger to Others None reported or observed

## 2024-05-25 NOTE — Plan of Care (Signed)

## 2024-05-25 NOTE — Progress Notes (Signed)
 Patient calm and pleasant during assessment denying SI/HI/AVH. Pt observed by this Clinical research associate interacting appropriately with staff and peers on the unit. Pt compliant with medication administration per MD orders. Pt given education, support, and encouragement to be active in her treatment plan. Pt being monitored Q 15 minutes for safety per unit protocol, remains safe on the unit

## 2024-05-25 NOTE — Group Note (Signed)
 Date:  05/25/2024 Time:  10:14 PM  Group Topic/Focus:  Coping With Mental Health Crisis:   The purpose of this group is to help patients identify strategies for coping with mental health crisis.  Group discusses possible causes of crisis and ways to manage them effectively.    Participation Level:  Active  Participation Quality:  Appropriate  Affect:  Appropriate  Cognitive:  Appropriate  Insight: Appropriate  Engagement in Group:  Engaged  Modes of Intervention:  Discussion  Additional Comments:    Mete Purdum L 05/25/2024, 10:14 PM

## 2024-05-26 NOTE — Group Note (Signed)
 Recreation Therapy Group Note   Group Topic:General Recreation  Group Date: 05/26/2024 Start Time: 1100 End Time: 1150 Facilitators: Celestia Jeoffrey BRAVO, LRT, CTRS Location: Courtyard  Group Description: [employer]. LRT and patients played games of basketball, drew with chalk, and played corn hole while outside in the courtyard while getting fresh air and sunlight. Music was being played in the background. LRT and peers conversed about different games they have played before, what they do in their free time and anything else that is on their minds. LRT encouraged pts to drink water after being outside, sweating and getting their heart rate up.  Goal Area(s) Addressed: Patient will build on frustration tolerance skills. Patients will partake in a competitive play game with peers. Patients will gain knowledge of new leisure interest/hobby.    Affect/Mood: N/A   Participation Level: Did not attend    Clinical Observations/Individualized Feedback: Patient did not attend group.   Plan: Continue to engage patient in RT group sessions 2-3x/week.   Jeoffrey BRAVO Celestia, LRT, CTRS 05/26/2024 11:54 AM

## 2024-05-26 NOTE — Group Note (Signed)
 Date:  05/26/2024 Time:  2:26 PM  Group Topic/Focus:  Conflict Resolution:   The focus of this group is to discuss the conflict resolution process and how it may be used upon discharge.    Participation Level:  Did Not Attend   Camellia [name] Lavera Vandermeer 05/26/2024, 2:26 PM

## 2024-05-26 NOTE — Plan of Care (Signed)

## 2024-05-26 NOTE — Progress Notes (Signed)
 [facility] MD Progress Note  05/26/2024 9:03 PM Lauren Poole  MRN:  [id number]  33 year old female who presented to the ED via EMS following a suicide attempt by overdose on 04/30/24. She reported ingesting approximately 30 Aleve pills. After ingestion, the patient did not experience vomiting or other acute physical symptoms. She reports that initially she wanted to not wake up, but later became fearful and contacted a friend, who encouraged her to seek care in the ED. The patient reports she has struggled with chronic suicidal ideation for about 1 year.  She states things have been hard but declines to elaborate on specific triggers.   Subjective:  Chart reviewed, case discussed in multidisciplinary meeting, patient seen during rounds.    10/10: On interview today, patient is laying in bed.  She is minimally engaged with provider today.  She is noted to have a flat affect.  She denies current SI/HI/plan and denies hallucinations.  She rates depression at 0/10 and anxiety is 0/10 today.  She has been compliant with Seroquel  extended release 300 mg nightly.  She reports tolerating this medication well without significant adverse effects, though does note some tiredness.  10/9: On interview today, patient is noted to be resting in bed.  She is more engaging with provider today.  She rates depression 0 out of 10 and anxiety as 2 out of 10.  She denies current SI/HI/plan and denies hallucinations.  Per social work report, patient did well in group therapy today and was actively participating.  Patient reports she is feeling good for the first time in a while, was seen interacting on the unit yesterday, patient states yesterday was a good day.  She is future oriented, thinking about where she will go upon discharge, stating that staying with her ex-boyfriend is an option but would not be her first choice.  [facility] has accepted her case.  She reports tolerating current medication regimen well without adverse  effects.  10/8: On interview today, patient is noted to be resting in bed, she is calm and cooperative.  She denies SI/HI/plan and denies hallucinations.  She has been compliant with medication.  She reports tolerating current medication regimen well without significant adverse effects, though does report some tolerable drowsiness.  She states she does not like people but when encouraged by provider to engage in unit activities, patient states she will consider this.  When asked about treatment goals, patient states she would like to not be easily triggered by things.  She states she has spent time reflecting on her triggers and how she responds to them, and would like to work on having a healthier response.  Provider discussed coping mechanisms with patient, and encouraged participation in group therapies.  When asked about discharge planning, patient states she is not sure where she will go upon hospital discharge.  Social work team working on [facility] referral.  [facility] has accepted patient's case.  10/7: On interview, patient is noted to be laying in bed looking out window.  She continues to minimize symptoms, rating depression 0 out of 10 and anxiety 0/10 today. She demonstrates poor insight into mental health condition.  She denies SI/HI/plan and denies hallucinations.  She has been compliant with Seroquel  extended release 300 mg nightly and denies adverse effects.  She does not voice any physical complaints at this time.  Provider encouraged patient to engage in treatment plan and activities on the unit.  Attempted to engage patient in conversation regarding treatment goals and future planning, but patient  does not participate meaningfully in conversation.   10/6: On interview, patient is noted to be laying in bed.  She continues to be guarded and engages minimally with provider.  She continues to display poor insight and flat affect.  She does not actively engage in discussion of her treatment plan and is  unable to discuss her treatment goals.  She has been compliant with Seroquel  XR 300 mg at bedtime and denies adverse effects.  She has not required any behavioral PRNs since 05/17/2024.  She does not voice any physical complaints at this time.  Provider encouraged patient to participate in group therapies and milieu activities. Social work team to make [facility] referral.   10/5: On interview today patient is noted to be laying in bed.  She continues to engage minimally in interview and displays a flat affect.  She continues to minimize mental health condition and displays poor insight.  She denies SI/HI/plan and denies hallucinations.  She declines to participate further in interview.  10/4: On interview today, patient is noted to be laying in bed and is minimally engaged in interview.  She rates depression as 2 out of 10 and anxiety is 2 out of 10 today she denies SI/HI/plan and denies hallucinations.  She continues to minimize symptoms and shows poor insight into her mental health condition.  She denies physical complaints.  She reports tolerating current medication regimen well and denies adverse effects.  She declines Seroquel  dose increase at this time stating she will refuse it if dose is increased.  Medication trials during this admission include lithium , Abilify , and Invega .  10/3: On interview today, patient is noted to be laying in bed.  She continues to display flat affect and be minimally engaged in interview.  She reports tolerating increased dose of Seroquel  well without adverse effects.  She rates depression as a 1 out of 10 and anxiety is at a 0 out of 10 today.  She denies SI/HI/plan and denies hallucinations.  She continues to minimize symptoms and demonstrates lack of insight into her mental health condition.  Per social work team, [facility] report has been made and they have accepted the case.  Patient does not voice any concerns or complaints today.    10/2: On interview today, patient noted to be  laying in bed, she continues to be minimally engaging during interview and is noted to have a flat affect.  She continues to lack insight into her mental health.  She required behavioral PRN yesterday after she was observed to be making gestures in the hallway and yelling at staff.  She rates depression as 3 out of 10 and anxiety at 0 out of 10 today.  She denies SI/HI/plan and denies hallucinations.  She is agreeable to Seroquel  dose increase.  10/1: On interview today, patient is noted to be laying in bed and engages minimally with the provider. She reports tolerating Seroquel  restart well without adverse effects.  Patient endorses passive SI without intent or plan.  She denies HI/plan and denies hallucinations.  Patient declines to participate in interview further.  9/30: On interview today, patient is noted to be laying in bed with a pillow covering her face.  She sits up to engage with provider.  She rates depression as a 2-3 out of 10 and anxiety as 4-5 out of 10 today.  She denies SI/HI/plan and denies hallucinations.  She is sleeping well and appetite is stable.  She states she does not like Invega  and refused dose today,  stating it has made her feel more down than even.  She requests to discontinue Invega  for this reason.  She would like to restart Seroquel , though requests to start at a lower dose than what she was previously taking which was 200 mg, stating she felt tired with 200 mg dose.  Case discussed with attending physician and social work team.  Referral to be placed to [facility].  9/29: On interview today, patient is found to be laying in bed and is minimally engaging with the provider.  She shows apathy, poor insight, and is unable to discuss her treatment goals.  Patient declined LAI.  She reports some emotional blunting with Invega  but otherwise is tolerating it well.  She states she initially found the medication beneficial in the first few days of taking it.  When asked about discharge  planning, patient states she is not sure where she will go and states she does not care where she goes, commenting [place]  When asked about support system, patient states that she does not have any friends or family.  She denies SI/HI/plan and denies hallucinations.  Patient declines to participate further in the interview.  9/28: Patient is seen for follow-up they are alert and oriented on exam.  They continue to decline LAI and indicated they will continue with the oral medication.  They continue to have poor insight into the need for medication compliance and outpatient follow-up.  They deny current SI HI and AVH.  They do not require behavioral PRNs overnight.  They have been reclusive to room.  Engaged him in discharge planning discussing options such as shelters, mother's house, or friend's house and they are reaching out to be able today it appears they will likely need to go to the shelter though.  05/13/24: Per nursing patient declined LAI this morning.  On interview she indicates she does not take any longer.  She denies SI, HI, and AVH.  She reports she is agreeable to taking the oral 3 mg dose.  She demonstrates poor insight.  Discussed her past psych records of poor medication compliance and how long-acting injectable would remove the need for her to take medication every day.  She continues to decline despite previously agreeing.  She did not require behavioral PRNs overnight they are performing ADLs.  They voiced no concerns or complaints.  Continue with some paranoia and bizarre behavior we will continue to monitor.  05/12/24: Per follow-up they are alert and oriented. They are feeling more like themself. Still some bizarre behavior discusses that they want to fast for 7 days and are unable to explain why. Then begins discussing their current hunger. They Deny SI/HI/AVH. She does laugh inappropriately at times. Notes no dizziness this morning. Risk, benefit, and  adverse effects of invega  sustenna are reviewed and pt consents to LAI. They are performing ADLS. They voice no concerns or complaints. No behavioral PRNS overnight. Act referral is being placed.   05/11/24: Per follow-up this morning they are alert and oriented.  They are pleasant and cooperative.  They continue to feel the Invega  is effective and they feel her thought processes been more linear and their mood has been more stable.  They continue to note some passive SI they deny plan or intent.  They had some dizziness this morning they are unclear on the timeframe with reference to getting the higher dose of Invega  today we will continue to monitor.  She does not appear  to be internally preoccupied nor she observed to be responding to internal stimuli.  She denies HI.  Given multiple recent hospitalizations we will continue to monitor work towards setting patient up with ACT team and progress towards LAI in the coming days.  05/10/24: Patient seen for follow up. They are alert and oriented. They are pleasant and cooperative on exam. They feel that the invega  is working well and are agreeable to continued titration. They noted some passive si today without plan or intent. Disussed medication options for depression and patient indicates she is only willing to take the Invega  at this time but that she is open to an LAI. She denies HI and AVH. Discussed ACTT services given the multiple recent hospitalizations. Sleep and appetite are stable. Had an argument with another pt during group yesterday but feels that it was because the pt was judging them. No behavioral PRNS overnight.   05/09/24: Patient seen for follow up. They are alert and oriented. They report they are not agreeable to continuing abilify . They are not agreeable to ongoing lab test required for Lithium , will discontinue both. Discussed invega  and LAI, patient is agreeable to trial of invega  with openess to transitioning to LAI. She denies SI/HI/AVH.  Notes improving sleep and stable appetite.   05/08/2024: On interview today patient is noted to be sitting up in bed.  She is more engaged in interview today.  She reports she has not started Abilify  yet but is planning to take first dose this evening.  She has started lithium  and is tolerating this well.  She endorses symptoms of depression but denies persistent anxiety.  She denies current SI/HI/plan and denies hallucinations.  She notes most recent episode of passive SI was yesterday.  She does not voice any concerns or complaints today.  She discusses desire to do something productive with her life.  05/07/2024: Per nursing report patient did not come out and remained mostly to her room.  This Clinical research associate talked with the patient and her room.  Patient reports that she got Seroquel  yesterday and still thinks sleepy because of that.  She reports that she got lithium  today in the morning for the first time and would get Abilify  tonight.  Patient continues to report having suicidal ideations.  Reports that the last time she had suicidal ideations was yesterday in the evening time and she just woke up in the morning.  Still feeling depressed and rates her depression as 5 out of 10.  Patient reports that her normal self is when she rates her depression as 2 out of 10.  Continues to report that she has thoughts of shooting herself if she would have a gun.  But she does not have any access to guns.    05/06/2024:   per staff report patient has refused to take the Seroquel  at night yesterday.  Patient is still coming out and staying in her home.  Patient on interview reports that she came suicide attempt and reports that she had 1 previous suicide attempt this year.  Patient continues to report that she is depressed.  Patient reports that it is end of road for her.  Patient reports that she does not have access to gun but if she has done she would just get it over with.  Patient remained vague but reports that she has  ongoing financial, family, life stressors.  Patient reports history consistent with bipolar 1   With manic episode lasting for 1 month but reports that these  days she is not  having manic symptoms and mostly getting depressed.   9/19: On interview today patient is noted to be seated up in bed looking towards the window.  She is more engaged in interview today than she has been in recent days.   She was compliant with Seroquel  dose last night, and states she feels that it is working.  She reports tolerating medication well without adverse effects.  She is sleeping well and reports appetite as normal.  She denies SI/HI/plan and denies hallucinations.  She rates depression as a 4 out of 10 and anxiety as a 0 out of 10 today.  She does not voice any concerns or complaints today.  9/18: On interview today patient is noted to be alert and laying in bed.  She declines to communicate with provider verbally.  Patient responds to questions during interview with a head nod or head shake.  Patient declined medication last night per nursing report.  She denies SI/HI/plan and denies hallucinations via shaking her head to indicate 'no' when asked.  She declined to answer questions regarding sleep and appetite.  When asked if tolerating medication patient nods head to indicate 'yes'.  Patient declined to participate further in interview.  9/17: On interview today patient is noted to be laying in bed with hand covering her face.  She continues to be withdrawn and minimizes symptoms.  She denies current SI/HI/plan and denies hallucinations.  She states she is tolerating initiation of Seroquel  well.  Patient declines to answer further questions.  9/16: On interview, patient is noted to be laying in bed.  Per nursing report, she has primarily been isolated to her room since her arrival on the unit.  She endorses passive SI without current intent or plan.  She denies HI/plan and denies hallucinations.  She reports she is  sleeping and eating well.  Interviewer discussed treatment options with patient to include medication.  Patient states she prefers to be restarted on Seroquel , which she has taken in the past with good response.  Patient does not voice any other complaints or concerns today.   Sleep: Good  Appetite: Fair  Past Psychiatric History: see h&P Family History:  Family History  Problem Relation Age of Onset   Schizophrenia Sister    Social History:  Social History   Substance and Sexual Activity  Alcohol Use Yes   Alcohol/week: 4.0 standard drinks of alcohol   Types: 4 Glasses of wine per week     Social History   Substance and Sexual Activity  Drug Use Not Currently   Types: Marijuana    Social History   Socioeconomic History   Marital status: Single    Spouse name: Not on file   Number of children: Not on file   Years of education: Not on file   Highest education level: Not on file  Occupational History   Not on file  Tobacco Use   Smoking status: Every Day    Current packs/day: 0.50    Average packs/day: 0.5 packs/day for 6.6 years (3.3 ttl pk-yrs)    Types: Cigarettes    Start date: 10/2017   Smokeless tobacco: Never  Vaping Use   Vaping status: Never Used  Substance and Sexual Activity   Alcohol use: Yes    Alcohol/week: 4.0 standard drinks of alcohol    Types: 4 Glasses of wine per week   Drug use: Not Currently    Types: Marijuana   Sexual activity: Yes  Birth control/protection: None  Other Topics Concern   Not on file  Social History Narrative   Not on file   Social Drivers of Health   Financial Resource Strain: Not on file  Food Insecurity: Food Insecurity Present (04/30/2024)   Hunger Vital Sign    Worried About Running Out of Food in the Last Year: Sometimes true    Ran Out of Food in the Last Year: Sometimes true  Transportation Needs: Unmet Transportation Needs (04/30/2024)   PRAPARE - Administrator, Civil Service (Medical): Yes     Lack of Transportation (Non-Medical): Yes  Physical Activity: Not on file  Stress: Not on file  Social Connections: Not on file   Past Medical History:  Past Medical History:  Diagnosis Date   Bipolar 1 disorder (HCC)    Chlamydia    Gonorrhea     Past Surgical History:  Procedure Laterality Date   NO PAST SURGERIES      Current Medications: Current Facility-Administered Medications  Medication Dose Route Frequency Provider Last Rate Last Admin   acetaminophen  (TYLENOL ) tablet 650 mg  650 mg Oral Q6H PRN Motley-Mangrum, Jadeka A, PMHNP       alum & mag hydroxide-simeth (MAALOX/MYLANTA) 200-200-20 MG/5ML suspension 30 mL  30 mL Oral Q4H PRN Motley-Mangrum, Jadeka A, PMHNP       haloperidol  (HALDOL ) tablet 5 mg  5 mg Oral TID PRN Motley-Mangrum, Jadeka A, PMHNP       And   diphenhydrAMINE  (BENADRYL ) capsule 50 mg  50 mg Oral TID PRN Motley-Mangrum, Jadeka A, PMHNP       haloperidol  lactate (HALDOL ) injection 5 mg  5 mg Intramuscular TID PRN Motley-Mangrum, Jadeka A, PMHNP       And   diphenhydrAMINE  (BENADRYL ) injection 50 mg  50 mg Intramuscular TID PRN Motley-Mangrum, Jadeka A, PMHNP       And   LORazepam  (ATIVAN ) injection 2 mg  2 mg Intramuscular TID PRN Motley-Mangrum, Jadeka A, PMHNP       haloperidol  lactate (HALDOL ) injection 10 mg  10 mg Intramuscular TID PRN Motley-Mangrum, Jadeka A, PMHNP   10 mg at 05/17/24 1233   And   diphenhydrAMINE  (BENADRYL ) injection 50 mg  50 mg Intramuscular TID PRN Motley-Mangrum, Jadeka A, PMHNP   50 mg at 05/17/24 1233   And   LORazepam  (ATIVAN ) injection 2 mg  2 mg Intramuscular TID PRN Motley-Mangrum, Jadeka A, PMHNP   2 mg at 05/17/24 1233   magnesium  hydroxide (MILK OF MAGNESIA) suspension 30 mL  30 mL Oral Daily PRN Motley-Mangrum, Jadeka A, PMHNP       QUEtiapine  (SEROQUEL  XR) 24 hr tablet 300 mg  300 mg Oral QHS Adren Dollins L, PA-C   300 mg at 05/26/24 2100   traZODone  (DESYREL ) tablet 50 mg  50 mg Oral QHS PRN Motley-Mangrum,  Jadeka A, PMHNP   50 mg at 05/14/24 2105    Lab Results:  No results found for this or any previous visit (from the past 48 hours).   Blood Alcohol level:  Lab Results  Component Value Date   [facility] <15 04/30/2024   ETH <15 04/09/2024    Metabolic Disorder Labs: Lab Results  Component Value Date   HGBA1C 5.1 10/31/2023   MPG 99.67 10/31/2023   No results found for: PROLACTIN Lab Results  Component Value Date   CHOL 136 10/31/2023   TRIG 75 10/31/2023   HDL 37 (L) 10/31/2023   CHOLHDL 3.7 10/31/2023   VLDL  15 10/31/2023   LDLCALC 84 10/31/2023    Physical Findings: AIMS:  , ,  ,  ,    CIWA:    COWS:      Musculoskeletal: Strength & Muscle Tone: within normal limits Gait & Station: normal Assets  Assets: Communication Skills  Mental Status Exam:  General Appearance: Casual Eye contact: Minimal Speech: Normal rate Speech volume: Decreased Mood: Depressed Affect: Congruent Thought process: Linear Descriptions of associations: Intact Orientation: Full (time, place and person) he Thought content: WDL Hallucinations: None Ideas of reference: None Suicidal thoughts: No Homicidal thoughts: No Memory: Immediate fair, recent fair, remote fair Judgment: Poor Insight: Poor Concentration: Fair Attention span: Fair Recall: [employer] of knowledge: Fair Language: Fair Psychomotor activity: Normal  Physical Exam: Physical Exam Vitals and nursing note reviewed.  HENT:     Head: Atraumatic.  Eyes:     Extraocular Movements: Extraocular movements intact.  Pulmonary:     Effort: Pulmonary effort is normal.  Neurological:     Mental Status: She is alert and oriented to person, place, and time.    ROS Blood pressure (!) 122/56, pulse 75, temperature 97.9 F (36.6 C), resp. rate 18, height 5' 4 (1.626 m), weight 109.3 kg, SpO2 100%. Body mass index is 41.37 kg/m.  Diagnosis: Principal Problem:   Bipolar 1 disorder (HCC)   PLAN: Safety and  Monitoring:  -- Voluntary admission to inpatient psychiatric unit for safety, stabilization and treatment  -- Daily contact with patient to assess and evaluate symptoms and progress in treatment  -- Patient's case to be discussed in multi-disciplinary team meeting  -- Observation Level : q15 minute checks  -- Vital signs:  q12 hours  -- Precautions: suicide, elopement, and assault -- Encouraged patient to participate in unit milieu and in scheduled group therapies   2. Psychiatric Diagnoses and Treatment:  Bipolar disorder  Reviewed case with attending Dr. Donnelly who was agreeable with proceeding with LAI. Reviewed dosing with pharmacy who agreed.  Patient subsequently declined LAI. D/C lithium  300 mg b.I.d. and Abilify  5 mg at bedtime.  Labs ordered for thyroid  panel and vitamin D .  Discontinued Invega  per patient request Continue Seroquel  XR 300 mg nightly               The risks/benefits/side-effects/alternatives to this medication were discussed in detail with the patient and time was given for questions. The patient consents to medication trial. -- Metabolic profile and EKG monitoring obtained while on an atypical antipsychotic  -- Encouraged patient to participate in unit milieu and in scheduled group therapies                    3. Medical Issues Being Addressed:  No medical issues identified at this time.   4. Discharge Planning:  Patient to be referred for ACT services.   [facility] has accepted case            -- Next week  -- Social work and case management to assist with discharge planning and identification of hospital follow-up needs prior to discharge  -- Estimated LOS: 1-2 weeks  Camelia LITTIE Lukes, PA-C 05/26/2024, 9:03 PM

## 2024-05-26 NOTE — Plan of Care (Signed)
   Problem: Education: Goal: Emotional status will improve Outcome: Progressing Goal: Mental status will improve Outcome: Progressing

## 2024-05-26 NOTE — Progress Notes (Signed)
 Patient was observed in the dayroom, after dinner, watching a group of patients play Spades.

## 2024-05-26 NOTE — [facility] Counselor (Signed)
 CSW assisted pt with completion of [facility] registration. At one point pt denied interest in any services moving forward. Pt was reminded that she had agreed to referral earlier in her stay. She stated that she just wanted to leave the hospital and leave all of this behind. CSW reminded pt of the number of times that had been her goal and how many times she had been in the hospital, and reminded her if we wanted a different outcome then we need to do something different. Pt continued with registration and completed it. No other concerns expressed. Contact ended without incident.   Nadara SAUNDERS. Chaim, MSW, LCSW, LCAS 05/26/2024 4:12 PM

## 2024-05-26 NOTE — Group Note (Signed)
 Recreation Therapy Group Note   Group Topic:Leisure Education  Group Date: 05/26/2024 Start Time: 1530 End Time: 1630 Facilitators: Celestia Jeoffrey BRAVO, LRT, CTRS Location: Craft Room  Group Description: Leisure. Patients were given the option to choose from journaling, coloring, drawing, making origami, playing with playdoh, listening to music or singing karaoke. LRT and pts discussed the meaning of leisure, the importance of participating in leisure during their free time/when they're outside of the hospital, as well as how our leisure interests can also serve as coping skills.   Goal Area(s) Addressed:  Patient will identify a current leisure interest.  Patient will learn the definition of "leisure". Patient will practice making a positive decision. Patient will have the opportunity to try a new leisure activity. Patient will communicate with peers and LRT.    Affect/Mood: N/A   Participation Level: Did not attend    Clinical Observations/Individualized Feedback: Patient did not attend group.   Plan: Continue to engage patient in RT group sessions 2-3x/week.   Jeoffrey BRAVO Celestia, LRT, CTRS 05/26/2024 5:00 PM

## 2024-05-26 NOTE — Progress Notes (Signed)
   05/26/24 1600  Psych Admission Type (Psych Patients Only)  Admission Status Voluntary  Psychosocial Assessment  Patient Complaints None (patient states I'm ok.)  Eye Contact Fair  Facial Expression Flat  Affect Flat  Speech Soft  Interaction Isolative;Forwards little (patient isolates to room, except for meals.)  Motor Activity Slow  Appearance/Hygiene Unremarkable  Behavior Characteristics Cooperative  Mood Pleasant  Aggressive Behavior  Effect No apparent injury  Thought Process  Coherency WDL  Content WDL  Delusions None reported or observed  Perception WDL  Hallucination None reported or observed  Judgment WDL  Confusion None  Danger to Self  Current suicidal ideation? Denies  Self-Injurious Behavior No self-injurious ideation or behavior indicators observed or expressed   Agreement Not to Harm Self Yes  Description of Agreement Verbal  Danger to Others  Danger to Others None reported or observed

## 2024-05-26 NOTE — Group Note (Signed)
 Date:  05/26/2024 Time:  12:58 PM  Group Topic/Focus:  Emotional Education:   The focus of this group is to discuss what feelings/emotions are, and how they are experienced.    Participation Level:  Did Not Attend   Lauren Poole Ryen Rhames 05/26/2024, 12:58 PM

## 2024-05-27 MED ORDER — QUETIAPINE FUMARATE ER 50 MG PO TB24
250.0000 mg | ORAL_TABLET | Freq: Every day | ORAL | Status: DC
  Administered 2024-05-27 – 2024-05-30 (×4): 250 mg via ORAL
  Filled 2024-05-27 (×4): qty 1

## 2024-05-27 NOTE — Progress Notes (Signed)
 [facility] MD Progress Note  05/26/2024 1:14 PM Lauren Poole  MRN:  [id number]  33 year old female who presented to the ED via EMS following a suicide attempt by overdose on 04/30/24. She reported ingesting approximately 30 Aleve pills. After ingestion, the patient did not experience vomiting or other acute physical symptoms. She reports that initially she wanted to not wake up, but later became fearful and contacted a friend, who encouraged her to seek care in the ED. The patient reports she has struggled with chronic suicidal ideation for about 1 year.  She states things have been hard but declines to elaborate on specific triggers.   Subjective:  Chart reviewed, case discussed in multidisciplinary meeting, patient seen during rounds.    10/11: on interview patient is laying in bed. They note the Seroquel  is making them too tired and request a reduced dose. She denies SI/HI/AVH. Rates depression 0/10. Rates anxiety 0/10. She is reclusive to the room and does not participate in the unit milieu.   10/10: On interview today, patient is laying in bed.  She is minimally engaged with provider today.  She is noted to have a flat affect.  She denies current SI/HI/plan and denies hallucinations.  She rates depression at 0/10 and anxiety is 0/10 today.  She has been compliant with Seroquel  extended release 300 mg nightly.  She reports tolerating this medication well without significant adverse effects, though does note some tiredness.  10/9: On interview today, patient is noted to be resting in bed.  She is more engaging with provider today.  She rates depression 0 out of 10 and anxiety as 2 out of 10.  She denies current SI/HI/plan and denies hallucinations.  Per social work report, patient did well in group therapy today and was actively participating.  Patient reports she is feeling good for the first time in a while, was seen interacting on the unit yesterday, patient states yesterday was a good day.   She is future oriented, thinking about where she will go upon discharge, stating that staying with her ex-boyfriend is an option but would not be her first choice.  [facility] has accepted her case.  She reports tolerating current medication regimen well without adverse effects.  10/8: On interview today, patient is noted to be resting in bed, she is calm and cooperative.  She denies SI/HI/plan and denies hallucinations.  She has been compliant with medication.  She reports tolerating current medication regimen well without significant adverse effects, though does report some tolerable drowsiness.  She states she does not like people but when encouraged by provider to engage in unit activities, patient states she will consider this.  When asked about treatment goals, patient states she would like to not be easily triggered by things.  She states she has spent time reflecting on her triggers and how she responds to them, and would like to work on having a healthier response.  Provider discussed coping mechanisms with patient, and encouraged participation in group therapies.  When asked about discharge planning, patient states she is not sure where she will go upon hospital discharge.  Social work team working on [facility] referral.  [facility] has accepted patient's case.  10/7: On interview, patient is noted to be laying in bed looking out window.  She continues to minimize symptoms, rating depression 0 out of 10 and anxiety 0/10 today. She demonstrates poor insight into mental health condition.  She denies SI/HI/plan and denies hallucinations.  She has been compliant with Seroquel  extended release  300 mg nightly and denies adverse effects.  She does not voice any physical complaints at this time.  Provider encouraged patient to engage in treatment plan and activities on the unit.  Attempted to engage patient in conversation regarding treatment goals and future planning, but patient does not participate meaningfully in  conversation.   10/6: On interview, patient is noted to be laying in bed.  She continues to be guarded and engages minimally with provider.  She continues to display poor insight and flat affect.  She does not actively engage in discussion of her treatment plan and is unable to discuss her treatment goals.  She has been compliant with Seroquel  XR 300 mg at bedtime and denies adverse effects.  She has not required any behavioral PRNs since 05/17/2024.  She does not voice any physical complaints at this time.  Provider encouraged patient to participate in group therapies and milieu activities. Social work team to make [facility] referral.   10/5: On interview today patient is noted to be laying in bed.  She continues to engage minimally in interview and displays a flat affect.  She continues to minimize mental health condition and displays poor insight.  She denies SI/HI/plan and denies hallucinations.  She declines to participate further in interview.  10/4: On interview today, patient is noted to be laying in bed and is minimally engaged in interview.  She rates depression as 2 out of 10 and anxiety is 2 out of 10 today she denies SI/HI/plan and denies hallucinations.  She continues to minimize symptoms and shows poor insight into her mental health condition.  She denies physical complaints.  She reports tolerating current medication regimen well and denies adverse effects.  She declines Seroquel  dose increase at this time stating she will refuse it if dose is increased.  Medication trials during this admission include lithium , Abilify , and Invega .  10/3: On interview today, patient is noted to be laying in bed.  She continues to display flat affect and be minimally engaged in interview.  She reports tolerating increased dose of Seroquel  well without adverse effects.  She rates depression as a 1 out of 10 and anxiety is at a 0 out of 10 today.  She denies SI/HI/plan and denies hallucinations.  She continues to  minimize symptoms and demonstrates lack of insight into her mental health condition.  Per social work team, [facility] report has been made and they have accepted the case.  Patient does not voice any concerns or complaints today.    10/2: On interview today, patient noted to be laying in bed, she continues to be minimally engaging during interview and is noted to have a flat affect.  She continues to lack insight into her mental health.  She required behavioral PRN yesterday after she was observed to be making gestures in the hallway and yelling at staff.  She rates depression as 3 out of 10 and anxiety at 0 out of 10 today.  She denies SI/HI/plan and denies hallucinations.  She is agreeable to Seroquel  dose increase.  10/1: On interview today, patient is noted to be laying in bed and engages minimally with the provider. She reports tolerating Seroquel  restart well without adverse effects.  Patient endorses passive SI without intent or plan.  She denies HI/plan and denies hallucinations.  Patient declines to participate in interview further.  9/30: On interview today, patient is noted to be laying in bed with a pillow covering her face.  She sits up to engage with  provider.  She rates depression as a 2-3 out of 10 and anxiety as 4-5 out of 10 today.  She denies SI/HI/plan and denies hallucinations.  She is sleeping well and appetite is stable.  She states she does not like Invega  and refused dose today, stating it has made her feel more down than even.  She requests to discontinue Invega  for this reason.  She would like to restart Seroquel , though requests to start at a lower dose than what she was previously taking which was 200 mg, stating she felt tired with 200 mg dose.  Case discussed with attending physician and social work team.  Referral to be placed to [facility].  9/29: On interview today, patient is found to be laying in bed and is minimally engaging with the provider.  She shows apathy, poor insight, and  is unable to discuss her treatment goals.  Patient declined LAI.  She reports some emotional blunting with Invega  but otherwise is tolerating it well.  She states she initially found the medication beneficial in the first few days of taking it.  When asked about discharge planning, patient states she is not sure where she will go and states she does not care where she goes, commenting [place]  When asked about support system, patient states that she does not have any friends or family.  She denies SI/HI/plan and denies hallucinations.  Patient declines to participate further in the interview.  9/28: Patient is seen for follow-up they are alert and oriented on exam.  They continue to decline LAI and indicated they will continue with the oral medication.  They continue to have poor insight into the need for medication compliance and outpatient follow-up.  They deny current SI HI and AVH.  They do not require behavioral PRNs overnight.  They have been reclusive to room.  Engaged him in discharge planning discussing options such as shelters, mother's house, or friend's house and they are reaching out to be able today it appears they will likely need to go to the shelter though.  05/13/24: Per nursing patient declined LAI this morning.  On interview she indicates she does not take any longer.  She denies SI, HI, and AVH.  She reports she is agreeable to taking the oral 3 mg dose.  She demonstrates poor insight.  Discussed her past psych records of poor medication compliance and how long-acting injectable would remove the need for her to take medication every day.  She continues to decline despite previously agreeing.  She did not require behavioral PRNs overnight they are performing ADLs.  They voiced no concerns or complaints.  Continue with some paranoia and bizarre behavior we will continue to monitor.  05/12/24: Per follow-up they are alert and oriented. They are feeling more like  themself. Still some bizarre behavior discusses that they want to fast for 7 days and are unable to explain why. Then begins discussing their current hunger. They Deny SI/HI/AVH. She does laugh inappropriately at times. Notes no dizziness this morning. Risk, benefit, and adverse effects of invega  sustenna are reviewed and pt consents to LAI. They are performing ADLS. They voice no concerns or complaints. No behavioral PRNS overnight. Act referral is being placed.   05/11/24: Per follow-up this morning they are alert and oriented.  They are pleasant and cooperative.  They continue to feel the Invega  is effective and they feel her thought processes been more linear and their mood has been more  stable.  They continue to note some passive SI they deny plan or intent.  They had some dizziness this morning they are unclear on the timeframe with reference to getting the higher dose of Invega  today we will continue to monitor.  She does not appear to be internally preoccupied nor she observed to be responding to internal stimuli.  She denies HI.  Given multiple recent hospitalizations we will continue to monitor work towards setting patient up with ACT team and progress towards LAI in the coming days.  05/10/24: Patient seen for follow up. They are alert and oriented. They are pleasant and cooperative on exam. They feel that the invega  is working well and are agreeable to continued titration. They noted some passive si today without plan or intent. Disussed medication options for depression and patient indicates she is only willing to take the Invega  at this time but that she is open to an LAI. She denies HI and AVH. Discussed ACTT services given the multiple recent hospitalizations. Sleep and appetite are stable. Had an argument with another pt during group yesterday but feels that it was because the pt was judging them. No behavioral PRNS overnight.   05/09/24: Patient seen for follow up. They are alert and oriented.  They report they are not agreeable to continuing abilify . They are not agreeable to ongoing lab test required for Lithium , will discontinue both. Discussed invega  and LAI, patient is agreeable to trial of invega  with openess to transitioning to LAI. She denies SI/HI/AVH. Notes improving sleep and stable appetite.   05/08/2024: On interview today patient is noted to be sitting up in bed.  She is more engaged in interview today.  She reports she has not started Abilify  yet but is planning to take first dose this evening.  She has started lithium  and is tolerating this well.  She endorses symptoms of depression but denies persistent anxiety.  She denies current SI/HI/plan and denies hallucinations.  She notes most recent episode of passive SI was yesterday.  She does not voice any concerns or complaints today.  She discusses desire to do something productive with her life.  05/07/2024: Per nursing report patient did not come out and remained mostly to her room.  This Clinical research associate talked with the patient and her room.  Patient reports that she got Seroquel  yesterday and still thinks sleepy because of that.  She reports that she got lithium  today in the morning for the first time and would get Abilify  tonight.  Patient continues to report having suicidal ideations.  Reports that the last time she had suicidal ideations was yesterday in the evening time and she just woke up in the morning.  Still feeling depressed and rates her depression as 5 out of 10.  Patient reports that her normal self is when she rates her depression as 2 out of 10.  Continues to report that she has thoughts of shooting herself if she would have a gun.  But she does not have any access to guns.    05/06/2024:   per staff report patient has refused to take the Seroquel  at night yesterday.  Patient is still coming out and staying in her home.  Patient on interview reports that she came suicide attempt and reports that she had 1 previous suicide  attempt this year.  Patient continues to report that she is depressed.  Patient reports that it is end of road for her.  Patient reports that she does not have access to gun  but if she has done she would just get it over with.  Patient remained vague but reports that she has ongoing financial, family, life stressors.  Patient reports history consistent with bipolar 1   With manic episode lasting for 1 month but reports that these days she is not  having manic symptoms and mostly getting depressed.   9/19: On interview today patient is noted to be seated up in bed looking towards the window.  She is more engaged in interview today than she has been in recent days.   She was compliant with Seroquel  dose last night, and states she feels that it is working.  She reports tolerating medication well without adverse effects.  She is sleeping well and reports appetite as normal.  She denies SI/HI/plan and denies hallucinations.  She rates depression as a 4 out of 10 and anxiety as a 0 out of 10 today.  She does not voice any concerns or complaints today.  9/18: On interview today patient is noted to be alert and laying in bed.  She declines to communicate with provider verbally.  Patient responds to questions during interview with a head nod or head shake.  Patient declined medication last night per nursing report.  She denies SI/HI/plan and denies hallucinations via shaking her head to indicate 'no' when asked.  She declined to answer questions regarding sleep and appetite.  When asked if tolerating medication patient nods head to indicate 'yes'.  Patient declined to participate further in interview.  9/17: On interview today patient is noted to be laying in bed with hand covering her face.  She continues to be withdrawn and minimizes symptoms.  She denies current SI/HI/plan and denies hallucinations.  She states she is tolerating initiation of Seroquel  well.  Patient declines to answer further questions.  9/16:  On interview, patient is noted to be laying in bed.  Per nursing report, she has primarily been isolated to her room since her arrival on the unit.  She endorses passive SI without current intent or plan.  She denies HI/plan and denies hallucinations.  She reports she is sleeping and eating well.  Interviewer discussed treatment options with patient to include medication.  Patient states she prefers to be restarted on Seroquel , which she has taken in the past with good response.  Patient does not voice any other complaints or concerns today.   Sleep: Good  Appetite: Fair  Past Psychiatric History: see h&P Family History:  Family History  Problem Relation Age of Onset   Schizophrenia Sister    Social History:  Social History   Substance and Sexual Activity  Alcohol Use Yes   Alcohol/week: 4.0 standard drinks of alcohol   Types: 4 Glasses of wine per week     Social History   Substance and Sexual Activity  Drug Use Not Currently   Types: Marijuana    Social History   Socioeconomic History   Marital status: Single    Spouse name: Not on file   Number of children: Not on file   Years of education: Not on file   Highest education level: Not on file  Occupational History   Not on file  Tobacco Use   Smoking status: Every Day    Current packs/day: 0.50    Average packs/day: 0.5 packs/day for 6.6 years (3.3 ttl pk-yrs)    Types: Cigarettes    Start date: 10/2017   Smokeless tobacco: Never  Vaping Use   Vaping status: Never Used  Substance and Sexual Activity   Alcohol use: Yes    Alcohol/week: 4.0 standard drinks of alcohol    Types: 4 Glasses of wine per week   Drug use: Not Currently    Types: Marijuana   Sexual activity: Yes    Birth control/protection: None  Other Topics Concern   Not on file  Social History Narrative   Not on file   Social Drivers of Health   Financial Resource Strain: Not on file  Food Insecurity: Food Insecurity Present (04/30/2024)    Hunger Vital Sign    Worried About Running Out of Food in the Last Year: Sometimes true    Ran Out of Food in the Last Year: Sometimes true  Transportation Needs: Unmet Transportation Needs (04/30/2024)   PRAPARE - Administrator, Civil Service (Medical): Yes    Lack of Transportation (Non-Medical): Yes  Physical Activity: Not on file  Stress: Not on file  Social Connections: Not on file   Past Medical History:  Past Medical History:  Diagnosis Date   Bipolar 1 disorder (HCC)    Chlamydia    Gonorrhea     Past Surgical History:  Procedure Laterality Date   NO PAST SURGERIES      Current Medications: Current Facility-Administered Medications  Medication Dose Route Frequency Provider Last Rate Last Admin   acetaminophen  (TYLENOL ) tablet 650 mg  650 mg Oral Q6H PRN Motley-Mangrum, Jadeka A, PMHNP       alum & mag hydroxide-simeth (MAALOX/MYLANTA) 200-200-20 MG/5ML suspension 30 mL  30 mL Oral Q4H PRN Motley-Mangrum, Jadeka A, PMHNP       haloperidol  (HALDOL ) tablet 5 mg  5 mg Oral TID PRN Motley-Mangrum, Jadeka A, PMHNP       And   diphenhydrAMINE  (BENADRYL ) capsule 50 mg  50 mg Oral TID PRN Motley-Mangrum, Jadeka A, PMHNP       haloperidol  lactate (HALDOL ) injection 5 mg  5 mg Intramuscular TID PRN Motley-Mangrum, Jadeka A, PMHNP       And   diphenhydrAMINE  (BENADRYL ) injection 50 mg  50 mg Intramuscular TID PRN Motley-Mangrum, Jadeka A, PMHNP       And   LORazepam  (ATIVAN ) injection 2 mg  2 mg Intramuscular TID PRN Motley-Mangrum, Jadeka A, PMHNP       haloperidol  lactate (HALDOL ) injection 10 mg  10 mg Intramuscular TID PRN Motley-Mangrum, Jadeka A, PMHNP   10 mg at 05/17/24 1233   And   diphenhydrAMINE  (BENADRYL ) injection 50 mg  50 mg Intramuscular TID PRN Motley-Mangrum, Jadeka A, PMHNP   50 mg at 05/17/24 1233   And   LORazepam  (ATIVAN ) injection 2 mg  2 mg Intramuscular TID PRN Motley-Mangrum, Jadeka A, PMHNP   2 mg at 05/17/24 1233   magnesium  hydroxide (MILK  OF MAGNESIA) suspension 30 mL  30 mL Oral Daily PRN Motley-Mangrum, Jadeka A, PMHNP       QUEtiapine  (SEROQUEL  XR) 24 hr tablet 300 mg  300 mg Oral QHS Hunter, Crystal L, PA-C   300 mg at 05/26/24 2100   traZODone  (DESYREL ) tablet 50 mg  50 mg Oral QHS PRN Motley-Mangrum, Jadeka A, PMHNP   50 mg at 05/14/24 2105    Lab Results:  No results found for this or any previous visit (from the past 48 hours).   Blood Alcohol level:  Lab Results  Component Value Date   [facility] <15 04/30/2024   [facility] <15 04/09/2024    Metabolic Disorder Labs: Lab Results  Component Value Date  HGBA1C 5.1 10/31/2023   MPG 99.67 10/31/2023   No results found for: PROLACTIN Lab Results  Component Value Date   CHOL 136 10/31/2023   TRIG 75 10/31/2023   HDL 37 (L) 10/31/2023   CHOLHDL 3.7 10/31/2023   VLDL 15 10/31/2023   LDLCALC 84 10/31/2023    Physical Findings: AIMS:  , ,  ,  ,    CIWA:    COWS:      Musculoskeletal: Strength & Muscle Tone: within normal limits Gait & Station: normal Assets  Assets: Communication Skills  Physical Exam: Physical Exam Vitals and nursing note reviewed.  HENT:     Head: Atraumatic.  Eyes:     Extraocular Movements: Extraocular movements intact.  Pulmonary:     Effort: Pulmonary effort is normal.  Neurological:     Mental Status: She is alert and oriented to person, place, and time.    ROS Blood pressure (!) 122/56, pulse 75, temperature 97.9 F (36.6 C), resp. rate 18, height 5' 4 (1.626 m), weight 109.3 kg, SpO2 100%. Body mass index is 41.37 kg/m.  Diagnosis: Principal Problem:   Bipolar 1 disorder (HCC)   PLAN: Safety and Monitoring:  -- Voluntary admission to inpatient psychiatric unit for safety, stabilization and treatment  -- Daily contact with patient to assess and evaluate symptoms and progress in treatment  -- Patient's case to be discussed in multi-disciplinary team meeting  -- Observation Level : q15 minute checks  -- Vital signs:   q12 hours  -- Precautions: suicide, elopement, and assault -- Encouraged patient to participate in unit milieu and in scheduled group therapies   2. Psychiatric Diagnoses and Treatment:  Bipolar disorder   D/C lithium  300 mg b.I.d. and Abilify  5 mg at bedtime.  Labs ordered for thyroid  panel and vitamin D .  Discontinued Invega  per patient request Decrease Seroquel  XR to 250 mg at bedtime due to sedation               The risks/benefits/side-effects/alternatives to this medication were discussed in detail with the patient and time was given for questions. The patient consents to medication trial. -- Metabolic profile and EKG monitoring obtained while on an atypical antipsychotic  -- Encouraged patient to participate in unit milieu and in scheduled group therapies                    3. Medical Issues Being Addressed:  No medical issues identified at this time.   4. Discharge Planning:  Patient to be referred for ACT services.   [facility] has accepted case            -- Next week  -- Social work and case management to assist with discharge planning and identification of hospital follow-up needs prior to discharge  -- Estimated LOS: 1-2 weeks  Donnice FORBES Right, PA-C 05/27/2024, 1:14 PM

## 2024-05-27 NOTE — Progress Notes (Signed)
   05/27/24 1200  Psych Admission Type (Psych Patients Only)  Admission Status Voluntary  Psychosocial Assessment  Patient Complaints None  Eye Contact Brief  Facial Expression Flat  Affect Flat  Speech Soft  Interaction Isolative;Forwards little (patient isolates to room, except for meals and sometimes to sit in the dayroom towards the end of the day.)  Motor Activity Slow  Appearance/Hygiene Unremarkable  Behavior Characteristics Cooperative;Appropriate to situation  Mood Pleasant  Aggressive Behavior  Effect No apparent injury  Thought Process  Coherency WDL  Content WDL  Delusions None reported or observed  Perception WDL  Hallucination None reported or observed  Judgment WDL  Confusion None  Danger to Self  Current suicidal ideation? Denies  Self-Injurious Behavior No self-injurious ideation or behavior indicators observed or expressed   Agreement Not to Harm Self Yes  Description of Agreement Verbal  Danger to Others  Danger to Others None reported or observed

## 2024-05-27 NOTE — Plan of Care (Signed)
  Problem: Education: Goal: Knowledge of [facility] Education information/materials will improve Outcome: Progressing   Problem: Education: Goal: Emotional status will improve Outcome: Progressing   Problem: Education: Goal: Mental status will improve Outcome: Progressing   

## 2024-05-27 NOTE — Group Note (Signed)
 Date:  05/27/2024 Time:  8:47 PM  Group Topic/Focus:  Emotional Education:   The focus of this group is to discuss what feelings/emotions are, and how they are experienced. Making Healthy Choices:   The focus of this group is to help patients identify negative/unhealthy choices they were using prior to admission and identify positive/healthier coping strategies to replace them upon discharge. Self Care:   The focus of this group is to help patients understand the importance of self-care in order to improve or restore emotional, physical, spiritual, interpersonal, and financial health.    Participation Level:  Active  Participation Quality:  Appropriate and Attentive  Affect:  Appropriate  Cognitive:  Alert, Appropriate, and Oriented  Insight: Appropriate and Good  Engagement in Group:  Engaged  Modes of Intervention:  Discussion and Support  Additional Comments:  N/A  Butler LITTIE Gelineau 05/27/2024, 8:47 PM

## 2024-05-27 NOTE — BH IP Treatment Plan (Signed)
 Interdisciplinary Treatment and Diagnostic Plan Update  05/27/2024 Time of Session: 2:45 pm Lauren Poole MRN: [id number]  Principal Diagnosis: Bipolar 1 disorder (HCC)  Secondary Diagnoses: Principal Problem:   Bipolar 1 disorder (HCC)   Current Medications:  Current Facility-Administered Medications  Medication Dose Route Frequency Provider Last Rate Last Admin   acetaminophen  (TYLENOL ) tablet 650 mg  650 mg Oral Q6H PRN Motley-Mangrum, Jadeka A, PMHNP       alum & mag hydroxide-simeth (MAALOX/MYLANTA) 200-200-20 MG/5ML suspension 30 mL  30 mL Oral Q4H PRN Motley-Mangrum, Jadeka A, PMHNP       haloperidol  (HALDOL ) tablet 5 mg  5 mg Oral TID PRN Motley-Mangrum, Jadeka A, PMHNP       And   diphenhydrAMINE  (BENADRYL ) capsule 50 mg  50 mg Oral TID PRN Motley-Mangrum, Jadeka A, PMHNP       haloperidol  lactate (HALDOL ) injection 5 mg  5 mg Intramuscular TID PRN Motley-Mangrum, Jadeka A, PMHNP       And   diphenhydrAMINE  (BENADRYL ) injection 50 mg  50 mg Intramuscular TID PRN Motley-Mangrum, Jadeka A, PMHNP       And   LORazepam  (ATIVAN ) injection 2 mg  2 mg Intramuscular TID PRN Motley-Mangrum, Jadeka A, PMHNP       haloperidol  lactate (HALDOL ) injection 10 mg  10 mg Intramuscular TID PRN Motley-Mangrum, Jadeka A, PMHNP   10 mg at 05/17/24 1233   And   diphenhydrAMINE  (BENADRYL ) injection 50 mg  50 mg Intramuscular TID PRN Motley-Mangrum, Jadeka A, PMHNP   50 mg at 05/17/24 1233   And   LORazepam  (ATIVAN ) injection 2 mg  2 mg Intramuscular TID PRN Motley-Mangrum, Jadeka A, PMHNP   2 mg at 05/17/24 1233   magnesium  hydroxide (MILK OF MAGNESIA) suspension 30 mL  30 mL Oral Daily PRN Motley-Mangrum, Jadeka A, PMHNP       QUEtiapine  (SEROQUEL  XR) 24 hr tablet 250 mg  250 mg Oral QHS Millington, Matthew E, PA-C       traZODone  (DESYREL ) tablet 50 mg  50 mg Oral QHS PRN Motley-Mangrum, Jadeka A, PMHNP   50 mg at 05/14/24 2105   PTA Medications: Medications Prior to Admission   Medication Sig Dispense Refill Last Dose/Taking   cholecalciferol  (CHOLECALCIFEROL ) 25 MCG tablet Take 1 tablet (1,000 Units total) by mouth daily. (Patient taking differently: Take 1,000 Units by mouth every 3 (three) days.) 30 tablet 0    naproxen sodium (ALEVE) 220 MG tablet Take 440 mg by mouth daily as needed (pain).      traZODone  (DESYREL ) 100 MG tablet Take 1 tablet (100 mg total) by mouth at bedtime. (Patient not taking: Reported on 04/30/2024) 30 tablet 0     Patient Stressors: Financial difficulties    Patient Strengths: Manufacturing systems engineer   Treatment Modalities: Medication Management, Group therapy, Case management,  1 to 1 session with clinician, Psychoeducation, Recreational therapy.   Physician Treatment Plan for Primary Diagnosis: Bipolar 1 disorder (HCC) Long Term Goal(s): Improvement in symptoms so as ready for discharge   Short Term Goals: Ability to identify changes in lifestyle to reduce recurrence of condition will improve Ability to verbalize feelings will improve Ability to disclose and discuss suicidal ideas Ability to identify and develop effective coping behaviors will improve  Medication Management: Evaluate patient's response, side effects, and tolerance of medication regimen.  Therapeutic Interventions: 1 to 1 sessions, Unit Group sessions and Medication administration.  Evaluation of Outcomes: Progressing  Physician Treatment Plan for Secondary Diagnosis: Principal Problem:  Bipolar 1 disorder (HCC)  Long Term Goal(s): Improvement in symptoms so as ready for discharge   Short Term Goals: Ability to identify changes in lifestyle to reduce recurrence of condition will improve Ability to verbalize feelings will improve Ability to disclose and discuss suicidal ideas Ability to identify and develop effective coping behaviors will improve     Medication Management: Evaluate patient's response, side effects, and tolerance of medication  regimen.  Therapeutic Interventions: 1 to 1 sessions, Unit Group sessions and Medication administration.  Evaluation of Outcomes: Progressing   RN Treatment Plan for Primary Diagnosis: Bipolar 1 disorder (HCC) Long Term Goal(s): Knowledge of disease and therapeutic regimen to maintain health will improve  Short Term Goals: Ability to remain free from injury will improve, Ability to verbalize frustration and anger appropriately will improve, Ability to demonstrate self-control, Ability to participate in decision making will improve, Ability to verbalize feelings will improve, Ability to disclose and discuss suicidal ideas, Ability to identify and develop effective coping behaviors will improve, and Compliance with prescribed medications will improve  Medication Management: RN will administer medications as ordered by provider, will assess and evaluate patient's response and provide education to patient for prescribed medication. RN will report any adverse and/or side effects to prescribing provider.  Therapeutic Interventions: 1 on 1 counseling sessions, Psychoeducation, Medication administration, Evaluate responses to treatment, Monitor vital signs and CBGs as ordered, Perform/monitor CIWA, COWS, AIMS and Fall Risk screenings as ordered, Perform wound care treatments as ordered.  Evaluation of Outcomes: Progressing   LCSW Treatment Plan for Primary Diagnosis: Bipolar 1 disorder (HCC) Long Term Goal(s): Safe transition to appropriate next level of care at discharge, Engage patient in therapeutic group addressing interpersonal concerns.  Short Term Goals: Engage patient in aftercare planning with referrals and resources, Increase social support, Increase ability to appropriately verbalize feelings, Increase emotional regulation, Facilitate acceptance of mental health diagnosis and concerns, Identify triggers associated with mental health/substance abuse issues, and Increase skills for wellness  and recovery  Therapeutic Interventions: Assess for all discharge needs, 1 to 1 time with Social worker, Explore available resources and support systems, Assess for adequacy in community support network, Educate family and significant other(s) on suicide prevention, Complete Psychosocial Assessment, Interpersonal group therapy.  Evaluation of Outcomes: Progressing   Progress in Treatment: Attending groups: Yes. Participating in groups: Yes. Taking medication as prescribed: Yes. Toleration medication: Yes. Family/Significant other contact made: No, will contact:  once provided permission Patient understands diagnosis: Yes. Discussing patient identified problems/goals with staff: Yes. Medical problems stabilized or resolved: Yes. Denies suicidal/homicidal ideation: Yes. Issues/concerns per patient self-inventory: No. Other: None  New problem(s) identified: No, Describe:  none identified. Update 05/07/24: No changes at this time. Update 05/12/24: No changes at this time. Update 05/17/24: No changes at this time. Update 05/22/24: No changes at this time. Update 05/27/24: No changes at this time.    New Short Term/Long Term Goal(s): elimination of symptoms of psychosis, medication management for mood stabilization; elimination of SI thoughts; development of comprehensive mental wellness/sobriety plan. Update 05/07/24: No changes at this time. Update 05/12/24: No changes at this time. Update 05/17/24: No changes at this time. Update 05/22/24: No changes at this time. Update 05/27/24: No changes at this time.    Patient Goals: To go home.   Update 05/07/24: No changes at this time. Update 05/12/24: No changes at this time. Update 05/17/24: No changes at this time. Update 05/22/24: No changes at this time. Update 05/27/24: No changes at this time.  Discharge Plan or Barriers: CSW will assist pt with development of an appropriate aftercare/discharge plan.   Update 05/07/24: No changes at this time.  Update 05/12/24: No changes at this time. Update 05/17/24: Pt has stopped taking her medication and stopped attending group. She had to receive agitation protocol today due to aggressive behavior on unit. Update 05/22/24: APS report has been made due to concerns of pt being able to care for herself due to poor judgement.   Update 05/27/24: No changes at this time.    Reason for Continuation of Hospitalization: Aggression Medication stabilization   Estimated Length of Stay: 1-7 days  Update 05/07/24: No changes at this time. Update 05/12/24: TBD. Update 05/17/24: TBD Update 05/22/24: TBD  Update 05/27/24: TBD  Last 3 [place] Suicide Severity Risk Score: Flowsheet Row Admission (Current) from 04/30/2024 in [facility] INPATIENT BEHAVIORAL MEDICINE Most recent reading at 04/30/2024  6:00 PM ED from 04/30/2024 in [facility] Emergency Department at [facility] Most recent reading at 04/30/2024  1:04 AM Admission (Discharged) from 04/09/2024 in [facility] INPATIENT BEH[facility]IORAL MEDICINE Most recent reading at 04/09/2024  6:00 PM  C-SSRS RISK CATEGORY High Risk High Risk Low Risk    Last PHQ 2/9 Scores:     No data to display          Scribe for Treatment Team: Roselyn GORMAN Lento, LCSW 05/27/2024 2:48 PM

## 2024-05-27 NOTE — Group Note (Signed)
 Date:  05/27/2024 Time:  4:25 PM  Group Topic/Focus:  Healthy Communication:   The focus of this group is to discuss communication, barriers to communication, as well as healthy ways to communicate with others.    Participation Level:  Did Not Attend   Lauren Poole 05/27/2024, 4:25 PM

## 2024-05-27 NOTE — Progress Notes (Signed)
   05/27/24 0500  Psych Admission Type (Psych Patients Only)  Admission Status Voluntary  Psychosocial Assessment  Patient Complaints Anxiety  Eye Contact Brief  Facial Expression Flat  Affect Flat  Speech Soft  Interaction Defensive;Forwards little  Motor Activity Slow  Appearance/Hygiene Unremarkable  Behavior Characteristics Cooperative;Appropriate to situation  Mood Depressed  Thought Process  Coherency WDL  Content WDL  Delusions WDL  Perception WDL  Hallucination None reported or observed  Judgment WDL  Confusion WDL   Patient alert and oriented x 4, no distress noted, thoughts are disorganized, speech is tangential she denies SI/HI/AVH 15 minutes safety checks maintained, will continue to monitor .

## 2024-05-27 NOTE — Plan of Care (Signed)

## 2024-05-28 NOTE — Group Note (Signed)
 LCSW Group Therapy Note  Group Date: 05/28/2024 Start Time: 1300 End Time: 1400   Type of Therapy and Topic:  Group Therapy: Positive Affirmations  Participation Level:  Did Not Attend   Description of Group:   This group addressed positive affirmation towards self and others.  Patients went around the room and identified two positive things about themselves and two positive things about a peer in the room.  Patients reflected on how it felt to share something positive with others, to identify positive things about themselves, and to hear positive things from others/ Patients were encouraged to have a daily reflection of positive characteristics or circumstances.   Therapeutic Goals: Patients will verbalize two of their positive qualities Patients will demonstrate empathy for others by stating two positive qualities about a peer in the group Patients will verbalize their feelings when voicing positive self affirmations and when voicing positive affirmations of others Patients will discuss the potential positive impact on their wellness/recovery of focusing on positive traits of self and others.  Summary of Patient Progress:  Patient did not attend.   Therapeutic Modalities:   Cognitive Behavioral Therapy Motivational Interviewing    Alveta CHRISTELLA Kerns, [place] 05/28/2024  2:58 PM

## 2024-05-28 NOTE — Progress Notes (Signed)
 [facility] MD Progress Note  05/26/2024 9:13 AM Lauren Poole  MRN:  [id number]  33 year old female who presented to the ED via EMS following a suicide attempt by overdose on 04/30/24. She reported ingesting approximately 30 Aleve pills. After ingestion, the patient did not experience vomiting or other acute physical symptoms. She reports that initially she wanted to not wake up, but later became fearful and contacted a friend, who encouraged her to seek care in the ED. The patient reports she has struggled with chronic suicidal ideation for about 1 year.  She states things have been hard but declines to elaborate on specific triggers.   Subjective:  Chart reviewed, case discussed in multidisciplinary meeting, patient seen during rounds.    10/12 : On follow-up patient is alert and oriented.  They are pleasant and cooperative on exam.  They deny adverse effects of medications.  They deny SI, HI, and AVH.  They voiced no concerns or complaints at this time.  Seroquel  was lowered yesterday as patient felt too sedated throughout the day. They are doing well this morning. Discussed discharge and going to shelter they reported they want to go to their friend John's house. They are encouraged to participate in the unit milieu, but continue to remain reclusive to the room. They rate depression and anxiety at 0/10. Discussed crisis resources, and coping skills.   10/11: on interview patient is laying in bed. They note the Seroquel  is making them too tired and request a reduced dose. She denies SI/HI/AVH. Rates depression 0/10. Rates anxiety 0/10. She is reclusive to the room and does not participate in the unit milieu.   10/10: On interview today, patient is laying in bed.  She is minimally engaged with provider today.  She is noted to have a flat affect.  She denies current SI/HI/plan and denies hallucinations.  She rates depression at 0/10 and anxiety is 0/10 today.  She has been compliant with Seroquel   extended release 300 mg nightly.  She reports tolerating this medication well without significant adverse effects, though does note some tiredness.  10/9: On interview today, patient is noted to be resting in bed.  She is more engaging with provider today.  She rates depression 0 out of 10 and anxiety as 2 out of 10.  She denies current SI/HI/plan and denies hallucinations.  Per social work report, patient did well in group therapy today and was actively participating.  Patient reports she is feeling good for the first time in a while, was seen interacting on the unit yesterday, patient states yesterday was a good day.  She is future oriented, thinking about where she will go upon discharge, stating that staying with her ex-boyfriend is an option but would not be her first choice.  [facility] has accepted her case.  She reports tolerating current medication regimen well without adverse effects.  10/8: On interview today, patient is noted to be resting in bed, she is calm and cooperative.  She denies SI/HI/plan and denies hallucinations.  She has been compliant with medication.  She reports tolerating current medication regimen well without significant adverse effects, though does report some tolerable drowsiness.  She states she does not like people but when encouraged by provider to engage in unit activities, patient states she will consider this.  When asked about treatment goals, patient states she would like to not be easily triggered by things.  She states she has spent time reflecting on her triggers and how she responds to them, and would  like to work on having a healthier response.  Provider discussed coping mechanisms with patient, and encouraged participation in group therapies.  When asked about discharge planning, patient states she is not sure where she will go upon hospital discharge.  Social work team working on [facility] referral.  [facility] has accepted patient's case.  10/7: On interview, patient is noted  to be laying in bed looking out window.  She continues to minimize symptoms, rating depression 0 out of 10 and anxiety 0/10 today. She demonstrates poor insight into mental health condition.  She denies SI/HI/plan and denies hallucinations.  She has been compliant with Seroquel  extended release 300 mg nightly and denies adverse effects.  She does not voice any physical complaints at this time.  Provider encouraged patient to engage in treatment plan and activities on the unit.  Attempted to engage patient in conversation regarding treatment goals and future planning, but patient does not participate meaningfully in conversation.   10/6: On interview, patient is noted to be laying in bed.  She continues to be guarded and engages minimally with provider.  She continues to display poor insight and flat affect.  She does not actively engage in discussion of her treatment plan and is unable to discuss her treatment goals.  She has been compliant with Seroquel  XR 300 mg at bedtime and denies adverse effects.  She has not required any behavioral PRNs since 05/17/2024.  She does not voice any physical complaints at this time.  Provider encouraged patient to participate in group therapies and milieu activities. Social work team to make [facility] referral.   10/5: On interview today patient is noted to be laying in bed.  She continues to engage minimally in interview and displays a flat affect.  She continues to minimize mental health condition and displays poor insight.  She denies SI/HI/plan and denies hallucinations.  She declines to participate further in interview.  10/4: On interview today, patient is noted to be laying in bed and is minimally engaged in interview.  She rates depression as 2 out of 10 and anxiety is 2 out of 10 today she denies SI/HI/plan and denies hallucinations.  She continues to minimize symptoms and shows poor insight into her mental health condition.  She denies physical complaints.  She reports  tolerating current medication regimen well and denies adverse effects.  She declines Seroquel  dose increase at this time stating she will refuse it if dose is increased.  Medication trials during this admission include lithium , Abilify , and Invega .  10/3: On interview today, patient is noted to be laying in bed.  She continues to display flat affect and be minimally engaged in interview.  She reports tolerating increased dose of Seroquel  well without adverse effects.  She rates depression as a 1 out of 10 and anxiety is at a 0 out of 10 today.  She denies SI/HI/plan and denies hallucinations.  She continues to minimize symptoms and demonstrates lack of insight into her mental health condition.  Per social work team, [facility] report has been made and they have accepted the case.  Patient does not voice any concerns or complaints today.    10/2: On interview today, patient noted to be laying in bed, she continues to be minimally engaging during interview and is noted to have a flat affect.  She continues to lack insight into her mental health.  She required behavioral PRN yesterday after she was observed to be making gestures in the hallway and yelling at staff.  She rates depression as 3 out of 10 and anxiety at 0 out of 10 today.  She denies SI/HI/plan and denies hallucinations.  She is agreeable to Seroquel  dose increase.  10/1: On interview today, patient is noted to be laying in bed and engages minimally with the provider. She reports tolerating Seroquel  restart well without adverse effects.  Patient endorses passive SI without intent or plan.  She denies HI/plan and denies hallucinations.  Patient declines to participate in interview further.  9/30: On interview today, patient is noted to be laying in bed with a pillow covering her face.  She sits up to engage with provider.  She rates depression as a 2-3 out of 10 and anxiety as 4-5 out of 10 today.  She denies SI/HI/plan and denies hallucinations.  She is  sleeping well and appetite is stable.  She states she does not like Invega  and refused dose today, stating it has made her feel more down than even.  She requests to discontinue Invega  for this reason.  She would like to restart Seroquel , though requests to start at a lower dose than what she was previously taking which was 200 mg, stating she felt tired with 200 mg dose.  Case discussed with attending physician and social work team.  Referral to be placed to [facility].  9/29: On interview today, patient is found to be laying in bed and is minimally engaging with the provider.  She shows apathy, poor insight, and is unable to discuss her treatment goals.  Patient declined LAI.  She reports some emotional blunting with Invega  but otherwise is tolerating it well.  She states she initially found the medication beneficial in the first few days of taking it.  When asked about discharge planning, patient states she is not sure where she will go and states she does not care where she goes, [place]  When asked about support system, patient states that she does not have any friends or family.  She denies SI/HI/plan and denies hallucinations.  Patient declines to participate further in the interview.  9/28: Patient is seen for follow-up they are alert and oriented on exam.  They continue to decline LAI and indicated they will continue with the oral medication.  They continue to have poor insight into the need for medication compliance and outpatient follow-up.  They deny current SI HI and AVH.  They do not require behavioral PRNs overnight.  They have been reclusive to room.  Engaged him in discharge planning discussing options such as shelters, mother's house, or friend's house and they are reaching out to be able today it appears they will likely need to go to the shelter though.  05/13/24: Per nursing patient declined LAI this morning.  On interview she indicates she does not take  any longer.  She denies SI, HI, and AVH.  She reports she is agreeable to taking the oral 3 mg dose.  She demonstrates poor insight.  Discussed her past psych records of poor medication compliance and how long-acting injectable would remove the need for her to take medication every day.  She continues to decline despite previously agreeing.  She did not require behavioral PRNs overnight they are performing ADLs.  They voiced no concerns or complaints.  Continue with some paranoia and bizarre behavior we will continue to monitor.  05/12/24: Per follow-up they are alert and oriented. They are feeling more like themself. Still some bizarre behavior discusses that  they want to fast for 7 days and are unable to explain why. Then begins discussing their current hunger. They Deny SI/HI/AVH. She does laugh inappropriately at times. Notes no dizziness this morning. Risk, benefit, and adverse effects of invega  sustenna are reviewed and pt consents to LAI. They are performing ADLS. They voice no concerns or complaints. No behavioral PRNS overnight. Act referral is being placed.   05/11/24: Per follow-up this morning they are alert and oriented.  They are pleasant and cooperative.  They continue to feel the Invega  is effective and they feel her thought processes been more linear and their mood has been more stable.  They continue to note some passive SI they deny plan or intent.  They had some dizziness this morning they are unclear on the timeframe with reference to getting the higher dose of Invega  today we will continue to monitor.  She does not appear to be internally preoccupied nor she observed to be responding to internal stimuli.  She denies HI.  Given multiple recent hospitalizations we will continue to monitor work towards setting patient up with ACT team and progress towards LAI in the coming days.  05/10/24: Patient seen for follow up. They are alert and oriented. They are pleasant and cooperative on exam. They  feel that the invega  is working well and are agreeable to continued titration. They noted some passive si today without plan or intent. Disussed medication options for depression and patient indicates she is only willing to take the Invega  at this time but that she is open to an LAI. She denies HI and AVH. Discussed ACTT services given the multiple recent hospitalizations. Sleep and appetite are stable. Had an argument with another pt during group yesterday but feels that it was because the pt was judging them. No behavioral PRNS overnight.   05/09/24: Patient seen for follow up. They are alert and oriented. They report they are not agreeable to continuing abilify . They are not agreeable to ongoing lab test required for Lithium , will discontinue both. Discussed invega  and LAI, patient is agreeable to trial of invega  with openess to transitioning to LAI. She denies SI/HI/AVH. Notes improving sleep and stable appetite.   05/08/2024: On interview today patient is noted to be sitting up in bed.  She is more engaged in interview today.  She reports she has not started Abilify  yet but is planning to take first dose this evening.  She has started lithium  and is tolerating this well.  She endorses symptoms of depression but denies persistent anxiety.  She denies current SI/HI/plan and denies hallucinations.  She notes most recent episode of passive SI was yesterday.  She does not voice any concerns or complaints today.  She discusses desire to do something productive with her life.  05/07/2024: Per nursing report patient did not come out and remained mostly to her room.  This Clinical research associate talked with the patient and her room.  Patient reports that she got Seroquel  yesterday and still thinks sleepy because of that.  She reports that she got lithium  today in the morning for the first time and would get Abilify  tonight.  Patient continues to report having suicidal ideations.  Reports that the last time she had suicidal  ideations was yesterday in the evening time and she just woke up in the morning.  Still feeling depressed and rates her depression as 5 out of 10.  Patient reports that her normal self is when she rates her depression as 2 out of 10.  Continues to report that she has thoughts of shooting herself if she would have a gun.  But she does not have any access to guns.    05/06/2024:   per staff report patient has refused to take the Seroquel  at night yesterday.  Patient is still coming out and staying in her home.  Patient on interview reports that she came suicide attempt and reports that she had 1 previous suicide attempt this year.  Patient continues to report that she is depressed.  Patient reports that it is end of road for her.  Patient reports that she does not have access to gun but if she has done she would just get it over with.  Patient remained vague but reports that she has ongoing financial, family, life stressors.  Patient reports history consistent with bipolar 1   With manic episode lasting for 1 month but reports that these days she is not  having manic symptoms and mostly getting depressed.   9/19: On interview today patient is noted to be seated up in bed looking towards the window.  She is more engaged in interview today than she has been in recent days.   She was compliant with Seroquel  dose last night, and states she feels that it is working.  She reports tolerating medication well without adverse effects.  She is sleeping well and reports appetite as normal.  She denies SI/HI/plan and denies hallucinations.  She rates depression as a 4 out of 10 and anxiety as a 0 out of 10 today.  She does not voice any concerns or complaints today.  9/18: On interview today patient is noted to be alert and laying in bed.  She declines to communicate with provider verbally.  Patient responds to questions during interview with a head nod or head shake.  Patient declined medication last night per nursing  report.  She denies SI/HI/plan and denies hallucinations via shaking her head to indicate 'no' when asked.  She declined to answer questions regarding sleep and appetite.  When asked if tolerating medication patient nods head to indicate 'yes'.  Patient declined to participate further in interview.  9/17: On interview today patient is noted to be laying in bed with hand covering her face.  She continues to be withdrawn and minimizes symptoms.  She denies current SI/HI/plan and denies hallucinations.  She states she is tolerating initiation of Seroquel  well.  Patient declines to answer further questions.  9/16: On interview, patient is noted to be laying in bed.  Per nursing report, she has primarily been isolated to her room since her arrival on the unit.  She endorses passive SI without current intent or plan.  She denies HI/plan and denies hallucinations.  She reports she is sleeping and eating well.  Interviewer discussed treatment options with patient to include medication.  Patient states she prefers to be restarted on Seroquel , which she has taken in the past with good response.  Patient does not voice any other complaints or concerns today.   Sleep: Good  Appetite: Fair  Past Psychiatric History: see h&P Family History:  Family History  Problem Relation Age of Onset   Schizophrenia Sister    Social History:  Social History   Substance and Sexual Activity  Alcohol Use Yes   Alcohol/week: 4.0 standard drinks of alcohol   Types: 4 Glasses of wine per week     Social History   Substance and Sexual Activity  Drug Use Not Currently   Types: Marijuana  Social History   Socioeconomic History   Marital status: Single    Spouse name: Not on file   Number of children: Not on file   Years of education: Not on file   Highest education level: Not on file  Occupational History   Not on file  Tobacco Use   Smoking status: Every Day    Current packs/day: 0.50    Average packs/day:  0.5 packs/day for 6.6 years (3.3 ttl pk-yrs)    Types: Cigarettes    Start date: 10/2017   Smokeless tobacco: Never  Vaping Use   Vaping status: Never Used  Substance and Sexual Activity   Alcohol use: Yes    Alcohol/week: 4.0 standard drinks of alcohol    Types: 4 Glasses of wine per week   Drug use: Not Currently    Types: Marijuana   Sexual activity: Yes    Birth control/protection: None  Other Topics Concern   Not on file  Social History Narrative   Not on file   Social Drivers of Health   Financial Resource Strain: Not on file  Food Insecurity: Food Insecurity Present (04/30/2024)   Hunger Vital Sign    Worried About Running Out of Food in the Last Year: Sometimes true    Ran Out of Food in the Last Year: Sometimes true  Transportation Needs: Unmet Transportation Needs (04/30/2024)   PRAPARE - Administrator, Civil Service (Medical): Yes    Lack of Transportation (Non-Medical): Yes  Physical Activity: Not on file  Stress: Not on file  Social Connections: Not on file   Past Medical History:  Past Medical History:  Diagnosis Date   Bipolar 1 disorder (HCC)    Chlamydia    Gonorrhea     Past Surgical History:  Procedure Laterality Date   NO PAST SURGERIES      Current Medications: Current Facility-Administered Medications  Medication Dose Route Frequency Provider Last Rate Last Admin   acetaminophen  (TYLENOL ) tablet 650 mg  650 mg Oral Q6H PRN Motley-Mangrum, Jadeka A, PMHNP       alum & mag hydroxide-simeth (MAALOX/MYLANTA) 200-200-20 MG/5ML suspension 30 mL  30 mL Oral Q4H PRN Motley-Mangrum, Jadeka A, PMHNP       haloperidol  (HALDOL ) tablet 5 mg  5 mg Oral TID PRN Motley-Mangrum, Jadeka A, PMHNP       And   diphenhydrAMINE  (BENADRYL ) capsule 50 mg  50 mg Oral TID PRN Motley-Mangrum, Jadeka A, PMHNP       haloperidol  lactate (HALDOL ) injection 5 mg  5 mg Intramuscular TID PRN Motley-Mangrum, Jadeka A, PMHNP       And   diphenhydrAMINE  (BENADRYL )  injection 50 mg  50 mg Intramuscular TID PRN Motley-Mangrum, Jadeka A, PMHNP       And   LORazepam  (ATIVAN ) injection 2 mg  2 mg Intramuscular TID PRN Motley-Mangrum, Jadeka A, PMHNP       haloperidol  lactate (HALDOL ) injection 10 mg  10 mg Intramuscular TID PRN Motley-Mangrum, Jadeka A, PMHNP   10 mg at 05/17/24 1233   And   diphenhydrAMINE  (BENADRYL ) injection 50 mg  50 mg Intramuscular TID PRN Motley-Mangrum, Jadeka A, PMHNP   50 mg at 05/17/24 1233   And   LORazepam  (ATIVAN ) injection 2 mg  2 mg Intramuscular TID PRN Motley-Mangrum, Jadeka A, PMHNP   2 mg at 05/17/24 1233   magnesium  hydroxide (MILK OF MAGNESIA) suspension 30 mL  30 mL Oral Daily PRN Motley-Mangrum, Jadeka A, PMHNP  QUEtiapine  (SEROQUEL  XR) 24 hr tablet 250 mg  250 mg Oral QHS Marshae Azam E, PA-C   250 mg at 05/27/24 2109   traZODone  (DESYREL ) tablet 50 mg  50 mg Oral QHS PRN Motley-Mangrum, Jadeka A, PMHNP   50 mg at 05/14/24 2105    Lab Results:  No results found for this or any previous visit (from the past 48 hours).   Blood Alcohol level:  Lab Results  Component Value Date   [facility] <15 04/30/2024   ETH <15 04/09/2024    Metabolic Disorder Labs: Lab Results  Component Value Date   HGBA1C 5.1 10/31/2023   MPG 99.67 10/31/2023   No results found for: PROLACTIN Lab Results  Component Value Date   CHOL 136 10/31/2023   TRIG 75 10/31/2023   HDL 37 (L) 10/31/2023   CHOLHDL 3.7 10/31/2023   VLDL 15 10/31/2023   LDLCALC 84 10/31/2023    Physical Findings: AIMS:  , ,  ,  ,    CIWA:    COWS:      Musculoskeletal: Strength & Muscle Tone: within normal limits Gait & Station: normal Assets  Assets: Communication Skills  Physical Exam: Physical Exam Vitals and nursing note reviewed.  HENT:     Head: Atraumatic.  Eyes:     Extraocular Movements: Extraocular movements intact.  Pulmonary:     Effort: Pulmonary effort is normal.  Neurological:     Mental Status: She is alert and  oriented to person, place, and time.    ROS Blood pressure 111/73, pulse 77, temperature (!) 97.2 F (36.2 C), resp. rate 18, height 5' 4 (1.626 m), weight 109.3 kg, SpO2 100%. Body mass index is 41.37 kg/m.  Diagnosis: Principal Problem:   Bipolar 1 disorder (HCC)   PLAN: Safety and Monitoring:  -- Voluntary admission to inpatient psychiatric unit for safety, stabilization and treatment  -- Daily contact with patient to assess and evaluate symptoms and progress in treatment  -- Patient's case to be discussed in multi-disciplinary team meeting  -- Observation Level : q15 minute checks  -- Vital signs:  q12 hours  -- Precautions: suicide, elopement, and assault -- Encouraged patient to participate in unit milieu and in scheduled group therapies   2. Psychiatric Diagnoses and Treatment:  Bipolar disorder   During this admission pt had trials of lithium , abilify , and invega . She initially agreed to these trials and later declined the meds was not agreeable to the lab work for lithium , and did not want LAIs of the other medication. Pt requested to be transitioned back to seroquel . Yesterday seroquel  was decreased due to sedation.   Decrease Seroquel  XR to 250 mg at bedtime due to sedation               The risks/benefits/side-effects/alternatives to this medication were discussed in detail with the patient and time was given for questions. The patient consents to medication trial. -- Metabolic profile and EKG monitoring obtained while on an atypical antipsychotic  -- Encouraged patient to participate in unit milieu and in scheduled group therapies                    3. Medical Issues Being Addressed:  No medical issues identified at this time.   4. Discharge Planning:  Patient to be referred for ACT services.   [facility] has accepted case Possibly shelter or friends house, see if act can come evaluate here.             --  Tues/Wed  -- Social work and case management to assist with  discharge planning and identification of hospital follow-up needs prior to discharge  -- Estimated LOS: 1-2 weeks  Donnice FORBES Right, PA-C 05/28/2024, 9:13 AM

## 2024-05-28 NOTE — Plan of Care (Signed)
  Problem: Education: Goal: Knowledge of [facility] Education information/materials will improve Outcome: Progressing   Problem: Education: Goal: Emotional status will improve Outcome: Progressing   Problem: Education: Goal: Mental status will improve Outcome: Progressing   

## 2024-05-28 NOTE — Progress Notes (Signed)
   05/28/24 1205  Psych Admission Type (Psych Patients Only)  Admission Status Voluntary  Psychosocial Assessment  Patient Complaints None  Eye Contact Fair  Facial Expression Animated  Affect Flat  Speech Soft  Interaction Isolative;Forwards little  Motor Activity Slow  Appearance/Hygiene Unremarkable  Behavior Characteristics Cooperative;Appropriate to situation  Mood Pleasant  Thought Process  Coherency WDL  Content WDL  Delusions None reported or observed  Perception WDL  Hallucination None reported or observed  Judgment WDL  Confusion None  Danger to Self  Current suicidal ideation? Denies  Agreement Not to Harm Self Yes  Description of Agreement Verbal  Danger to Others  Danger to Others None reported or observed

## 2024-05-28 NOTE — Plan of Care (Signed)
   Problem: Education: Goal: Emotional status will improve Outcome: Progressing Goal: Mental status will improve Outcome: Progressing Goal: Verbalization of understanding the information provided will improve Outcome: Progressing

## 2024-05-28 NOTE — Progress Notes (Signed)
   05/27/24 2300  Psych Admission Type (Psych Patients Only)  Admission Status Voluntary  Psychosocial Assessment  Patient Complaints None  Eye Contact Brief  Facial Expression Flat  Affect Flat  Speech Soft  Interaction Defensive;Forwards little  Motor Activity Slow  Appearance/Hygiene Unremarkable  Behavior Characteristics Cooperative;Appropriate to situation  Mood Pleasant  Thought Process  Coherency WDL  Content WDL  Delusions WDL  Perception WDL  Hallucination None reported or observed  Judgment WDL  Confusion WDL  Danger to Self  Current suicidal ideation? Denies  Self-Injurious Behavior No self-injurious ideation or behavior indicators observed or expressed   Agreement Not to Harm Self Yes  Description of Agreement verbal  Danger to Others  Danger to Others None reported or observed   No distress noted interacting appropriately with peers and staff, affect is blunted, she was appropriate no bizarre behavior, she denies SI/HI/AVH, 15 miutes safety checks maintained.

## 2024-05-29 NOTE — Plan of Care (Signed)

## 2024-05-29 NOTE — [facility] Suicide Risk Assessment (Signed)
 [facility] INPATIENT:  Family/Significant Other Suicide Prevention Education  Suicide Prevention Education:  Education Completed; Ethel Acre, [phone], Ex-Partner,  has been identified by the patient as the family member/significant other with whom the patient will be residing, and identified as the person(s) who will aid the patient in the event of a mental health crisis (suicidal ideations/suicide attempt).  With written consent from the patient, the family member/significant other has been provided the following suicide prevention education, prior to the and/or following the discharge of the patient.  The suicide prevention education provided includes the following: Suicide risk factors Suicide prevention and interventions National Suicide Hotline telephone number [facility] assessment telephone number [facility] Emergency Assistance 911 [facility] and/or Residential Mobile Crisis Unit telephone number  Request made of family/significant other to: Remove weapons (e.g., guns, rifles, knives), all items previously/currently identified as safety concern.   Remove drugs/medications (over-the-counter, prescriptions, illicit drugs), all items previously/currently identified as a safety concern.  The family member/significant other verbalizes understanding of the suicide prevention education information provided.  The family member/significant other agrees to remove the items of safety concern listed above.  Ex-partner confirmed that the patient can discharge to his home. He expressed no safety concerns but had further questions for the provider. This has been communicated to the provider who has made arrangements to touch base. Partner confirmed that there are no weapons in the home.   Lauren Poole 05/29/2024, 4:25 PM

## 2024-05-29 NOTE — Progress Notes (Signed)
   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 Denies  Self-Injurious Behavior No self-injurious ideation or behavior indicators observed or expressed   Agreement Not to Harm Self Yes  Description of Agreement Verbal  Danger to Others  Danger to Others None reported or observed   Patient had no stated goals for today, but is focused on discharge.

## 2024-05-29 NOTE — Group Note (Signed)
 Date:  05/29/2024 Time:  10:02 AM  Group Topic/Focus:  Goals Group:   The focus of this group is to help patients establish daily goals to achieve during treatment and discuss how the patient can incorporate goal setting into their daily lives to aide in recovery.    Participation Level:  Did Not Attend   Lauren Poole 05/29/2024, 10:02 AM

## 2024-05-29 NOTE — Group Note (Signed)
 LCSW Group Therapy Note  Group Date: 05/29/2024 Start Time: 1315 End Time: 1400   Type of Therapy and Topic:  Group Therapy - How To Cope with Nervousness about Discharge   Participation Level:  Did Not Attend   Description of Group This process group involved identification of patients' feelings about discharge. Some of them are scheduled to be discharged soon, while others are new admissions, but each of them was asked to share thoughts and feelings surrounding discharge from the hospital. One common theme was that they are excited at the prospect of going home, while another was that many of them are apprehensive about sharing why they were hospitalized. Patients were given the opportunity to discuss these feelings with their peers in preparation for discharge.  Therapeutic Goals  Patient will identify their overall feelings about pending discharge. Patient will think about how they might proactively address issues that they believe will once again arise once they get home (i.e. with parents). Patients will participate in discussion about having hope for change.   Summary of Patient Progress:  Patient declined to attend group.   Therapeutic Modalities Cognitive Behavioral Therapy   Sherryle JINNY Margo, LCSW 05/29/2024  3:00 PM

## 2024-05-29 NOTE — Group Note (Signed)
 Date:  05/29/2024 Time:  6:39 PM  Group Topic/Focus:  Activity Group :  The focus of the group is to promote activity for the patients and encourage them to go outside    Participation Level:  Active  Participation Quality:  Appropriate  Affect:  Appropriate  Cognitive:  Appropriate  Insight: Appropriate  Engagement in Group:  Engaged  Modes of Intervention:  Activity  Additional Comments:    Camellia [name] Janaa Acero 05/29/2024, 6:39 PM

## 2024-05-29 NOTE — Group Note (Signed)
 Date:  05/29/2024 Time:  8:54 PM  Group Topic/Focus:  Recovery Goals:   The focus of this group is to identify appropriate goals for recovery and establish a plan to achieve them.    Participation Level:  Active  Participation Quality:  Appropriate, Attentive, and Sharing  Affect:  Appropriate  Cognitive:  Alert and Appropriate  Insight: Appropriate, Good, and Improving  Engagement in Group:  Developing/Improving  Modes of Intervention:  Clarification, Discussion, Education, Problem-solving, Rapport Building, Dance movement psychotherapist, and Support  Additional Comments:     Manford Sprong 05/29/2024, 8:54 PM

## 2024-05-29 NOTE — Plan of Care (Signed)
  Problem: Education: Goal: Mental status will improve Outcome: Progressing   Problem: Education: Goal: Emotional status will improve Outcome: Progressing   Problem: Education: Goal: Verbalization of understanding the information provided will improve Outcome: Progressing

## 2024-05-29 NOTE — Group Note (Signed)
 Date:  05/29/2024 Time:  2:13 AM  Group Topic/Focus:  Managing Feelings:   The focus of this group is to identify what feelings patients have difficulty handling and develop a plan to handle them in a healthier way upon discharge. Self Care:   The focus of this group is to help patients understand the importance of self-care in order to improve or restore emotional, physical, spiritual, interpersonal, and financial health.    Participation Level:  Active  Participation Quality:  Appropriate and Attentive  Affect:  Appropriate  Cognitive:  Alert, Appropriate, and Oriented  Insight: Appropriate and Good  Engagement in Group:  Engaged  Modes of Intervention:  Discussion and Support  Additional Comments:  N/A  Lauren Poole 05/29/2024, 2:13 AM

## 2024-05-29 NOTE — Progress Notes (Signed)
   05/28/24 2000  Psych Admission Type (Psych Patients Only)  Admission Status Voluntary  Psychosocial Assessment  Patient Complaints None  Eye Contact Brief  Facial Expression Flat  Affect Flat  Speech Soft  Interaction Defensive;Forwards little  Motor Activity Slow  Appearance/Hygiene Unremarkable  Behavior Characteristics Cooperative;Appropriate to situation  Mood Pleasant  Thought Process  Coherency WDL  Content WDL  Delusions WDL  Perception WDL  Hallucination None reported or observed  Judgment WDL  Confusion WDL  Danger to Self  Current suicidal ideation? Denies  Self-Injurious Behavior No self-injurious ideation or behavior indicators observed or expressed   Agreement Not to Harm Self Yes  Description of Agreement verbal  Danger to Others  Danger to Others None reported or observed   No distress noted, 15 minutes safety checks maintained, thoughts are organized and coherent, he denies SI/HI/AVH.

## 2024-05-29 NOTE — Progress Notes (Signed)
 Pt pleasant during 1:1 with staff, denied SI/HI, AVH, depression and anxiety.  Informed staff that she went outside for activities today, and participated and she is feeling good.  Pt attended and participated in wrapup group.  She took scheduled bedtime medications.   05/29/24 2200  Psych Admission Type (Psych Patients Only)  Admission Status Voluntary  Psychosocial Assessment  Patient Complaints None  Eye Contact Avertive  Facial Expression Animated  Affect Appropriate to circumstance  Speech Logical/coherent  Interaction Assertive  Motor Activity Other (Comment) (WDL)  Appearance/Hygiene Unremarkable  Behavior Characteristics Appropriate to situation;Cooperative  Mood Pleasant  Aggressive Behavior  Effect No apparent injury  Thought Process  Coherency WDL  Content WDL  Delusions None reported or observed  Perception WDL  Hallucination None reported or observed  Judgment WDL  Confusion None  Danger to Self  Current suicidal ideation? Denies  Agreement Not to Harm Self Yes  Description of Agreement Verbal  Danger to Others  Danger to Others None reported or observed

## 2024-05-29 NOTE — Group Note (Signed)
 Recreation Therapy Group Note   Group Topic:Healthy Support Systems  Group Date: 05/29/2024 Start Time: 1015 End Time: 1100 Facilitators: Celestia Jeoffrey BRAVO, LRT, CTRS Location: Craft Room  Group Description: Straw Bridge. In groups or individually, patients were given 10 plastic drinking straws and an equal length of masking tape. Using the materials provided, patients were instructed to build a free-standing bridge-like structure to suspend an everyday item (ex: deck of cards) off the floor or table surface. All materials were required to be used in Secondary school teacher. LRT facilitated post-activity discussion reviewing the importance of having strong and healthy support systems in our lives. LRT discussed how the people in our lives serve as the tape and the deck of cards we placed on top of our straw structure are the stressors we face in daily life. LRT and pts discussed what happens in our life when things get too heavy for us , and we don't have strong supports outside of the hospital. Pt shared 2 of their healthy supports in their life aloud in the group.   Goal Area(s) Addressed:  Patient will identify 2 healthy supports in their life. Patient will identify skills to successfully complete activity. Patient will identify correlation of this activity to life post-discharge.  Patient will build on frustration tolerance skills. Patient will increase team building and communication skills.    Affect/Mood: N/A   Participation Level: Did not attend    Clinical Observations/Individualized Feedback: Patient did not attend group.   Plan: Continue to engage patient in RT group sessions 2-3x/week.   Jeoffrey BRAVO Celestia, LRT, CTRS 05/29/2024 1:09 PM

## 2024-05-29 NOTE — Progress Notes (Addendum)
 [facility] MD Progress Note  05/29/2024 12:41 PM Lauren Poole  MRN:  [id number]  33 year old female who presented to the ED via EMS following a suicide attempt by overdose on 04/30/24. She reported ingesting approximately 30 Aleve pills. After ingestion, the patient did not experience vomiting or other acute physical symptoms. She reports that initially she wanted to not wake up, but later became fearful and contacted a friend, who encouraged her to seek care in the ED. The patient reports she has struggled with chronic suicidal ideation for about 1 year.  She states things have been hard but declines to elaborate on specific triggers.   Subjective:  Chart reviewed, case discussed in multidisciplinary meeting, patient seen during rounds.    10/13: On interview today patient is noted to be pleasant, cooperative, alert and oriented.  She continues to be medication compliant, and is tolerating medication regimen well without adverse effects.  She rates depression as 0 out of 10 and anxiety as 2 out of 10 today.  She denies SI/HI/AVH.  She reports sleeping well and stable appetite.  She is able to discuss coping mechanisms, social support, and crisis resources.  She is future oriented, stating she has worked out a plan to stay with her friend, Norleen upon discharge.  Patient gives consent for provider to contact John at [phone] for discharge planning.  She does not voice any concerns or complaints today.  Per social work team, ACT team will be set up with [employer].  10/12 : On follow-up patient is alert and oriented.  They are pleasant and cooperative on exam.  They deny adverse effects of medications.  They deny SI, HI, and AVH.  They voiced no concerns or complaints at this time.  Seroquel  was lowered yesterday as patient felt too sedated throughout the day. They are doing well this morning. Discussed discharge and going to shelter they reported they want to go to their friend John's house. They are  encouraged to participate in the unit milieu, but continue to remain reclusive to the room. They rate depression and anxiety at 0/10. Discussed crisis resources, and coping skills.   10/11: on interview patient is laying in bed. They note the Seroquel  is making them too tired and request a reduced dose. She denies SI/HI/AVH. Rates depression 0/10. Rates anxiety 0/10. She is reclusive to the room and does not participate in the unit milieu.   10/10: On interview today, patient is laying in bed.  She is minimally engaged with provider today.  She is noted to have a flat affect.  She denies current SI/HI/plan and denies hallucinations.  She rates depression at 0/10 and anxiety is 0/10 today.  She has been compliant with Seroquel  extended release 300 mg nightly.  She reports tolerating this medication well without significant adverse effects, though does note some tiredness.  10/9: On interview today, patient is noted to be resting in bed.  She is more engaging with provider today.  She rates depression 0 out of 10 and anxiety as 2 out of 10.  She denies current SI/HI/plan and denies hallucinations.  Per social work report, patient did well in group therapy today and was actively participating.  Patient reports she is feeling good for the first time in a while, was seen interacting on the unit yesterday, patient states yesterday was a good day.  She is future oriented, thinking about where she will go upon discharge, stating that staying with her ex-boyfriend is an option but would not be her  first choice.  [facility] has accepted her case.  She reports tolerating current medication regimen well without adverse effects.  10/8: On interview today, patient is noted to be resting in bed, she is calm and cooperative.  She denies SI/HI/plan and denies hallucinations.  She has been compliant with medication.  She reports tolerating current medication regimen well without significant adverse effects, though does report some  tolerable drowsiness.  She states she does not like people but when encouraged by provider to engage in unit activities, patient states she will consider this.  When asked about treatment goals, patient states she would like to not be easily triggered by things.  She states she has spent time reflecting on her triggers and how she responds to them, and would like to work on having a healthier response.  Provider discussed coping mechanisms with patient, and encouraged participation in group therapies.  When asked about discharge planning, patient states she is not sure where she will go upon hospital discharge.  Social work team working on [facility] referral.  [facility] has accepted patient's case.  10/7: On interview, patient is noted to be laying in bed looking out window.  She continues to minimize symptoms, rating depression 0 out of 10 and anxiety 0/10 today. She demonstrates poor insight into mental health condition.  She denies SI/HI/plan and denies hallucinations.  She has been compliant with Seroquel  extended release 300 mg nightly and denies adverse effects.  She does not voice any physical complaints at this time.  Provider encouraged patient to engage in treatment plan and activities on the unit.  Attempted to engage patient in conversation regarding treatment goals and future planning, but patient does not participate meaningfully in conversation.   10/6: On interview, patient is noted to be laying in bed.  She continues to be guarded and engages minimally with provider.  She continues to display poor insight and flat affect.  She does not actively engage in discussion of her treatment plan and is unable to discuss her treatment goals.  She has been compliant with Seroquel  XR 300 mg at bedtime and denies adverse effects.  She has not required any behavioral PRNs since 05/17/2024.  She does not voice any physical complaints at this time.  Provider encouraged patient to participate in group therapies and  milieu activities. Social work team to make [facility] referral.   10/5: On interview today patient is noted to be laying in bed.  She continues to engage minimally in interview and displays a flat affect.  She continues to minimize mental health condition and displays poor insight.  She denies SI/HI/plan and denies hallucinations.  She declines to participate further in interview.  10/4: On interview today, patient is noted to be laying in bed and is minimally engaged in interview.  She rates depression as 2 out of 10 and anxiety is 2 out of 10 today she denies SI/HI/plan and denies hallucinations.  She continues to minimize symptoms and shows poor insight into her mental health condition.  She denies physical complaints.  She reports tolerating current medication regimen well and denies adverse effects.  She declines Seroquel  dose increase at this time stating she will refuse it if dose is increased.  Medication trials during this admission include lithium , Abilify , and Invega .  10/3: On interview today, patient is noted to be laying in bed.  She continues to display flat affect and be minimally engaged in interview.  She reports tolerating increased dose of Seroquel  well without adverse effects.  She rates depression as a 1 out of 10 and anxiety is at a 0 out of 10 today.  She denies SI/HI/plan and denies hallucinations.  She continues to minimize symptoms and demonstrates lack of insight into her mental health condition.  Per social work team, [facility] report has been made and they have accepted the case.  Patient does not voice any concerns or complaints today.    10/2: On interview today, patient noted to be laying in bed, she continues to be minimally engaging during interview and is noted to have a flat affect.  She continues to lack insight into her mental health.  She required behavioral PRN yesterday after she was observed to be making gestures in the hallway and yelling at staff.  She rates depression as 3  out of 10 and anxiety at 0 out of 10 today.  She denies SI/HI/plan and denies hallucinations.  She is agreeable to Seroquel  dose increase.  10/1: On interview today, patient is noted to be laying in bed and engages minimally with the provider. She reports tolerating Seroquel  restart well without adverse effects.  Patient endorses passive SI without intent or plan.  She denies HI/plan and denies hallucinations.  Patient declines to participate in interview further.  9/30: On interview today, patient is noted to be laying in bed with a pillow covering her face.  She sits up to engage with provider.  She rates depression as a 2-3 out of 10 and anxiety as 4-5 out of 10 today.  She denies SI/HI/plan and denies hallucinations.  She is sleeping well and appetite is stable.  She states she does not like Invega  and refused dose today, stating it has made her feel more down than even.  She requests to discontinue Invega  for this reason.  She would like to restart Seroquel , though requests to start at a lower dose than what she was previously taking which was 200 mg, stating she felt tired with 200 mg dose.  Case discussed with attending physician and social work team.  Referral to be placed to [facility]PS.  9/29: On interview today, patient is found to be laying in bed and is minimally engaging with the provider.  She shows apathy, poor insight, and is unable to discuss her treatment goals.  Patient declined LAI.  She reports some emotional blunting with Invega  but otherwise is tolerating it well.  She states she initially found the medication beneficial in the first few days of taking it.  When asked about discharge planning, patient states she is not sure where she will go and states she does not care where she goes, [place]  When asked about support system, patient states that she does not have any friends or family.  She denies SI/HI/plan and denies hallucinations.  Patient  declines to participate further in the interview.  9/28: Patient is seen for follow-up they are alert and oriented on exam.  They continue to decline LAI and indicated they will continue with the oral medication.  They continue to have poor insight into the need for medication compliance and outpatient follow-up.  They deny current SI HI and AVH.  They do not require behavioral PRNs overnight.  They have been reclusive to room.  Engaged him in discharge planning discussing options such as shelters, mother's house, or friend's house and they are reaching out to be able today it appears they will likely need to go to the shelter though.  05/13/24: Per nursing patient declined LAI this morning.  On interview she indicates she does not take any longer.  She denies SI, HI, and AVH.  She reports she is agreeable to taking the oral 3 mg dose.  She demonstrates poor insight.  Discussed her past psych records of poor medication compliance and how long-acting injectable would remove the need for her to take medication every day.  She continues to decline despite previously agreeing.  She did not require behavioral PRNs overnight they are performing ADLs.  They voiced no concerns or complaints.  Continue with some paranoia and bizarre behavior we will continue to monitor.  05/12/24: Per follow-up they are alert and oriented. They are feeling more like themself. Still some bizarre behavior discusses that they want to fast for 7 days and are unable to explain why. Then begins discussing their current hunger. They Deny SI/HI/AVH. She does laugh inappropriately at times. Notes no dizziness this morning. Risk, benefit, and adverse effects of invega  sustenna are reviewed and pt consents to LAI. They are performing ADLS. They voice no concerns or complaints. No behavioral PRNS overnight. Act referral is being placed.   05/11/24: Per follow-up this morning they are alert and oriented.  They are pleasant and cooperative.  They  continue to feel the Invega  is effective and they feel her thought processes been more linear and their mood has been more stable.  They continue to note some passive SI they deny plan or intent.  They had some dizziness this morning they are unclear on the timeframe with reference to getting the higher dose of Invega  today we will continue to monitor.  She does not appear to be internally preoccupied nor she observed to be responding to internal stimuli.  She denies HI.  Given multiple recent hospitalizations we will continue to monitor work towards setting patient up with ACT team and progress towards LAI in the coming days.  05/10/24: Patient seen for follow up. They are alert and oriented. They are pleasant and cooperative on exam. They feel that the invega  is working well and are agreeable to continued titration. They noted some passive si today without plan or intent. Disussed medication options for depression and patient indicates she is only willing to take the Invega  at this time but that she is open to an LAI. She denies HI and AVH. Discussed ACTT services given the multiple recent hospitalizations. Sleep and appetite are stable. Had an argument with another pt during group yesterday but feels that it was because the pt was judging them. No behavioral PRNS overnight.   05/09/24: Patient seen for follow up. They are alert and oriented. They report they are not agreeable to continuing abilify . They are not agreeable to ongoing lab test required for Lithium , will discontinue both. Discussed invega  and LAI, patient is agreeable to trial of invega  with openess to transitioning to LAI. She denies SI/HI/AVH. Notes improving sleep and stable appetite.   05/08/2024: On interview today patient is noted to be sitting up in bed.  She is more engaged in interview today.  She reports she has not started Abilify  yet but is planning to take first dose this evening.  She has started lithium  and is tolerating this well.   She endorses symptoms of depression but denies persistent anxiety.  She denies current SI/HI/plan and denies hallucinations.  She notes most recent episode of passive SI was yesterday.  She does not voice any concerns or complaints today.  She discusses desire to  do something productive with her life.  05/07/2024: Per nursing report patient did not come out and remained mostly to her room.  This Clinical research associate talked with the patient and her room.  Patient reports that she got Seroquel  yesterday and still thinks sleepy because of that.  She reports that she got lithium  today in the morning for the first time and would get Abilify  tonight.  Patient continues to report having suicidal ideations.  Reports that the last time she had suicidal ideations was yesterday in the evening time and she just woke up in the morning.  Still feeling depressed and rates her depression as 5 out of 10.  Patient reports that her normal self is when she rates her depression as 2 out of 10.  Continues to report that she has thoughts of shooting herself if she would have a gun.  But she does not have any access to guns.    05/06/2024:   per staff report patient has refused to take the Seroquel  at night yesterday.  Patient is still coming out and staying in her home.  Patient on interview reports that she came suicide attempt and reports that she had 1 previous suicide attempt this year.  Patient continues to report that she is depressed.  Patient reports that it is end of road for her.  Patient reports that she does not have access to gun but if she has done she would just get it over with.  Patient remained vague but reports that she has ongoing financial, family, life stressors.  Patient reports history consistent with bipolar 1   With manic episode lasting for 1 month but reports that these days she is not  having manic symptoms and mostly getting depressed.   9/19: On interview today patient is noted to be seated up in bed looking  towards the window.  She is more engaged in interview today than she has been in recent days.   She was compliant with Seroquel  dose last night, and states she feels that it is working.  She reports tolerating medication well without adverse effects.  She is sleeping well and reports appetite as normal.  She denies SI/HI/plan and denies hallucinations.  She rates depression as a 4 out of 10 and anxiety as a 0 out of 10 today.  She does not voice any concerns or complaints today.  9/18: On interview today patient is noted to be alert and laying in bed.  She declines to communicate with provider verbally.  Patient responds to questions during interview with a head nod or head shake.  Patient declined medication last night per nursing report.  She denies SI/HI/plan and denies hallucinations via shaking her head to indicate 'no' when asked.  She declined to answer questions regarding sleep and appetite.  When asked if tolerating medication patient nods head to indicate 'yes'.  Patient declined to participate further in interview.  9/17: On interview today patient is noted to be laying in bed with hand covering her face.  She continues to be withdrawn and minimizes symptoms.  She denies current SI/HI/plan and denies hallucinations.  She states she is tolerating initiation of Seroquel  well.  Patient declines to answer further questions.  9/16: On interview, patient is noted to be laying in bed.  Per nursing report, she has primarily been isolated to her room since her arrival on the unit.  She endorses passive SI without current intent or plan.  She denies HI/plan and denies hallucinations.  She reports  she is sleeping and eating well.  Interviewer discussed treatment options with patient to include medication.  Patient states she prefers to be restarted on Seroquel , which she has taken in the past with good response.  Patient does not voice any other complaints or concerns today.   Sleep: Good  Appetite:  Good  Past Psychiatric History: see h&P Family History:  Family History  Problem Relation Age of Onset   Schizophrenia Sister    Social History:  Social History   Substance and Sexual Activity  Alcohol Use Yes   Alcohol/week: 4.0 standard drinks of alcohol   Types: 4 Glasses of wine per week     Social History   Substance and Sexual Activity  Drug Use Not Currently   Types: Marijuana    Social History   Socioeconomic History   Marital status: Single    Spouse name: Not on file   Number of children: Not on file   Years of education: Not on file   Highest education level: Not on file  Occupational History   Not on file  Tobacco Use   Smoking status: Every Day    Current packs/day: 0.50    Average packs/day: 0.5 packs/day for 6.6 years (3.3 ttl pk-yrs)    Types: Cigarettes    Start date: 10/2017   Smokeless tobacco: Never  Vaping Use   Vaping status: Never Used  Substance and Sexual Activity   Alcohol use: Yes    Alcohol/week: 4.0 standard drinks of alcohol    Types: 4 Glasses of wine per week   Drug use: Not Currently    Types: Marijuana   Sexual activity: Yes    Birth control/protection: None  Other Topics Concern   Not on file  Social History Narrative   Not on file   Social Drivers of Health   Financial Resource Strain: Not on file  Food Insecurity: Food Insecurity Present (04/30/2024)   Hunger Vital Sign    Worried About Running Out of Food in the Last Year: Sometimes true    Ran Out of Food in the Last Year: Sometimes true  Transportation Needs: Unmet Transportation Needs (04/30/2024)   PRAPARE - Administrator, Civil Service (Medical): Yes    Lack of Transportation (Non-Medical): Yes  Physical Activity: Not on file  Stress: Not on file  Social Connections: Not on file   Past Medical History:  Past Medical History:  Diagnosis Date   Bipolar 1 disorder (HCC)    Chlamydia    Gonorrhea     Past Surgical History:  Procedure  Laterality Date   NO PAST SURGERIES      Current Medications: Current Facility-Administered Medications  Medication Dose Route Frequency Provider Last Rate Last Admin   acetaminophen  (TYLENOL ) tablet 650 mg  650 mg Oral Q6H PRN Motley-Mangrum, Jadeka A, PMHNP       alum & mag hydroxide-simeth (MAALOX/MYLANTA) 200-200-20 MG/5ML suspension 30 mL  30 mL Oral Q4H PRN Motley-Mangrum, Jadeka A, PMHNP       haloperidol  (HALDOL ) tablet 5 mg  5 mg Oral TID PRN Motley-Mangrum, Jadeka A, PMHNP       And   diphenhydrAMINE  (BENADRYL ) capsule 50 mg  50 mg Oral TID PRN Motley-Mangrum, Jadeka A, PMHNP       haloperidol  lactate (HALDOL ) injection 5 mg  5 mg Intramuscular TID PRN Motley-Mangrum, Jadeka A, PMHNP       And   diphenhydrAMINE  (BENADRYL ) injection 50 mg  50 mg Intramuscular TID PRN  Motley-Mangrum, Jadeka A, PMHNP       And   LORazepam  (ATIVAN ) injection 2 mg  2 mg Intramuscular TID PRN Motley-Mangrum, Jadeka A, PMHNP       haloperidol  lactate (HALDOL ) injection 10 mg  10 mg Intramuscular TID PRN Motley-Mangrum, Jadeka A, PMHNP   10 mg at 05/17/24 1233   And   diphenhydrAMINE  (BENADRYL ) injection 50 mg  50 mg Intramuscular TID PRN Motley-Mangrum, Jadeka A, PMHNP   50 mg at 05/17/24 1233   And   LORazepam  (ATIVAN ) injection 2 mg  2 mg Intramuscular TID PRN Motley-Mangrum, Jadeka A, PMHNP   2 mg at 05/17/24 1233   magnesium  hydroxide (MILK OF MAGNESIA) suspension 30 mL  30 mL Oral Daily PRN Motley-Mangrum, Jadeka A, PMHNP       QUEtiapine  (SEROQUEL  XR) 24 hr tablet 250 mg  250 mg Oral QHS Millington, Matthew E, PA-C   250 mg at 05/28/24 2147   traZODone  (DESYREL ) tablet 50 mg  50 mg Oral QHS PRN Motley-Mangrum, Jadeka A, PMHNP   50 mg at 05/14/24 2105    Lab Results:  No results found for this or any previous visit (from the past 48 hours).   Blood Alcohol level:  Lab Results  Component Value Date   [facility] <15 04/30/2024   ETH <15 04/09/2024    Metabolic Disorder Labs: Lab Results   Component Value Date   HGBA1C 5.1 10/31/2023   MPG 99.67 10/31/2023   No results found for: PROLACTIN Lab Results  Component Value Date   CHOL 136 10/31/2023   TRIG 75 10/31/2023   HDL 37 (L) 10/31/2023   CHOLHDL 3.7 10/31/2023   VLDL 15 10/31/2023   LDLCALC 84 10/31/2023    Physical Findings: AIMS:  , ,  ,  ,    CIWA:    COWS:      Musculoskeletal: Strength & Muscle Tone: within normal limits Gait & Station: normal Assets  Assets: Communication Skills  Physical Exam: Physical Exam Vitals and nursing note reviewed.  HENT:     Head: Atraumatic.  Eyes:     Extraocular Movements: Extraocular movements intact.  Pulmonary:     Effort: Pulmonary effort is normal.  Neurological:     Mental Status: She is alert and oriented to person, place, and time.    ROS Blood pressure 111/73, pulse 77, temperature (!) 97.2 F (36.2 C), resp. rate 18, height 5' 4 (1.626 m), weight 109.3 kg, SpO2 100%. Body mass index is 41.37 kg/m.  Diagnosis: Principal Problem:   Bipolar 1 disorder (HCC)   PLAN: Safety and Monitoring:  -- Voluntary admission to inpatient psychiatric unit for safety, stabilization and treatment  -- Daily contact with patient to assess and evaluate symptoms and progress in treatment  -- Patient's case to be discussed in multi-disciplinary team meeting  -- Observation Level : q15 minute checks  -- Vital signs:  q12 hours  -- Precautions: suicide, elopement, and assault -- Encouraged patient to participate in unit milieu and in scheduled group therapies   2. Psychiatric Diagnoses and Treatment:  Bipolar disorder   During this admission pt had trials of lithium , abilify , and invega . She initially agreed to these trials and later declined the meds was not agreeable to the lab work for lithium , and did not want LAIs of the other medication. Pt requested to be transitioned back to seroquel .   Continue Seroquel  XR 250 mg at bedtime, dose decreased on 10/11  due to sedation  The risks/benefits/side-effects/alternatives to this medication were discussed in detail with the patient and time was given for questions. The patient consents to medication trial. -- Metabolic profile and EKG monitoring obtained while on an atypical antipsychotic  -- Encouraged patient to participate in unit milieu and in scheduled group therapies                    3. Medical Issues Being Addressed:  No medical issues identified at this time.   4. Discharge Planning:  Patient to be referred for ACT services with [facility]. APS has accepted case Possibly shelter or friends house, see if act can come evaluate here.             -- Wednesday  -- Social work and case management to assist with discharge planning and identification of hospital follow-up needs prior to discharge  -- Estimated LOS: 4-5 weeks  Camelia LITTIE Lukes, PA-C 05/29/2024, 12:41 PM

## 2024-05-30 ENCOUNTER — Other Ambulatory Visit: Payer: Self-pay

## 2024-05-30 MED ORDER — QUETIAPINE FUMARATE ER 50 MG PO TB24
250.0000 mg | ORAL_TABLET | Freq: Every day | ORAL | 0 refills | Status: AC
  Filled 2024-05-30: qty 75, 15d supply, fill #0

## 2024-05-30 NOTE — [facility] Counselor (Addendum)
 CSW spoke with [facility] APS SW, Christopher PENNER. He was updated regarding pt's discharge plans. He endorsed plans to continue following pt on outpatient basis. No other concerns expressed. Contact ended without incident.   CSW met with pt to discuss discharge plans. Pt still plans to go to her ex-boyfriend's upon discharge. She reported that she needs transportation to get there. Pt has clothes to wear upon discharge. Pt denied any use of tobacco products or substance use or need for cessation services. No other concerns expressed. Contact ended without incident.   Nadara SAUNDERS. Chaim, MSW, LCSW, LCAS 05/30/2024 3:41 PM

## 2024-05-30 NOTE — Progress Notes (Signed)
 Patient calm and pleasant during assessment denying SI/HI/AVH. Pt observed by this Clinical research associate interacting appropriately with staff and peers on the unit. Pt compliant with medication administration per MD orders. Pt given education, support, and encouragement to be active in her treatment plan. Pt being monitored Q 15 minutes for safety per unit protocol, remains safe on the unit

## 2024-05-30 NOTE — Group Note (Signed)
 Date:  05/30/2024 Time:  9:09 PM  Group Topic/Focus:  Wrap-Up Group:   The focus of this group is to help patients review their daily goal of treatment and discuss progress on daily workbooks.    Participation Level:  Active  Participation Quality:  Appropriate and Attentive  Affect:  Appropriate  Cognitive:  Alert and Appropriate  Insight: Appropriate and Good  Engagement in Group:  Limited  Modes of Intervention:  Orientation  Additional Comments:     Arlester CHRISTELLA Servant 05/30/2024, 9:09 PM

## 2024-05-30 NOTE — Progress Notes (Cosign Needed Addendum)
 [facility] MD Progress Note  05/30/2024 3:14 PM Lauren Poole  MRN:  [id number]  33 year old female who presented to the ED via EMS following a suicide attempt by overdose on 04/30/24. She reported ingesting approximately 30 Aleve pills. After ingestion, the patient did not experience vomiting or other acute physical symptoms. She reports that initially she wanted to not wake up, but later became fearful and contacted a friend, who encouraged her to seek care in the ED. The patient reports she has struggled with chronic suicidal ideation for about 1 year.  She states things have been hard but declines to elaborate on specific triggers.   Subjective:  Chart reviewed, case discussed in multidisciplinary meeting, patient seen during rounds.    10/14: On interview today, patient is noted to be pleasant, calm, and cooperative. She has been compliant with Seroquel  XR 250 mg and denies adverse effects.  She denies current symptoms of anxiety or depression.  She denies SI/HI/plan and denies hallucinations.  She is able to discuss coping mechanisms, stating she has learned coping skills in group therapy.  She reports a recent stressful interaction on the unit during which she was able to keep herself calm, remove herself from the situation, and engage in another activity.  She feels she will be able to cope well with stress outside of the hospital.  She is able to discuss support system, states she is planning to stay with a friend, Norleen, upon discharge.  Per social work, ACTT is scheduled for next Tuesday.  Patient is able to discuss crisis resources.  She does not voice any concerns or complaints at this time. Social worker spoke with [facility] APS social worker, Christopher PENNER. to provide update regarding patient's discharge plans.  APS social worker endorsed plans to continue following patient on outpatient basis.  10/13: On interview today patient is noted to be pleasant, cooperative, alert and oriented.   She continues to be medication compliant, and is tolerating medication regimen well without adverse effects.  She rates depression as 0 out of 10 and anxiety as 2 out of 10 today.  She denies SI/HI/AVH.  She reports sleeping well and stable appetite.  She is able to discuss coping mechanisms, social support, and crisis resources.  She is future oriented, stating she has worked out a plan to stay with her friend, Norleen upon discharge.  Patient gives consent for provider to contact John at [phone] for discharge planning.  She does not voice any concerns or complaints today.  Per social work team, ACT team will be set up with [employer].  10/12 : On follow-up patient is alert and oriented.  They are pleasant and cooperative on exam.  They deny adverse effects of medications.  They deny SI, HI, and AVH.  They voiced no concerns or complaints at this time.  Seroquel  was lowered yesterday as patient felt too sedated throughout the day. They are doing well this morning. Discussed discharge and going to shelter they reported they want to go to their friend John's house. They are encouraged to participate in the unit milieu, but continue to remain reclusive to the room. They rate depression and anxiety at 0/10. Discussed crisis resources, and coping skills.   10/11: on interview patient is laying in bed. They note the Seroquel  is making them too tired and request a reduced dose. She denies SI/HI/AVH. Rates depression 0/10. Rates anxiety 0/10. She is reclusive to the room and does not participate in the unit milieu.   10/10: On  interview today, patient is laying in bed.  She is minimally engaged with provider today.  She is noted to have a flat affect.  She denies current SI/HI/plan and denies hallucinations.  She rates depression at 0/10 and anxiety is 0/10 today.  She has been compliant with Seroquel  extended release 300 mg nightly.  She reports tolerating this medication well without significant adverse effects,  though does note some tiredness.  10/9: On interview today, patient is noted to be resting in bed.  She is more engaging with provider today.  She rates depression 0 out of 10 and anxiety as 2 out of 10.  She denies current SI/HI/plan and denies hallucinations.  Per social work report, patient did well in group therapy today and was actively participating.  Patient reports she is feeling good for the first time in a while, was seen interacting on the unit yesterday, patient states yesterday was a good day.  She is future oriented, thinking about where she will go upon discharge, stating that staying with her ex-boyfriend is an option but would not be her first choice.  APS has accepted her case.  She reports tolerating current medication regimen well without adverse effects.  10/8: On interview today, patient is noted to be resting in bed, she is calm and cooperative.  She denies SI/HI/plan and denies hallucinations.  She has been compliant with medication.  She reports tolerating current medication regimen well without significant adverse effects, though does report some tolerable drowsiness.  She states she does not like people but when encouraged by provider to engage in unit activities, patient states she will consider this.  When asked about treatment goals, patient states she would like to not be easily triggered by things.  She states she has spent time reflecting on her triggers and how she responds to them, and would like to work on having a healthier response.  Provider discussed coping mechanisms with patient, and encouraged participation in group therapies.  When asked about discharge planning, patient states she is not sure where she will go upon hospital discharge.  Social work team working on [facility] referral.  APS has accepted patient's case.  10/7: On interview, patient is noted to be laying in bed looking out window.  She continues to minimize symptoms, rating depression 0 out of 10 and  anxiety 0/10 today. She demonstrates poor insight into mental health condition.  She denies SI/HI/plan and denies hallucinations.  She has been compliant with Seroquel  extended release 300 mg nightly and denies adverse effects.  She does not voice any physical complaints at this time.  Provider encouraged patient to engage in treatment plan and activities on the unit.  Attempted to engage patient in conversation regarding treatment goals and future planning, but patient does not participate meaningfully in conversation.   10/6: On interview, patient is noted to be laying in bed.  She continues to be guarded and engages minimally with provider.  She continues to display poor insight and flat affect.  She does not actively engage in discussion of her treatment plan and is unable to discuss her treatment goals.  She has been compliant with Seroquel  XR 300 mg at bedtime and denies adverse effects.  She has not required any behavioral PRNs since 05/17/2024.  She does not voice any physical complaints at this time.  Provider encouraged patient to participate in group therapies and milieu activities. Social work team to make [facility] referral.   10/5: On interview today patient is noted to  be laying in bed.  She continues to engage minimally in interview and displays a flat affect.  She continues to minimize mental health condition and displays poor insight.  She denies SI/HI/plan and denies hallucinations.  She declines to participate further in interview.  10/4: On interview today, patient is noted to be laying in bed and is minimally engaged in interview.  She rates depression as 2 out of 10 and anxiety is 2 out of 10 today she denies SI/HI/plan and denies hallucinations.  She continues to minimize symptoms and shows poor insight into her mental health condition.  She denies physical complaints.  She reports tolerating current medication regimen well and denies adverse effects.  She declines Seroquel  dose increase at  this time stating she will refuse it if dose is increased.  Medication trials during this admission include lithium , Abilify , and Invega .  10/3: On interview today, patient is noted to be laying in bed.  She continues to display flat affect and be minimally engaged in interview.  She reports tolerating increased dose of Seroquel  well without adverse effects.  She rates depression as a 1 out of 10 and anxiety is at a 0 out of 10 today.  She denies SI/HI/plan and denies hallucinations.  She continues to minimize symptoms and demonstrates lack of insight into her mental health condition.  Per social work team, APS report has been made and they have accepted the case.  Patient does not voice any concerns or complaints today.    10/2: On interview today, patient noted to be laying in bed, she continues to be minimally engaging during interview and is noted to have a flat affect.  She continues to lack insight into her mental health.  She required behavioral PRN yesterday after she was observed to be making gestures in the hallway and yelling at staff.  She rates depression as 3 out of 10 and anxiety at 0 out of 10 today.  She denies SI/HI/plan and denies hallucinations.  She is agreeable to Seroquel  dose increase.  10/1: On interview today, patient is noted to be laying in bed and engages minimally with the provider. She reports tolerating Seroquel  restart well without adverse effects.  Patient endorses passive SI without intent or plan.  She denies HI/plan and denies hallucinations.  Patient declines to participate in interview further.  9/30: On interview today, patient is noted to be laying in bed with a pillow covering her face.  She sits up to engage with provider.  She rates depression as a 2-3 out of 10 and anxiety as 4-5 out of 10 today.  She denies SI/HI/plan and denies hallucinations.  She is sleeping well and appetite is stable.  She states she does not like Invega  and refused dose today, stating it  has made her feel more down than even.  She requests to discontinue Invega  for this reason.  She would like to restart Seroquel , though requests to start at a lower dose than what she was previously taking which was 200 mg, stating she felt tired with 200 mg dose.  Case discussed with attending physician and social work team.  Referral to be placed to APS.  9/29: On interview today, patient is found to be laying in bed and is minimally engaging with the provider.  She shows apathy, poor insight, and is unable to discuss her treatment goals.  Patient declined LAI.  She reports some emotional blunting with Invega  but otherwise is tolerating it well.  She states she initially  found the medication beneficial in the first few days of taking it.  When asked about discharge planning, patient states she is not sure where she will go and states she does not care where she goes, [place]  When asked about support system, patient states that she does not have any friends or family.  She denies SI/HI/plan and denies hallucinations.  Patient declines to participate further in the interview.  9/28: Patient is seen for follow-up they are alert and oriented on exam.  They continue to decline LAI and indicated they will continue with the oral medication.  They continue to have poor insight into the need for medication compliance and outpatient follow-up.  They deny current SI HI and AVH.  They do not require behavioral PRNs overnight.  They have been reclusive to room.  Engaged him in discharge planning discussing options such as shelters, mother's house, or friend's house and they are reaching out to be able today it appears they will likely need to go to the shelter though.  05/13/24: Per nursing patient declined LAI this morning.  On interview she indicates she does not take any longer.  She denies SI, HI, and AVH.  She reports she is agreeable to taking the oral 3 mg dose.  She  demonstrates poor insight.  Discussed her past psych records of poor medication compliance and how long-acting injectable would remove the need for her to take medication every day.  She continues to decline despite previously agreeing.  She did not require behavioral PRNs overnight they are performing ADLs.  They voiced no concerns or complaints.  Continue with some paranoia and bizarre behavior we will continue to monitor.  05/12/24: Per follow-up they are alert and oriented. They are feeling more like themself. Still some bizarre behavior discusses that they want to fast for 7 days and are unable to explain why. Then begins discussing their current hunger. They Deny SI/HI/AVH. She does laugh inappropriately at times. Notes no dizziness this morning. Risk, benefit, and adverse effects of invega  sustenna are reviewed and pt consents to LAI. They are performing ADLS. They voice no concerns or complaints. No behavioral PRNS overnight. Act referral is being placed.   05/11/24: Per follow-up this morning they are alert and oriented.  They are pleasant and cooperative.  They continue to feel the Invega  is effective and they feel her thought processes been more linear and their mood has been more stable.  They continue to note some passive SI they deny plan or intent.  They had some dizziness this morning they are unclear on the timeframe with reference to getting the higher dose of Invega  today we will continue to monitor.  She does not appear to be internally preoccupied nor she observed to be responding to internal stimuli.  She denies HI.  Given multiple recent hospitalizations we will continue to monitor work towards setting patient up with ACT team and progress towards LAI in the coming days.  05/10/24: Patient seen for follow up. They are alert and oriented. They are pleasant and cooperative on exam. They feel that the invega  is working well and are agreeable to continued titration. They noted some passive si  today without plan or intent. Disussed medication options for depression and patient indicates she is only willing to take the Invega  at this time but that she is open to an LAI. She denies HI and AVH. Discussed ACTT services given the multiple recent hospitalizations.  Sleep and appetite are stable. Had an argument with another pt during group yesterday but feels that it was because the pt was judging them. No behavioral PRNS overnight.   05/09/24: Patient seen for follow up. They are alert and oriented. They report they are not agreeable to continuing abilify . They are not agreeable to ongoing lab test required for Lithium , will discontinue both. Discussed invega  and LAI, patient is agreeable to trial of invega  with openess to transitioning to LAI. She denies SI/HI/AVH. Notes improving sleep and stable appetite.   05/08/2024: On interview today patient is noted to be sitting up in bed.  She is more engaged in interview today.  She reports she has not started Abilify  yet but is planning to take first dose this evening.  She has started lithium  and is tolerating this well.  She endorses symptoms of depression but denies persistent anxiety.  She denies current SI/HI/plan and denies hallucinations.  She notes most recent episode of passive SI was yesterday.  She does not voice any concerns or complaints today.  She discusses desire to do something productive with her life.  05/07/2024: Per nursing report patient did not come out and remained mostly to her room.  This Clinical research associate talked with the patient and her room.  Patient reports that she got Seroquel  yesterday and still thinks sleepy because of that.  She reports that she got lithium  today in the morning for the first time and would get Abilify  tonight.  Patient continues to report having suicidal ideations.  Reports that the last time she had suicidal ideations was yesterday in the evening time and she just woke up in the morning.  Still feeling depressed and  rates her depression as 5 out of 10.  Patient reports that her normal self is when she rates her depression as 2 out of 10.  Continues to report that she has thoughts of shooting herself if she would have a gun.  But she does not have any access to guns.    05/06/2024:   per staff report patient has refused to take the Seroquel  at night yesterday.  Patient is still coming out and staying in her home.  Patient on interview reports that she came suicide attempt and reports that she had 1 previous suicide attempt this year.  Patient continues to report that she is depressed.  Patient reports that it is end of road for her.  Patient reports that she does not have access to gun but if she has done she would just get it over with.  Patient remained vague but reports that she has ongoing financial, family, life stressors.  Patient reports history consistent with bipolar 1   With manic episode lasting for 1 month but reports that these days she is not  having manic symptoms and mostly getting depressed.   9/19: On interview today patient is noted to be seated up in bed looking towards the window.  She is more engaged in interview today than she has been in recent days.   She was compliant with Seroquel  dose last night, and states she feels that it is working.  She reports tolerating medication well without adverse effects.  She is sleeping well and reports appetite as normal.  She denies SI/HI/plan and denies hallucinations.  She rates depression as a 4 out of 10 and anxiety as a 0 out of 10 today.  She does not voice any concerns or complaints today.  9/18: On interview today patient is noted  to be alert and laying in bed.  She declines to communicate with provider verbally.  Patient responds to questions during interview with a head nod or head shake.  Patient declined medication last night per nursing report.  She denies SI/HI/plan and denies hallucinations via shaking her head to indicate 'no' when asked.  She  declined to answer questions regarding sleep and appetite.  When asked if tolerating medication patient nods head to indicate 'yes'.  Patient declined to participate further in interview.  9/17: On interview today patient is noted to be laying in bed with hand covering her face.  She continues to be withdrawn and minimizes symptoms.  She denies current SI/HI/plan and denies hallucinations.  She states she is tolerating initiation of Seroquel  well.  Patient declines to answer further questions.  9/16: On interview, patient is noted to be laying in bed.  Per nursing report, she has primarily been isolated to her room since her arrival on the unit.  She endorses passive SI without current intent or plan.  She denies HI/plan and denies hallucinations.  She reports she is sleeping and eating well.  Interviewer discussed treatment options with patient to include medication.  Patient states she prefers to be restarted on Seroquel , which she has taken in the past with good response.  Patient does not voice any other complaints or concerns today.   Sleep: Good  Appetite: Good  Past Psychiatric History: see h&P Family History:  Family History  Problem Relation Age of Onset   Schizophrenia Sister    Social History:  Social History   Substance and Sexual Activity  Alcohol Use Yes   Alcohol/week: 4.0 standard drinks of alcohol   Types: 4 Glasses of wine per week     Social History   Substance and Sexual Activity  Drug Use Not Currently   Types: Marijuana    Social History   Socioeconomic History   Marital status: Single    Spouse name: Not on file   Number of children: Not on file   Years of education: Not on file   Highest education level: Not on file  Occupational History   Not on file  Tobacco Use   Smoking status: Every Day    Current packs/day: 0.50    Average packs/day: 0.5 packs/day for 6.6 years (3.3 ttl pk-yrs)    Types: Cigarettes    Start date: 10/2017   Smokeless  tobacco: Never  Vaping Use   Vaping status: Never Used  Substance and Sexual Activity   Alcohol use: Yes    Alcohol/week: 4.0 standard drinks of alcohol    Types: 4 Glasses of wine per week   Drug use: Not Currently    Types: Marijuana   Sexual activity: Yes    Birth control/protection: None  Other Topics Concern   Not on file  Social History Narrative   Not on file   Social Drivers of Health   Financial Resource Strain: Not on file  Food Insecurity: Food Insecurity Present (04/30/2024)   Hunger Vital Sign    Worried About Running Out of Food in the Last Year: Sometimes true    Ran Out of Food in the Last Year: Sometimes true  Transportation Needs: Unmet Transportation Needs (04/30/2024)   PRAPARE - Administrator, Civil Service (Medical): Yes    Lack of Transportation (Non-Medical): Yes  Physical Activity: Not on file  Stress: Not on file  Social Connections: Not on file   Past Medical History:  Past Medical History:  Diagnosis Date   Bipolar 1 disorder (HCC)    Chlamydia    Gonorrhea     Past Surgical History:  Procedure Laterality Date   NO PAST SURGERIES      Current Medications: Current Facility-Administered Medications  Medication Dose Route Frequency Provider Last Rate Last Admin   acetaminophen  (TYLENOL ) tablet 650 mg  650 mg Oral Q6H PRN Motley-Mangrum, Jadeka A, PMHNP       alum & mag hydroxide-simeth (MAALOX/MYLANTA) 200-200-20 MG/5ML suspension 30 mL  30 mL Oral Q4H PRN Motley-Mangrum, Jadeka A, PMHNP       haloperidol  (HALDOL ) tablet 5 mg  5 mg Oral TID PRN Motley-Mangrum, Jadeka A, PMHNP       And   diphenhydrAMINE  (BENADRYL ) capsule 50 mg  50 mg Oral TID PRN Motley-Mangrum, Jadeka A, PMHNP       haloperidol  lactate (HALDOL ) injection 5 mg  5 mg Intramuscular TID PRN Motley-Mangrum, Jadeka A, PMHNP       And   diphenhydrAMINE  (BENADRYL ) injection 50 mg  50 mg Intramuscular TID PRN Motley-Mangrum, Jadeka A, PMHNP       And   LORazepam   (ATIVAN ) injection 2 mg  2 mg Intramuscular TID PRN Motley-Mangrum, Jadeka A, PMHNP       haloperidol  lactate (HALDOL ) injection 10 mg  10 mg Intramuscular TID PRN Motley-Mangrum, Jadeka A, PMHNP   10 mg at 05/17/24 1233   And   diphenhydrAMINE  (BENADRYL ) injection 50 mg  50 mg Intramuscular TID PRN Motley-Mangrum, Jadeka A, PMHNP   50 mg at 05/17/24 1233   And   LORazepam  (ATIVAN ) injection 2 mg  2 mg Intramuscular TID PRN Motley-Mangrum, Jadeka A, PMHNP   2 mg at 05/17/24 1233   magnesium  hydroxide (MILK OF MAGNESIA) suspension 30 mL  30 mL Oral Daily PRN Motley-Mangrum, Jadeka A, PMHNP       QUEtiapine  (SEROQUEL  XR) 24 hr tablet 250 mg  250 mg Oral QHS Millington, Matthew E, PA-C   250 mg at 05/29/24 2059   traZODone  (DESYREL ) tablet 50 mg  50 mg Oral QHS PRN Motley-Mangrum, Jadeka A, PMHNP   50 mg at 05/14/24 2105    Lab Results:  No results found for this or any previous visit (from the past 48 hours).   Blood Alcohol level:  Lab Results  Component Value Date   [facility] <15 04/30/2024   ETH <15 04/09/2024    Metabolic Disorder Labs: Lab Results  Component Value Date   HGBA1C 5.1 10/31/2023   MPG 99.67 10/31/2023   No results found for: PROLACTIN Lab Results  Component Value Date   CHOL 136 10/31/2023   TRIG 75 10/31/2023   HDL 37 (L) 10/31/2023   CHOLHDL 3.7 10/31/2023   VLDL 15 10/31/2023   LDLCALC 84 10/31/2023    Physical Findings: AIMS:  , ,  ,  ,    CIWA:    COWS:      Musculoskeletal: Strength & Muscle Tone: within normal limits Gait & Station: normal Assets  Assets: Communication Skills  Physical Exam: Physical Exam Vitals and nursing note reviewed.  HENT:     Head: Atraumatic.  Eyes:     Extraocular Movements: Extraocular movements intact.  Pulmonary:     Effort: Pulmonary effort is normal.  Neurological:     Mental Status: She is alert and oriented to person, place, and time.    ROS Blood pressure 111/73, pulse 77, temperature (!) 97.2 F  (36.2 C), resp. rate 18,  height 5' 4 (1.626 m), weight 109.3 kg, SpO2 100%. Body mass index is 41.37 kg/m.  Diagnosis: Principal Problem:   Bipolar 1 disorder (HCC)   PLAN: Safety and Monitoring:  -- Voluntary admission to inpatient psychiatric unit for safety, stabilization and treatment  -- Daily contact with patient to assess and evaluate symptoms and progress in treatment  -- Patient's case to be discussed in multi-disciplinary team meeting  -- Observation Level : q15 minute checks  -- Vital signs:  q12 hours  -- Precautions: suicide, elopement, and assault -- Encouraged patient to participate in unit milieu and in scheduled group therapies   2. Psychiatric Diagnoses and Treatment:  Bipolar disorder   During this admission pt had trials of lithium , abilify , and invega . She initially agreed to these trials and later declined the meds was not agreeable to the lab work for lithium , and did not want LAIs of the other medication. Pt requested to be transitioned back to seroquel .   Continue Seroquel  XR 250 mg at bedtime, dose decreased on 10/11 due to sedation               The risks/benefits/side-effects/alternatives to this medication were discussed in detail with the patient and time was given for questions. The patient consents to medication trial. -- Metabolic profile and EKG monitoring obtained while on an atypical antipsychotic  -- Encouraged patient to participate in unit milieu and in scheduled group therapies                    3. Medical Issues Being Addressed:  No medical issues identified at this time.   4. Discharge Planning:  Patient was referred for ACT services with [facility]. APS has accepted case Possibly shelter or friend's house, see if act can come evaluate here.             -- Wednesday  -- Social work and case management to assist with discharge planning and identification of hospital follow-up needs prior to discharge  -- Estimated LOS: 4-5  weeks  Camelia LITTIE Lukes, PA-C 05/30/2024, 3:14 PM

## 2024-05-30 NOTE — BHH Suicide Risk Assessment (Signed)
 [facility] Discharge Suicide Risk Assessment   Principal Problem: Bipolar 1 disorder (HCC) Discharge Diagnoses: Principal Problem:   Bipolar 1 disorder (HCC)   Total Time spent with patient: 30 minutes  Musculoskeletal: Strength & Muscle Tone: within normal limits Gait & Station: normal Patient leans: N/A  Psychiatric Specialty Exam  Presentation  General Appearance:  Appropriate for Environment  Eye Contact: Fair  Speech: Clear and Coherent  Speech Volume: Normal  Handedness: Right   Mood and Affect  Mood: Euthymic  Duration of Depression Symptoms: Greater than two weeks  Affect: Appropriate   Thought Process  Thought Processes: Coherent; Linear  Descriptions of Associations:Intact  Orientation:Full (Time, Place and Person)  Thought Content:WDL  History of Schizophrenia/Schizoaffective disorder:No  Duration of Psychotic Symptoms:N/A  Hallucinations:Hallucinations: None  Ideas of Reference:None  Suicidal Thoughts:Suicidal Thoughts: No  Homicidal Thoughts:Homicidal Thoughts: No   Sensorium  Memory: Immediate Fair; Recent Fair; Remote Fair  Judgment: Fair  Insight: Fair   Art therapist  Concentration: Fair  Attention Span: Fair  Recall: [employer] of Knowledge: Fair  Language: Fair   Psychomotor Activity  Psychomotor Activity: Psychomotor Activity: Normal   Assets  Assets: Communication Skills; Social Support   Sleep  Sleep: Sleep: Good  Estimated Sleeping Duration (Last 24 Hours): 7.75-9.00 hours  Physical Exam: Physical Exam ROS Blood pressure 111/73, pulse 77, temperature (!) 97.2 F (36.2 C), resp. rate 18, height 5' 4 (1.626 m), weight 109.3 kg, SpO2 100%. Body mass index is 41.37 kg/m.  Mental Status Per Nursing Assessment::   On Admission:  Suicidal ideation indicated by patient  Demographic Factors:  Unemployed  Loss Factors: Decrease in vocational status  Historical Factors: Prior  suicide attempt  Risk Reduction Factors:   Living with another person, especially a relative  Continued Clinical Symptoms:  Bipolar Disorder:   Depressive phase  Cognitive Features That Contribute To Risk:  None    Suicide Risk:  Minimal: No identifiable suicidal ideation.  Patients presenting with no risk factors but with morbid ruminations; may be classified as minimal risk based on the severity of the depressive symptoms   Follow-up Information     [facility] Follow up.   Why: Referral has been made. Contact information: [address] [place] [address] [phone]         [facility] Follow up.   Why: Referral has been made. Contact information: [address] [place] [address] [phone]         [facility] Follow up.   Why: Referral has been made. Contact information: [address] [place] [address] [phone]         [facility] Follow up.   Why: Your appointment is scheduled for Tuesday, 06/06/24 at 2 PM. An ACT team representative will be coming to see you. You can reach out to [facility] regarding any changes at [phone]. Contact information: [address]  [address] [place] [address] [phone]                 Plan Of Care/Follow-up recommendations:  Activity:  as tolerated  Camelia LITTIE Lukes, PA-C 05/30/2024, 4:38 PM

## 2024-05-30 NOTE — Group Note (Signed)
 [facility] LCSW Group Therapy Note   Group Date: 05/30/2024 Start Time: 1300 End Time: 1400  Type of Therapy/Topic:  Group Therapy:  Feelings about Diagnosis  Participation Level:  Did Not Attend    Description of Group:    This group will allow patients to explore their thoughts and feelings about diagnoses they have received. Patients will be guided to explore their level of understanding and acceptance of these diagnoses. Facilitator will encourage patients to process their thoughts and feelings about the reactions of others to their diagnosis, and will guide patients in identifying ways to discuss their diagnosis with significant others in their lives. This group will be process-oriented, with patients participating in exploration of their own experiences as well as giving and receiving support and challenge from other group members.   Therapeutic Goals: 1. Patient will demonstrate understanding of diagnosis as evidence by identifying two or more symptoms of the disorder:  2. Patient will be able to express two feelings regarding the diagnosis 3. Patient will demonstrate ability to communicate their needs through discussion and/or role plays  Summary of Patient Progress: Patient did not attend group.    Therapeutic Modalities:   Cognitive Behavioral Therapy Brief Therapy Feelings Identification    Nadara JONELLE Fam, LCSW

## 2024-05-30 NOTE — Group Note (Signed)
 Date:  05/30/2024 Time:  11:03 AM  Group Topic/Focus:  Goals Group:   The focus of this group is to help patients establish daily goals to achieve during treatment and discuss how the patient can incorporate goal setting into their daily lives to aide in recovery.    Participation Level:  Did Not Attend   Lauren Poole 05/30/2024, 11:03 AM

## 2024-05-30 NOTE — Plan of Care (Signed)
   Problem: Education: Goal: Emotional status will improve Outcome: Progressing Goal: Mental status will improve Outcome: Progressing Goal: Verbalization of understanding the information provided will improve Outcome: Progressing   Problem: Activity: Goal: Interest or engagement in activities will improve Outcome: Progressing

## 2024-05-30 NOTE — Progress Notes (Signed)
   05/30/24 0900  Psych Admission Type (Psych Patients Only)  Admission Status Voluntary  Psychosocial Assessment  Patient Complaints None  Eye Contact Brief  Facial Expression Animated  Affect Appropriate to circumstance  Speech Logical/coherent  Interaction Assertive  Motor Activity Other (Comment) (WNL)  Appearance/Hygiene Unremarkable  Behavior Characteristics Appropriate to situation  Mood Pleasant  Aggressive Behavior  Effect No apparent injury  Thought Process  Coherency WDL  Content WDL  Delusions None reported or observed  Perception WDL  Hallucination None reported or observed  Judgment Impaired  Confusion None  Danger to Self  Current suicidal ideation? Denies  Agreement Not to Harm Self Yes  Description of Agreement verbal  Danger to Others  Danger to Others None reported or observed

## 2024-05-30 NOTE — BHH Counselor (Signed)
 CSW met with pt briefly to assist with scheduling ACTT appointment. Pt spoke on phone with Leah B regarding appointment, which was scheduled for Tuesday, 06/06/24 at [facility]. No other concerns expressed. Contact ended without incident.   Nadara SAUNDERS. Chaim, MSW, LCSW, LCAS 05/30/2024 11:25 AM

## 2024-05-30 NOTE — Plan of Care (Signed)
   Problem: Education: Goal: Knowledge of [facility] Education information/materials will improve Outcome: Progressing Goal: Emotional status will improve Outcome: Progressing Goal: Mental status will improve Outcome: Progressing Goal: Verbalization of understanding the information provided will improve Outcome: Progressing   Problem: Activity: Goal: Interest or engagement in activities will improve Outcome: Progressing Goal: Sleeping patterns will improve Outcome: Progressing   Problem: Coping: Goal: Ability to verbalize frustrations and anger appropriately will improve Outcome: Progressing Goal: Ability to demonstrate self-control will improve Outcome: Progressing

## 2024-05-30 NOTE — Group Note (Signed)
 Recreation Therapy Group Note   Group Topic:Health and Wellness  Group Date: 05/30/2024 Start Time: 1000 End Time: 1055 Facilitators: Celestia Jeoffrey BRAVO, LRT, CTRS Location: Courtyard  Group Description: [employer]. LRT and patients played games of basketball, drew with chalk, and played corn hole while outside in the courtyard while getting fresh air and sunlight. Music was being played in the background. LRT and peers conversed about different games they have played before, what they do in their free time and anything else that is on their minds. LRT encouraged pts to drink water after being outside, sweating and getting their heart rate up.  Goal Area(s) Addressed: Patient will build on frustration tolerance skills. Patients will partake in a competitive play game with peers. Patients will gain knowledge of new leisure interest/hobby.    Affect/Mood: N/A   Participation Level: Did not attend    Clinical Observations/Individualized Feedback: Patient did not attend group.   Plan: Continue to engage patient in RT group sessions 2-3x/week.   Jeoffrey BRAVO Celestia, LRT, CTRS 05/30/2024 11:37 AM

## 2024-05-31 ENCOUNTER — Other Ambulatory Visit: Payer: Self-pay

## 2024-05-31 NOTE — Progress Notes (Signed)
 Patient ID: Lauren Poole, female   DOB: 05-18-1991, 33 y.o.   MRN: [id number]  Discharge Note:  Patient denies SI/HI/AVH at this time. Discharge instructions, AVS, prescriptions, and transition record gone over with patient. Patient declined on filling out a Suicide Safety Plan. Patient agrees to comply with medication management, follow-up visit, and outpatient therapy. Patient belongings returned to patient. Patient questions and concerns addressed and answered. Patient ambulatory off unit. Patient discharged to her ex-boyfriend's house via [employer] taxicab services.

## 2024-05-31 NOTE — Progress Notes (Signed)
   05/31/24 1100  Psych Admission Type (Psych Patients Only)  Admission Status Voluntary  Psychosocial Assessment  Patient Complaints None  Eye Contact Fair  Facial Expression Anxious (patient anxious to discharge)  Affect Appropriate to circumstance  Speech Logical/coherent  Interaction Minimal  Motor Activity Slow  Appearance/Hygiene In scrubs  Behavior Characteristics Appropriate to situation  Mood Preoccupied (patient asking for a note from her last admission for court.)  Aggressive Behavior  Effect No apparent injury  Thought Process  Coherency WDL  Content WDL  Delusions None reported or observed  Perception WDL  Hallucination None reported or observed  Judgment WDL  Confusion None  Danger to Self  Current suicidal ideation? Denies  Self-Injurious Behavior No self-injurious ideation or behavior indicators observed or expressed   Agreement Not to Harm Self Yes  Description of Agreement Verbal  Danger to Others  Danger to Others None reported or observed

## 2024-05-31 NOTE — Plan of Care (Signed)

## 2024-05-31 NOTE — Discharge Summary (Signed)
 Physician Discharge Summary Note  Patient:  Lauren Poole is an 33 y.o., female MRN:  [id number] DOB:  [birth year] Patient phone:  [phone] (home)  Patient address:   [address] [place] [address],   Total time spent: 40 min Date of Admission:  04/30/2024 Date of Discharge: 05/31/2024  Reason for Admission:  Suicide attempt  Principal Problem: Bipolar 1 disorder [facility]) Discharge Diagnoses: Principal Problem:   Bipolar 1 disorder (HCC)   Past Psychiatric History: Bipolar disorder   Family Psychiatric  History: None identified  Social History:  Social History   Substance and Sexual Activity  Alcohol Use Yes   Alcohol/week: 4.0 standard drinks of alcohol   Types: 4 Glasses of wine per week     Social History   Substance and Sexual Activity  Drug Use Not Currently   Types: Marijuana    Social History   Socioeconomic History   Marital status: Single    Spouse name: Not on file   Number of children: Not on file   Years of education: Not on file   Highest education level: Not on file  Occupational History   Not on file  Tobacco Use   Smoking status: Every Day    Current packs/day: 0.50    Average packs/day: 0.5 packs/day for 6.6 years (3.3 ttl pk-yrs)    Types: Cigarettes    Start date: 10/2017   Smokeless tobacco: Never  Vaping Use   Vaping status: Never Used  Substance and Sexual Activity   Alcohol use: Yes    Alcohol/week: 4.0 standard drinks of alcohol    Types: 4 Glasses of wine per week   Drug use: Not Currently    Types: Marijuana   Sexual activity: Yes    Birth control/protection: None  Other Topics Concern   Not on file  Social History Narrative   Not on file   Social Drivers of Health   Financial Resource Strain: Not on file  Food Insecurity: Food Insecurity Present (04/30/2024)   Hunger Vital Sign    Worried About Running Out of Food in the Last Year: Sometimes true    Ran Out of Food in the Last Year:  Sometimes true  Transportation Needs: Unmet Transportation Needs (04/30/2024)   PRAPARE - Administrator, Civil Service (Medical): Yes    Lack of Transportation (Non-Medical): Yes  Physical Activity: Not on file  Stress: Not on file  Social Connections: Not on file   Past Medical History:  Past Medical History:  Diagnosis Date   Bipolar 1 disorder (HCC)    Chlamydia    Gonorrhea     Past Surgical History:  Procedure Laterality Date   NO PAST SURGERIES     Family History:  Family History  Problem Relation Age of Onset   Schizophrenia Sister     Hospital Course:   Upon admission, the patient presented with poor insight, guarded affect, minimal engagement, and passive suicidal ideation. She was reclusive, though she denied hallucinations or active SI/HI/plan. Multiple medication trials were initiated and adjusted throughout her stay. She reported early improvement with Invega  but later complained of emotional blunting and eventually requested to discontinue it. Lithium  was initiated but discontinued due to patient refusal of ongoing lab monitoring. Abilify  was briefly trialed, then discontinued due to patient preference. Seroquel  XR was restarted at patient's request; titrated to 300 mg, then decreased to 250 mg due to sedation. Patient ultimately tolerated and responded well to this regimen.  Patient  initially demonstrated limited engagement and poor insight into her illness. Over time, with medication stabilization and supportive therapy, she became more cooperative, displayed improved insight, and was future-oriented. She began participating in group therapies, reported a reduction in anxiety and depression, and denied suicidal or homicidal ideation on serial interviews.   She demonstrated the ability to identify and use coping strategies, including removing herself from stressful situations and redirecting herself. She began planning for discharge and secured a temporary  living arrangement with a friend. ACTT follow-up was arranged.  By discharge, patient was calm, cooperative, and pleasant during interviews, medication-compliant, and showed improved insight and willingness to engage in outpatient services. She voiced no concerns or complaints and demonstrated understanding of crisis resources. On date of discharge, patient was linear, logical, future oriented, and denied SI/HI/AVH.   Safe discharge planning completed with patient's identified support person, friend Norleen, with whom patient will be living. Norleen confirms he has removed any potentially lethal means from home. John does not voice any safety concerns and is agreeable to patient's discharge.  [facility] social worker will be following patient on an outpatient basis.   Detailed risk assessment is complete based on clinical exam and individual risk factors and acute suicide risk is low and acute violence risk is low.     Currently, all modifiable risk of harm to self/harm to others have been addressed and patient is no longer appropriate for the acute inpatient setting and is able to continue treatment for mental health needs in the community with the supports as indicated below.  Patient is educated and verbalized understanding of discharge plan of care including medications, follow-up appointments, mental health resources and further crisis services in the community.  She is instructed to call 911 or present to the nearest emergency room should he experience any decompensation in mood, disturbance of bowel or return of suicidal/homicidal ideations.  Patient verbalizes understanding of this education and agrees to this plan of care  Physical Findings: AIMS:  , ,  ,  ,    CIWA:    COWS:        Psychiatric Specialty Exam:  Presentation  General Appearance:  Appropriate for Environment  Eye Contact: Fair  Speech: Clear and Coherent  Speech Volume: Normal    Mood and Affect   Mood: Euthymic  Affect: Appropriate   Thought Process  Thought Processes: Coherent; Linear  Descriptions of Associations:Intact  Orientation:Full (Time, Place and Person)  Thought Content:WDL  Hallucinations:Hallucinations: None  Ideas of Reference:None  Suicidal Thoughts:Suicidal Thoughts: No  Homicidal Thoughts:Homicidal Thoughts: No   Sensorium  Memory: Immediate Fair; Recent Fair; Remote Fair  Judgment: Fair  Insight: Fair   Art therapist  Concentration: Fair  Attention Span: Fair  Recall: [employer] of Knowledge: Fair  Language: Fair   Psychomotor Activity  Psychomotor Activity: Psychomotor Activity: Normal  Musculoskeletal: Strength & Muscle Tone: within normal limits Gait & Station: normal Assets  Assets: Manufacturing systems engineer; Social Support   Sleep  Sleep: Sleep: Good    Physical Exam: Physical Exam Constitutional:      General: She is not in acute distress. HENT:     Nose: Nose normal.     Mouth/Throat:     Mouth: Mucous membranes are moist.  Eyes:     Conjunctiva/sclera: Conjunctivae normal.  Pulmonary:     Effort: Pulmonary effort is normal.  Neurological:     Mental Status: She is alert and oriented to person, place, and time.  Psychiatric:  Mood and Affect: Mood normal.        Behavior: Behavior normal.        Thought Content: Thought content normal.        Judgment: Judgment normal.    Review of Systems  Psychiatric/Behavioral:  Negative for depression, hallucinations, substance abuse and suicidal ideas. The patient is not nervous/anxious and does not have insomnia.   All other systems reviewed and are negative.  Blood pressure 119/84, pulse (!) 105, temperature (!) 97.2 F (36.2 C), resp. rate 18, height 5' 4 (1.626 m), weight 109.3 kg, SpO2 100%. Body mass index is 41.37 kg/m.   Social History   Tobacco Use  Smoking Status Every Day   Current packs/day: 0.50   Average packs/day:  0.5 packs/day for 6.6 years (3.3 ttl pk-yrs)   Types: Cigarettes   Start date: 10/2017  Smokeless Tobacco Never   Tobacco Cessation:  A prescription for an FDA-approved tobacco cessation medication was offered at discharge and the patient refused.   Blood Alcohol level:  Lab Results  Component Value Date   [facility] <15 04/30/2024   ETH <15 04/09/2024    Metabolic Disorder Labs:  Lab Results  Component Value Date   HGBA1C 5.1 10/31/2023   MPG 99.67 10/31/2023   No results found for: PROLACTIN Lab Results  Component Value Date   CHOL 136 10/31/2023   TRIG 75 10/31/2023   HDL 37 (L) 10/31/2023   CHOLHDL 3.7 10/31/2023   VLDL 15 10/31/2023   LDLCALC 84 10/31/2023    See Psychiatric Specialty Exam and Suicide Risk Assessment completed by Attending Physician prior to discharge.  Discharge destination:  Home  Is patient on multiple antipsychotic therapies at discharge:  No   Has Patient had three or more failed trials of antipsychotic monotherapy by history:  No  Recommended Plan for Multiple Antipsychotic Therapies: NA  Discharge Instructions     Diet - low sodium heart healthy   Complete by: As directed    Increase activity slowly   Complete by: As directed       Allergies as of 05/31/2024   No Known Allergies      Medication List     STOP taking these medications    naproxen sodium 220 MG tablet Commonly known as: ALEVE   traZODone  100 MG tablet Commonly known as: DESYREL    vitamin D3 25 MCG tablet Commonly known as: CHOLECALCIFEROL        TAKE these medications      Indication  QUEtiapine  50 MG Tb24 24 hr tablet Commonly known as: SEROQUEL  XR Take 5 tablets (250 mg total) by mouth at bedtime.  Indication: Manic-Depression        Follow-up Information     [facility] Follow up.   Why: Referral has been made. Contact information: [address] [place] [address] [phone]         [facility] Follow up.   Why: Referral has been made. Contact information: [address] [place] [address] [phone]         [facility] Follow up.   Why: Referral has been made. Contact information: [address] [place] [address] [phone]         [facility] Follow up.   Why: Your appointment is scheduled for Tuesday, 06/06/24 at 2 PM. An ACT team representative will be coming to see you. You can reach out to [facility] regarding any changes at [phone]. Contact information:  [address]  [address] [place] [address] [phone]                 Follow-up recommendations:  Activity:  as tolerated    Signed: Camelia LITTIE Lukes, PA-C 05/31/2024, 9:27 AM

## 2024-05-31 NOTE — Group Note (Signed)
 Date:  05/31/2024 Time:  1:10 PM  Group Topic/Focus:  Emotional Education:   The focus of this group is to discuss what feelings/emotions are, and how they are experienced.    Participation Level:  Active  Participation Quality:  Appropriate  Affect:  Appropriate  Cognitive:  Appropriate  Insight: Appropriate  Engagement in Group:  Engaged  Modes of Intervention:  Activity  Additional Comments:    Lauren Poole 05/31/2024, 1:10 PM

## 2024-05-31 NOTE — Progress Notes (Signed)
  [facility] Adult Case Management Discharge Plan :  Will you be returning to the same living situation after discharge:  Yes,  pt plans to return to her ex-boyfriend's home.  At discharge, do you have transportation home?: Yes,  pt was given taxi voucher.  Do you have the ability to pay for your medications: Yes,  [employer]  Release of information consent forms completed and in the chart;  Patient's signature needed at discharge.  Patient to Follow up at:  Follow-up Information     [facility] Follow up.   Why: Referral has been made. Contact information: [address] [place] [address] [phone]         [facility] Follow up.   Why: Referral has been made. Contact information: [address] [place] [address] [phone]         [facility] Follow up.   Why: Referral has been made. Contact information: [address] [place] [address] [phone]         [facility] Follow up.   Why: Your appointment is scheduled for Tuesday, 06/06/24 at 2 PM. An ACT team representative will be coming to see you. You can reach out to [facility] regarding any changes at [phone]. Contact information: [address]  [address] [place] [address] [phone]                 Next level of care provider has access to [facility] Link:no  Safety Planning and Suicide Prevention discussed: Yes,  SPE completed with ex-partner, Ethel Acre.     Has patient been referred to the Quitline?: Patient refused referral for treatment  Patient has been referred for addiction treatment: Patient refused referral for treatment.  Nadara JONELLE Fam, LCSW 05/31/2024, 10:41 AM

## 2024-05-31 NOTE — Progress Notes (Signed)
 Patient was very appreciate of receiving a medication supply for discharge, that was very sweet of them.
